# Patient Record
Sex: Male | Born: 1945
Health system: Southern US, Community
[De-identification: ages and names within clinical notes are randomized; demographics above are authoritative.]

## PROBLEM LIST (undated history)

## (undated) DIAGNOSIS — M4800 Spinal stenosis, site unspecified: Secondary | ICD-10-CM

## (undated) DIAGNOSIS — E785 Hyperlipidemia, unspecified: Secondary | ICD-10-CM

## (undated) DIAGNOSIS — M5136 Other intervertebral disc degeneration, lumbar region: Secondary | ICD-10-CM

## (undated) DIAGNOSIS — E119 Type 2 diabetes mellitus without complications: Secondary | ICD-10-CM

## (undated) DIAGNOSIS — K603 Anal fistula, unspecified: Secondary | ICD-10-CM

## (undated) DIAGNOSIS — M810 Age-related osteoporosis without current pathological fracture: Secondary | ICD-10-CM

## (undated) DIAGNOSIS — D649 Anemia, unspecified: Secondary | ICD-10-CM

## (undated) DIAGNOSIS — Z8701 Personal history of pneumonia (recurrent): Secondary | ICD-10-CM

## (undated) DIAGNOSIS — T4145XA Adverse effect of unspecified anesthetic, initial encounter: Secondary | ICD-10-CM

## (undated) DIAGNOSIS — M419 Scoliosis, unspecified: Secondary | ICD-10-CM

## (undated) DIAGNOSIS — K589 Irritable bowel syndrome without diarrhea: Secondary | ICD-10-CM

## (undated) DIAGNOSIS — E039 Hypothyroidism, unspecified: Secondary | ICD-10-CM

## (undated) DIAGNOSIS — F419 Anxiety disorder, unspecified: Secondary | ICD-10-CM

## (undated) DIAGNOSIS — I10 Essential (primary) hypertension: Secondary | ICD-10-CM

## (undated) DIAGNOSIS — M109 Gout, unspecified: Secondary | ICD-10-CM

## (undated) DIAGNOSIS — I2510 Atherosclerotic heart disease of native coronary artery without angina pectoris: Secondary | ICD-10-CM

## (undated) DIAGNOSIS — F4310 Post-traumatic stress disorder, unspecified: Secondary | ICD-10-CM

## (undated) DIAGNOSIS — M1990 Unspecified osteoarthritis, unspecified site: Secondary | ICD-10-CM

## (undated) DIAGNOSIS — K219 Gastro-esophageal reflux disease without esophagitis: Secondary | ICD-10-CM

## (undated) DIAGNOSIS — I509 Heart failure, unspecified: Secondary | ICD-10-CM

## (undated) DIAGNOSIS — C801 Malignant (primary) neoplasm, unspecified: Secondary | ICD-10-CM

## (undated) DIAGNOSIS — K5792 Diverticulitis of intestine, part unspecified, without perforation or abscess without bleeding: Secondary | ICD-10-CM

## (undated) DIAGNOSIS — M51369 Other intervertebral disc degeneration, lumbar region without mention of lumbar back pain or lower extremity pain: Secondary | ICD-10-CM

## (undated) DIAGNOSIS — T8859XA Other complications of anesthesia, initial encounter: Secondary | ICD-10-CM

## (undated) HISTORY — PX: STERNAL WIRE REMOVAL: SHX2440

## (undated) HISTORY — DX: Irritable bowel syndrome, unspecified: K58.9

## (undated) HISTORY — PX: HAND SURGERY: SHX662

## (undated) HISTORY — DX: Unspecified osteoarthritis, unspecified site: M19.90

## (undated) HISTORY — DX: Anal fistula, unspecified: K60.30

## (undated) HISTORY — DX: Anal fistula: K60.3

## (undated) HISTORY — PX: CHOLECYSTECTOMY: SHX55

## (undated) HISTORY — PX: CERVICAL FUSION: SHX112

## (undated) HISTORY — PX: TREATMENT FISTULA ANAL: SUR1390

## (undated) HISTORY — DX: Gout, unspecified: M10.9

## (undated) HISTORY — DX: Gastro-esophageal reflux disease without esophagitis: K21.9

## (undated) HISTORY — PX: BLEPHAROPLASTY: SUR158

## (undated) HISTORY — PX: JOINT REPLACEMENT: SHX530

## (undated) HISTORY — DX: Essential (primary) hypertension: I10

## (undated) HISTORY — PX: APPENDECTOMY: SHX54

## (undated) HISTORY — DX: Anemia, unspecified: D64.9

## (undated) HISTORY — PX: CORONARY ARTERY BYPASS GRAFT: SHX141

## (undated) HISTORY — PX: PLACEMENT OF SETON: SHX6029

## (undated) HISTORY — DX: Hyperlipidemia, unspecified: E78.5

## (undated) HISTORY — PX: KNEE ARTHROSCOPY: SUR90

## (undated) HISTORY — DX: Atherosclerotic heart disease of native coronary artery without angina pectoris: I25.10

---

## 1997-06-18 ENCOUNTER — Ambulatory Visit (HOSPITAL_COMMUNITY): Admission: RE | Admit: 1997-06-18 | Discharge: 1997-06-18 | Payer: Self-pay | Admitting: *Deleted

## 1999-03-22 ENCOUNTER — Ambulatory Visit (HOSPITAL_COMMUNITY): Admission: RE | Admit: 1999-03-22 | Discharge: 1999-03-22 | Payer: Self-pay | Admitting: Neurosurgery

## 1999-04-13 ENCOUNTER — Ambulatory Visit (HOSPITAL_COMMUNITY): Admission: RE | Admit: 1999-04-13 | Discharge: 1999-04-13 | Payer: Self-pay | Admitting: Neurosurgery

## 1999-04-13 ENCOUNTER — Encounter: Payer: Self-pay | Admitting: Neurosurgery

## 1999-05-30 ENCOUNTER — Inpatient Hospital Stay (HOSPITAL_COMMUNITY): Admission: EM | Admit: 1999-05-30 | Discharge: 1999-05-31 | Payer: Self-pay | Admitting: Emergency Medicine

## 1999-05-30 ENCOUNTER — Encounter: Payer: Self-pay | Admitting: Emergency Medicine

## 1999-05-31 ENCOUNTER — Encounter: Payer: Self-pay | Admitting: *Deleted

## 1999-06-05 ENCOUNTER — Ambulatory Visit (HOSPITAL_COMMUNITY): Admission: RE | Admit: 1999-06-05 | Discharge: 1999-06-05 | Payer: Self-pay | Admitting: Cardiology

## 1999-07-02 ENCOUNTER — Encounter: Payer: Self-pay | Admitting: Surgery

## 1999-07-04 ENCOUNTER — Encounter: Payer: Self-pay | Admitting: Surgery

## 1999-07-04 ENCOUNTER — Inpatient Hospital Stay (HOSPITAL_COMMUNITY): Admission: RE | Admit: 1999-07-04 | Discharge: 1999-07-08 | Payer: Self-pay | Admitting: Surgery

## 1999-07-05 ENCOUNTER — Encounter: Payer: Self-pay | Admitting: Thoracic Surgery (Cardiothoracic Vascular Surgery)

## 1999-07-05 ENCOUNTER — Encounter: Payer: Self-pay | Admitting: Surgery

## 1999-07-06 ENCOUNTER — Encounter: Payer: Self-pay | Admitting: Thoracic Surgery (Cardiothoracic Vascular Surgery)

## 1999-08-12 ENCOUNTER — Encounter (HOSPITAL_COMMUNITY): Admission: RE | Admit: 1999-08-12 | Discharge: 1999-11-10 | Payer: Self-pay | Admitting: Cardiology

## 1999-09-30 ENCOUNTER — Encounter: Payer: Self-pay | Admitting: Surgery

## 1999-09-30 ENCOUNTER — Encounter: Admission: RE | Admit: 1999-09-30 | Discharge: 1999-09-30 | Payer: Self-pay | Admitting: Surgery

## 1999-11-11 ENCOUNTER — Encounter: Payer: Self-pay | Admitting: Surgery

## 1999-11-11 ENCOUNTER — Encounter: Admission: RE | Admit: 1999-11-11 | Discharge: 1999-11-11 | Payer: Self-pay | Admitting: Surgery

## 1999-11-14 ENCOUNTER — Encounter: Payer: Self-pay | Admitting: Surgery

## 1999-11-27 ENCOUNTER — Inpatient Hospital Stay (HOSPITAL_COMMUNITY): Admission: RE | Admit: 1999-11-27 | Discharge: 1999-11-30 | Payer: Self-pay | Admitting: Surgery

## 1999-11-27 ENCOUNTER — Encounter: Payer: Self-pay | Admitting: Surgery

## 1999-11-28 ENCOUNTER — Encounter: Payer: Self-pay | Admitting: Surgery

## 1999-11-29 ENCOUNTER — Encounter: Payer: Self-pay | Admitting: Surgery

## 1999-12-16 ENCOUNTER — Encounter: Payer: Self-pay | Admitting: Surgery

## 1999-12-16 ENCOUNTER — Encounter: Admission: RE | Admit: 1999-12-16 | Discharge: 1999-12-16 | Payer: Self-pay | Admitting: Surgery

## 2000-01-30 ENCOUNTER — Ambulatory Visit (HOSPITAL_COMMUNITY): Admission: RE | Admit: 2000-01-30 | Discharge: 2000-01-30 | Payer: Self-pay | Admitting: *Deleted

## 2000-09-03 ENCOUNTER — Ambulatory Visit (HOSPITAL_COMMUNITY): Admission: RE | Admit: 2000-09-03 | Discharge: 2000-09-03 | Payer: Self-pay | Admitting: *Deleted

## 2001-01-07 ENCOUNTER — Ambulatory Visit (HOSPITAL_COMMUNITY): Admission: RE | Admit: 2001-01-07 | Discharge: 2001-01-07 | Payer: Self-pay | Admitting: Orthopedic Surgery

## 2001-04-29 ENCOUNTER — Ambulatory Visit (HOSPITAL_COMMUNITY): Admission: RE | Admit: 2001-04-29 | Discharge: 2001-04-29 | Payer: Self-pay | Admitting: Cardiology

## 2001-04-29 ENCOUNTER — Encounter: Payer: Self-pay | Admitting: Cardiology

## 2002-05-18 ENCOUNTER — Encounter: Admission: RE | Admit: 2002-05-18 | Discharge: 2002-05-18 | Payer: Self-pay | Admitting: Family Medicine

## 2002-05-18 ENCOUNTER — Encounter: Payer: Self-pay | Admitting: Family Medicine

## 2002-11-23 ENCOUNTER — Encounter: Admission: RE | Admit: 2002-11-23 | Discharge: 2002-11-23 | Payer: Self-pay | Admitting: Family Medicine

## 2002-11-23 ENCOUNTER — Encounter: Payer: Self-pay | Admitting: Family Medicine

## 2004-06-13 ENCOUNTER — Encounter: Admission: RE | Admit: 2004-06-13 | Discharge: 2004-06-13 | Payer: Self-pay | Admitting: Internal Medicine

## 2004-10-16 ENCOUNTER — Encounter (INDEPENDENT_AMBULATORY_CARE_PROVIDER_SITE_OTHER): Payer: Self-pay | Admitting: *Deleted

## 2004-10-16 ENCOUNTER — Ambulatory Visit (HOSPITAL_COMMUNITY): Admission: RE | Admit: 2004-10-16 | Discharge: 2004-10-16 | Payer: Self-pay | Admitting: *Deleted

## 2006-05-07 ENCOUNTER — Emergency Department (HOSPITAL_COMMUNITY): Admission: EM | Admit: 2006-05-07 | Discharge: 2006-05-07 | Payer: Self-pay | Admitting: Emergency Medicine

## 2006-10-18 ENCOUNTER — Inpatient Hospital Stay (HOSPITAL_COMMUNITY): Admission: EM | Admit: 2006-10-18 | Discharge: 2006-10-21 | Payer: Self-pay | Admitting: Emergency Medicine

## 2007-02-04 ENCOUNTER — Ambulatory Visit (HOSPITAL_BASED_OUTPATIENT_CLINIC_OR_DEPARTMENT_OTHER): Admission: RE | Admit: 2007-02-04 | Discharge: 2007-02-04 | Payer: Self-pay | Admitting: General Surgery

## 2007-03-18 ENCOUNTER — Encounter: Admission: RE | Admit: 2007-03-18 | Discharge: 2007-03-18 | Payer: Self-pay | Admitting: General Surgery

## 2007-04-26 ENCOUNTER — Inpatient Hospital Stay (HOSPITAL_BASED_OUTPATIENT_CLINIC_OR_DEPARTMENT_OTHER): Admission: RE | Admit: 2007-04-26 | Discharge: 2007-04-26 | Payer: Self-pay | Admitting: Cardiology

## 2007-07-08 ENCOUNTER — Ambulatory Visit (HOSPITAL_BASED_OUTPATIENT_CLINIC_OR_DEPARTMENT_OTHER): Admission: RE | Admit: 2007-07-08 | Discharge: 2007-07-08 | Payer: Self-pay | Admitting: General Surgery

## 2008-12-04 ENCOUNTER — Encounter: Admission: RE | Admit: 2008-12-04 | Discharge: 2008-12-04 | Payer: Self-pay | Admitting: Neurosurgery

## 2009-03-01 ENCOUNTER — Ambulatory Visit (HOSPITAL_BASED_OUTPATIENT_CLINIC_OR_DEPARTMENT_OTHER): Admission: RE | Admit: 2009-03-01 | Discharge: 2009-03-01 | Payer: Self-pay | Admitting: Orthopedic Surgery

## 2009-07-13 ENCOUNTER — Encounter: Admission: RE | Admit: 2009-07-13 | Discharge: 2009-07-13 | Payer: Self-pay | Admitting: Orthopedic Surgery

## 2009-08-07 ENCOUNTER — Inpatient Hospital Stay (HOSPITAL_COMMUNITY): Admission: RE | Admit: 2009-08-07 | Discharge: 2009-08-10 | Payer: Self-pay | Admitting: Orthopedic Surgery

## 2009-09-14 ENCOUNTER — Encounter (INDEPENDENT_AMBULATORY_CARE_PROVIDER_SITE_OTHER): Payer: Self-pay | Admitting: Orthopedic Surgery

## 2009-09-14 ENCOUNTER — Ambulatory Visit (HOSPITAL_COMMUNITY): Admission: RE | Admit: 2009-09-14 | Discharge: 2009-09-14 | Payer: Self-pay | Admitting: Orthopedic Surgery

## 2009-09-14 ENCOUNTER — Ambulatory Visit: Payer: Self-pay | Admitting: Vascular Surgery

## 2010-04-10 ENCOUNTER — Ambulatory Visit (INDEPENDENT_AMBULATORY_CARE_PROVIDER_SITE_OTHER): Payer: Medicare Other | Admitting: Cardiology

## 2010-04-10 DIAGNOSIS — I10 Essential (primary) hypertension: Secondary | ICD-10-CM

## 2010-04-25 ENCOUNTER — Ambulatory Visit: Payer: Self-pay | Admitting: Cardiology

## 2010-05-04 LAB — BASIC METABOLIC PANEL
BUN: 14 mg/dL (ref 6–23)
CO2: 28 mEq/L (ref 19–32)
Calcium: 9.1 mg/dL (ref 8.4–10.5)
GFR calc Af Amer: >60 mL/min (ref >60)
Glucose, Bld: 116 mg/dL — ABNORMAL HIGH (ref 70–99)

## 2010-05-04 LAB — POCT HEMOGLOBIN-HEMACUE: Hemoglobin: 13.4 g/dL (ref 13.0–17.0)

## 2010-05-05 LAB — CBC
HCT: 43.3 % (ref 39.0–52.0)
Hemoglobin: 12 g/dL — ABNORMAL LOW (ref 13.0–17.0)
MCH: 30.1 pg (ref 26.0–34.0)
MCHC: 33.2 g/dL (ref 30.0–36.0)
MCHC: 33.9 g/dL (ref 30.0–36.0)
MCV: 88.7 fL (ref 78.0–100.0)
MCV: 88.8 fL (ref 78.0–100.0)
MCV: 89.6 fL (ref 78.0–100.0)
Platelets: 248 10*3/uL (ref 150–400)
Platelets: 256 10*3/uL (ref 150–400)
Platelets: 294 10*3/uL (ref 150–400)
Platelets: 305 10*3/uL (ref 150–400)
RBC: 3.98 MIL/uL — ABNORMAL LOW (ref 4.22–5.81)
RBC: 4.08 MIL/uL — ABNORMAL LOW (ref 4.22–5.81)
RBC: 4.09 MIL/uL — ABNORMAL LOW (ref 4.22–5.81)
RDW: 16.2 % — ABNORMAL HIGH (ref 11.5–15.5)
RDW: 16.4 % — ABNORMAL HIGH (ref 11.5–15.5)
WBC: 11.7 10*3/uL — ABNORMAL HIGH (ref 4.0–10.5)

## 2010-05-05 LAB — DIFFERENTIAL
Basophils Absolute: 0.1 10*3/uL (ref 0.0–0.1)
Basophils Relative: 1 % (ref 0–1)
Monocytes Absolute: 0.9 10*3/uL (ref 0.1–1.0)
Monocytes Relative: 9 % (ref 3–12)
Neutro Abs: 5.7 10*3/uL (ref 1.7–7.7)

## 2010-05-05 LAB — BASIC METABOLIC PANEL
BUN: 4 mg/dL — ABNORMAL LOW (ref 6–23)
CO2: 29 mEq/L (ref 19–32)
CO2: 34 mEq/L — ABNORMAL HIGH (ref 19–32)
Calcium: 9 mg/dL (ref 8.4–10.5)
Calcium: 9.2 mg/dL (ref 8.4–10.5)
Chloride: 90 mEq/L — ABNORMAL LOW (ref 96–112)
Chloride: 92 mEq/L — ABNORMAL LOW (ref 96–112)
Creatinine, Ser: 1.22 mg/dL (ref 0.4–1.5)
GFR calc Af Amer: >60 mL/min (ref >60)
GFR calc Af Amer: >60 mL/min (ref >60)
GFR calc Af Amer: >60 mL/min (ref >60)
GFR calc non Af Amer: 57 mL/min — ABNORMAL LOW (ref >60)
GFR calc non Af Amer: 60 mL/min — ABNORMAL LOW (ref >60)
Glucose, Bld: 124 mg/dL — ABNORMAL HIGH (ref 70–99)
Potassium: 4.5 mEq/L (ref 3.5–5.1)
Sodium: 138 mEq/L (ref 135–145)

## 2010-05-05 LAB — PROTIME-INR
INR: 0.93 (ref 0.00–1.49)
INR: 1.16 (ref 0.00–1.49)
Prothrombin Time: 12.4 seconds (ref 11.6–15.2)

## 2010-05-05 LAB — COMPREHENSIVE METABOLIC PANEL
ALT: 22 U/L (ref 0–53)
Calcium: 9.6 mg/dL (ref 8.4–10.5)
Creatinine, Ser: 1.21 mg/dL (ref 0.4–1.5)
GFR calc Af Amer: >60 mL/min (ref >60)
GFR calc non Af Amer: >60 mL/min (ref >60)
Glucose, Bld: 129 mg/dL — ABNORMAL HIGH (ref 70–99)
Potassium: 3.9 mEq/L (ref 3.5–5.1)
Sodium: 134 mEq/L — ABNORMAL LOW (ref 135–145)
Total Protein: 6.8 g/dL (ref 6.0–8.3)

## 2010-05-05 LAB — URINE MICROSCOPIC-ADD ON

## 2010-05-05 LAB — URINALYSIS, ROUTINE W REFLEX MICROSCOPIC
Glucose, UA: NEGATIVE mg/dL
Hgb urine dipstick: NEGATIVE
Leukocytes, UA: NEGATIVE
Specific Gravity, Urine: 1.013 (ref 1.005–1.030)
pH: 6.5 (ref 5.0–8.0)

## 2010-05-19 ENCOUNTER — Other Ambulatory Visit: Payer: Self-pay | Admitting: Neurosurgery

## 2010-05-19 DIAGNOSIS — M47816 Spondylosis without myelopathy or radiculopathy, lumbar region: Secondary | ICD-10-CM

## 2010-05-20 ENCOUNTER — Ambulatory Visit
Admission: RE | Admit: 2010-05-20 | Discharge: 2010-05-20 | Disposition: A | Discharge status: Home / Self Care | Payer: Medicare Other | Source: Ambulatory Visit | Attending: Neurosurgery | Admitting: Neurosurgery

## 2010-05-20 DIAGNOSIS — M47816 Spondylosis without myelopathy or radiculopathy, lumbar region: Secondary | ICD-10-CM

## 2010-06-03 ENCOUNTER — Encounter (HOSPITAL_COMMUNITY): Payer: Medicare Other

## 2010-07-01 NOTE — Op Note (Signed)
NAME:  [name] [name], [name] [name]                  ACCOUNT NO.:  [id number]   MEDICAL RECORD NO.:  [id number]          PATIENT TYPE:  AMB   LOCATION:  [facility]                         FACILITY:  [facility]   PHYSICIAN:  Angelia Mould. Derrell Lolling, M.D.DATE OF BIRTH:  [birth year]   DATE OF PROCEDURE:  07/08/2007  DATE OF DISCHARGE:                               OPERATIVE REPORT   PREOPERATIVE DIAGNOSIS:  Recurrent fistula in ano.   POSTOPERATIVE DIAGNOSIS:  Recurrent fistula in ano.   OPERATION PERFORMED:  1. Examination under anesthesia.  2. Partial fistulotomy, left lateral.   SURGEON:  Angelia Mould. Derrell Lolling, M.D.   OPERATIVE INDICATIONS:  This is a 65 year old white man with multiple  medical problems.  He has had 2 or 3 significant perirectal abscesses of  the left side.  After the most recent one he developed a persistent  fistula in the left lateral position.  He was taken to the operating  room and this was found to track fairly high up around the external  sphincter muscles and so I partially divided the fistula and placed a  seton and left that in place for about 6 weeks.  We then removed the  seton and he transiently healed but will intermittently open up an  external fistula in the left lateral position at the most lateral part  of the incision.  In the office for the past 2 visits I have been able  to pass a probe from the external opening up posteriorly and in  curvilinear fashion.  It appears to go back toward the posterior midline  but appeared to be relatively superficial.  I advised him that we should  examine this in the operating room and if is fairly superficial and does  not divide the sphincter muscles, we should try to fully divide this in  hopes of getting pertinent healing.  He has had a GI workup and there is  no evidence of any inflammatory bowel disease.  He is brought to the  operating room electively.   OPERATIVE TECHNIQUE:  Following the induction of general endotracheal  anesthesia  the patient was placed in the dorsal lithotomy position.  Intravenous antibiotics were given.  The patient was identified as to  correct patient and correct procedure.  The perianal area was prepped  and draped in a sterile fashion.   I examined the external anatomy.  There was a transverse incision, left  lateral.  There was no active drainage anywhere.  There was a small  punctate area where the external opening was.   I did a digital rectal exam and felt some chronic scar tissue and  granulation tissue in the posterior midline, presumably from his  previous fistulotomy site.  I inserted the anoscope.  I did not see any  internal opening or any purulence.   I then passed a small blunt-tipped rectal probe through the external  opening and the only tract I could find actually went in a curvilinear  fashion and went very deep and curved posteriorly and then back toward  the wall of the rectum well above all  of the sphincter muscles.  I could  not find an internal opening.  I tried for 10 or 15 minutes to see if I  could find a tract.  I opened the skin somewhat and debrided all of the  external tissues to get good drainage.  There was no purulence or  abscess.  I tried to pass a larger probe and could not pass this and it  would not find the fistula tract.  There was no superficial fistula  tract.  I felt that it would be unwise to blindly place another seton.  I simply controlled all the bleeding and irrigated the wound and placed  a piece of Surgicel gauze.  I also debrided some of the chronic  granulation tissue in the posterior midline at the anal canal at the  dentate line.  I once again looked for any bleeding internally and there  was none.  I looked for purulent drainage internally, could find no  internal drainage site.  I felt that this was all that was safe to do at  this time and placed external bandages.  The patient was taken to the  recovery room in stable condition.   Estimated blood loss was about 20  mL.  Complications:  None.  Sponge, needle and instrument counts were  correct.      Angelia Mould. Derrell Lolling, M.D.  Electronically Signed     HMI/MEDQ  D:  07/08/2007  T:  07/08/2007  Job:  [id number]

## 2010-07-01 NOTE — Op Note (Signed)
NAME:  [name] [name], [name] [name]                  ACCOUNT NO.:  [id number]   MEDICAL RECORD NO.:  [id number]          PATIENT TYPE:  AMB   LOCATION:  [facility]                         FACILITY:  [facility]   PHYSICIAN:  Angelia Mould. Derrell Lolling, M.D.DATE OF BIRTH:  [birth year]   DATE OF PROCEDURE:  02/04/2007  DATE OF DISCHARGE:                               OPERATIVE REPORT   PREOPERATIVE DIAGNOSIS:  Fistula in ano.   POSTOPERATIVE DIAGNOSIS:  Fistula in ano, suspect extra sphincteric.   OPERATION PERFORMED:  Anoscopy, partial division fistula in anal,  placement of seton.   SURGEON:  Angelia Mould. Derrell Lolling, M.D.   OPERATIVE INDICATIONS:  This is a very pleasant 65 year old white man  who has had 2 perirectal abscesses in the past.  The last one was a very  large left-sided ischial rectal abscess on October 18, 2006, that  required a 3 or 4 day hospitalization because of the extent of the  abscess and the extent of his pain.  This healed except he has been left  with a draining sinus on the left lateral area.  In the office I could  place a probe at least 4 cm or more back in to this.  There was no  cellulitis.  He is brought to the operating room for examination under  anesthesia and hopefully repair of his fistula in ano.   OPERATIVE TECHNIQUE:  Following the induction of general endotracheal  anesthesia the patient was placed in the dorsal lithotomy position.  Perianal area was prepped and draped in a sterile fashion.  Anoscopy was  performed and I saw no active inflammatory process in the anal canal or  distal rectum.  I saw no purulent drainage.  There appeared to be a  slightly reddened area in the posterior midline about 1.5 cm or more  above the dentate line.  I then examined the transverse incision on the  left side.  There was a small punctate opening.  I passed a probe into  this and it very easily fell into a fistula track about 6 or 7 cm but  this went deep up into the ischial rectal space and then  seemed to come  back to the posterior midline about 1.5 to 2 cm above the dentate line.  I examined this for a long time to make sure that there were no other  tracts.  I opened this up part way including a little bit of the  internal sphincter but it was so deep that I could not really open all  of this up and so I debrided as much as I could and then passed a zero  Surgilon seton around the fistula tract.  I divided the mucosa a little  bit.  I tied the seton down loosely and left the string long so that we  could retrieve it later in the office.  There was no purulence or  abscess.  Hemostasis was excellent and achieved with electrocautery.  We  placed a small piece of Surgicel 20 mL.  Complications none.  Sponge and  instrument counts were correct.  Angelia Mould. Derrell Lolling, M.D.  Electronically Signed    HMI/MEDQ  D:  02/04/2007  T:  02/05/2007  Job:  [id number]   cc:   Juline Patch, M.D.  Fax: [phone]

## 2010-07-01 NOTE — H&P (Signed)
NAME:  [name] [name], [name] [name]                  ACCOUNT NO.:  [id number]   MEDICAL RECORD NO.:  [id number]          PATIENT TYPE:  INP   LOCATION:  0098                         FACILITY:  [facility]   PHYSICIAN:  Angelia Mould. Derrell Lolling, M.D.DATE OF BIRTH:  1945-04-17   DATE OF ADMISSION:  10/18/2006  DATE OF DISCHARGE:                              HISTORY & PHYSICAL   CHIEF COMPLAINT:  Progressive pain and swelling in the perirectal area.   HISTORY OF PRESENT ILLNESS:  This is a 65 year old white man who  underwent incision and drainage of a left-sided perirectal abscess in  the emergency room by Dr. Ovidio Kin in March of this year.  He saw  Dr. Dominga Ferry in the office a few days later, and according to him had  another drainage procedure.  This then resolved and he has felt fine.  Four days ago he began feeling some tingling and then has had  progressive pain and swelling.  He has not had any drainage.  No real  fever or chills.  He called on the phone today and I asked him to come  to the emergency room.   PAST HISTORY:  Coronary artery bypass grafting 2001.  Rewiring of his  sternum in October of 2001.  Cervical discectomy in the 1990s by Dr.  Channing Mutters.  Appendectomy.  Laparoscopic cholecystectomy Dr. Chelsea Aus.  Hemorrhoidectomy.  Knee arthroscopy.  Hand surgery for cellulitis.  Incision and drainage perirectal abscess March of 2008.  He has coronary  artery disease, degenerative joint disease, gout, and gastroesophageal  reflux disease.   CURRENT MEDICATIONS:  Nexium 40 mg a day, allopurinol 300 mg a day,  Cozaar 50 mg a day, vitamin B6.  Vitamin B12.  Folic acid.  Magnesium  oxide.  Omega III.  Iron.  Toprol 50 milligrams a day.  Niaspan 500 mg a  day.  Aspirin 325 mg a day.  Hydrocodone p.r.n.   ALLERGIES:  Drug allergies none known.   SOCIAL HISTORY:  He lives in [place].  His wife Jonaven Hilgers is an OR nurse at [employer]  They have two children.  Denies  tobacco.  Drinks a six-pack of beer a day.   FAMILY HISTORY:  Mother died age 73 of colon cancer.  Father died  unknown cause.   REVIEW OF SYMPTOMS:  15 system review of systems is performed and  noncontributory, except as described above.   PHYSICAL EXAMINATION:  GENERAL:  Pleasant middle-aged gentleman in  moderate distress.  He has to lie on his side because of pain.  VITAL SIGNS:  Are pending at this time.  HEENT:  Eyes, sclerae clear.  Extraocular movements intact.  Ears, nose,  mouth and throat, nose, tongue and oropharynx are without gross lesions.  NECK:  Supple, nontender.  I do not feel any adenopathy.  I do not feel  any jugular venous distention.  He has a well-healed transverse scar in  the left lower neck.  LUNGS:  Clear to auscultation.  No chest wall tenderness.  Well-healed  sternotomy scar.  HEART:  Regular rate and rhythm.  No murmur.  Radial and femoral pulses  are palpable.  ABDOMEN:  Soft.  Nontender.  Well-healed trocar sites from his  cholecystectomy.  Liver and spleen not enlarged.  GENITOURINARY:  Penis, scrotum, testes and inguinal areas feel normal.  RECTAL:  He has a large area of induration in the left perirectal area.  I can see a radially oriented scar in the left lateral position which  appears well healed.  The area of induration is quite large and is 15 cm  x 10 cm.  I do not feel a specific area of pointing or fluctuance but I  suspect that he has an abscess deep inside.  The right side looks  normal.  There is no drainage.  There is no skin necrosis.  EXTREMITIES:  Moves all four extremities well without pain or deformity.  NEUROLOGIC:  No gross motor sensory deficits.   ASSESSMENT:  1. Recurrent perirectal abscess, left lateral.  2. Coronary artery disease, status post coronary bypass grafting.  3. Degenerative joint disease.  4. Gout.  5. Gastroesophageal reflux disease.   PLAN:  1. The patient will be taken to operating room for  examination under      anesthesia and incision and drainage of his perirectal abscess.  2. I have discussed the indication and details of surgery with him.      Risks and complications have been outlined, including but not      limited to bleeding, infection, sphincter damage with temporary or      permanent incontinence, recurrence of the infection, fistula      formation requiring further surgery, cardiac complications.  He      seems to understand these issues well.  At this time all of his      questions are answered. He is informed of his plan.      Angelia Mould. Derrell Lolling, M.D.  Electronically Signed     HMI/MEDQ  D:  10/18/2006  T:  10/18/2006  Job:  [id number]   cc:   Juline Patch, M.D.  Fax: [phone]   Colleen Can. Deborah Chalk, M.D.  Fax: [phone]

## 2010-07-01 NOTE — Op Note (Signed)
NAME:  [name] [name], [name] [name]                  ACCOUNT NO.:  [id number]   MEDICAL RECORD NO.:  [id number]        PATIENT TYPE:  LINP   LOCATION:                               FACILITY:  [facility]   PHYSICIAN:  Angelia Mould. Derrell Lolling, M.D.DATE OF BIRTH:  Dec 06, 1945   DATE OF PROCEDURE:  10/18/2006  DATE OF DISCHARGE:                               OPERATIVE REPORT   PREOPERATIVE DIAGNOSIS:  Recurrent perirectal abscess.   POSTOPERATIVE DIAGNOSIS:  Recurrent, ischiorectal abscess.   OPERATION PERFORMED:  1. Examination under anesthesia.  2. Anoscopy.  3. Incision and drainage of ischiorectal abscess.   SURGEON:  Dr. Claud Kelp   OPERATIVE INDICATIONS:  This is a 65 year old white man with a history  of coronary artery disease and hypertension and gout.  He had a left-  sided perirectal abscess drained in the emergency room in March of this  year that healed.  He now presents with a 4-day history of progressive  pain and swelling in his left perianal area.  There has been no  drainage.   On exam he has a huge perirectal abscess with induration palpable about  10 cm x 15 cm.  There is no active drainage or skin necrosis.  He has  undergone IV hydration, intravenous antibiotics is brought promptly to  the operating room.   OPERATIVE FINDINGS:  The patient had a huge perirectal abscess in the  left lateral and left posterior position.  It was also extended  superiorly up along the wall of the rectum and into the ischiorectal  space.  The drainage was tan, creamy purulent material with an odor of  E. coli.  There was no evidence of fasciitis or necrosis.  The patient  bled a little bit more than usual, presumably because he takes aspirin  on a daily basis.  There was no evidence of the infection that was  extending up into the anterior perineum, and no evidence that it  extended across the midline.  There was no evidence of abscess extending  posteriorly into the pilonidal area.  The sphincter  mechanism appeared  to be intact.  On anoscopy he appeared to have a small tear consistent  with a chronic fissure in the left posterior position.  There was no  purulent drainage or fistula identified.   OPERATIVE TECHNIQUE:  Following the induction of general endotracheal  anesthesia the patient was placed in yellow fin stirrups in a lithotomy  position.  The perianal area was prepped and draped in a sterile  fashion.  I performed a digital rectal exam and could feel fullness  along the left lateral wall of the rectum up inside for about 3 inches  or more.  I did an anoscopy and saw a small fissure in the left  posterior near the midline but I could not see any purulent drainage,  did not see any evidence of any internal fistula opening.   I then took a syringe and a large needle and went into the left lateral  position at 3 o'clock and I aspirated about 6 mL of creamy tan purulent  fluid and capped with a syringe and sent it to the lab for Gram stain,  aerobic and anaerobic culture.  I then made a transverse elliptical  incision excising some skin and took this down into the abscess cavity  and drained it thoroughly.  I then irrigated the cavity thoroughly.  I  found it extended posteriorly to near the midline where the skin was  fairly thin.  I made another radially oriented incision in the left  posterior position of the skin near the midline about 1.5 cm in length.  I then used a second syringe irrigated out the abscess cavity on three  or four occasions making sure that I got all the way up into the highest  recesses of the cavity.  felt that I could feel all the different areas  of the abscess cavity and that I had completely drain all the  loculations.  I packed the wound for about 5 or 10 minutes a couple of  times.  Hemostasis was very good and achieved with electrocautery.  I  felt the bleeding was under control.  I placed a 1/2-inch Penrose drain  through the incision at  the 3 o'clock position and another 1/2-inch  Penrose drain through the incision at the 6 o'clock position.  These  were sewed in place with nylon sutures.  The drain in the lateral, 3  o'clock position was much longer and I placed it up along the  ischiorectal cavity up along the rectal wall.  I observed this for about  3 or 4 minutes and there really was not any significant bleeding.  I  then placed a saline moistened sponge on the open wound at the 3 o'clock  position and we then covered everything with bulky fluffy dry dressings  and fishnet panties.   The patient tolerated the procedure well and was taken to recovery in  stable condition.   ESTIMATED BLOOD LOSS:  Was 50-75 mL.   COMPLICATIONS:  None.   COUNTS:  Sponge and instrument counts were correct.      Angelia Mould. Derrell Lolling, M.D.  Electronically Signed     HMI/MEDQ  D:  10/18/2006  T:  10/18/2006  Job:  [id number]   cc:   Juline Patch, M.D.  Fax: [phone]   Colleen Can. Deborah Chalk, M.D.  Fax: [phone]

## 2010-07-01 NOTE — Cardiovascular Report (Signed)
NAME:  Denker, Romy                  ACCOUNT NO.:  [id number]   MEDICAL RECORD NO.:  [id number]          PATIENT TYPE:  OIB   LOCATION:  1961                         FACILITY:  [facility]   PHYSICIAN:  Colleen Can. Deborah Chalk, M.D.DATE OF BIRTH:  14-Dec-1945   DATE OF PROCEDURE:  04/25/2007  DATE OF DISCHARGE:                            CARDIAC CATHETERIZATION   PROCEDURE:  Left heart catheterization with selective coronary  angiography, left ventricular angiography, saphenous vein graft  angiography x2, angiography of the left internal mammary artery.   TYPE AND SITE OF ENTRY:  Intra-percutaneous right femoral artery.   CATHETERS:  4-French, four curved Judkins' left coronary catheter, 4-  [place] 3-D RCA, 4-French bypass graft catheter, 4-French left internal  mammary graft catheter.   CONTRAST MATERIAL:  Omnipaque.   MEDICATIONS:  Given prior to procedure:  Valium 10 mg.   MEDICATIONS:  Given during procedure Versed 2 mg IV.   COMMENTS:  The patient tolerated the procedure well.   HEMODYNAMIC DATA:  The aortic pressure was 143/6-14.  The aortic  pressure is 142/80.  There is no aortic valve gradient noted on  pullback.   ANGIOGRAPHIC DATA:  1. Left main coronary artery is normal.  2. Left anterior descending had a total occlusion, after the first      diagonal vessel.  There there was moderate 50% disease, just      proximal to this small diagonal.  3. Left circumflex has a totally occluded first obtuse marginal.      There is 90% stenosis plus into a small distal continuation branch      of the left circumflex.   Right coronary artery:  Right coronary artery is totally occluded, after  the acute margin.  There is scanty bridging collaterals distally.   Saphenous vein graft to the right coronary artery is patent.  There is a  proximal valve with good insertion distally.  The very distal right  coronary artery is occluded after the crux.  It is it is filled by  retrograde  collaterals.   Left internal mammary graft to the LAD is patent.  The LAD has excellent  flow.   Saphenous vein graft to obtuse marginal 1 and distal left circumflex are  widely patent with good insertion.  There is better insertion into the  distal circumflex.  However, there is collaterals to the distal right  coronary artery.   Left ventricular angiogram was performed in RAO position.  Overall  cardiac size and silhouette are normal.  The global ejection fraction is  55% to 60%.   Abdominal aortogram shows no aneurysm.  There was not selective flow  that can be evaluated into the renals or mesenteric vessels.   OVERALL IMPRESSION:  1. Patent LIMA to the LAD, patent saphenous vein graft to the obtuse      marginal 1 and patent limb to the distal left circumflex with a      patent saphenous vein graft to the distal right coronary artery.  2. Totally occluded native circulation.  3. Well preserved global left ventricular function.  4. No abdominal  aortic aneurysm noted.   DISCUSSION:  In light of these findings, it is felt to the Mr. Abernathy is  at low risk and has satisfactory revascularization.      Colleen Can. Deborah Chalk, M.D.  Electronically Signed     SNT/MEDQ  D:  04/25/2007  T:  04/26/2007  Job:  [id number]

## 2010-07-01 NOTE — H&P (Signed)
NAME:  [name] [name], [name] [name]                  ACCOUNT NO.:  [id number]   MEDICAL RECORD NO.:  [id number]          PATIENT TYPE:  OIB   LOCATION:  NA                           FACILITY:  [facility]   PHYSICIAN:  Colleen Can. Deborah Chalk, M.D.DATE OF BIRTH:  1945/12/02   DATE OF ADMISSION:  04/25/2007  DATE OF DISCHARGE:                              HISTORY & PHYSICAL   CHIEF COMPLAINT:  Chest discomfort.   HISTORY OF PRESENT ILLNESS:  [name] [name] is a 65 year old white male who  has a known history of ischemic heart disease.  He underwent coronary  artery bypass grafting for severe 3 vessel coronary disease back in May  2001.  Prior to that time he had basically had unremarkable Cardiolite  studies, and subsequently had infarction.  He has plans for upcoming  extensive rectal surgery.  He was seen in the office as a work-in  appointment at Cardiology on April 21, 2007.  Complained of being sick  for a week.  He has lots of vague discomforts.  These have included  chest pressure.  He has used some nitroglycerin with relief.  He has  become more short of breath.  He has had a cold and bronchitis.  Overall  he has a feeling of just not being well. He has not really been able to  exercise as much.  His symptoms have bene vague.  he is now referred for  cardiac catheterization.   PAST MEDICAL HISTORY:  1. He has known atherosclerotic cardiovascular disease.  2. He had prolonged episode of chest pain back in May of 2001 and had      inferior myocardial infarction in May of 2001.  Subsequently led to      coronary artery bypass grafting x4 with left internal mammary to      the LAD, sequential vein graft to the first OM of the left      circumflex and distal left circumflex, and a saphenous vein graft      to the posterior descending of the right coronary artery.  His last      Cardiolite study dates back to 2005.  3. Obesity.  4. Hypertension.  5. Chronic nocturia.  6. Chronic fatigue.  7. Dyslipidemia.  8.  Plantar fasciitis.  9. Previous cystoscopy.  10.Extensive, recurrent rectal abscess.  11.History of gout.  12.GERD.  13.DJD.   ALLERGIES:  NONE.   CURRENT MEDICATIONS:  1. Toprol 50 b.i.d.  2. Niaspan ER 500 a day.  3. Dyazide 37.5 a day.  4. Allopurinol 300 a day.  5. Nexium 40 a day.  6. Magnesium oxide 400 a day.  7. Aspirin daily.  8. Vitamin E daily.  9. B12 daily.  10.Folic acid daily.  11.Omega 3 daily.  12.Potassium over-the-counter daily.  13.Hydrocodone p.r.n.  14.Doxazosin 1 mg a day.  15.Lovaza 1 a day.   FAMILY HISTORY:  Father died at 33 due to an accident.  Mother died at  53 of colon cancer.   SOCIAL HISTORY:  He has had past tobacco abuse, but quit in 1978.  He  has  social alcohol use.  He lives at home with his wife.   OTHER SURGICAL HISTORY:  Two cervical fusions, history of left hand  Staph infection, cholecystectomy, appendectomy, and right knee  arthroscopy, and previous incision and drainages of recurrent rectal  abscesses.   REVIEW OF SYSTEMS:  As noted above, and otherwise unremarkable.   PHYSICAL EXAMINATION:  GENERAL:  On exam, he is in no acute distress.  VITAL SIGNS:  His weight is 227-1/2 pounds.  Blood pressure 132/90  sitting and 140/92 standing.  Heart rate is 72 and regular.  Respirations 18.  He is afebrile.  SKIN:  Warm and dry.  Color is unremarkable.  LUNGS:  Clear.  CARDIAC:  Shows regular rhythm.  ABDOMEN:  Soft.  EXTREMITIES:  Without edema.  NEUROLOGIC:  Shows no gross focal deficits.   PERTINENT LABS:  His EKG is normal.   Other labs are pending.   OVERALL IMPRESSION:  1. Recurrent chest pressure.  2. Known history of ischemic heart disease with removal coronary      artery bypass grafting.  3. Inferior myocardial infarction in the past.  4. Recurrent rectal abscesses.  5. Hypertension.  6. Hyperlipidemia.  7. History of gout.   PLAN:  Will proceed on with diagnostic cardiac catheterization.  The  risks,  procedure, and benefits have been explained and he is willing to  proceed on Monday, February 25, 2007.      Sharlee Blew, N.P.      Colleen Can. Deborah Chalk, M.D.  Electronically Signed    LC/MEDQ  D:  04/25/2007  T:  04/25/2007  Job:  [id number]

## 2010-07-04 NOTE — H&P (Signed)
[place]. [facility]  Patient:    [name] [name]                         MRN: [id number] Adm. Date:  [id number] Attending:  Meade Maw A                         History and Physical  REASON FOR ADMISSION:  Chest pain.  HISTORY:  [name] [name] is a 65 year old gentleman who presented to the urgent care triage center today with complaints of prolonged episode of chest pain. The chest pain had been present for more than 36 hours.  At the initial onset of the chest pain it was described as a 10/10 substernal chest pain with some left ear pain.  There was associated nausea, vomiting, and shortness of breath.  There was no diaphoresis noted.  The patient relates he has had chest pain of similar nature for approximately one year.  He had a stress Cardiolite in September 2000 which was reportedly negative as well as a treadmill test in March 2001 which was reportedly negative.  The result of the tests are not currently available.  His coronary risk factors are significant for male sex, hypertension, positive family history, and homocystinuria.  His cholesterol profile is not known.  There is no history of tobacco abuse or diabetes.  PAST MEDICAL HISTORY:  Significant for gout, homocystinuria, hypertension, and chronic back pain.  PAST SURGICAL HISTORY:  Significant for cholecystectomy, appendectomy, disk fusion x 2.  MEDICATIONS: 1. Allopurinol. 2. Celebrex 200 mg p.o. q.d. 3. Folic acid dose unknown. 4. Vitamin B6 dose unknown.  ALLERGIES:  No known drug allergies.  REVIEW OF SYSTEMS:  No history of palpitations.  No orthopnea.  No PND.  No pedal edema.  No change in bowel or bladder habits.  No bright red blood per rectum.  No black dark stools.  There has been no fevers, no chills, no cough.  SOCIAL HISTORY:  He is married.  He has two children ages 70 and 80.  Works for [employer] in [employer], clinical.  Relates he has a high stress job.  No history of tobacco  use.  Social alcohol only.  PHYSICAL EXAMINATION:  GENERAL:  Middle-aged male currently in no distress.  His pain has resolved with treatment.  VITAL SIGNS:  Blood pressure 147/92.  Heart rate 70.  He is afebrile.  O2 saturation 95% on room air.  HEENT:  Unremarkable.  NECK:  There is no neck vein distention noted.  No carotid bruits are noted.  PULMONARY:  Breath sounds are equal and clear to auscultation.  CARDIOVASCULAR:  Normal S1, normal S2.  Regular rate and rhythm.  PMI is within normal limits.  ABDOMEN:  Soft and nontender.  There are no bruits noted.  No unusual pulsations are noted.  EXTREMITIES:  Distal pulses are equal and palpable.  SKIN:  Warm and dry.  NEUROLOGIC:  Nonfocal.  Motor 5/5 throughout.  LABORATORIES:  Hemoglobin 15.6, platelet count 244, white count 11.8.  Sodium 133, potassium 3.5, BUN 13, creatinine 1.3, total bilirubin 0.5, alkaline phosphatase ______, SGOT 77.  His second set of CK is 440.  His first set of CK was 540.  His initial CK-MB were elevated at 47.  Troponin I is pending.  His ECG reveals a sinus rhythm.  There is an intraventricular conduction delay consistent with right bundle branch block.  There is no  acute ischemic changes noted.  IMPRESSION: 1. Prolonged chest pain relieved with nitrates and heparin.  Cardiac enzymes    are positive.  Patient has a non-Q-myocardial infarction about laboratory    data.  He currently is chest pain-free.  Will admit and continue with    heparin drip and IV nitroglycerin.  Plans are to perform left heart    catheterization in 48 hours if the patient remains pain-free.  Should he    have acute onset of pain he will be taken emergently for left heart    catheterization. 2. Hypertension.  He will be started on Toprol 50 mg p.o. q.12. 3. Homocystinuria.  Will continue with his B6 and folic acid. DD:  05/30/99 TD:  05/30/99 Job: [id number]

## 2010-07-04 NOTE — Op Note (Signed)
NAME:  [name] [name], [name] [name]                  ACCOUNT NO.:  [id number]   MEDICAL RECORD NO.:  [id number]          PATIENT TYPE:  AMB   LOCATION:  ENDO                         FACILITY:  [facility]   PHYSICIAN:  Georgiana Spinner, M.D.    DATE OF BIRTH:  10-01-1945   DATE OF PROCEDURE:  10/16/2004  DATE OF DISCHARGE:                                 OPERATIVE REPORT   PROCEDURE:  Endoscopy.   ENDOSCOPIST:  Georgiana Spinner, M.D.   DESCRIPTION OF PROCEDURE:  The patient sedated in the lateral decubitus  position.  The scope was inserted in the mouth.  __________ appeared normal  until we reached distal esophagus and there were small punctate areas of the  normal mucosa consistent possibly with Barrett's esophagus. This was  photographed and we attempted to biopsy these areas. We entered into the  stomach, fundus, body, and duodenal bulb, second portion duodenum.  From  this point, the endoscope was slowly withdrawn taking circumferential views  of duodenal mucosa until the endoscope had been pulled back through the  stomach and placed in retroflexion. The endoscope was straightened and  withdrawn taking circumferential.__________ and the fundus of the stomach  which appeared somewhat edematous. This was photographed and biopsied. The  endoscope was withdrawn into the distal esophagus __________. The patient's  vital signs basically remained stable. The patient tolerated procedure well  without complications.   FINDINGS:  Changes of edema of the gastric fundus biopsied with a question  of Barrett's esophagus.   ASSESSMENT:  1.  Await biopsy reports. The patient will call for results and follow up      with me as an outpatient.  2. Proceed to colonoscopy.           ______________________________  Georgiana Spinner, M.D.     GMO/MEDQ  D:  10/16/2004  T:  10/16/2004  Job:  [id number]

## 2010-07-04 NOTE — Op Note (Signed)
[place]. [facility]  Patient:    [name] [name], KNUTZEN                         MRN: [id number] Proc. Date: 11/27/99 Adm. Date:  [id number] Disc. 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  OPERATION PERFORMED:  Rewiring of sternum.  SURGEON:  Dr. Evelene Croon.  ASSISTANT:  Gershon Crane, P.A.-C.  ANESTHESIA:  General endotracheal.  CLINICAL HISTORY:  This patient is a 66 year old gentleman who is status post coronary artery bypass surgery x 4 on Jul 04, 1999. He had an unremarkable postop course and I saw him initially on July 29, 1999 postoperatively he was doing well. I saw him again on September 30, 1999 at which time he was reporting a clicking sensation in his chest particularly in the lower sternal area. We elected to continue observing this. He returned on September 25 reporting that the clicking in his sternum was getting worse and that now the entire sternum appeared to move whenever he lifted something or bent over. This was causing him significant discomfort and also preventing him from returning to work. I felt the best treatment was to take him to the operating room and rewire the sternum. A chest x-ray showed that all the sternal wires were intact and in the normal position. I discussed the operative procedure with him including alternatives, benefits, and risks including bleeding, possible blood transfusion and injury to the mediastinal structures, repeat nonunion of the sternum after this procedure, and he understood and agreed to proceed.  DESCRIPTION OF PROCEDURE:  The patient was taken to the operating room and placed on the table in supine position. After induction of general endotracheal anesthesia, a Foley  catheter was placed in the bladder using sterile technique. Then the chest, abdomen, and neck were prepped with Betadine solution and draped in the usual sterile manner. The chest was reentered through the previous median sternotomy incision. The old sternal wires were removed. It was obvious that the sternum had a nonunion with the sternal edges separated by a few millimeters and joined by fibrous tissue anteriorly and posteriorly. This fibrous tissue was incised and the sternal edges retracted with bone hooks. Dissection was performed just beneath the sternum to separate the mediastinal tissues from the back of the sternum. After this was performed on both sides, the fibrous tissue that was joining the sternum was excised. The bone was debrided with curette and appeared viable and bled well. There was no sign of infection. The chest was then irrigated with the vancomycin saline solution. There was complete hemostasis. Then the sternum was reapproximated using #6 stainless steel wires for the manubrium and stainless steel sternal bands for the main body of the sternum. One #6 stainless steel wire was placed at the bottom of the sternum just above the xiphoid process. After all these wires and bands were tightened, the sternum appeared completely stable. There was good hemostasis. Two chest tubes were used with 1 left pleural space which was opened and 1 was placed beneath the sternum. The fascia was closed with continuous #1 Vicryl suture. The subcutaneous tissue was closed with continuous 2-0  Vicryl and the skin with 3-0 Vicryl subcuticular closure. Sponge, needle and instrument counts were correct. A dry sterile dressing was applied over the incision and around the chest tube which was hooked to Pleuravac suction. The patient remained hemodynamically stable and was awakened and extubated and transported to the surgical intensive care unit in guarded but stable condition. DD:   11/27/99 TD:  11/29/99 Job: [id number]

## 2010-07-04 NOTE — Discharge Summary (Signed)
[place]. [facility]  Patient:    [name] [name], [name] [name]                         MRN: [id number] Adm. Date:  [id number] Disc. Date: [id number] Attending:  Cleatrice Burke Dictator:   Marlowe Kays, P.A.                           Discharge Summary  DISCHARGE DIAGNOSES: 1. Sternal nonunion status post coronary artery bypass graft surgery. 2. Elevated white blood cell count, improved.  PROCEDURE:  Rewiring of sternum on November 27, 1999 by Dr. Laneta Simmers.  DISCHARGE MEDICATIONS: 1. Celebrex 200 mg p.o. b.i.d. 2. Allopurinol 300 mg p.o. q.d. 3. Protonix 40 mg p.o. q.d. 4. Lopressor 25 mg p.o. b.i.d. 5. Aspirin 325 mg p.o. q.d. 6. Maxzide 25 mg p.o. q.d. 7. Darvocet N 100 one to two q.4-6h. p.r.n. for pain.  FOLLOW-UP:  Dr. Laneta Simmers in two to three weeks after discharge.  HOSPITAL COURSE:  The patient is a 65 year old white male status post coronary artery bypass surgery times four on Jul 04, 1999.  He had an unremarkable postoperative course, when initially seen on July 29, 1999 postoperatively. Once again, he was seen at [facility] office on September 30, 1999 at which time he was reporting a clicking sensation in his chest particularly in the lower sternal area.  At this point, he was continued to be observed.  He returned on November 11, 1999 reporting that the clicking in his sternum was getting worse and by October, the entire sternum appeared to move whenever he lifted something or bent over.  This was causing him significant discomfort and also preventing him from returning to work.  Dr. Laneta Simmers evaluated the patient and felt the best treatment was to take him to the operating room and rewire the sternum.  The chest x-ray showed that all the sternal wires were intact and in the normal position.  Dr. Laneta Simmers discussed the operative procedure with him including alternatives, benefits and risks including bleeding, possible blood transfusion and injury to the mediastinal  structures, and repeat nonunion of the sternum after this procedure.  The patient understood and agreed to proceed.  On November 27, 1999 the patient underwent a rewiring of the sternum. The patient tolerated the procedure well and was taken to the SICU in stable condition. On November 28, 1999, postoperative day one, the patient remained afebrile and good oxygen saturation on room air.  His chest x-ray showed mild left lung atelectasis, otherwise no other abnormalities.  His WBCs however were elevated with a count of 20.7.  These might have related to the process of inflammation, although it continued to be observed. Repeat labs were ordered for November 29, 1999.  He was transferred to the unit 2000 in stable condition, and started mobilization. On November 29, 1999, postoperative day two, his vital signs were stable.  His WBCs already had decreased somewhat to a valve of 16.7 from the previous 20.7.  With the exception of mild incisional pain, he denied any other complaints.  His incision looked clean and dry with the exception of one very small area, with very small amount of drainage with no visual infection.  On November 30, 1999 his vital signs were stable.  He was in sinus rhythm.  He had no complaints.  His incisions were clean and dry and his exam was within normals.  His WBCs,  decreased in number, slowly returning to baseline.  Later that morning his sutures were removed.  Betadine and Steri-Strips were placed.  The patient tolerated that procedure well.  Later he was discharged to home.  Instructions were given and he verbalized understanding. DD:  12/23/99 TD:  12/23/99 Job: [id number]

## 2010-07-04 NOTE — Discharge Summary (Signed)
NAME:  [name], [name]                  ACCOUNT NO.:  [id number]   MEDICAL RECORD NO.:  [id number]          PATIENT TYPE:  INP   LOCATION:  1528                         FACILITY:  [facility]   PHYSICIAN:  [name] [name], M.D.DATE OF BIRTH:  07/30/1945   DATE OF ADMISSION:  10/18/2006  DATE OF DISCHARGE:  10/21/2006                               DISCHARGE SUMMARY   FINAL DIAGNOSES:  1. Recurrent left sided ischiorectal abscess.  2. Coronary artery disease status post coronary bypass grafting.  3. Gout.  4. Degenerative joint disease.  5. Gastroesophageal reflux disease.   OPERATIONS PERFORMED:  Examination under anesthesia, incision and  drainage of complex perirectal abscess and ischiorectal abscess, left  lateral, left posterior, date of the surgery October 18, 2006.   HISTORY:  This is a 65 year old white man who underwent incision and  drainage of a left-sided perirectal abscess in the emergency room by Dr.  Ovidio [name] in March of 2008.  This resolved.  Four days prior to this  admission, he began feeling some tingling and then progressive pain and  swelling in the left perirectal area.  Denied drainage, fever or chills,  but the pain is severe.  He came in the emergency room, and I evaluated  him, and he had to be admitted for management of a recurrent perirectal  abscess.   For details his past history, social history, family history, please see  the detailed admission note.   PHYSICAL EXAM:  GENERAL:  Pleasant middle-aged gentleman in moderate  distress.  VITAL SIGNS:  Pending at the time of that admission dictation.  NECK:  No adenopathy, nontender.  Well-healed transverse scar on the  left lower neck.  LUNGS:  Clear to auscultation.  Well-healed sternotomy scar.  HEART:  Regular rate and rhythm with murmur.  ABDOMEN:  Soft, nontender, well-healed trocar sites from prior  cholecystectomy.  Liver and spleen not enlarged.  RECTAL:  Large area of induration in the left  perirectal area.  I can  see a radially-oriented scar in the left lateral position, which  appeared well healed.  The area of induration was large, at least 15 cm  in size.  No specific area of pointing or fluctuance, but obviously felt  to have a deep abscess.  The right side looked normal.  No fistula was  noted.  No skin necrosis was noted.   HOSPITAL COURSE:  Following evaluation in the emergency room, the  patient was taken to operating room, where he underwent examination  under anesthesia, endoscopy, incision and drainage of his perirectal  abscess.  This was a fairly large abscess in the left ischiorectal area,  left lateral and left posterior.  Two incisions were made to drain this  out.   Post-op, he did well.  He had fever for the first 24 hours as expected,  but he was able to void reasonably well and was able to have bowel  movements with good control.  Because of the recurrent nature and  complexity of the abscess, he was kept in the hospital on IV  antibiotics.  Each  day, he felt better.  The induration went down, the  pain went down.  This culture actually grew a gram-positive cocci in  pairs and clusters.  On September 4th, he was feeling well, denying pain  and begging to go home.  His wounds looked great. Penrose drains were  removed.  There  was no more purulence.  Induration and erythema were markedly  diminished.  He was given a prescription for doxycycline and Flagyl for  10 days and told to resume his usual medications.  He was asked to  follow up with me in the office in 4 to 6 days.  Wound care and sitz  baths were outlined in detail.      [name] [name], M.D.  Electronically Signed     HMI/MEDQ  D:  11/08/2006  T:  11/08/2006  Job:  [id number]   cc:   [name] [name], M.D.  Fax: [phone]   [name] [name], M.D.  Fax: [phone]

## 2010-07-04 NOTE — Cardiovascular Report (Signed)
[place]. [facility]  Patient:    [name], [name] [name]                         MRN: [id number] Proc. Date: 06/05/99 Adm. Date:  [id number] Attending:  Norman Clay                        Cardiac Catheterization  HISTORY:  This is a 65 year old male who has a recent myocardial infarction and has had some chest discomfort since infarct.  COMMENTS ABOUT PROCEDURE:  The patient tolerated the procedure well without complications.  Following the procedure, good hemostasis and pedal pulses were present.  HEMODYNAMIC DATA: 1. Aorta postcontrast 135/82. 2. Left ventricle postcontrast 135/16.  ANGIOGRAPHIC DATA:  Left ventriculogram:  Performed in the 30 degree RAO projection.  The aortic valve was normal.  The mitral valve was normal.  The left ventricle appears normal in size.  There was mild inferior wall hypokinesis with ejection fractions around 60-65%.  The coronary arteries arise and distribute normal. There is significant calcification noted involving the mid-LAD as well as the right coronary artery.  The left main coronary artery appears normal.  The left anterior descending is heavily calcified.  There is an area of aneurysmal dilatation in the proximal portion of the artery prior to the first diagonal branch.  There is a severe 80% stenosis distal to the first septal perforator. This is followed by a long area of segmental 50% narrowing with some calcification just distal to the lesion.  The distal vessel appears large. The circumflex coronary artery has a 40-50% proximal stenosis.  There is some moderate 40% disease in the mid- and distal portions of the artery.  The right coronary artery is a dominant vessel.  It is calcified throughout its length. There is diffuse disease proximally estimated at 40-50% and is totally occluded in the midportion.  The distal vessel fills by collateral in the left coronary system predominantly through septal  perforator branches.  IMPRESSION: 1. Significant three-vessel coronary artery disease, some aneurysmal disease    in the proximal left anterior descending. 2. Preserved left ventricular function with minimal inferior wall hypokinesis.  RECOMMENDATIONS:  Consideration of bypass grafting versus medical therapy. DD:  06/05/99 TD:  06/05/99 Job: [id number]

## 2010-07-04 NOTE — Cardiovascular Report (Signed)
[place]. [facility]  Patient:    [name] [name], [name] [name] Visit Number: [id number] MRN: [id number]          Service Type: CAT Location: [facility] 2854 01 Attending Physician:  Ophelia Shoulder Dictated by:   Madaline Savage, M.D. Proc. Date: 04/29/01 Admit Date:  04/29/2001   CC:         Alleen Borne, M.D.  Helene Kelp, M.D.   Cardiac Catheterization  PROCEDURES PERFORMED: 1. Selective coronary angiography by Judkins technique. 2. Retrograde left heart catheterization. 3. Left ventricular angiography. 4. Selective visualization of the left internal mammary artery graft. 5. Selective visualization of several saphenous vein grafts. 6. Abdominal aortography.  COMPLICATIONS: None.  ENTRY SITE: Right femoral.  DYE USED: Omnipaque.  PATIENT PROFILE: The patient is a 65 year old gentleman who underwent coronary artery bypass grafting by Dr. Evelene Croon in May of 2001. The patient has an atypical burning and stinging pain around his left breast that was recently evaluated with Cardiolite stress testing. The patient had a inferolateral defect that was reversible and therefore this catheterization was undertaken as an outpatient electively.  RESULTS:  PRESSURES: The left ventricular pressure was 155/25, the central aortic pressure was 150/85 with a mean of 110.  There was no significant aortic valve gradient by pullback technique.  ANGIOGRAPHIC RESULTS: The left main coronary artery was normal in appearance.  The left anterior descending coronary artery either contained a radiopaque stent or a area of concentric calcification in the proximal portion of the vessel. The mid LAD beyond a diagonal branch #1 and the septal perforator branch was 100% occluded. There was distal flow into a small but meaningful mid and distal LAD that was supplied by a patent internal mammary artery graft. The diagonal branch off the LAD which arises well before the  LAD occlusion is patent.  The circumflex is a medium sized vessel with the obtuse marginal branch #1 in the distal vessel being 100% occluded proximally. There is a patent saphenous vein graft to the distal circumflex which is a medium sized vessel and to the OM-1, which is a small size vessel. The vein graft itself appears normal.  The right coronary artery appears to be a medium sized vessel which is 100% occluded in the midportion of the vessel. There is an attempt at some antegrade collateral flow, but not very meaningful. There is a patent vein graft bypassed to the posterior descending branch, which is a relatively small vessel but widely patent.  The internal mammary graft to the mid to distal LAD is patent.  The vein graft to obtuse marginal branch #1 and distal circumflex is patent.  The single vein graft to the posterior descending branch of the RCA is patent.  LEFT VENTRICULAR ANGIOGRAPHY: Left ventricular angiography shows infrabasal wall hypokinesis of mild degree. The inferoapical, apical and entire anterior wall moves vigorously and I estimate ejection fraction at 50-55%. No mitral regurgitation is seen. No LV thrombus.  The abdominal aorta is normal in appearance. There is a pair of left renal arteries which are normal. The right renal artery is single and contains a 50% narrowing at the ostium of that vessel.  FINAL DIAGNOSES: 1. Three-vessel native coronary artery disease.    a. A 100% mid left anterior descending occlusion.    b. A 100% obtuse marginal #1 occlusion and distal circumflex occlusion.    c. A 100% mid right coronary artery occlusion. 2. Status post bypass grafting; all grafts widely patent as  described above    including internal mammary artery to mid left anterior descending,    sequential vein graft to obtuse marginal #1 and distal circumflex and    single vein graft to posterior descending artery. 3. Well preserved left ventricular systolic  function. 4. Normal abdominal aorta. 5. A 50% proximal right renal artery stenosis.  ADDENDUM: The patient had fluoroscopy of the chest, which showed that his original sternal wires are present. He has at least four sternal wires that represent a second set of sternal wires put in. It looks like the bottom three sternal wires have become broken at their insertion site into the tightening portion of the wire. Dictated by:   Madaline Savage, M.D. Attending Physician:  Ophelia Shoulder DD:  04/29/01 TD:  04/30/01 Job: [id number]

## 2010-07-04 NOTE — Consult Note (Signed)
NAME:  [name] [name], [name] [name]                  ACCOUNT NO.:  [id number]   MEDICAL RECORD NO.:  [id number]          PATIENT TYPE:  EMS   LOCATION:  ED                           FACILITY:  [facility]   PHYSICIAN:  Sandria Bales. Ezzard Standing, M.D.  DATE OF BIRTH:  [birth year]   DATE OF CONSULTATION:  05/07/2006  DATE OF DISCHARGE:                                 CONSULTATION   HISTORY:  This is a 65 year old white male, patient of Dr. Juline Patch,  who has been plagued with back pain. He has been evaluated by Dr. Trey Sailors, but over the last three weeks has had increasing left buttocks  pain. Over the last two or three days they have gotten more severe in  which he has had some low-grade fevers. His wife, [name] [name], who is an  O.R. in day surgery, called me today concerned about a  perianal/perirectal abscess.   I met him in the emergency room with this diagnosis. He has no  significant GI history. He has no history of peptic ulcer disease, liver  disease, pancreatic disease, colon disease. His prior abdominal surgery  includes an appendectomy that he had about in the 1980s and then Dr.  Precious Reel did a laparoscopic cholecystectomy in the 1990s.   He has no allergies.   His current medications include:  1. Nexium.  2. Allopurinol.  3. Triamterene/hydrochlorothiazide.  4. Toprol XL.  5. Niaspan.  6. Aspirin 325 mg daily.  7. Hydrocodone/acetaminophen for his back.   His past medical history is significant in that he had from a neurologic  standpoint seen Dr. Trey Sailors. He has had a cervical fusion twice, a lot  of back trouble. This has been followed by Dr. Channing Mutters.  CARDIAC: He had a coronary artery bypass graft by Dr. Evelene Croon in  2001. He had a mediastinal infection problems and he is still having  some problems with that.  GASTROINTESTINAL: See history of present illness.  UROLOGIC: No kidney stones or kidney infections.   PHYSICAL EXAMINATION:  VITAL SIGNS: His temperature is 98.1, blood  pressure 143/94, pulse 93, respirations 24. Sat is 96%.  GENERAL: He is a well-nourished older white male, alert and cooperative.  LUNGS:  Clear to auscultation.  HEART:  Regular rate and rhythm.  ABDOMEN: His abdomen is soft. In the left lateral position he has  swelling of his left buttocks. The swelling seems to be more toward the  posterior left buttocks. He is most tender though directly laterally at  his left rectum.   IMPRESSION:  1. My impression is that he has a left perianal/perirectal abscess. I      plan to do incision and drainage in the room under local      anesthesia.  I discussed with him that after drainage, I plan to      put him on Septra and see him back in 7-10 days for followup.  2. Back pain for which he is being evaluated by Dr. Trey Sailors.  3. History of coronary artery disease, status post CABG, followed by  Dr. __________  4. Hypertension.  5. One aspirin daily.  6. History of gout.      Sandria Bales. Ezzard Standing, M.D.  Electronically Signed     DHN/MEDQ  D:  05/07/2006  T:  05/07/2006  Job:  [id number]   cc:   Juline Patch, M.D.  Fax: [phone]

## 2010-07-04 NOTE — Op Note (Signed)
[place]. [facility]  Patient:    [name] [name], [name] [name]                         MRN: [id number] Proc. Date: 07/04/99 Adm. Date:  [id number] Disc. Date: [id number] Attending:  Cleatrice Burke CC:         Alleen Borne, M.D.             CVTS Office             W. Ashley Royalty., M.D.             Cardiac Catheterization Lab at [facility]                           Operative Report  PREOPERATIVE DIAGNOSES: Severe 3 vessel coronary artery disease with unstable angina.  POSTOPERATIVE DIAGNOSES:  Severe 3 vessel coronary artery disease with unstable angina.  OPERATION: 1. Median sternotomy. 2. Extracorporeal circulation 3. Coronary artery bypass graft surgery x 4 using a left internal mammary    artery  graft to left anterior descending coronary artery, with a    sequential saphenous vein graft to the first obtuse marginal branch of the    left circumflex artery and the distal left circumflex artery itself, and a    saphenous vein graft to the posterior descending branch of the right    coronary artery.  SURGEON:  Alleen Borne, M.D.  ASSISTANT:  Loura Pardon, P.A.C.  ANESTHESIA:  General endotracheal.  CLINICAL HISTORY:  This patient is a 65 year old gentleman who has had substernal chest pain off and on for the past year.  He initially thought that these symptoms may be due to a bulging thoracic disc and did not seek medical attention after discussing it with his neurosurgeon.  It was felt that he is suspicious for angina.  A cardiology evaluation was obtained and a stress thallium exam in December of 2000, was felt to be negative for ischemia.  In mid April he awoke one morning with severe chest discomfort and shortness of breath that lasted about 2 hours and was relieved with aspirin.  He continued to have pain off and on during the day and was admitted to [facility] and ruled in for an acute inferior myocardial infarction.  Cardiac  catheterization was recommended at that time but he signed out against medical advise the next day.  Since then he has continued to have vague chest discomfort and fatigue. He underwent cardiac catheterization by Dr. Donnie Aho on June 05, 1999, that showed significant 3 vessel disease.  The LAD had significant proximal and mid vessel calcification.  There was an 80% stenosis distal to the first septal perforator followed by a long segmental 50% narrowing with some calcification. The left circumflex had a 40-50% proximal stenosis.  There was a medium sized first marginal branch that had an ostial stenosis.  There is also about 40% disease in the mid and distal portion of the left circumflex.  The right coronary artery was a dominant vessel that was calcified throughout its length.  It had diffuse disease proximally and then was totally occluded in the mid portion with faint filling of the distal vessel by collaterals from the left.  Left ventricular ejection fraction was about 60-65% with mid inferior hyperkinesis.  Coronary artery bypass graft surgery was recommended after this catheterization but the patient desired to try medical therapy.  On medical therapy he continued to have episodes of substernal chest pain and was referred for surgery.  After reviewing the angiogram and examination of the patient I felt that coronary artery bypass graft surgery would be the best treatment.  I discussed the operative procedure, alternatives to surgery, benefits, and risks, including bleeding, possible blood transfusion, infection, stroke, myocardial infarction, and death with the patient and his wife who is a Engineer, civil (consulting), and they both understood and agreed to proceed.  The patient continued to have daily chest pain up to the time of having surgery.  OPERATIVE PROCEDURE:  The patient was taken to the operating room and placed on the table in the supine position.  After induction of general  endotracheal anesthesia a Foley catheter was placed in the bladder using sterile technique. Then the chest, abdomen and both lower extremities were prepped and draped in the usual sterile manner.  The chest was entered through a median sternotomy incision and the pericardium opened in the midline.  Examination of the heart shows good ventricular contractility.  The ascending aorta had no palpable plaques in it.  Then the left internal mammary artery was harvested from the chest wall as a pedicle graft.  This was a medium caliber vessel with excellent blood flow through it.  At the same time, a segment of greater saphenous vein was harvested from the right lower leg and this vein was of medium size and good quality.  Then the patient was heparinized and afterward adequate clotting time was achieved, the distal ascending aorta was cannulated using a 20 French arterial cannula for arterial inflow.  Venous outflow was achieved using a two-stage venous cannula through the right atrial appendage.  An antegrade cardioplegia and vent cannula was inserted into aortic root.  The patient was placed on cardiopulmonary bypass and distal coronaries identified.  The LAD had diffuse disease but was suitable for grafting distally.  The first marginal branch was a small to medium sized vessel that had no distal disease in it.  The distal left circumflex terminated as a posterolateral branch that was a large graftable vessel with minimal disease in it.   The right coronary artery had a single posterior descending branch that appeared to be occluded proximally but the distal vessel was suitable for grafting.  Then the aorta was crossclamped and 500 cc of cold blood and antegrade cardioplegia was administered in the aortic root with quick arrest of the heart.  Systemic hypothermia at 20 degrees centigrade and topical hypothermia with iced saline was used.  A temperature probe was placed in the septum  and an insulating pad in the pericardium.   The first distal anastomosis was performed to the first marginal branch.  The internal diameter was about 1.6 mm.  The Conduit used was a segment of greater saphenous vein and anastomosis was performed in an sequential side-to-side manner using continuous 7-0 Prolene suture.  Flow was noted through the graft and was excellent.  The second distal anastomosis was performed to the distal left circumflex. The internal diameter was about 2.0 mm.  The Conduit used was the same segment of greater saphenous vein and anastomosis was performed in an sequential end-to-side manner using continuous 7-0 Prolene suture.  Flow was noted through the graft and was excellent.  Then another dose of cardioplegia was given down this vein graft and in the aortic root.  The third distal anastomosis was performed to the posterior descending branch to the right coronary artery.  The internal  diameter was 1.6 mm.  The Conduit used was the second segment of greater saphenous vein and anastomosis performed in a end-to-side manner using continuous 7-0 Prolene suture.  Flow was measured through the graft and was excellent.  Then another dose of Cardioplegia was given down this the vein graft and the aortic root.  Then the fourth distal anastomosis was performed to the distal portion of the left anterior descending coronary artery.  The internal diameter was 1.75 mm. The Conduit used was the left internal mammary artery and this was brought through an opening in the left pericardium anterior to the phrenic nerve.  It was anastomosed to the LAD in an end-to-side manner using continuous 8-0 Prolene suture.  The pedicle was tacked to the epicardium with 6-0 Prolene sutures.  The patient was rewarmed to 37 degrees centigrade.  The clamp was removed from the mammary pedicle.  There was rapid rewarming of the ventricular septum and return of spontaneous ventricular  fibrillation.  The crossclamp was removed with a time of 57 minutes and the patient defibrillated into sinus rhythm.  The partial occlusion clamp was placed on the aortic root and the two proximal vein graft anastomosis were performed in an end-to-side manner using continuous 6-0 Prolene suture.  The clamp was removed, the vein graft deaired and the clamps removed from them.  The proximal and distal anastomoses appeared hemostatic and line of the grafts satisfactory.  Graft markers were placed around the proximal anastomoses.  Two temporary right ventricular and right atrial pacing wires were placed and brought through the skin.  When the patient had rewarmed to 37 degrees centigrade he was weaned from cardiopulmonary bypass on no inotropic agents.  Total bypass time was 92 minutes.  Cardiac function appeared excellent with a cardiac output of 7 liters per minute.  Protamine was given in the venous and aortic cannulas were removed without difficulty.  Hemostasis was achieved.  Three chest tubes were placed with the tubes in the posterior pericardium and one in the left pleural space and one in the anterior mediastinum.  The pericardium was reapproximated over the heart.  The sternum was closed with #6 stainless steel wires.  The fascia was closed with continuous #1 Vicryl suture.  The subcutaneous tissues were closed with continuous 2-0 Vicryl and the skin with 3-0 Vicryl subcuticular closure.  The lower extremity vein harvest site was closed in layers in a similar manner.  The sponge, needle and instrument counts were correct according to the scrub nurse.  A dry sterile dressings was applied over the incisions and around the chest tubes which were hooked to Pleuravac suction.  The patient remained hemodynamically stable and was transferred to the SICU in guarded but stable condition.  DD:  07/04/99 TD:  07/09/99 Job: [id number]

## 2010-07-04 NOTE — Discharge Summary (Signed)
[place]. [facility]  Patient:    [name] [name], [name] [name]                         MRN: [id number] Adm. Date:  [id number] Disc. Date: [id number] Attending:  Cleatrice Burke Dictator:   Eugenia Pancoast, P.A. CC:         Darden Palmer., M.D.             Alleen Borne, M.D.                           Discharge Summary  DATE OF BIRTH:  Nov 07, 1945  FINAL DIAGNOSES: 1. Coronary artery disease. 2. Gout. 3. Degenerative disk disease.  PROCEDURES:  Jul 04, 1999, coronary artery bypass x 4 with the left internal mammary artery to the left anterior descending, sequential saphenous vein graft to the first obtuse marginal and distal left circumflex artery, saphenous vein graft to the posterior descending artery.  SURGEON:  Alleen Borne, M.D.  HISTORY OF PRESENT ILLNESS:  This 65 year old gentleman was referred to Dr. Rexanne Mano from Dr. Viann Fish for evaluation for cardiovascular revascularization.  Patient apparently had a stress thallium exam done by Dr. Lucas Mallow in December, 2000, which was felt to be negative for ischemia. Patient awoke one morning in mid April with severe chest discomfort and dyspnea which lasted at least two hours and relieved with aspirin.  Patient had pain and tightness in his chest most of that day and still had symptoms on Friday morning.  He was therefore seen in urgent care center by Dr. Ardyth Gal.  He had elevated CPK level and sent to [facility] where he was admitted by Dr. Fraser Din with acute inferior MI.  His CPK was 440 with MB fraction of 22.7.  Troponin was elevated to 0.82.  Cardiac catheterization was recommended but he signed out AMA the next day.  Since then, he has had some vague chest discomfort and fatigue.  Patient subsequently was seen by Dr. Donnie Aho and underwent catheterization on June 05, 1999, which showed significant three-vessel coronary artery disease.  The LAD had significant proximal  and mid vessel calcifications.  There was an 80% stenosis distal to the first septal perforator followed by a long segmental 50% narrowing with some calcification.  The distal vessel appeared large.  His left circumflex had a 40 to 50% proximal stenosis.  There was also 40% stenoses in the mid to distal portion of the vessel.  The right coronary artery was a dominant vessel that was calcific throughout its length and had diffuse disease proximally that was estimated to be approximately 40 to 50% and, then, was totally occluded in the mid portion with filling of the distal vessel by collaterals from the left.  Left ventricular ejection fraction was 60 to 65% with a mid inferior hypokinesis.  Coronary artery bypass surgery was recommended.  The risks and benefits were explained.  Patient understood and agreed with surgery.  HOSPITAL COURSE:  On Jul 04, 1999, patient was admitted to [facility].  At that time, he underwent coronary artery bypass x 4 with the LIMA to the LAD, a sequential saphenous vein graft to the first obtuse marginal and distal left circumflex artery, and saphenous vein graft to the posterior descending artery.  Patient tolerated the procedure well.  No intraoperative complications occurred.  Postoperatively, the patient was extubated on the  operative day.  He continued to progress in a satisfactory manner.  He continued to do well.  On Jul 05, 1999, he was transferred to the step-down unit.  There, he continued to do well.  Patient was a little belligerent to the nurses but, overall, physically, he progressed in a satisfactory manner. He was having more pain than usual.  Then, he was started back on Celebrex as he was taking at home.  His final lab work showed his white cell count was 20.0, hemoglobin 11.6, hematocrit 34.4, platelets 165.  Sodium was 134, potassium 4.4, chloride 98, CO2 31, BUN 18, creatinine 1.5, glucose 121, and calcium 8.6.  Patient went  through cardiac rehab phase satisfactorily. Patient continued to do quite well.  His incisions were all healing satisfactorily, and he was having no complaints.  He was afebrile.  Vital signs were all stable.  He was prepared for discharge.  DISCHARGE MEDICATIONS: 1. Lopressor 50 mg one-half tablet q.12h. 2. Celebrex 200 mg b.i.d. 3. Allopurinol 300 mg q.d. 4. Enteric coated aspirin 325 mg q.d. 5. Tylox one to two p.o. q.4-6h. p.r.n. pain.  FOLLOW-UP:  Patient will follow up with Dr. Donnie Aho in two weeks.  He will see Dr. Rexanne Mano in three weeks.  CONDITION ON DISCHARGE:  Patient was subsequently discharged home in satisfactory and stable condition on Jul 08, 1999. DD:  07/07/99 TD:  07/09/99 Job: [id number]

## 2010-07-04 NOTE — Op Note (Signed)
NAME:  [name], [name]                  ACCOUNT NO.:  [id number]   MEDICAL RECORD NO.:  [id number]          PATIENT TYPE:  AMB   LOCATION:  ENDO                         FACILITY:  [facility]   PHYSICIAN:  Georgiana Spinner, M.D.    DATE OF BIRTH:  10/15/1945   DATE OF PROCEDURE:  10/16/2004  DATE OF DISCHARGE:                                 OPERATIVE REPORT   PROCEDURE:  Colonoscopy.   ENDOSCOPIST:  Georgiana Spinner, M.D.   INDICATIONS:  Colon cancer screening.   ANESTHESIA:  Demerol 40, Versed 6 mg.   PROCEDURE:  With the patient mildly sedated in the left lateral decubitus  position, the rectal exam was performed, but he had very tender hemorrhoids  and I could not really advance my finger much up without it being very  uncomfortable.  Subsequently, the Olympus videoscopic colonoscope was  inserted into the rectum and passed under direct vision to the cecum,  identified by ileocecal valve and appendiceal orifice, both which were  photographed.  From this point, the colonoscope was slowly withdrawn, taking  circumferential views of the colonic mucosa, stopping in the rectum, which  appeared normal on direct and showed hemorrhoids on retroflexed view.  The  endoscope was straightened and withdrawn.  The patient's vital signs and  pulse oximetry remained stable.  The patient tolerated procedure well  without apparent complications.   FINDINGS:  Large internal hemorrhoids, otherwise unremarkable colonoscopic  examination to the cecum.   PLAN:  See endoscopy note for further evaluation.           ______________________________  Georgiana Spinner, M.D.     GMO/MEDQ  D:  10/16/2004  T:  10/16/2004  Job:  [id number]

## 2010-07-04 NOTE — Op Note (Signed)
NAME:  [name], [name]                  ACCOUNT NO.:  [id number]   MEDICAL RECORD NO.:  [id number]          PATIENT TYPE:  EMS   LOCATION:  ED                           FACILITY:  [facility]   PHYSICIAN:  Sandria Bales. Ezzard Standing, M.D.  DATE OF BIRTH:  02-28-1945   DATE OF PROCEDURE:  05/07/2006  DATE OF DISCHARGE:                               OPERATIVE REPORT   PREOP DIAGNOSIS:  Left perirectal abscess or perianal abscess.   POSTOP DIAGNOSIS:  Left perianal abscess.   PROCEDURE:  Incision and drainage of left perianal abscess.   ANESTHESIA:  Approximately 9 mL of 2% Xylocaine plain.   COMPLICATIONS:  None.   REASON FOR PROCEDURE:  [name] is a 65 year old white male who has had  increasing rectal pain over the last 3 weeks.  His wife is an OR Engineer, civil (consulting)  at [employer].  She called me on the phone concerned  about this worsening pain and I met him in the emergency room.  He says  he has had fever on and off over the last couple of days, as high as 101  today, though his temperature is down right now.   PHYSICAL EXAM:  He has swelling of his left buttocks.  He is most tender  at about the direct lateral aspect of his rectum, though his swelling is  a little more posteriorly directed on the left.  I have cleaned the area  with Betadine solution, infiltrated with 1% Xylocaine approximately 9  mL.  I then made an incision into a scan and got into an abscessed  cavity which is about 3 cm in diameter and got about 20-30 mL of pus  out.   I was able to get a finger into the cavity, I packed it with gauze.  He  is on aspirin which is making him a little oozy.  He is going to stop  the aspirin for a few days.  I packed the gauze, he will start soaks  tomorrow.  I gave him Septra DS as an antibiotic.  He will see me back  in 7-10 days for followup in the office, call earlier if anything worse.      Sandria Bales. Ezzard Standing, M.D.  Electronically Signed     DHN/MEDQ  D:  05/07/2006  T:   05/07/2006  Job:  [id number]   cc:   Juline Patch, M.D.  Fax: [phone]   Payton Doughty, M.D.  Fax: [phone]   Colleen Can. Deborah Chalk, M.D.  Fax: [phone]

## 2010-08-25 ENCOUNTER — Other Ambulatory Visit: Payer: Self-pay | Admitting: Internal Medicine

## 2010-08-25 DIAGNOSIS — R748 Abnormal levels of other serum enzymes: Secondary | ICD-10-CM

## 2010-08-25 DIAGNOSIS — R7989 Other specified abnormal findings of blood chemistry: Secondary | ICD-10-CM

## 2010-08-26 ENCOUNTER — Ambulatory Visit
Admission: RE | Admit: 2010-08-26 | Discharge: 2010-08-26 | Disposition: A | Discharge status: Home / Self Care | Payer: Medicare Other | Source: Ambulatory Visit | Attending: Internal Medicine | Admitting: Internal Medicine

## 2010-08-26 DIAGNOSIS — R748 Abnormal levels of other serum enzymes: Secondary | ICD-10-CM

## 2010-08-26 DIAGNOSIS — R7989 Other specified abnormal findings of blood chemistry: Secondary | ICD-10-CM

## 2010-08-29 ENCOUNTER — Encounter (INDEPENDENT_AMBULATORY_CARE_PROVIDER_SITE_OTHER): Payer: Self-pay | Admitting: General Surgery

## 2010-09-04 ENCOUNTER — Encounter (INDEPENDENT_AMBULATORY_CARE_PROVIDER_SITE_OTHER): Payer: Self-pay | Admitting: General Surgery

## 2010-09-04 ENCOUNTER — Ambulatory Visit (INDEPENDENT_AMBULATORY_CARE_PROVIDER_SITE_OTHER): Payer: Medicare Other | Admitting: General Surgery

## 2010-09-04 VITALS — BP 128/82 | HR 78 | Temp 97.6°F | Resp 18 | Ht 72.0 in | Wt 240.0 lb

## 2010-09-04 DIAGNOSIS — K603 Anal fistula: Secondary | ICD-10-CM

## 2010-09-04 NOTE — Progress Notes (Signed)
Subjective:     Patient ID: [name] [name], male   DOB: [birth year], 65 y.o.   MRN: [id number]  HPI  Patient returns because he has recurrent drainage from his chronic fistula in ano in the left lateral position.  Patient had coronary artery bypass grafting in 2001 and rewiring of his sternum in 2011. He has had colonoscopy in 2006 by Dr. Virginia Rochester, and repeat colonoscopy in 2009 by [facility] gastroenterology, and according to the patient he has no evidence of cancer.  The patient underwent incision and drainage of a left perirectal abscess but Ovidio Kin in March of 2008. In December 2008  I performed partial division of a fistula in ano in the left lateral position and placement of a seton. I felt that he had an extra sphincteric fistula in ano. We removed the seton later. He has seen Dr. Mirian Mo at [facility] and surgical intervention was suggested. He declined that surgery because of the risk of incontinence. He has had a chronic fistula since then.  He had knee surgery in June 2011 and took Cipro for 22 days and the fistula completely dried up and became non-painful for about 10 months. One week ago he got constipated. The tissue became hard and then drained on its own. He saw Dr. pains nurse practitioner who prescribed a ProctoCream. He is here today for my opinion. He's not having any fever or chills. He doesn't perceive a mass or abscess. He does have a draining area on the left lateral position where he had that before.Review of Systems  Constitutional: Negative.   HENT: Negative.   Eyes: Negative.   Respiratory: Positive for apnea.   Cardiovascular: Negative.   Gastrointestinal: Positive for constipation, anal bleeding and rectal pain. Negative for nausea, abdominal pain, diarrhea, blood in stool and abdominal distention.  Genitourinary: Negative.   Musculoskeletal: Negative.   Neurological: Negative.   Hematological: Negative.   Psychiatric/Behavioral: Negative.       Past Medical  History  Diagnosis Date  . Arthritis   . Hypertension   . CAD (coronary artery disease)   . GERD (gastroesophageal reflux disease)   . Gout   . Anal fistula   . Myocardial infarction     Current outpatient prescriptions:allopurinol (ZYLOPRIM) 300 MG tablet, Take 300 mg by mouth daily.  , Disp: , Rfl: ;  aspirin 325 MG tablet, Take 325 mg by mouth daily.  , Disp: , Rfl: ;  calcium-vitamin D (OSCAL WITH D) 500-200 MG-UNIT per tablet, Take 1 tablet by mouth daily.  , Disp: , Rfl: ;  clonazePAM (KLONOPIN) 1 MG tablet, Take 1 mg by mouth 2 (two) times daily as needed.  , Disp: , Rfl:  esomeprazole (NEXIUM) 40 MG capsule, Take 40 mg by mouth daily before breakfast.  , Disp: , Rfl: ;  folic acid (FOLVITE) 400 MCG tablet, Take 400 mcg by mouth daily.  , Disp: , Rfl: ;  HYDROcodone-acetaminophen (NORCO) 10-325 MG per tablet, Take 1 tablet by mouth every 6 (six) hours as needed.  , Disp: , Rfl: ;  metoprolol (TOPROL-XL) 100 MG 24 hr tablet, Take 100 mg by mouth daily.  , Disp: , Rfl:  potassium chloride (KLOR-CON) 20 MEQ packet, Take 20 mEq by mouth 2 (two) times daily.  , Disp: , Rfl: ;  pyridoxine (B-6) 100 MG tablet, Take 100 mg by mouth daily.  , Disp: , Rfl: ;  Specialty Vitamins Products (MAGNESIUM, AMINO ACID CHELATE,) 133 MG tablet, Take 1 tablet by  mouth 2 (two) times daily.  , Disp: , Rfl: ;  triamterene-hydrochlorothiazide (DYAZIDE) 37.5-25 MG per capsule, Take 1 capsule by mouth every morning.  , Disp: , Rfl:  vitamin B-12 (CYANOCOBALAMIN) 250 MCG tablet, Take 250 mcg by mouth daily.  , Disp: , Rfl:   Allergies  Allergen Reactions  . Ciprofloxacin Hcl      Family History  Problem Relation Age of Onset  . Colon cancer Mother   . Heart disease Father   . Heart disease Sister   . Heart disease Brother   . Diabetes Sister   . Heart disease Brother   . Heart disease Brother     History  Substance Use Topics  . Smoking status: Former Games developer  . Smokeless tobacco: Never Used  . Alcohol  Use: 21.6 oz/week    36 Cans of beer per week      Objective:   Physical Exam  Constitutional: He is oriented to person, place, and time. He appears well-developed and well-nourished. No distress.  HENT:  Left Ear: External ear normal.  Nose: Nose normal.  Mouth/Throat: No oropharyngeal exudate.  Eyes: EOM are normal. Left eye exhibits discharge. No scleral icterus.  Neck: Normal range of motion. Neck supple. No thyromegaly present.  Cardiovascular: Normal rate and regular rhythm.   Pulmonary/Chest:    Abdominal: He exhibits no distension and no mass. There is no tenderness. There is no rebound and no guarding.  Genitourinary:     Musculoskeletal: He exhibits no edema and no tenderness.  Lymphadenopathy:    He has no cervical adenopathy.  Neurological: He is alert and oriented to person, place, and time. He exhibits normal muscle tone.  Skin: Skin is warm and dry. No rash noted. No erythema. No pallor.  Psychiatric: He has a normal mood and affect. His behavior is normal. Judgment and thought content normal.       Assessment:     Chronic, recurrent fistula in anal left lateral position.  No evidence of abscess or perianal mass.  I suspect that this is a chronic fistula and most likely extra sphincteric.  Operative intervention and division of this is high risk for temporary or permanent incontinence.    Plan:     For now, I would avoid any surgical intervention.  We will place him on oral Levaquin and oral Flagyl for 3 weeks to see if the fistula will close. He is aware that this may or may not closed. He is also aware that if he closes it may open up again in the future.  He will return to see me if there is any progression of his symptoms. Consideration will can be given to fistula plug or referral to a colorectal surgeon in the future. It may be that this will simply need to be managed as a chronic disease process.

## 2010-09-04 NOTE — Progress Notes (Signed)
Addended by: Joanette Gula on: 09/04/2010 10:29 AM   Modules accepted: Orders

## 2010-09-04 NOTE — Patient Instructions (Signed)
You have a recurrent fistula in ano in the left lateral position. I did not find an abscess or any evidence of infection today. I do not feel any evidence of cancer of the anal canal. You should get colonoscopy every 5 years. We will give you a prescription for Levaquin and Flagyl and you will take these antibiotics for 3 weeks. No surgery is planned unless there are further problems. Return to see me if symptoms worsen or do not resolve.

## 2010-09-09 ENCOUNTER — Encounter (INDEPENDENT_AMBULATORY_CARE_PROVIDER_SITE_OTHER): Payer: Medicare Other | Admitting: General Surgery

## 2010-10-21 MED ORDER — LEVOFLOXACIN 500 MG PO TABS: 500.0000 mg | ORAL_TABLET | Freq: Every day | ORAL | Status: AC

## 2010-10-21 MED ORDER — METRONIDAZOLE 500 MG PO TABS: 500.0000 mg | ORAL_TABLET | Freq: Three times a day (TID) | ORAL | Status: AC

## 2010-10-21 NOTE — Progress Notes (Signed)
Addended by: Fidencio Duddy M on: 10/21/2010 04:37 PM   Modules accepted: Orders  

## 2010-11-12 LAB — POCT I-STAT 4, (NA,K, GLUC, HGB,HCT)
HCT: 44
Hemoglobin: 15

## 2010-11-21 LAB — URINALYSIS, ROUTINE W REFLEX MICROSCOPIC
Bilirubin Urine: NEGATIVE
Leukocytes, UA: NEGATIVE
Nitrite: NEGATIVE
Specific Gravity, Urine: 1.016
Urobilinogen, UA: 0.2
pH: 7

## 2010-11-21 LAB — URINE MICROSCOPIC-ADD ON

## 2010-11-28 LAB — COMPREHENSIVE METABOLIC PANEL
ALT: 97 — ABNORMAL HIGH
AST: 80 — ABNORMAL HIGH
Albumin: 3.1 — ABNORMAL LOW
Alkaline Phosphatase: 186 — ABNORMAL HIGH
Alkaline Phosphatase: 198 — ABNORMAL HIGH
BUN: 9
CO2: 28
Calcium: 8.8
Chloride: 99
GFR calc Af Amer: >60
GFR calc non Af Amer: >60
Glucose, Bld: 129 — ABNORMAL HIGH
Potassium: 3.3 — ABNORMAL LOW
Potassium: 4
Sodium: 134 — ABNORMAL LOW
Sodium: 136
Total Protein: 6.6

## 2010-11-28 LAB — ANAEROBIC CULTURE

## 2010-11-28 LAB — VANCOMYCIN, TROUGH: Vancomycin Tr: 11.8

## 2010-11-28 LAB — CBC
HCT: 37.8 — ABNORMAL LOW
Hemoglobin: 11.3 — ABNORMAL LOW
Platelets: 306
RBC: 4.34
RDW: 18.5 — ABNORMAL HIGH
WBC: 11.4 — ABNORMAL HIGH

## 2010-11-28 LAB — CULTURE, ROUTINE-ABSCESS: Culture: NORMAL

## 2010-11-28 LAB — GRAM STAIN

## 2011-02-06 ENCOUNTER — Encounter: Payer: Self-pay | Admitting: Cardiology

## 2011-02-06 ENCOUNTER — Ambulatory Visit (INDEPENDENT_AMBULATORY_CARE_PROVIDER_SITE_OTHER): Payer: Medicare Other | Admitting: Cardiology

## 2011-02-06 DIAGNOSIS — G4700 Insomnia, unspecified: Secondary | ICD-10-CM | POA: Insufficient documentation

## 2011-02-06 DIAGNOSIS — E785 Hyperlipidemia, unspecified: Secondary | ICD-10-CM | POA: Insufficient documentation

## 2011-02-06 DIAGNOSIS — I10 Essential (primary) hypertension: Secondary | ICD-10-CM | POA: Insufficient documentation

## 2011-02-06 DIAGNOSIS — I2510 Atherosclerotic heart disease of native coronary artery without angina pectoris: Secondary | ICD-10-CM

## 2011-02-06 NOTE — Patient Instructions (Signed)
Your physician has requested that you have a lexiscan myoview. For further information please visit [email or web address]. Please follow instruction sheet, as given.  The current medical regimen is effective;  continue present plan and medications.  Follow up in 6 months with Dr Hochrein.  You will receive a letter in the mail 2 months before you are due.  Please call us when you receive this letter to schedule your follow up appointment.  

## 2011-02-06 NOTE — Assessment & Plan Note (Addendum)
The patient has increasing fatigue. This is likely related to his insomnia. However, his bypass grafts are now over 66 years old. He should have a screening stress test. However, because of his knee he would not be a walker in the treadmill. Therefore, he will have a [employer].  I reviewed all of the available office records.

## 2011-02-06 NOTE — Progress Notes (Signed)
HPI The patient presents for evaluation of known coronary disease. The patient presents as a new patient for me having previously been treated by Dr.Tennant.  Since he was last seen he's had no acute chest pain. He has had none of the neck or arm pain that was his angina when he had bypass in 2001. He has had increasing fatigue. However, he reports insomnia. He also has some anxiety. He's been limited by right knee pain. He does activity such as gathering wood in vacuuming. He denies any new shortness of breath, PND or orthopnea. He's not had any palpitations, presyncope or syncope. He's had no weight gain or edema.  Allergies  Allergen Reactions  . Ciprofloxacin Hcl     Current Outpatient Prescriptions  Medication Sig Dispense Refill  . allopurinol (ZYLOPRIM) 300 MG tablet Take 300 mg by mouth daily.        Marland Kitchen aspirin 325 MG tablet Take 325 mg by mouth daily.        . calcium-vitamin D (OSCAL WITH D) 500-200 MG-UNIT per tablet Take 1 tablet by mouth daily.        . clonazePAM (KLONOPIN) 1 MG tablet Take 1 mg by mouth 2 (two) times daily as needed.        Marland Kitchen esomeprazole (NEXIUM) 40 MG capsule Take 40 mg by mouth daily before breakfast.        . folic acid (FOLVITE) 400 MCG tablet Take 400 mcg by mouth daily.        Marland Kitchen HYDROcodone-acetaminophen (NORCO) 10-325 MG per tablet Take 1 tablet by mouth every 6 (six) hours as needed.        Marland Kitchen MAGNESIUM OXIDE PO Take by mouth daily.        . metoprolol (TOPROL-XL) 100 MG 24 hr tablet Take 100 mg by mouth daily.        . nitroGLYCERIN (NITROSTAT) 0.4 MG SL tablet Place 0.4 mg under the tongue every 5 (five) minutes as needed.        . potassium gluconate 595 MG TABS Take 595 mg by mouth daily.        Marland Kitchen triamterene-hydrochlorothiazide (DYAZIDE) 37.5-25 MG per capsule Take 1 capsule by mouth every morning.        . vitamin B-12 (CYANOCOBALAMIN) 250 MCG tablet Take 250 mcg by mouth daily.          Past Medical History  Diagnosis Date  . Arthritis   .  Hypertension   . CAD (coronary artery disease)   . GERD (gastroesophageal reflux disease)   . Gout   . Anal fistula   . Myocardial infarction     Past Surgical History  Procedure Date  . Appendectomy   . Joint replacement   . Cholecystectomy   . Coronary artery bypass graft     2001 LIMA LAD, SVG OM/Circ dista, SVG PDA.  Cath 2009 with patent grafts  . Treatment fistula anal     ROS:  As stated in the HPI and negative for all other systems.  PHYSICAL EXAM BP 147/79  Pulse 69  Resp 16  Ht 6' (1.829 m)  Wt 243 lb (110.224 kg)  BMI 32.96 kg/m2 GENERAL:  Well appearing HEENT:  Pupils equal round and reactive, fundi not visualized, oral mucosa unremarkable NECK:  No jugular venous distention, waveform within normal limits, carotid upstroke brisk and symmetric, no bruits, no thyromegaly LYMPHATICS:  No cervical, inguinal adenopathy LUNGS:  Clear to auscultation bilaterally BACK:  No CVA tenderness CHEST:  Well healed sternotomy scar. HEART:  PMI not displaced or sustained,S1 and S2 within normal limits, no S3, no S4, no clicks, no rubs, no murmurs ABD:  Flat, positive bowel sounds normal in frequency in pitch, no bruits, no rebound, no guarding, no midline pulsatile mass, no hepatomegaly, no splenomegaly EXT:  2 plus pulses throughout, no edema, no cyanosis no clubbing SKIN:  No rashes no nodules NEURO:  Cranial nerves II through XII grossly intact, motor grossly intact throughout PSYCH:  Cognitively intact, oriented to person place and time  EKG:  Sinus rhythm, rate 72, axis within normal limits, intervals within normal limits, no acute ST-T wave changes.  02/06/2011   ASSESSMENT AND PLAN

## 2011-02-06 NOTE — Assessment & Plan Note (Signed)
I will check a lipid profile when he returns.

## 2011-02-06 NOTE — Assessment & Plan Note (Signed)
I have asked him to discuss this with Dr. Ricki Miller who has been working on this issue with the patient.  He is still not sleeping.

## 2011-02-06 NOTE — Assessment & Plan Note (Signed)
The blood pressure is at target. No change in medications is indicated. We will continue with therapeutic lifestyle changes (TLC).  

## 2011-02-11 ENCOUNTER — Ambulatory Visit (HOSPITAL_COMMUNITY): Payer: Medicare Other | Attending: Cardiovascular Disease | Admitting: Radiology

## 2011-02-11 VITALS — BP 145/81 | Ht 72.0 in | Wt 232.0 lb

## 2011-02-11 DIAGNOSIS — Z951 Presence of aortocoronary bypass graft: Secondary | ICD-10-CM | POA: Insufficient documentation

## 2011-02-11 DIAGNOSIS — R5383 Other fatigue: Secondary | ICD-10-CM | POA: Insufficient documentation

## 2011-02-11 DIAGNOSIS — R0789 Other chest pain: Secondary | ICD-10-CM | POA: Insufficient documentation

## 2011-02-11 DIAGNOSIS — Z87891 Personal history of nicotine dependence: Secondary | ICD-10-CM | POA: Insufficient documentation

## 2011-02-11 DIAGNOSIS — I2510 Atherosclerotic heart disease of native coronary artery without angina pectoris: Secondary | ICD-10-CM

## 2011-02-11 DIAGNOSIS — E785 Hyperlipidemia, unspecified: Secondary | ICD-10-CM | POA: Insufficient documentation

## 2011-02-11 DIAGNOSIS — R5381 Other malaise: Secondary | ICD-10-CM | POA: Insufficient documentation

## 2011-02-11 DIAGNOSIS — I252 Old myocardial infarction: Secondary | ICD-10-CM | POA: Insufficient documentation

## 2011-02-11 DIAGNOSIS — R079 Chest pain, unspecified: Secondary | ICD-10-CM

## 2011-02-11 DIAGNOSIS — I10 Essential (primary) hypertension: Secondary | ICD-10-CM | POA: Insufficient documentation

## 2011-02-11 MED ORDER — TECHNETIUM TC 99M TETROFOSMIN IV KIT
30.0000 | PACK | Freq: Once | INTRAVENOUS | Status: AC | PRN
  Administered 2011-02-11: 30 via INTRAVENOUS

## 2011-02-11 MED ORDER — REGADENOSON 0.4 MG/5ML IV SOLN
0.4000 mg | Freq: Once | INTRAVENOUS | Status: AC
  Administered 2011-02-11: 0.4 mg via INTRAVENOUS

## 2011-02-11 MED ORDER — TECHNETIUM TC 99M TETROFOSMIN IV KIT
10.0000 | PACK | Freq: Once | INTRAVENOUS | Status: AC | PRN
  Administered 2011-02-11: 10 via INTRAVENOUS

## 2011-02-11 NOTE — Progress Notes (Signed)
[facility] SITE 3 NUCLEAR MED [address] [place] [address] [phone]  Cardiology Nuclear Med Study  [name] [name] is a 65 y.o. male [id number] 19-Jan-1946   Nuclear Med Background Indication for Stress Test:  Evaluation for Ischemia and Graft Patency History: '01 CABG, '09 Heart Catheterization: patent grafts EF: 55-60%, '01 Myocardial Infarction: Inferior and '05 Myocardial Perfusion Study: No Report Cardiac Risk Factors: History of Smoking, Hypertension and Lipids  Symptoms:  Chest Pain and Fatigue   Nuclear Pre-Procedure Caffeine/Decaff Intake:  None NPO After: 7:00am   Lungs:  clear IV 0.9% NS with Angio Cath:  20g  IV Site: R Hand  IV Started by:  Bonnita Levan, RN  Chest Size (in):  50 Cup Size: n/a  Height: 6' (1.829 m)  Weight:  232 lb (105.235 kg)  BMI:  Body mass index is 31.47 kg/(m^2). Tech Comments:  Patient held Toprol this AM     Nuclear Med Study 1 or 2 day study: 1 day  Stress Test Type:  Eugenie Birks  Reading MD: Kristeen Miss, MD  Order Authorizing Provider: J.Hochrein  Resting Radionuclide: Technetium 70m Tetrofosmin  Resting Radionuclide Dose: 11 mCi   Stress Radionuclide:  Technetium 51m Tetrofosmin  Stress Radionuclide Dose: 33 mCi           Stress Protocol Rest HR: 68 Stress HR: 90  Rest BP: 145/81 Stress BP: 157/94  Exercise Time (min): n/a METS: n/a   Predicted Max HR: 154 bpm % Max HR: 58.44 bpm Rate Pressure Product: 46962   Dose of Adenosine (mg):  n/a Dose of Lexiscan: 0.4 mg  Dose of Atropine (mg): n/a Dose of Dobutamine: n/a mcg/kg/min (at max HR)  Stress Test Technologist: Milana Na, EMT-P  Nuclear Technologist:  Domenic Polite, CNMT     Rest Procedure:  Myocardial perfusion imaging was performed at rest 45 minutes following the intravenous administration of Technetium 46m Tetrofosmin. Rest ECG: NSR with non-specific ST-T wave changes  Stress Procedure:  The patient received IV Lexiscan 0.4 mg over  15-seconds.  Technetium 64m Tetrofosmin injected at 30-seconds.  There were no significant changes, + sob, and occ pvcs with Lexiscan.  Quantitative spect images were obtained after a 45 minute delay. Stress ECG: No significant change from baseline ECG  QPS Raw Data Images:  Normal; no motion artifact; normal heart/lung ratio. Stress Images:  Normal homogeneous uptake in all areas of the myocardium. Rest Images:  Normal homogeneous uptake in all areas of the myocardium. Subtraction (SDS):  No evidence of ischemia. Transient Ischemic Dilatation (Normal <1.22):  .89  Lung/Heart Ratio (Normal <0.45):  .30   Quantitative Gated Spect Images QGS EDV:  101 ml QGS ESV:  33 ml QGS cine images:  NL LV Function; NL Wall Motion QGS EF: 68%  Impression Exercise Capacity:  Lexiscan with no exercise. BP Response:  Normal blood pressure response. Clinical Symptoms:  No chest pain. ECG Impression:  No significant ST segment change suggestive of ischemia. Comparison with Prior Nuclear Study: No images to compare  Overall Impression:  Normal stress nuclear study.  No evidence of ischemia.  Normal LV function with an EF of 68%.    Vesta Mixer, Montez Hageman., MD, [facility] 02/11/2011, 6:06 PM

## 2011-11-04 ENCOUNTER — Encounter (INDEPENDENT_AMBULATORY_CARE_PROVIDER_SITE_OTHER): Payer: Self-pay | Admitting: Surgery

## 2011-11-05 ENCOUNTER — Ambulatory Visit (INDEPENDENT_AMBULATORY_CARE_PROVIDER_SITE_OTHER): Payer: Medicare Other | Admitting: General Surgery

## 2011-11-05 ENCOUNTER — Encounter (INDEPENDENT_AMBULATORY_CARE_PROVIDER_SITE_OTHER): Payer: Self-pay | Admitting: General Surgery

## 2011-11-05 VITALS — BP 134/78 | HR 70 | Temp 97.6°F | Resp 16 | Ht 72.0 in | Wt 237.4 lb

## 2011-11-05 DIAGNOSIS — K611 Rectal abscess: Secondary | ICD-10-CM

## 2011-11-05 DIAGNOSIS — K603 Anal fistula: Secondary | ICD-10-CM

## 2011-11-05 DIAGNOSIS — K612 Anorectal abscess: Secondary | ICD-10-CM

## 2011-11-05 NOTE — Progress Notes (Signed)
Patient ID: [name] [name], male   DOB: 02/06/1946, 66 y.o.   MRN: [id number]  Chief Complaint  Patient presents with  . Follow-up    Rectal fistula    HPI DIYAN [name] is a 66 y.o. male.  He returns because of issues related to his chronic fistula in ano.  .  Patient had coronary artery bypass grafting in 2001 and rewiring of his sternum in 2011. He has had colonoscopy in 2006 by Dr. Virginia Rochester, and repeat colonoscopy in 2009 by [facility] gastroenterology, and according to the patient he has no evidence of cancer.  The patient underwent incision and drainage of a left perirectal abscess by [name] [name] in March of 2008. In December 2008 I performed partial division of a fistula in ano in the left lateral position and placement of a seton. I felt that he had an extra sphincteric fistula in ano. We removed the seton later. He has seen Dr. Mirian Mo at [facility] and surgical intervention was suggested. He declined that surgery because of the risk of incontinence. He has had a chronic fistula since then.  He had knee surgery in June 2011 and took Cipro for 22 days and the fistula completely dried up and became non-painful for about 10 months.The tissue became hard and then drained on its own.  I last saw him about a year ago. Since that time he has had chronic low volume drainage from his fistula in ano on the left side. He doesn't have much pain. One week ago he had swelling and pain and then had some bleeding. He also notices a nodule since yesterday.  He states that he is still not interested any definitive rectal surgery. He states he has had too much surgery.    HPI  Past Medical History  Diagnosis Date  . Arthritis   . Hypertension   . CAD (coronary artery disease)   . GERD (gastroesophageal reflux disease)   . Gout   . Anal fistula   . Myocardial infarction     Past Surgical History  Procedure Date  . Appendectomy   . Joint replacement   . Cholecystectomy   . Coronary artery bypass  graft     2001 LIMA LAD, SVG OM/Circ dista, SVG PDA.  Cath 2009 with patent grafts  . Treatment fistula anal     Family History  Problem Relation Age of Onset  . Colon cancer Mother   . Heart disease Father   . Heart disease Sister   . Heart disease Brother   . Diabetes Sister   . Heart disease Brother   . Heart disease Brother     Social History History  Substance Use Topics  . Smoking status: Former Games developer  . Smokeless tobacco: Never Used  . Alcohol Use: 21.6 oz/week    36 Cans of beer per week    Allergies  Allergen Reactions  . Ciprofloxacin Hcl Other (See Comments)    Causes pain in tendons, feels like muscle spasms.    Current Outpatient Prescriptions  Medication Sig Dispense Refill  . allopurinol (ZYLOPRIM) 300 MG tablet Take 300 mg by mouth daily.        Marland Kitchen aspirin 325 MG tablet Take 325 mg by mouth daily.        . calcium-vitamin D (OSCAL WITH D) 500-200 MG-UNIT per tablet Take 1 tablet by mouth daily.        . clonazePAM (KLONOPIN) 1 MG tablet Take 1 mg by mouth 2 (two)  times daily as needed.        Marland Kitchen esomeprazole (NEXIUM) 40 MG capsule Take 40 mg by mouth daily before breakfast.        . folic acid (FOLVITE) 400 MCG tablet Take 400 mcg by mouth daily.        Marland Kitchen HYDROcodone-acetaminophen (NORCO) 10-325 MG per tablet Take 1 tablet by mouth every 6 (six) hours as needed.        Marland Kitchen MAGNESIUM OXIDE PO Take by mouth daily.        . metoprolol (TOPROL-XL) 100 MG 24 hr tablet Take 100 mg by mouth daily.        . nitroGLYCERIN (NITROSTAT) 0.4 MG SL tablet Place 0.4 mg under the tongue every 5 (five) minutes as needed.        . potassium gluconate 595 MG TABS Take 595 mg by mouth daily.        Marland Kitchen triamterene-hydrochlorothiazide (DYAZIDE) 37.5-25 MG per capsule Take 1 capsule by mouth every morning.        . vitamin B-12 (CYANOCOBALAMIN) 250 MCG tablet Take 250 mcg by mouth daily.          Review of Systems Review of Systems  Constitutional: Negative for fever, chills  and unexpected weight change.  HENT: Negative for hearing loss, congestion, sore throat, trouble swallowing and voice change.   Eyes: Negative for visual disturbance.  Respiratory: Negative for cough and wheezing.   Cardiovascular: Positive for leg swelling. Negative for chest pain and palpitations.  Gastrointestinal: Positive for anal bleeding and rectal pain. Negative for nausea, vomiting, abdominal pain, diarrhea, constipation, blood in stool and abdominal distention.  Genitourinary: Negative for hematuria and difficulty urinating.  Musculoskeletal: Positive for myalgias, joint swelling and gait problem. Negative for arthralgias.  Skin: Negative for rash and wound.  Neurological: Negative for seizures, syncope, weakness and headaches.  Hematological: Negative for adenopathy. Does not bruise/bleed easily.  Psychiatric/Behavioral: Negative for confusion.    Blood pressure 134/78, pulse 70, temperature 97.6 F (36.4 C), temperature source Temporal, resp. rate 16, height 6' (1.829 m), weight 237 lb 6 oz (107.673 kg).  Physical Exam Physical Exam  Constitutional: He is oriented to person, place, and time. He appears well-developed and well-nourished. No distress.  Abdominal: He exhibits no distension. There is no tenderness.  Genitourinary:       Chronic scars left anoderm. There was a superficial abscess in the left side which I incised and it drained  purulent material. I was able to pass a rectal probe 8 cm or more up into this which is his chronic fistula. A little bit more centrally located as the left side there was a small opening which I probed with the rectal probe to pass a probe only  about 2 cm but I did get purulent drainage from this. He did not have any significant cellulitis. The wounds were dressed.  Musculoskeletal:       Walks with a cane. Decreased range of motion and strength left lower extremity. Scar left knee.  Neurological: He is alert and oriented to person, place,  and time. Coordination abnormal.  Skin: Skin is warm and dry. No rash noted. He is not diaphoretic. No erythema. No pallor.  Psychiatric: He has a normal mood and affect. His behavior is normal. Judgment and thought content normal.    Data Reviewed Old chart  Assessment    Superficial perirectal abscess, left lateral, drained today  Chronic fistula in ANO left side, suspect this is extra  sphincteric, status post multiple procedures this is draining well now.    Plan    Augmentin 875 mg by mouth twice a day x7 days  Tub or  sitz bath 3 times daily  I told him that if he ever decided he wanted to consider definitive surgical intervention, that we would make an appointment for him to see Dr. Romie Levee, our new fellowship trained colorectal surgeon.  Otherwise return to see me as needed.       Angelia Mould. Derrell Lolling, M.D., [facility] General and Minimally invasive Surgery Breast and Colorectal Surgery Office:   [phone] Pager:   [phone]  11/05/2011, 1:43 PM

## 2011-11-05 NOTE — Patient Instructions (Signed)
I found a superficial perirectal abscess today, which we incised and drained. This should let the chronic fistula drain much better. There was a second, much more shallow drainage area which I opened up and it also drained purulent fluid.  Take the Augmentin antibiotic for 7 days until it is gone.  Date warm tub baths 2 or 3 times a day.  If you ever decide you want another opinion about definitive surgery, call our office and we'll make a referral to Dr. Maisie Fus, our new colorectal surgeon.

## 2011-12-17 ENCOUNTER — Other Ambulatory Visit: Payer: Self-pay | Admitting: Neurosurgery

## 2011-12-17 DIAGNOSIS — M549 Dorsalgia, unspecified: Secondary | ICD-10-CM

## 2011-12-17 DIAGNOSIS — M47816 Spondylosis without myelopathy or radiculopathy, lumbar region: Secondary | ICD-10-CM

## 2011-12-23 ENCOUNTER — Ambulatory Visit
Admission: RE | Admit: 2011-12-23 | Discharge: 2011-12-23 | Disposition: A | Discharge status: Home / Self Care | Payer: Medicare Other | Source: Ambulatory Visit | Attending: Neurosurgery | Admitting: Neurosurgery

## 2011-12-23 DIAGNOSIS — M47816 Spondylosis without myelopathy or radiculopathy, lumbar region: Secondary | ICD-10-CM

## 2011-12-23 DIAGNOSIS — M549 Dorsalgia, unspecified: Secondary | ICD-10-CM

## 2011-12-23 MED ORDER — METHYLPREDNISOLONE ACETATE 40 MG/ML INJ SUSP (RADIOLOG
120.0000 mg | Freq: Once | INTRAMUSCULAR | Status: AC
  Administered 2011-12-23: 120 mg via INTRA_ARTICULAR

## 2011-12-23 MED ORDER — IOHEXOL 180 MG/ML  SOLN
1.0000 mL | Freq: Once | INTRAMUSCULAR | Status: AC | PRN
  Administered 2011-12-23: 1 mL via INTRA_ARTICULAR

## 2012-01-12 ENCOUNTER — Other Ambulatory Visit: Payer: Self-pay | Admitting: Internal Medicine

## 2012-01-12 ENCOUNTER — Other Ambulatory Visit (HOSPITAL_COMMUNITY): Payer: Self-pay | Admitting: Internal Medicine

## 2012-01-12 DIAGNOSIS — M25569 Pain in unspecified knee: Secondary | ICD-10-CM

## 2012-01-12 DIAGNOSIS — M25559 Pain in unspecified hip: Secondary | ICD-10-CM

## 2012-01-12 DIAGNOSIS — J449 Chronic obstructive pulmonary disease, unspecified: Secondary | ICD-10-CM

## 2012-01-13 ENCOUNTER — Telehealth: Payer: Self-pay | Admitting: Radiology

## 2012-01-13 NOTE — Telephone Encounter (Signed)
Pt has been taking more anti-inflammatory meds and is having more pain than ever. Is scheduled for an MRI next week. Unable to reach Dr. Alfredo Batty re: this phone call.  dd

## 2012-01-20 ENCOUNTER — Ambulatory Visit (HOSPITAL_COMMUNITY): Admission: RE | Admit: 2012-01-20 | Payer: Medicare Other | Source: Ambulatory Visit

## 2012-01-21 ENCOUNTER — Ambulatory Visit
Admission: RE | Admit: 2012-01-21 | Discharge: 2012-01-21 | Disposition: A | Discharge status: Home / Self Care | Payer: Medicare Other | Source: Ambulatory Visit | Attending: Internal Medicine | Admitting: Internal Medicine

## 2012-01-21 DIAGNOSIS — M25559 Pain in unspecified hip: Secondary | ICD-10-CM

## 2012-01-21 DIAGNOSIS — M25569 Pain in unspecified knee: Secondary | ICD-10-CM

## 2012-01-27 ENCOUNTER — Encounter (HOSPITAL_COMMUNITY): Payer: Medicare Other

## 2012-02-12 ENCOUNTER — Telehealth: Payer: Self-pay | Admitting: Cardiology

## 2012-02-12 NOTE — Telephone Encounter (Signed)
Pt request ck up w/Hochrein or PA.  Pt feeling sluggish and tired, heart surgery 2001, Winded at times, Chest discomfort.

## 2012-02-12 NOTE — Telephone Encounter (Addendum)
**Note De-Identified  Obfuscation** Pt. Is 6 months overdue for f/u and wants to schedule an appt. with Dr. Antoine Poche. He c/o fatigue, weakness, occasional sob and chest discomfort that he states has been going on for a couple of months. Attempted to schedule appt. with Tereso Newcomer, PA-C on Monday 12/30 but pt. request to see Dr. Antoine Poche after the new year. Appt. scheduled on 03/11/12 (1st available with Dr. Antoine Poche) and pt. is advised to call [phone] if his symptoms worsen, he verbalized understanding.

## 2012-02-15 ENCOUNTER — Ambulatory Visit: Payer: Medicare Other | Admitting: Physician Assistant

## 2012-02-23 ENCOUNTER — Ambulatory Visit (INDEPENDENT_AMBULATORY_CARE_PROVIDER_SITE_OTHER): Payer: Medicare Other | Admitting: Cardiology

## 2012-02-23 ENCOUNTER — Encounter: Payer: Self-pay | Admitting: Cardiology

## 2012-02-23 VITALS — BP 145/95 | HR 69 | Ht 72.0 in | Wt 246.8 lb

## 2012-02-23 DIAGNOSIS — I2510 Atherosclerotic heart disease of native coronary artery without angina pectoris: Secondary | ICD-10-CM

## 2012-02-23 DIAGNOSIS — E785 Hyperlipidemia, unspecified: Secondary | ICD-10-CM

## 2012-02-23 DIAGNOSIS — I10 Essential (primary) hypertension: Secondary | ICD-10-CM

## 2012-02-23 MED ORDER — METOPROLOL SUCCINATE ER 100 MG PO TB24: 100.0000 mg | ORAL_TABLET | Freq: Every day | ORAL | Status: DC

## 2012-02-23 MED ORDER — NITROGLYCERIN 0.4 MG SL SUBL: 0.4000 mg | SUBLINGUAL_TABLET | SUBLINGUAL | Status: DC | PRN

## 2012-02-23 MED ORDER — FUROSEMIDE 20 MG PO TABS: 20.0000 mg | ORAL_TABLET | ORAL | Status: DC | PRN

## 2012-02-23 MED ORDER — TRIAMTERENE-HCTZ 37.5-25 MG PO CAPS: 1.0000 | ORAL_CAPSULE | ORAL | Status: DC

## 2012-02-23 NOTE — Patient Instructions (Addendum)
You may take Lasix 20 mg a day as needed. Continue all other medications as listed.  Your physician has requested that you have a dobutamine echocardiogram. For further information please visit [email or web address] Please follow instruction sheet as given.  Follow up in 1 year with Dr Antoine Poche.  You will receive a letter in the mail 2 months before you are due.  Please call us when you receive this letter to schedule your follow up appointment.

## 2012-02-23 NOTE — Progress Notes (Signed)
HPI The patient presents for evaluation of known coronary disease. I saw him last year he did have some increasing fatigue. I sent him for a stress perfusion study which demonstrated a preserved ejection fraction and no evidence of ischemia. Since that time he has a slow decline with some fatigue he thinks is reminiscent of his previous coronary symptoms. He has occasional wheezing and takes extra doses of his triamterene.  He is limited by knee pain. He does occasionally get pain under the left breast. This may happen with activity such as carrying wood.  He does keep his house with wood.   He denies any PND or orthopnea. He said no new palpitations, presyncope or syncope. He's had occasional lower externally swelling.   Allergies  Allergen Reactions  . Ciprofloxacin Hcl Other (See Comments)    Causes pain in tendons, feels like muscle spasms.    Current Outpatient Prescriptions  Medication Sig Dispense Refill  . allopurinol (ZYLOPRIM) 300 MG tablet Take 300 mg by mouth daily.        Marland Kitchen aspirin 325 MG tablet Take 325 mg by mouth daily.        . calcium-vitamin D (OSCAL WITH D) 500-200 MG-UNIT per tablet Take 1 tablet by mouth daily.        Marland Kitchen CINNAMON PO Take by mouth.      . clonazePAM (KLONOPIN) 1 MG tablet Take 1 mg by mouth 2 (two) times daily as needed.        Marland Kitchen esomeprazole (NEXIUM) 40 MG capsule Take 40 mg by mouth daily before breakfast.        . folic acid (FOLVITE) 400 MCG tablet Take 400 mcg by mouth daily.        Marland Kitchen HYDROcodone-acetaminophen (NORCO) 10-325 MG per tablet Take 1 tablet by mouth every 6 (six) hours as needed.        Marland Kitchen MAGNESIUM OXIDE PO Take by mouth daily.        . metoprolol (TOPROL-XL) 100 MG 24 hr tablet Take 100 mg by mouth daily.        . nitroGLYCERIN (NITROSTAT) 0.4 MG SL tablet Place 0.4 mg under the tongue every 5 (five) minutes as needed.        . potassium gluconate 595 MG TABS Take 595 mg by mouth daily.        . Probiotic Product (PROBIOTIC DAILY PO)  Take by mouth.      . triamterene-hydrochlorothiazide (DYAZIDE) 37.5-25 MG per capsule Take 1 capsule by mouth every morning.        . vitamin B-12 (CYANOCOBALAMIN) 250 MCG tablet Take 250 mcg by mouth daily.          Past Medical History  Diagnosis Date  . Arthritis   . Hypertension   . CAD (coronary artery disease)   . GERD (gastroesophageal reflux disease)   . Gout   . Anal fistula   . Myocardial infarction     Past Surgical History  Procedure Date  . Appendectomy   . Joint replacement     Right knee  . Cholecystectomy   . Coronary artery bypass graft     2001 LIMA LAD, SVG OM/Circ dista, SVG PDA.  Cath 2009 with patent grafts  . Treatment fistula anal     ROS:  As stated in the HPI and negative for all other systems.  PHYSICAL EXAM BP 145/95  Pulse 69  Ht 6' (1.829 m)  Wt 246 lb 12.8 oz (111.948 kg)  BMI 33.47 kg/m2 GENERAL:  Well appearing HEENT:  Pupils equal round and reactive, fundi not visualized, oral mucosa unremarkable NECK:  No jugular venous distention, waveform within normal limits, carotid upstroke brisk and symmetric, no bruits, no thyromegaly LYMPHATICS:  No cervical, inguinal adenopathy LUNGS:  Clear to auscultation bilaterally BACK:  No CVA tenderness CHEST:  Well healed sternotomy scar. HEART:  PMI not displaced or sustained,S1 and S2 within normal limits, no S3, no S4, no clicks, no rubs, no murmurs ABD:  Flat, positive bowel sounds normal in frequency in pitch, no bruits, no rebound, no guarding, no midline pulsatile mass, no hepatomegaly, no splenomegaly EXT:  2 plus pulses throughout, no edema, no cyanosis no clubbing SKIN:  No rashes no nodules NEURO:  Cranial nerves II through XII grossly intact, motor grossly intact throughout PSYCH:  Cognitively intact, oriented to person place and time  EKG:  Sinus rhythm, rate 69, axis within normal limits, intervals within normal limits, no acute ST-T wave changes.  02/23/2012   ASSESSMENT AND  PLAN  CAD (coronary artery disease) -  The patient has increasing fatigue along with some chest pain. This is progressive since his stress test last year. I would like to walking on a treadmill but he wouldn't be a would do it. He needs stress testing given the age of his bypass grafts. I will order a dobutamine echocardiogram.  Hypertension -  The blood pressure is at target. No change in medications is indicated. We will continue with therapeutic lifestyle changes (TLC). Of note he reports occasional fluid retention and would like to have a low dose diuretic. I will give him Lasix 20 mg to have discussed using this sparingly.  Hyperlipidemia -  This is followed by Juline Patch, MD I will defer to his judgment.

## 2012-03-01 ENCOUNTER — Encounter: Payer: Self-pay | Admitting: Cardiology

## 2012-03-01 ENCOUNTER — Other Ambulatory Visit (HOSPITAL_COMMUNITY): Payer: Medicare Other

## 2012-03-08 ENCOUNTER — Ambulatory Visit (HOSPITAL_COMMUNITY): Payer: Medicare Other | Attending: Cardiology | Admitting: Radiology

## 2012-03-08 ENCOUNTER — Encounter: Payer: Self-pay | Admitting: Cardiology

## 2012-03-08 ENCOUNTER — Ambulatory Visit (HOSPITAL_BASED_OUTPATIENT_CLINIC_OR_DEPARTMENT_OTHER): Payer: Medicare Other | Admitting: Radiology

## 2012-03-08 ENCOUNTER — Other Ambulatory Visit (HOSPITAL_COMMUNITY): Payer: Medicare Other

## 2012-03-08 DIAGNOSIS — R5381 Other malaise: Secondary | ICD-10-CM | POA: Insufficient documentation

## 2012-03-08 DIAGNOSIS — R079 Chest pain, unspecified: Secondary | ICD-10-CM

## 2012-03-08 DIAGNOSIS — E785 Hyperlipidemia, unspecified: Secondary | ICD-10-CM | POA: Insufficient documentation

## 2012-03-08 DIAGNOSIS — R072 Precordial pain: Secondary | ICD-10-CM

## 2012-03-08 DIAGNOSIS — E669 Obesity, unspecified: Secondary | ICD-10-CM | POA: Insufficient documentation

## 2012-03-08 DIAGNOSIS — I219 Acute myocardial infarction, unspecified: Secondary | ICD-10-CM | POA: Insufficient documentation

## 2012-03-08 DIAGNOSIS — R0989 Other specified symptoms and signs involving the circulatory and respiratory systems: Secondary | ICD-10-CM

## 2012-03-08 DIAGNOSIS — I2510 Atherosclerotic heart disease of native coronary artery without angina pectoris: Secondary | ICD-10-CM

## 2012-03-08 DIAGNOSIS — I10 Essential (primary) hypertension: Secondary | ICD-10-CM | POA: Insufficient documentation

## 2012-03-08 DIAGNOSIS — Z87891 Personal history of nicotine dependence: Secondary | ICD-10-CM | POA: Insufficient documentation

## 2012-03-08 DIAGNOSIS — R0609 Other forms of dyspnea: Secondary | ICD-10-CM | POA: Insufficient documentation

## 2012-03-08 MED ORDER — SODIUM CHLORIDE 0.9 % IV SOLN
20.0000 ug/kg | Freq: Once | INTRAVENOUS | Status: AC
  Administered 2012-03-08: 20 ug/kg/min via INTRAVENOUS

## 2012-03-08 MED ORDER — NITROGLYCERIN 0.4 MG SL SUBL
0.4000 mg | SUBLINGUAL_TABLET | Freq: Once | SUBLINGUAL | Status: AC
  Administered 2012-03-08: 0.4 mg via SUBLINGUAL

## 2012-03-08 NOTE — Progress Notes (Signed)
Dobutamine stress echocardiogram performed.

## 2012-03-10 ENCOUNTER — Telehealth: Payer: Self-pay | Admitting: Cardiology

## 2012-03-10 NOTE — Telephone Encounter (Signed)
Advised pt that ECHO has not been formally reviewed by Dr. Antoine Poche and that we will call him once Dr. Antoine Poche reviews the results.  Pt agrees.

## 2012-03-10 NOTE — Telephone Encounter (Signed)
Pt would like the results of his echo

## 2012-03-11 ENCOUNTER — Ambulatory Visit: Payer: Medicare Other | Admitting: Cardiology

## 2012-03-17 ENCOUNTER — Telehealth: Payer: Self-pay | Admitting: Cardiology

## 2012-03-17 NOTE — Telephone Encounter (Signed)
New problem:   Patient calling stating nurse called him on yesterday returning call back to nurse

## 2012-03-17 NOTE — Telephone Encounter (Signed)
Patient called echo results given.Copy of echo sent to Dr.Richard Pang.

## 2012-04-11 ENCOUNTER — Encounter: Payer: Self-pay | Admitting: Cardiology

## 2012-05-11 ENCOUNTER — Other Ambulatory Visit: Payer: Self-pay | Admitting: Neurosurgery

## 2012-05-11 DIAGNOSIS — M47816 Spondylosis without myelopathy or radiculopathy, lumbar region: Secondary | ICD-10-CM

## 2012-05-21 ENCOUNTER — Ambulatory Visit
Admission: RE | Admit: 2012-05-21 | Discharge: 2012-05-21 | Disposition: A | Discharge status: Home / Self Care | Payer: Medicare Other | Source: Ambulatory Visit | Attending: Neurosurgery | Admitting: Neurosurgery

## 2012-05-21 DIAGNOSIS — M47816 Spondylosis without myelopathy or radiculopathy, lumbar region: Secondary | ICD-10-CM

## 2012-07-21 ENCOUNTER — Other Ambulatory Visit: Payer: Self-pay | Admitting: Neurosurgery

## 2012-07-21 DIAGNOSIS — M47816 Spondylosis without myelopathy or radiculopathy, lumbar region: Secondary | ICD-10-CM

## 2012-07-27 ENCOUNTER — Ambulatory Visit
Admission: RE | Admit: 2012-07-27 | Discharge: 2012-07-27 | Disposition: A | Discharge status: Home / Self Care | Payer: Medicare Other | Source: Ambulatory Visit | Attending: Neurosurgery | Admitting: Neurosurgery

## 2012-07-27 DIAGNOSIS — M47816 Spondylosis without myelopathy or radiculopathy, lumbar region: Secondary | ICD-10-CM

## 2012-07-27 MED ORDER — METHYLPREDNISOLONE ACETATE 40 MG/ML INJ SUSP (RADIOLOG
120.0000 mg | Freq: Once | INTRAMUSCULAR | Status: AC
  Administered 2012-07-27: 120 mg via EPIDURAL

## 2012-07-27 MED ORDER — IOHEXOL 180 MG/ML  SOLN
1.0000 mL | Freq: Once | INTRAMUSCULAR | Status: AC | PRN
  Administered 2012-07-27: 1 mL via EPIDURAL

## 2012-07-27 NOTE — Discharge Instructions (Signed)

## 2012-08-25 ENCOUNTER — Other Ambulatory Visit: Payer: Self-pay | Admitting: Neurosurgery

## 2012-08-25 DIAGNOSIS — M719 Bursopathy, unspecified: Secondary | ICD-10-CM

## 2012-08-29 ENCOUNTER — Ambulatory Visit
Admission: RE | Admit: 2012-08-29 | Discharge: 2012-08-29 | Disposition: A | Discharge status: Home / Self Care | Payer: Medicare Other | Source: Ambulatory Visit | Attending: Neurosurgery | Admitting: Neurosurgery

## 2012-08-29 DIAGNOSIS — M719 Bursopathy, unspecified: Secondary | ICD-10-CM

## 2012-08-29 MED ORDER — IOHEXOL 180 MG/ML  SOLN
1.0000 mL | Freq: Once | INTRAMUSCULAR | Status: AC | PRN
  Administered 2012-08-29: 1 mL via INTRA_ARTICULAR

## 2012-08-29 MED ORDER — METHYLPREDNISOLONE ACETATE 40 MG/ML INJ SUSP (RADIOLOG
120.0000 mg | Freq: Once | INTRAMUSCULAR | Status: AC
  Administered 2012-08-29: 120 mg via INTRA_ARTICULAR

## 2012-08-30 ENCOUNTER — Other Ambulatory Visit: Payer: Self-pay | Admitting: Neurosurgery

## 2012-08-30 DIAGNOSIS — M47816 Spondylosis without myelopathy or radiculopathy, lumbar region: Secondary | ICD-10-CM

## 2012-09-12 ENCOUNTER — Ambulatory Visit
Admission: RE | Admit: 2012-09-12 | Discharge: 2012-09-12 | Disposition: A | Discharge status: Home / Self Care | Payer: Medicare Other | Source: Ambulatory Visit | Attending: Neurosurgery | Admitting: Neurosurgery

## 2012-09-12 DIAGNOSIS — M47816 Spondylosis without myelopathy or radiculopathy, lumbar region: Secondary | ICD-10-CM

## 2012-09-12 MED ORDER — IOHEXOL 180 MG/ML  SOLN
1.0000 mL | Freq: Once | INTRAMUSCULAR | Status: AC | PRN
  Administered 2012-09-12: 1 mL via EPIDURAL

## 2012-09-12 MED ORDER — METHYLPREDNISOLONE ACETATE 40 MG/ML INJ SUSP (RADIOLOG
120.0000 mg | Freq: Once | INTRAMUSCULAR | Status: AC
  Administered 2012-09-12: 120 mg via EPIDURAL

## 2012-09-30 ENCOUNTER — Ambulatory Visit (INDEPENDENT_AMBULATORY_CARE_PROVIDER_SITE_OTHER): Payer: Medicare Other | Admitting: General Surgery

## 2013-01-16 ENCOUNTER — Encounter: Payer: Self-pay | Admitting: Physician Assistant

## 2013-01-23 ENCOUNTER — Encounter: Payer: Self-pay | Admitting: Physician Assistant

## 2013-01-23 ENCOUNTER — Ambulatory Visit (INDEPENDENT_AMBULATORY_CARE_PROVIDER_SITE_OTHER): Payer: Medicare Other | Admitting: Physician Assistant

## 2013-01-23 VITALS — BP 142/88 | HR 89 | Ht 73.0 in | Wt 235.0 lb

## 2013-01-23 DIAGNOSIS — I2510 Atherosclerotic heart disease of native coronary artery without angina pectoris: Secondary | ICD-10-CM

## 2013-01-23 DIAGNOSIS — E785 Hyperlipidemia, unspecified: Secondary | ICD-10-CM

## 2013-01-23 DIAGNOSIS — I10 Essential (primary) hypertension: Secondary | ICD-10-CM

## 2013-01-23 NOTE — Progress Notes (Addendum)
[address] [place], [place]  [address] Phone: [phone] Fax:  [phone]  Date:  01/23/2013   ID:  [name] [name], DOB 25-Sep-1945, MRN [id number]  PCP:  Juline Patch, MD  Cardiologist:  Dr. Rollene Rotunda     History of Present Illness: [name] [name] is a 67 y.o. male with a hx of CAD, status post CABG in 2001, HTN, HL.  LHC (04/2007): LAD occluded, then 50%, circumflex occluded, 90% into a small distal continuation branch, RCA occluded, SVG-RCA patent, LIMA-LAD patent, SVG-OM1/distal circumflex patent. EF 55-60%. Abdominal US (08/2010): No AAA.  Myoview (01/2011): No ischemia, EF 68%; normal study.  Dobutamine echocardiogram (02/2012): Positive ECG changes but no stress-induced wall motion abnormalities on echocardiogram (normal study).  Last seen by Dr. Antoine Poche 02/2012.  He is here for annual follow up.  He is doing well.  He did notice some discomfort walking a long distance through our parking lot today in the cold weather after eating lunch.  He does not typically have exertional chest discomfort.  Denies dyspnea, orthopnea, PND, edema, syncope    Recent Labs: No results found for requested labs within last 365 days.  Wt Readings from Last 3 Encounters:  01/23/13 235 lb (106.595 kg)  02/23/12 246 lb 12.8 oz (111.948 kg)  11/05/11 237 lb 6 oz (107.673 kg)     Past Medical History  Diagnosis Date  . Arthritis   . Hypertension   . GERD (gastroesophageal reflux disease)   . Gout   . Anal fistula   . Myocardial infarction   . CAD (coronary artery disease)     a. s/p CABG 2001; b. LHC (3/09):  S-RCA, L-LAD, S-OM1/dCFX all patent, EF 55-60%;  c. myoview (12/12):  no ischemia, EF 68%;  d. Dob Echo (1/14):  + ECG changes but normal echo => med Rx cont'd  . Hyperlipidemia     Current Outpatient Prescriptions  Medication Sig Dispense Refill  . allopurinol (ZYLOPRIM) 300 MG tablet Take 300 mg by mouth daily.        Marland Kitchen aspirin 325 MG tablet Take 325 mg by mouth daily.         . calcium-vitamin D (OSCAL WITH D) 500-200 MG-UNIT per tablet Take 1 tablet by mouth daily.        Marland Kitchen CINNAMON PO Take by mouth.      . clonazePAM (KLONOPIN) 1 MG tablet Take 1 mg by mouth 2 (two) times daily as needed.        Marland Kitchen esomeprazole (NEXIUM) 40 MG capsule Take 40 mg by mouth daily before breakfast.        . folic acid (FOLVITE) 400 MCG tablet Take 400 mcg by mouth daily.       . furosemide (LASIX) 20 MG tablet Take 1 tablet (20 mg total) by mouth as needed.  30 tablet  6  . HYDROcodone-acetaminophen (NORCO) 10-325 MG per tablet Take 1 tablet by mouth every 6 (six) hours as needed.        Marland Kitchen MAGNESIUM OXIDE PO Take by mouth daily.        . metoprolol succinate (TOPROL-XL) 100 MG 24 hr tablet Take 1 tablet (100 mg total) by mouth daily.  90 tablet  3  . nitroGLYCERIN (NITROSTAT) 0.4 MG SL tablet Place 1 tablet (0.4 mg total) under the tongue every 5 (five) minutes as needed.  25 tablet  11  . potassium gluconate 595 MG TABS Take 595 mg by mouth daily.       Marland Kitchen  Probiotic Product (PROBIOTIC DAILY PO) Take by mouth.      . triamterene-hydrochlorothiazide (DYAZIDE) 37.5-25 MG per capsule Take 1 each (1 capsule total) by mouth every morning.  90 capsule  3  . vitamin B-12 (CYANOCOBALAMIN) 250 MCG tablet Take 250 mcg by mouth daily.         No current facility-administered medications for this visit.    Allergies:   Ciprofloxacin hcl   Social History:  The patient  reports that he has quit smoking. He has never used smokeless tobacco. He reports that he drinks about 21.6 ounces of alcohol per week. He reports that he does not use illicit drugs.   Family History:  The patient's family history includes Colon cancer in his mother; Diabetes in his sister; Heart disease in his brother, brother, brother, father, and sister.   ROS:  Please see the history of present illness.      All other systems reviewed and negative.   PHYSICAL EXAM: VS:  BP 142/88  Pulse 89  Ht 6\' 1"  (1.854 m)  Wt 235 lb  (106.595 kg)  BMI 31.01 kg/m2 Well nourished, well developed, in no acute distress HEENT: normal Neck: no JVD Vascular:  No carotid bruits Cardiac:  normal S1, S2; RRR; no murmur Lungs:  clear to auscultation bilaterally, no wheezing, rhonchi or rales Abd: soft, nontender, no hepatomegaly Ext: no edema Skin: warm and dry Neuro:  CNs 2-12 intact, no focal abnormalities noted  EKG:  NSR, HR 89, normal axis, nonspecific ST-T wave changes, first-degree AV block (PR 222 ms)     ASSESSMENT AND PLAN:  1. CAD:  Doing well without angina.  Discomfort he experienced today sounds c/w bronchospasm.  No further workup indicated.  Continue ASA, statin. 2. Hypertension:  Controlled.  3. Hyperlipidemia:  Labs followed by PCP.  Will request most recent lab work.   4. Disposition:  F/u with Dr. Rollene Rotunda in 1 year.   Signed, Tereso Newcomer, PA-C  01/23/2013 3:30 PM    Labs (01/16/13):  K 3.8, Creatinine 1.3, ALT 23, LDL 69, Hgb 14.

## 2013-01-23 NOTE — Patient Instructions (Signed)
NO CHANGES WERE MADE TODAY  Your physician wants you to follow-up in: 1 YEAR WITH DR. HOCHREIN. You will receive a reminder letter in the mail two months in advance. If you don't receive a letter, please call our office to schedule the follow-up appointment.

## 2013-02-28 ENCOUNTER — Emergency Department (HOSPITAL_COMMUNITY): Payer: Medicare Other

## 2013-02-28 ENCOUNTER — Encounter (HOSPITAL_COMMUNITY): Payer: Self-pay | Admitting: Emergency Medicine

## 2013-02-28 ENCOUNTER — Inpatient Hospital Stay (HOSPITAL_COMMUNITY)
Admission: EM | Admit: 2013-02-28 | Discharge: 2013-03-03 | DRG: 470 | Disposition: A | Discharge status: Home Health | Payer: Medicare Other | Attending: Internal Medicine | Admitting: Internal Medicine

## 2013-02-28 DIAGNOSIS — R7989 Other specified abnormal findings of blood chemistry: Secondary | ICD-10-CM

## 2013-02-28 DIAGNOSIS — I252 Old myocardial infarction: Secondary | ICD-10-CM

## 2013-02-28 DIAGNOSIS — Y92009 Unspecified place in unspecified non-institutional (private) residence as the place of occurrence of the external cause: Secondary | ICD-10-CM

## 2013-02-28 DIAGNOSIS — S72001A Fracture of unspecified part of neck of right femur, initial encounter for closed fracture: Secondary | ICD-10-CM | POA: Diagnosis present

## 2013-02-28 DIAGNOSIS — E785 Hyperlipidemia, unspecified: Secondary | ICD-10-CM | POA: Diagnosis present

## 2013-02-28 DIAGNOSIS — Z87891 Personal history of nicotine dependence: Secondary | ICD-10-CM

## 2013-02-28 DIAGNOSIS — R7401 Elevation of levels of liver transaminase levels: Secondary | ICD-10-CM | POA: Diagnosis present

## 2013-02-28 DIAGNOSIS — Z7982 Long term (current) use of aspirin: Secondary | ICD-10-CM

## 2013-02-28 DIAGNOSIS — I2510 Atherosclerotic heart disease of native coronary artery without angina pectoris: Secondary | ICD-10-CM | POA: Diagnosis present

## 2013-02-28 DIAGNOSIS — I10 Essential (primary) hypertension: Secondary | ICD-10-CM | POA: Diagnosis present

## 2013-02-28 DIAGNOSIS — Z800 Family history of malignant neoplasm of digestive organs: Secondary | ICD-10-CM

## 2013-02-28 DIAGNOSIS — R945 Abnormal results of liver function studies: Secondary | ICD-10-CM

## 2013-02-28 DIAGNOSIS — Z833 Family history of diabetes mellitus: Secondary | ICD-10-CM

## 2013-02-28 DIAGNOSIS — G8929 Other chronic pain: Secondary | ICD-10-CM | POA: Diagnosis present

## 2013-02-28 DIAGNOSIS — Z8249 Family history of ischemic heart disease and other diseases of the circulatory system: Secondary | ICD-10-CM

## 2013-02-28 DIAGNOSIS — R9431 Abnormal electrocardiogram [ECG] [EKG]: Secondary | ICD-10-CM | POA: Diagnosis present

## 2013-02-28 DIAGNOSIS — R7402 Elevation of levels of lactic acid dehydrogenase (LDH): Secondary | ICD-10-CM | POA: Diagnosis present

## 2013-02-28 DIAGNOSIS — R740 Nonspecific elevation of levels of transaminase and lactic acid dehydrogenase [LDH]: Secondary | ICD-10-CM

## 2013-02-28 DIAGNOSIS — E119 Type 2 diabetes mellitus without complications: Secondary | ICD-10-CM | POA: Diagnosis present

## 2013-02-28 DIAGNOSIS — E876 Hypokalemia: Secondary | ICD-10-CM | POA: Diagnosis not present

## 2013-02-28 DIAGNOSIS — S72009A Fracture of unspecified part of neck of unspecified femur, initial encounter for closed fracture: Secondary | ICD-10-CM

## 2013-02-28 DIAGNOSIS — Z951 Presence of aortocoronary bypass graft: Secondary | ICD-10-CM

## 2013-02-28 DIAGNOSIS — W010XXA Fall on same level from slipping, tripping and stumbling without subsequent striking against object, initial encounter: Secondary | ICD-10-CM | POA: Diagnosis present

## 2013-02-28 DIAGNOSIS — K219 Gastro-esophageal reflux disease without esophagitis: Secondary | ICD-10-CM | POA: Diagnosis present

## 2013-02-28 LAB — COMPREHENSIVE METABOLIC PANEL
ALBUMIN: 3.7 g/dL (ref 3.5–5.2)
ALK PHOS: 215 U/L — AB (ref 39–117)
ALT: 128 U/L — AB (ref 0–53)
AST: 242 U/L — AB (ref 0–37)
BILIRUBIN TOTAL: 0.4 mg/dL (ref 0.3–1.2)
BUN: 10 mg/dL (ref 6–23)
CHLORIDE: 89 meq/L — AB (ref 96–112)
CO2: 28 meq/L (ref 19–32)
Calcium: 8.6 mg/dL (ref 8.4–10.5)
Creatinine, Ser: 1.02 mg/dL (ref 0.50–1.35)
GFR calc Af Amer: 85 mL/min — ABNORMAL LOW (ref >90)
GFR, EST NON AFRICAN AMERICAN: 73 mL/min — AB (ref >90)
Glucose, Bld: 126 mg/dL — ABNORMAL HIGH (ref 70–99)
POTASSIUM: 3.1 meq/L — AB (ref 3.7–5.3)
SODIUM: 132 meq/L — AB (ref 137–147)
Total Protein: 7.3 g/dL (ref 6.0–8.3)

## 2013-02-28 LAB — CBC
HCT: 39.7 % (ref 39.0–52.0)
Hemoglobin: 12.9 g/dL — ABNORMAL LOW (ref 13.0–17.0)
MCH: 26.9 pg (ref 26.0–34.0)
MCHC: 32.5 g/dL (ref 30.0–36.0)
MCV: 82.7 fL (ref 78.0–100.0)
PLATELETS: 259 10*3/uL (ref 150–400)
RBC: 4.8 MIL/uL (ref 4.22–5.81)
RDW: 16.2 % — AB (ref 11.5–15.5)
WBC: 10.1 10*3/uL (ref 4.0–10.5)

## 2013-02-28 LAB — PROTIME-INR
INR: 0.89 (ref 0.00–1.49)
Prothrombin Time: 11.9 seconds (ref 11.6–15.2)

## 2013-02-28 LAB — TYPE AND SCREEN
ABO/RH(D): O POS
ANTIBODY SCREEN: NEGATIVE

## 2013-02-28 LAB — ABO/RH: ABO/RH(D): O POS

## 2013-02-28 MED ORDER — HYDROMORPHONE HCL PF 1 MG/ML IJ SOLN
1.0000 mg | Freq: Once | INTRAMUSCULAR | Status: AC
  Administered 2013-02-28: 1 mg via INTRAVENOUS

## 2013-02-28 MED ORDER — HYDROMORPHONE HCL PF 1 MG/ML IJ SOLN
1.0000 mg | INTRAMUSCULAR | Status: DC | PRN
  Administered 2013-03-01 (×4): 1 mg via INTRAVENOUS
  Filled 2013-02-28 (×4): qty 1

## 2013-02-28 MED ORDER — SODIUM CHLORIDE 0.9 % IV SOLN
INTRAVENOUS | Status: DC
Start: 2013-02-28 — End: 2013-03-01
  Administered 2013-02-28: 23:00:00 via INTRAVENOUS

## 2013-02-28 MED ORDER — SODIUM CHLORIDE 0.9 % IV BOLUS (SEPSIS)
500.0000 mL | Freq: Once | INTRAVENOUS | Status: AC
  Administered 2013-02-28: 500 mL via INTRAVENOUS

## 2013-02-28 MED ORDER — HYDROCODONE-ACETAMINOPHEN 5-325 MG PO TABS
1.0000 | ORAL_TABLET | ORAL | Status: DC | PRN
  Administered 2013-02-28: 1 via ORAL
  Filled 2013-02-28: qty 1

## 2013-02-28 MED ORDER — ONDANSETRON HCL 4 MG/2ML IJ SOLN
4.0000 mg | Freq: Once | INTRAMUSCULAR | Status: AC
  Administered 2013-02-28: 4 mg via INTRAVENOUS
  Filled 2013-02-28: qty 2

## 2013-02-28 MED ORDER — HYDROMORPHONE HCL PF 1 MG/ML IJ SOLN
1.0000 mg | Freq: Once | INTRAMUSCULAR | Status: DC
  Filled 2013-02-28: qty 1

## 2013-02-28 MED ORDER — MORPHINE SULFATE 2 MG/ML IJ SOLN
0.5000 mg | INTRAMUSCULAR | Status: DC | PRN
  Administered 2013-02-28 – 2013-03-01 (×2): 0.5 mg via INTRAVENOUS
  Filled 2013-02-28 (×2): qty 1

## 2013-02-28 MED ORDER — POTASSIUM CHLORIDE CRYS ER 20 MEQ PO TBCR
40.0000 meq | EXTENDED_RELEASE_TABLET | Freq: Once | ORAL | Status: AC
  Administered 2013-02-28: 40 meq via ORAL
  Filled 2013-02-28: qty 2

## 2013-02-28 MED ORDER — HYDROMORPHONE HCL PF 1 MG/ML IJ SOLN
1.0000 mg | Freq: Once | INTRAMUSCULAR | Status: AC
  Administered 2013-02-28: 1 mg via INTRAVENOUS
  Filled 2013-02-28: qty 1

## 2013-02-28 MED ORDER — ONDANSETRON HCL 4 MG/2ML IJ SOLN: 4.0000 mg | Freq: Three times a day (TID) | INTRAMUSCULAR | Status: AC | PRN

## 2013-02-28 MED ORDER — HYDROCODONE-ACETAMINOPHEN 5-325 MG PO TABS: 1.0000 | ORAL_TABLET | Freq: Four times a day (QID) | ORAL | Status: DC | PRN

## 2013-02-28 NOTE — ED Notes (Signed)
Patient transported to X-ray 

## 2013-02-28 NOTE — Consult Note (Signed)
Melrose Nakayama, MD           Chauncey Cruel, PA-C   [address] [place], [place]  [address]   ORTHOPAEDIC CONSULTATION  ROSIE GOLSON            MRN:  [id number] DOB/SEX:  12/26/1945/male    REQUESTING PHYSICIAN:    CHIEF COMPLAINT:  Painful right hip  HISTORY: ANTOINNE SPADACCINI a 68 y.o. male with medical issues including diabetes and chronic pain.  Knees gave way and he fell at home this evening.  Could not get up and taken to ED.  Denies LOC and new pains elsewhere   PAST MEDICAL HISTORY: Patient Active Problem List   Diagnosis Date Noted  . Fracture of femoral neck, right, closed 02/28/2013  . Fistula-in-ano 11/05/2011  . Peri-rectal abscess 11/05/2011  . Hyperlipidemia 02/06/2011  . Insomnia 02/06/2011  . Hypertension   . CAD (coronary artery disease)    Past Medical History  Diagnosis Date  . Arthritis   . Hypertension   . GERD (gastroesophageal reflux disease)   . Gout   . Anal fistula   . Myocardial infarction   . CAD (coronary artery disease)     a. s/p CABG 2001; b. LHC (3/09):  S-RCA, L-LAD, S-OM1/dCFX all patent, EF 55-60%;  c. myoview (12/12):  no ischemia, EF 68%;  d. Dob Echo (1/14):  + ECG changes but normal echo => med Rx cont'd  . Hyperlipidemia    Past Surgical History  Procedure Laterality Date  . Appendectomy    . Joint replacement      Right knee  . Cholecystectomy    . Coronary artery bypass graft      2001 LIMA LAD, SVG OM/Circ dista, SVG PDA.  Cath 2009 with patent grafts  . Treatment fistula anal       MEDICATIONS:  Current facility-administered medications:HYDROcodone-acetaminophen (NORCO/VICODIN) 5-325 MG per tablet 1-2 tablet, 1-2 tablet, Oral, Q4H PRN, Mirna Mires, MD;  HYDROmorphone (DILAUDID) injection 1 mg, 1 mg, Intravenous, Once, Mirna Mires, MD;  HYDROmorphone (DILAUDID) injection 1 mg, 1 mg, Intravenous, Q2H PRN, Mirna Mires, MD;  ondansetron [facility]) injection 4 mg, 4 mg, Intravenous, Q8H PRN, Mirna Mires, MD Current  outpatient prescriptions:allopurinol (ZYLOPRIM) 300 MG tablet, Take 300 mg by mouth daily.  , Disp: , Rfl: ;  aspirin 325 MG tablet, Take 325 mg by mouth daily.  , Disp: , Rfl: ;  B Complex-C (B-COMPLEX WITH VITAMIN C) tablet, Take 1 tablet by mouth daily., Disp: , Rfl: ;  cholecalciferol (VITAMIN D) 1000 UNITS tablet, Take 1,000 Units by mouth daily., Disp: , Rfl: ;  CINNAMON PO, Take by mouth., Disp: , Rfl:  clonazePAM (KLONOPIN) 1 MG tablet, Take 1 mg by mouth 2 (two) times daily as needed for anxiety. , Disp: , Rfl: ;  cyclobenzaprine (FLEXERIL) 10 MG tablet, Take 10 mg by mouth daily., Disp: , Rfl: ;  esomeprazole (NEXIUM) 40 MG capsule, Take 40 mg by mouth daily before breakfast.  , Disp: , Rfl: ;  furosemide (LASIX) 20 MG tablet, Take 1 tablet (20 mg total) by mouth as needed., Disp: 30 tablet, Rfl: 6 gabapentin (NEURONTIN) 300 MG capsule, Take 300 mg by mouth 3 (three) times daily. , Disp: , Rfl: ;  HYDROcodone-acetaminophen (NORCO) 10-325 MG per tablet, Take 1 tablet by mouth every 6 (six) hours as needed for severe pain. , Disp: , Rfl: ;  MAGNESIUM OXIDE PO, Take 1 capsule by mouth every evening. ,  Disp: , Rfl: ;  metFORMIN (GLUMETZA) 500 MG (MOD) 24 hr tablet, Take 500 mg by mouth every evening., Disp: , Rfl:  metoprolol succinate (TOPROL-XL) 100 MG 24 hr tablet, Take 1 tablet (100 mg total) by mouth daily., Disp: 90 tablet, Rfl: 3;  nitroGLYCERIN (NITROSTAT) 0.4 MG SL tablet, Place 1 tablet (0.4 mg total) under the tongue every 5 (five) minutes as needed., Disp: 25 tablet, Rfl: 11;  potassium gluconate 595 MG TABS, Take 595 mg by mouth daily. , Disp: , Rfl: ;  Probiotic Product (PROBIOTIC DAILY PO), Take 1 capsule by mouth daily. , Disp: , Rfl:  triamterene-hydrochlorothiazide (DYAZIDE) 37.5-25 MG per capsule, Take 1 each (1 capsule total) by mouth every morning., Disp: 90 capsule, Rfl: 3;  venlafaxine XR (EFFEXOR-XR) 75 MG 24 hr capsule, Take 75 mg by mouth daily with breakfast., Disp: , Rfl:    ALLERGIES:   Allergies  Allergen Reactions  . Ciprofloxacin Hcl Other (See Comments)    Causes pain in tendons, feels like muscle spasms.    REVIEW OF SYSTEMS: REVIEWED IN DETAIL IN CHART  FAMILY HISTORY:   Family History  Problem Relation Age of Onset  . Colon cancer Mother   . Heart disease Father   . Heart disease Sister   . Heart disease Brother   . Diabetes Sister   . Heart disease Brother   . Heart disease Brother     SOCIAL HISTORY:   History  Substance Use Topics  . Smoking status: Former Research scientist (life sciences)  . Smokeless tobacco: Never Used  . Alcohol Use: 21.6 oz/week    36 Cans of beer per week     Comment: occasionally      EXAMINATION: Vital signs in last 24 hours: Temp:  [97.7 F (36.5 C)] 97.7 F (36.5 C) (01/13 1933) Pulse Rate:  [85] 85 (01/13 1933) Resp:  [18] 18 (01/13 1933) BP: (139)/(87) 139/87 mmHg (01/13 1933) SpO2:  [93 %] 93 % (01/13 1933)  BP 139/87  Pulse 85  Temp(Src) 97.7 F (36.5 C) (Oral)  Resp 18  SpO2 93%  General Appearance:    Alert, cooperative, no distress, appears stated age  Head:    Normocephalic, without obvious abnormality, atraumatic  Eyes:    PERRL, conjunctiva/corneas clear, EOM's intact, fundi    benign, both eyes       Ears:    Normal TM's and external ear canals, both ears  Nose:   Nares normal, septum midline, mucosa normal, no drainage    or sinus tenderness  Throat:   Lips, mucosa, and tongue normal; teeth and gums normal  Neck:   Supple, symmetrical, trachea midline, no adenopathy;       thyroid:  No enlargement/tenderness/nodules; no carotid   bruit or JVD  Back:     Symmetric, no curvature, ROM normal, no CVA tenderness  Lungs:     Clear to auscultation bilaterally, respirations unlabored  Chest wall:    No tenderness or deformity  Heart:    Regular rate and rhythm, S1 and S2 normal, no murmur, rub   or gallop  Abdomen:     Soft, non-tender, bowel sounds active all four quadrants,    no masses, no organomegaly,  obese           Pulses:   2+ and symmetric all extremities  Skin:   Skin color, texture, turgor normal, no rashes or lesions  Lymph nodes:   Cervical, supraclavicular, and axillary nodes normal  Neurologic:   CNII-XII intact. Normal  strength, sensation and reflexes      throughout    Musculoskeletal Exam  :right leg SER.  Healed TKR incision with some bruising lateral aspect tibia,  Both arm move fully and no bruising there, other leg moves fully   DIAGNOSTIC STUDIES: Recent laboratory studies:  Recent Labs  02/28/13 2025  WBC 10.1  HGB 12.9*  HCT 39.7  PLT 259    Recent Labs  02/28/13 2025  NA 132*  K 3.1*  CL 89*  CO2 28  BUN 10  CREATININE 1.02  GLUCOSE 126*  CALCIUM 8.6   Lab Results  Component Value Date   INR 0.89 02/28/2013   INR 1.34 08/10/2009   INR 1.16 08/09/2009     Recent Radiographic Studies :  Dg Chest 1 View  02/28/2013   CLINICAL DATA:  Right hip fracture.  EXAM: CHEST - 1 VIEW  COMPARISON:  06/02/2011  FINDINGS: Mild cardiomegaly. There is been previous median sternotomy. Elevated left diaphragm which is chronic, often from previous cardiac surgery. Chronic fracture of upper sternal wires. No infiltrate or edema. No effusion or pneumothorax. No acute osseous findings.  IMPRESSION: 1. No active disease. 2. Prior CABG. 3. Chronic left diaphragmatic elevation.   Electronically Signed   By: Jorje Guild M.D.   On: 02/28/2013 21:10   Dg Hip Complete Right  02/28/2013   CLINICAL DATA:  Fall with right hip pain  EXAM: RIGHT HIP - COMPLETE 2+ VIEW  COMPARISON:  None.  FINDINGS: Right femoral neck fracture with varus angulation. The fracture is nearest to a transcervical type. The femoral head is located. Pelvic ring is intact. No significant degenerative changes to the acetabulum.  IMPRESSION: Acute right femoral neck fracture.   Electronically Signed   By: Jorje Guild M.D.   On: 02/28/2013 21:09    ASSESSMENT: right femoral neck fracture   PLAN:  plan is for hemi hip tomorrow after lunch assuming cleared by medical team.  May eat tonight.  Discussed with wife and two sons.  Dr Berenice Primas to see in AM and perform surgery later in day.    Blake Vetrano G 02/28/2013, 10:21 PM

## 2013-02-28 NOTE — ED Notes (Signed)
Pt presents via EMS with c/o fall. Pt resides at home with his wife, pt was getting out of the chair and lost his balance, fell onto his right hip. No other pain besides the right hip. Pt was ambulatory with walker after the fall. No shortening or rotation noted by EMS.

## 2013-02-28 NOTE — ED Provider Notes (Signed)
CSN: [id number]     Arrival date & time 02/28/13  1932 History   First MD Initiated Contact with Patient 02/28/13 1942     Chief Complaint  Patient presents with  . Fall  . Hip Pain   (Consider location/radiation/quality/duration/timing/severity/associated sxs/prior Treatment) Patient is a 68 y.o. male presenting with fall and hip pain. The history is provided by the patient and a relative.  Fall Pertinent negatives include no chest pain, no abdominal pain, no headaches and no shortness of breath.  Hip Pain Pertinent negatives include no chest pain, no abdominal pain, no headaches and no shortness of breath.  pt s/p fall just pta today, arrives via ems.  Pt was getting out of chair, tried to move something out of the way, and fell to right side on hard vinyl floor. C/o constant, dull, mod-severe right hip pain since. Unable to stand/walk since fall. Called ems. No radicular pain. No numbness/weakness. No neck or back pain. Denies faintness or dizziness prior to fall. No head injury or headache. No nv. Denies prior hip fx. No knee pain, prior right tka several yrs ago. Denies any other pain or injury.    Past Medical History  Diagnosis Date  . Arthritis   . Hypertension   . GERD (gastroesophageal reflux disease)   . Gout   . Anal fistula   . Myocardial infarction   . CAD (coronary artery disease)     a. s/p CABG 2001; b. LHC (3/09):  S-RCA, L-LAD, S-OM1/dCFX all patent, EF 55-60%;  c. myoview (12/12):  no ischemia, EF 68%;  d. Dob Echo (1/14):  + ECG changes but normal echo => med Rx cont'd  . Hyperlipidemia    Past Surgical History  Procedure Laterality Date  . Appendectomy    . Joint replacement      Right knee  . Cholecystectomy    . Coronary artery bypass graft      2001 LIMA LAD, SVG OM/Circ dista, SVG PDA.  Cath 2009 with patent grafts  . Treatment fistula anal     Family History  Problem Relation Age of Onset  . Colon cancer Mother   . Heart disease Father   . Heart  disease Sister   . Heart disease Brother   . Diabetes Sister   . Heart disease Brother   . Heart disease Brother    History  Substance Use Topics  . Smoking status: Former Research scientist (life sciences)  . Smokeless tobacco: Never Used  . Alcohol Use: 21.6 oz/week    36 Cans of beer per week     Comment: occasionally     Review of Systems  Constitutional: Negative for fever.  HENT: Negative for sore throat.   Eyes: Negative for redness.  Respiratory: Negative for shortness of breath.   Cardiovascular: Negative for chest pain.  Gastrointestinal: Negative for vomiting and abdominal pain.  Genitourinary: Negative for flank pain.  Musculoskeletal: Negative for back pain and neck pain.  Skin: Negative for wound.  Neurological: Negative for weakness, numbness and headaches.  Hematological: Does not bruise/bleed easily.  Psychiatric/Behavioral: Negative for confusion.    Allergies  Ciprofloxacin hcl  Home Medications   Current Outpatient Rx  Name  Route  Sig  Dispense  Refill  . allopurinol (ZYLOPRIM) 300 MG tablet   Oral   Take 300 mg by mouth daily.           Marland Kitchen aspirin 325 MG tablet   Oral   Take 325 mg by mouth daily.           Marland Kitchen  calcium-vitamin D (OSCAL WITH D) 500-200 MG-UNIT per tablet   Oral   Take 1 tablet by mouth daily.           Marland Kitchen CINNAMON PO   Oral   Take by mouth.         . clonazePAM (KLONOPIN) 1 MG tablet   Oral   Take 1 mg by mouth 2 (two) times daily as needed.           Marland Kitchen esomeprazole (NEXIUM) 40 MG capsule   Oral   Take 40 mg by mouth daily before breakfast.           . folic acid (FOLVITE) 400 MCG tablet   Oral   Take 400 mcg by mouth daily.          . furosemide (LASIX) 20 MG tablet   Oral   Take 1 tablet (20 mg total) by mouth as needed.   30 tablet   6   . HYDROcodone-acetaminophen (NORCO) 10-325 MG per tablet   Oral   Take 1 tablet by mouth every 6 (six) hours as needed.           Marland Kitchen MAGNESIUM OXIDE PO   Oral   Take by mouth daily.            . metoprolol succinate (TOPROL-XL) 100 MG 24 hr tablet   Oral   Take 1 tablet (100 mg total) by mouth daily.   90 tablet   3   . nitroGLYCERIN (NITROSTAT) 0.4 MG SL tablet   Sublingual   Place 1 tablet (0.4 mg total) under the tongue every 5 (five) minutes as needed.   25 tablet   11   . potassium gluconate 595 MG TABS   Oral   Take 595 mg by mouth daily.          . Probiotic Product (PROBIOTIC DAILY PO)   Oral   Take by mouth.         . triamterene-hydrochlorothiazide (DYAZIDE) 37.5-25 MG per capsule   Oral   Take 1 each (1 capsule total) by mouth every morning.   90 capsule   3   . vitamin B-12 (CYANOCOBALAMIN) 250 MCG tablet   Oral   Take 250 mcg by mouth daily.            BP 139/87  Pulse 85  Temp(Src) 97.7 F (36.5 C) (Oral)  Resp 18  SpO2 93% Physical Exam  Nursing note and vitals reviewed. Constitutional: He is oriented to person, place, and time. He appears well-developed and well-nourished. No distress.  HENT:  Head: Atraumatic.  Eyes: Conjunctivae are normal. Pupils are equal, round, and reactive to light.  Neck: Normal range of motion. Neck supple. No tracheal deviation present.  Cardiovascular: Normal rate, regular rhythm, normal heart sounds and intact distal pulses.   Pulmonary/Chest: Effort normal and breath sounds normal. No accessory muscle usage. No respiratory distress. He exhibits no tenderness.  Abdominal: Soft. He exhibits no distension. There is no tenderness.  Musculoskeletal: Normal range of motion.  CTLS spine, non tender, aligned, no step off. Limited rom right hip due to pain, otherwise good rom bil extremities without any other focal pain or bony tenderness. Distal pulses palp RLE.   Neurological: He is alert and oriented to person, place, and time.  sens intact right lower ext/foot. Pt able to flex/extend at ankle, foot, great toe, with normal strength.   Skin: Skin is warm and dry. He is not diaphoretic.  Psychiatric: He has a normal mood and affect.    ED Course  Procedures (including critical care time)   Results for orders placed during the hospital encounter of 02/28/13  CBC      Result Value Range   WBC 10.1  4.0 - 10.5 K/uL   RBC 4.80  4.22 - 5.81 MIL/uL   Hemoglobin 12.9 (*) 13.0 - 17.0 g/dL   HCT 39.7  39.0 - 52.0 %   MCV 82.7  78.0 - 100.0 fL   MCH 26.9  26.0 - 34.0 pg   MCHC 32.5  30.0 - 36.0 g/dL   RDW 16.2 (*) 11.5 - 15.5 %   Platelets 259  150 - 400 K/uL  COMPREHENSIVE METABOLIC PANEL      Result Value Range   Sodium 132 (*) 137 - 147 mEq/L   Potassium 3.1 (*) 3.7 - 5.3 mEq/L   Chloride 89 (*) 96 - 112 mEq/L   CO2 28  19 - 32 mEq/L   Glucose, Bld 126 (*) 70 - 99 mg/dL   BUN 10  6 - 23 mg/dL   Creatinine, Ser 1.02  0.50 - 1.35 mg/dL   Calcium 8.6  8.4 - 10.5 mg/dL   Total Protein 7.3  6.0 - 8.3 g/dL   Albumin 3.7  3.5 - 5.2 g/dL   AST 242 (*) 0 - 37 U/L   ALT 128 (*) 0 - 53 U/L   Alkaline Phosphatase 215 (*) 39 - 117 U/L   Total Bilirubin 0.4  0.3 - 1.2 mg/dL   GFR calc non Af Amer 73 (*) >90 mL/min   GFR calc Af Amer 85 (*) >90 mL/min  PROTIME-INR      Result Value Range   Prothrombin Time 11.9  11.6 - 15.2 seconds   INR 0.89  0.00 - 1.49  TYPE AND SCREEN      Result Value Range   ABO/RH(D) O POS     Antibody Screen PENDING     Sample Expiration 03/03/2013     Dg Chest 1 View  02/28/2013   CLINICAL DATA:  Right hip fracture.  EXAM: CHEST - 1 VIEW  COMPARISON:  06/02/2011  FINDINGS: Mild cardiomegaly. There is been previous median sternotomy. Elevated left diaphragm which is chronic, often from previous cardiac surgery. Chronic fracture of upper sternal wires. No infiltrate or edema. No effusion or pneumothorax. No acute osseous findings.  IMPRESSION: 1. No active disease. 2. Prior CABG. 3. Chronic left diaphragmatic elevation.   Electronically Signed   By: Jorje Guild M.D.   On: 02/28/2013 21:10   Dg Hip Complete Right  02/28/2013   CLINICAL DATA:   Fall with right hip pain  EXAM: RIGHT HIP - COMPLETE 2+ VIEW  COMPARISON:  None.  FINDINGS: Right femoral neck fracture with varus angulation. The fracture is nearest to a transcervical type. The femoral head is located. Pelvic ring is intact. No significant degenerative changes to the acetabulum.  IMPRESSION: Acute right femoral neck fracture.   Electronically Signed   By: Jorje Guild M.D.   On: 02/28/2013 21:09     EKG Interpretation    Date/Time:  Tuesday February 28 2013 21:37:33 EST Ventricular Rate:  84 PR Interval:  248 QRS Duration: 110 QT Interval:  395 QTC Calculation: 467 R Axis:   106 Text Interpretation:  Sinus rhythm Prolonged PR interval Minimal ST elevation, inferior leads No significant change since last tracing Confirmed by Ashok Cordia  MD, Corabelle Spackman ([id number]) on 02/28/2013 9:42:02 PM  MDM  Iv ns. Dilaudid 1 mg iv. zofran iv.  Xrays.  Reviewed nursing notes and prior charts for additional history.   Recheck pain improved but persists.   Dilaudid iv.   pts orthopedist is Dr Dorna Leitz and pt/family request that practice - ortho paged.  Med service called to admit.  Recheck pain improved. Distal pulses palp.      Mirna Mires, MD 02/28/13 2149

## 2013-02-28 NOTE — ED Notes (Signed)
Bed: WA12 Expected date:  Expected time:  Means of arrival:  Comments: fall 

## 2013-03-01 ENCOUNTER — Encounter (HOSPITAL_COMMUNITY): Payer: Self-pay | Admitting: Anesthesiology

## 2013-03-01 ENCOUNTER — Encounter (HOSPITAL_COMMUNITY)
Admission: EM | Disposition: A | Discharge status: Home Health | Payer: Self-pay | Source: Home / Self Care | Attending: Internal Medicine

## 2013-03-01 ENCOUNTER — Inpatient Hospital Stay (HOSPITAL_COMMUNITY): Payer: Medicare Other

## 2013-03-01 ENCOUNTER — Encounter (HOSPITAL_COMMUNITY): Payer: Medicare Other | Admitting: Anesthesiology

## 2013-03-01 ENCOUNTER — Inpatient Hospital Stay: Admit: 2013-03-01 | Payer: Self-pay | Admitting: Orthopedic Surgery

## 2013-03-01 ENCOUNTER — Inpatient Hospital Stay (HOSPITAL_COMMUNITY): Payer: Medicare Other | Admitting: Anesthesiology

## 2013-03-01 DIAGNOSIS — E876 Hypokalemia: Secondary | ICD-10-CM

## 2013-03-01 DIAGNOSIS — I10 Essential (primary) hypertension: Secondary | ICD-10-CM

## 2013-03-01 HISTORY — PX: HIP ARTHROPLASTY: SHX981

## 2013-03-01 LAB — COMPREHENSIVE METABOLIC PANEL
ALK PHOS: 210 U/L — AB (ref 39–117)
ALT: 104 U/L — ABNORMAL HIGH (ref 0–53)
AST: 101 U/L — AB (ref 0–37)
Albumin: 3.5 g/dL (ref 3.5–5.2)
BILIRUBIN TOTAL: 0.5 mg/dL (ref 0.3–1.2)
BUN: 8 mg/dL (ref 6–23)
CHLORIDE: 92 meq/L — AB (ref 96–112)
CO2: 29 meq/L (ref 19–32)
Calcium: 8.2 mg/dL — ABNORMAL LOW (ref 8.4–10.5)
Creatinine, Ser: 0.91 mg/dL (ref 0.50–1.35)
GFR calc Af Amer: >90 mL/min (ref >90)
GFR calc non Af Amer: 85 mL/min — ABNORMAL LOW (ref >90)
GLUCOSE: 123 mg/dL — AB (ref 70–99)
Potassium: 3.1 mEq/L — ABNORMAL LOW (ref 3.7–5.3)
SODIUM: 133 meq/L — AB (ref 137–147)
Total Protein: 6.8 g/dL (ref 6.0–8.3)

## 2013-03-01 LAB — HEMOGLOBIN A1C
Hgb A1c MFr Bld: 6.5 % — ABNORMAL HIGH (ref <5.7)
Mean Plasma Glucose: 140 mg/dL — ABNORMAL HIGH (ref <117)

## 2013-03-01 LAB — GLUCOSE, CAPILLARY
GLUCOSE-CAPILLARY: 135 mg/dL — AB (ref 70–99)
GLUCOSE-CAPILLARY: 141 mg/dL — AB (ref 70–99)
GLUCOSE-CAPILLARY: 197 mg/dL — AB (ref 70–99)
Glucose-Capillary: 123 mg/dL — ABNORMAL HIGH (ref 70–99)
Glucose-Capillary: 144 mg/dL — ABNORMAL HIGH (ref 70–99)
Glucose-Capillary: 130 mg/dL — ABNORMAL HIGH (ref 70–99)
Glucose-Capillary: 165 mg/dL — ABNORMAL HIGH (ref 70–99)

## 2013-03-01 LAB — PRO B NATRIURETIC PEPTIDE: Pro B Natriuretic peptide (BNP): 163.7 pg/mL — ABNORMAL HIGH (ref 0–125)

## 2013-03-01 LAB — TROPONIN I: Troponin I: <0.3 ng/mL (ref <0.30)

## 2013-03-01 SURGERY — HEMIARTHROPLASTY, HIP, DIRECT ANTERIOR APPROACH, FOR FRACTURE
Anesthesia: General | Site: Hip | Laterality: Right

## 2013-03-01 MED ORDER — PROMETHAZINE HCL 25 MG/ML IJ SOLN: 12.5000 mg | Freq: Four times a day (QID) | INTRAMUSCULAR | Status: DC | PRN

## 2013-03-01 MED ORDER — HYDROMORPHONE HCL PF 1 MG/ML IJ SOLN
INTRAMUSCULAR | Status: DC | PRN
  Administered 2013-03-01 (×2): .4 mg via INTRAVENOUS

## 2013-03-01 MED ORDER — CEFAZOLIN SODIUM-DEXTROSE 2-3 GM-% IV SOLR
2.0000 g | Freq: Four times a day (QID) | INTRAVENOUS | Status: AC
  Administered 2013-03-01 – 2013-03-02 (×2): 2 g via INTRAVENOUS
  Filled 2013-03-01 (×2): qty 50

## 2013-03-01 MED ORDER — SODIUM CHLORIDE 0.9 % IJ SOLN
INTRAMUSCULAR | Status: DC | PRN
  Administered 2013-03-01: 10 mL

## 2013-03-01 MED ORDER — SODIUM CHLORIDE 0.9 % IV SOLN
INTRAVENOUS | Status: DC
  Administered 2013-03-01 – 2013-03-02 (×2): via INTRAVENOUS

## 2013-03-01 MED ORDER — ALUM & MAG HYDROXIDE-SIMETH 200-200-20 MG/5ML PO SUSP
30.0000 mL | ORAL | Status: DC | PRN
  Administered 2013-03-02: 30 mL via ORAL
  Filled 2013-03-01: qty 30

## 2013-03-01 MED ORDER — INSULIN ASPART 100 UNIT/ML ~~LOC~~ SOLN
0.0000 [IU] | SUBCUTANEOUS | Status: DC
  Administered 2013-03-01 (×2): 1 [IU] via SUBCUTANEOUS

## 2013-03-01 MED ORDER — ONDANSETRON HCL 4 MG/2ML IJ SOLN
INTRAMUSCULAR | Status: AC
Start: 2013-03-01 — End: 2013-03-01
  Filled 2013-03-01: qty 2

## 2013-03-01 MED ORDER — DIPHENHYDRAMINE HCL 50 MG/ML IJ SOLN
INTRAMUSCULAR | Status: AC
  Administered 2013-03-01: 25 mg via INTRAVENOUS
  Filled 2013-03-01: qty 1

## 2013-03-01 MED ORDER — INSULIN ASPART 100 UNIT/ML ~~LOC~~ SOLN
0.0000 [IU] | Freq: Three times a day (TID) | SUBCUTANEOUS | Status: DC
  Administered 2013-03-02 – 2013-03-03 (×4): 2 [IU] via SUBCUTANEOUS

## 2013-03-01 MED ORDER — BUPIVACAINE-EPINEPHRINE PF 0.5-1:200000 % IJ SOLN
INTRAMUSCULAR | Status: DC | PRN
  Administered 2013-03-01: 30 mL

## 2013-03-01 MED ORDER — GABAPENTIN 300 MG PO CAPS
300.0000 mg | ORAL_CAPSULE | Freq: Three times a day (TID) | ORAL | Status: DC
  Administered 2013-03-01 – 2013-03-03 (×6): 300 mg via ORAL
  Filled 2013-03-01 (×9): qty 1

## 2013-03-01 MED ORDER — BUPIVACAINE-EPINEPHRINE PF 0.5-1:200000 % IJ SOLN
INTRAMUSCULAR | Status: AC
  Filled 2013-03-01: qty 30

## 2013-03-01 MED ORDER — 0.9 % SODIUM CHLORIDE (POUR BTL) OPTIME
TOPICAL | Status: DC | PRN
  Administered 2013-03-01: 1000 mL

## 2013-03-01 MED ORDER — DIPHENHYDRAMINE HCL 50 MG/ML IJ SOLN
25.0000 mg | Freq: Four times a day (QID) | INTRAMUSCULAR | Status: DC | PRN
  Administered 2013-03-01: 25 mg via INTRAVENOUS
  Filled 2013-03-01: qty 1

## 2013-03-01 MED ORDER — OXYCODONE HCL 5 MG PO TABS
10.0000 mg | ORAL_TABLET | Freq: Once | ORAL | Status: AC
  Administered 2013-03-01: 10 mg via ORAL
  Filled 2013-03-01: qty 2

## 2013-03-01 MED ORDER — LACTATED RINGERS IV SOLN
INTRAVENOUS | Status: DC | PRN
  Administered 2013-03-01 (×3): via INTRAVENOUS

## 2013-03-01 MED ORDER — CLONAZEPAM 1 MG PO TABS
1.0000 mg | ORAL_TABLET | Freq: Two times a day (BID) | ORAL | Status: DC | PRN
  Administered 2013-03-01 – 2013-03-02 (×3): 1 mg via ORAL
  Filled 2013-03-01 (×3): qty 1

## 2013-03-01 MED ORDER — ZOLPIDEM TARTRATE 5 MG PO TABS: 5.0000 mg | ORAL_TABLET | Freq: Every evening | ORAL | Status: DC | PRN

## 2013-03-01 MED ORDER — FERROUS SULFATE 325 (65 FE) MG PO TABS
325.0000 mg | ORAL_TABLET | Freq: Two times a day (BID) | ORAL | Status: DC
  Administered 2013-03-02 – 2013-03-03 (×2): 325 mg via ORAL
  Filled 2013-03-01 (×5): qty 1

## 2013-03-01 MED ORDER — NITROGLYCERIN 0.4 MG SL SUBL
0.4000 mg | SUBLINGUAL_TABLET | SUBLINGUAL | Status: DC | PRN
  Administered 2013-03-02: 0.4 mg via SUBLINGUAL
  Filled 2013-03-01 (×2): qty 25

## 2013-03-01 MED ORDER — SODIUM CHLORIDE 0.9 % IJ SOLN
INTRAMUSCULAR | Status: AC
  Filled 2013-03-01: qty 10

## 2013-03-01 MED ORDER — SODIUM CHLORIDE 0.9 % IJ SOLN
INTRAMUSCULAR | Status: AC
Start: 2013-03-01 — End: 2013-03-01
  Filled 2013-03-01: qty 10

## 2013-03-01 MED ORDER — HYDROMORPHONE HCL PF 1 MG/ML IJ SOLN
1.0000 mg | INTRAMUSCULAR | Status: DC | PRN
  Administered 2013-03-02 – 2013-03-03 (×3): 1 mg via INTRAVENOUS
  Filled 2013-03-01 (×3): qty 1

## 2013-03-01 MED ORDER — METHOCARBAMOL 500 MG PO TABS
500.0000 mg | ORAL_TABLET | Freq: Four times a day (QID) | ORAL | Status: DC | PRN
  Administered 2013-03-02 – 2013-03-03 (×2): 500 mg via ORAL
  Filled 2013-03-01 (×2): qty 1

## 2013-03-01 MED ORDER — CISATRACURIUM BESYLATE (PF) 10 MG/5ML IV SOLN
INTRAVENOUS | Status: DC | PRN
Start: 2013-03-01 — End: 2013-03-01
  Administered 2013-03-01: 8 mg via INTRAVENOUS

## 2013-03-01 MED ORDER — METOPROLOL SUCCINATE ER 100 MG PO TB24
100.0000 mg | ORAL_TABLET | Freq: Every day | ORAL | Status: DC
  Administered 2013-03-01 – 2013-03-03 (×3): 100 mg via ORAL
  Filled 2013-03-01 (×3): qty 1

## 2013-03-01 MED ORDER — HYDROMORPHONE HCL PF 2 MG/ML IJ SOLN
INTRAMUSCULAR | Status: AC
  Filled 2013-03-01: qty 1

## 2013-03-01 MED ORDER — FENTANYL CITRATE 0.05 MG/ML IJ SOLN
INTRAMUSCULAR | Status: DC | PRN
  Administered 2013-03-01 (×2): 50 ug via INTRAVENOUS

## 2013-03-01 MED ORDER — ALLOPURINOL 300 MG PO TABS
300.0000 mg | ORAL_TABLET | Freq: Every day | ORAL | Status: DC
  Administered 2013-03-02 – 2013-03-03 (×2): 300 mg via ORAL
  Filled 2013-03-01 (×3): qty 1

## 2013-03-01 MED ORDER — TRIAMTERENE-HCTZ 37.5-25 MG PO CAPS
1.0000 | ORAL_CAPSULE | ORAL | Status: DC
  Filled 2013-03-01 (×2): qty 1

## 2013-03-01 MED ORDER — PROPOFOL 10 MG/ML IV BOLUS
INTRAVENOUS | Status: AC
  Filled 2013-03-01: qty 20

## 2013-03-01 MED ORDER — ASPIRIN EC 325 MG PO TBEC
325.0000 mg | DELAYED_RELEASE_TABLET | Freq: Two times a day (BID) | ORAL | Status: DC
  Administered 2013-03-02 – 2013-03-03 (×3): 325 mg via ORAL
  Filled 2013-03-01 (×5): qty 1

## 2013-03-01 MED ORDER — OXYCODONE HCL 5 MG PO TABS
5.0000 mg | ORAL_TABLET | Freq: Four times a day (QID) | ORAL | Status: DC | PRN
  Administered 2013-03-01: 5 mg via ORAL
  Filled 2013-03-01: qty 1

## 2013-03-01 MED ORDER — DEXAMETHASONE SODIUM PHOSPHATE 10 MG/ML IJ SOLN
INTRAMUSCULAR | Status: AC
  Filled 2013-03-01: qty 1

## 2013-03-01 MED ORDER — DIPHENHYDRAMINE HCL 25 MG PO CAPS
25.0000 mg | ORAL_CAPSULE | ORAL | Status: DC | PRN
  Administered 2013-03-01: 25 mg via ORAL
  Filled 2013-03-01: qty 1

## 2013-03-01 MED ORDER — FENTANYL CITRATE 0.05 MG/ML IJ SOLN
INTRAMUSCULAR | Status: AC
  Filled 2013-03-01: qty 2

## 2013-03-01 MED ORDER — VITAMIN D3 25 MCG (1000 UNIT) PO TABS
1000.0000 [IU] | ORAL_TABLET | Freq: Every day | ORAL | Status: DC
  Administered 2013-03-02 – 2013-03-03 (×2): 1000 [IU] via ORAL
  Filled 2013-03-01 (×3): qty 1

## 2013-03-01 MED ORDER — ONDANSETRON HCL 4 MG/2ML IJ SOLN
INTRAMUSCULAR | Status: DC | PRN
  Administered 2013-03-01: 4 mg via INTRAVENOUS

## 2013-03-01 MED ORDER — METHOCARBAMOL 750 MG PO TABS: 750.0000 mg | ORAL_TABLET | Freq: Three times a day (TID) | ORAL | Status: DC | PRN

## 2013-03-01 MED ORDER — EPHEDRINE SULFATE 50 MG/ML IJ SOLN
INTRAMUSCULAR | Status: DC | PRN
  Administered 2013-03-01 (×5): 5 mg via INTRAVENOUS

## 2013-03-01 MED ORDER — CEFAZOLIN SODIUM-DEXTROSE 2-3 GM-% IV SOLR
2.0000 g | Freq: Once | INTRAVENOUS | Status: AC
  Administered 2013-03-01: 2 g via INTRAVENOUS

## 2013-03-01 MED ORDER — POTASSIUM GLUCONATE 595 (99 K) MG PO TABS
595.0000 mg | ORAL_TABLET | Freq: Every day | ORAL | Status: DC
  Administered 2013-03-02 – 2013-03-03 (×2): 595 mg via ORAL
  Filled 2013-03-01 (×3): qty 1

## 2013-03-01 MED ORDER — STERILE WATER FOR IRRIGATION IR SOLN
Status: DC | PRN
  Administered 2013-03-01: 1500 mL

## 2013-03-01 MED ORDER — SODIUM CHLORIDE 0.9 % IJ SOLN
INTRAMUSCULAR | Status: AC
  Filled 2013-03-01: qty 50

## 2013-03-01 MED ORDER — HYDROCODONE-ACETAMINOPHEN 10-325 MG PO TABS: 1.0000 | ORAL_TABLET | Freq: Four times a day (QID) | ORAL | Status: DC | PRN

## 2013-03-01 MED ORDER — PROPOFOL 10 MG/ML IV BOLUS
INTRAVENOUS | Status: DC | PRN
  Administered 2013-03-01: 50 mg via INTRAVENOUS
  Administered 2013-03-01: 150 mg via INTRAVENOUS

## 2013-03-01 MED ORDER — SUCCINYLCHOLINE CHLORIDE 20 MG/ML IJ SOLN
INTRAMUSCULAR | Status: DC | PRN
  Administered 2013-03-01: 100 mg via INTRAVENOUS

## 2013-03-01 MED ORDER — METFORMIN HCL ER 500 MG PO TB24
500.0000 mg | ORAL_TABLET | Freq: Every evening | ORAL | Status: DC
Start: 2013-03-01 — End: 2013-03-01

## 2013-03-01 MED ORDER — POTASSIUM CHLORIDE CRYS ER 20 MEQ PO TBCR
40.0000 meq | EXTENDED_RELEASE_TABLET | Freq: Once | ORAL | Status: AC
Start: 2013-03-01 — End: 2013-03-01
  Administered 2013-03-01: 40 meq via ORAL
  Filled 2013-03-01: qty 2

## 2013-03-01 MED ORDER — B COMPLEX-C PO TABS
1.0000 | ORAL_TABLET | Freq: Every day | ORAL | Status: DC
  Administered 2013-03-02 – 2013-03-03 (×2): 1 via ORAL
  Filled 2013-03-01 (×3): qty 1

## 2013-03-01 MED ORDER — DOCUSATE SODIUM 100 MG PO CAPS
100.0000 mg | ORAL_CAPSULE | Freq: Two times a day (BID) | ORAL | Status: DC
  Administered 2013-03-01 – 2013-03-03 (×4): 100 mg via ORAL

## 2013-03-01 MED ORDER — SUFENTANIL CITRATE 50 MCG/ML IV SOLN
INTRAVENOUS | Status: AC
  Filled 2013-03-01: qty 1

## 2013-03-01 MED ORDER — DEXAMETHASONE SODIUM PHOSPHATE 10 MG/ML IJ SOLN
INTRAMUSCULAR | Status: DC | PRN
  Administered 2013-03-01: 10 mg via INTRAVENOUS

## 2013-03-01 MED ORDER — MAGNESIUM OXIDE 400 (241.3 MG) MG PO TABS
400.0000 mg | ORAL_TABLET | Freq: Every day | ORAL | Status: DC
  Administered 2013-03-02 – 2013-03-03 (×2): 400 mg via ORAL
  Filled 2013-03-01 (×3): qty 1

## 2013-03-01 MED ORDER — LIDOCAINE HCL (CARDIAC) 20 MG/ML IV SOLN
INTRAVENOUS | Status: AC
  Filled 2013-03-01: qty 5

## 2013-03-01 MED ORDER — PANTOPRAZOLE SODIUM 40 MG PO TBEC
40.0000 mg | DELAYED_RELEASE_TABLET | Freq: Every day | ORAL | Status: DC
  Administered 2013-03-01 – 2013-03-03 (×3): 40 mg via ORAL
  Filled 2013-03-01 (×3): qty 1

## 2013-03-01 MED ORDER — ASPIRIN EC 325 MG PO TBEC: 325.0000 mg | DELAYED_RELEASE_TABLET | Freq: Two times a day (BID) | ORAL | Status: DC

## 2013-03-01 MED ORDER — KETOROLAC TROMETHAMINE 30 MG/ML IJ SOLN
15.0000 mg | Freq: Four times a day (QID) | INTRAMUSCULAR | Status: AC
  Administered 2013-03-01 – 2013-03-02 (×3): 15 mg via INTRAVENOUS
  Filled 2013-03-01 (×3): qty 1

## 2013-03-01 MED ORDER — METHOCARBAMOL 100 MG/ML IJ SOLN
500.0000 mg | Freq: Four times a day (QID) | INTRAVENOUS | Status: DC | PRN
  Administered 2013-03-02: 500 mg via INTRAVENOUS
  Filled 2013-03-01: qty 5

## 2013-03-01 MED ORDER — CYCLOBENZAPRINE HCL 10 MG PO TABS
10.0000 mg | ORAL_TABLET | Freq: Every day | ORAL | Status: DC
  Administered 2013-03-01: 10 mg via ORAL
  Filled 2013-03-01: qty 1

## 2013-03-01 MED ORDER — VENLAFAXINE HCL ER 75 MG PO CP24
75.0000 mg | ORAL_CAPSULE | Freq: Every day | ORAL | Status: DC
  Administered 2013-03-02 – 2013-03-03 (×2): 75 mg via ORAL
  Filled 2013-03-01 (×3): qty 1

## 2013-03-01 MED ORDER — SODIUM CHLORIDE 0.9 % IR SOLN
Status: DC | PRN
  Administered 2013-03-01: 1000 mL

## 2013-03-01 MED ORDER — SUFENTANIL CITRATE 50 MCG/ML IV SOLN
INTRAVENOUS | Status: DC | PRN
  Administered 2013-03-01: 10 ug via INTRAVENOUS
  Administered 2013-03-01: 20 ug via INTRAVENOUS
  Administered 2013-03-01 (×2): 10 ug via INTRAVENOUS

## 2013-03-01 MED ORDER — BUPIVACAINE LIPOSOME 1.3 % IJ SUSP
20.0000 mL | Freq: Once | INTRAMUSCULAR | Status: AC
  Administered 2013-03-01: 20 mL
  Filled 2013-03-01: qty 20

## 2013-03-01 MED ORDER — LIDOCAINE HCL (CARDIAC) 20 MG/ML IV SOLN
INTRAVENOUS | Status: DC | PRN
  Administered 2013-03-01: 100 mg via INTRAVENOUS

## 2013-03-01 MED ORDER — DIPHENHYDRAMINE HCL 50 MG/ML IJ SOLN
25.0000 mg | Freq: Once | INTRAMUSCULAR | Status: AC
  Administered 2013-03-01: 25 mg via INTRAVENOUS

## 2013-03-01 MED ORDER — TRIAMTERENE-HCTZ 37.5-25 MG PO TABS
1.0000 | ORAL_TABLET | Freq: Every day | ORAL | Status: DC
  Administered 2013-03-01 – 2013-03-03 (×3): 1 via ORAL
  Filled 2013-03-01 (×3): qty 1

## 2013-03-01 MED ORDER — HYDROCODONE-ACETAMINOPHEN 10-325 MG PO TABS
1.0000 | ORAL_TABLET | Freq: Four times a day (QID) | ORAL | Status: DC | PRN
  Administered 2013-03-01 – 2013-03-02 (×2): 1 via ORAL
  Administered 2013-03-02: 2 via ORAL
  Administered 2013-03-02: 1 via ORAL
  Administered 2013-03-02 – 2013-03-03 (×4): 2 via ORAL
  Filled 2013-03-01: qty 2
  Filled 2013-03-01 (×2): qty 1
  Filled 2013-03-01 (×3): qty 2
  Filled 2013-03-01: qty 1
  Filled 2013-03-01: qty 2

## 2013-03-01 MED ORDER — GLYCOPYRROLATE 0.2 MG/ML IJ SOLN
INTRAMUSCULAR | Status: AC
  Filled 2013-03-01: qty 2

## 2013-03-01 MED ORDER — OXYCODONE-ACETAMINOPHEN 5-325 MG PO TABS: 1.0000 | ORAL_TABLET | Freq: Four times a day (QID) | ORAL | Status: DC | PRN

## 2013-03-01 MED ORDER — EPHEDRINE SULFATE 50 MG/ML IJ SOLN
INTRAMUSCULAR | Status: AC
  Filled 2013-03-01: qty 1

## 2013-03-01 SURGICAL SUPPLY — 52 items
BAG SPEC THK2 15X12 ZIP CLS (MISCELLANEOUS) ×1
BAG ZIPLOCK 12X15 (MISCELLANEOUS) ×2 IMPLANT
BALL HIP ARTICU 28 +5 (Hips) IMPLANT
BIPOLAR PROS AML 55 (Hips) IMPLANT
BIT DRILL 2.4X128 (BIT) ×2 IMPLANT
BLADE SAW SAG 73X25 THK (BLADE) ×1
BLADE SAW SGTL 73X25 THK (BLADE) ×1 IMPLANT
BRUSH FEMORAL CANAL (MISCELLANEOUS) IMPLANT
CAPT HIP FX BIPOLAR/UNIPOLAR ×1 IMPLANT
CEMENT BONE DEPUY (Cement) ×2 IMPLANT
CEMENT RESTRICTOR DEPUY SZ 3 (Cement) ×1 IMPLANT
COVER SURGICAL LIGHT HANDLE (MISCELLANEOUS) ×2 IMPLANT
DRAPE INCISE IOBAN 66X45 STRL (DRAPES) ×2 IMPLANT
DRAPE ORTHO SPLIT 77X108 STRL (DRAPES) ×4
DRAPE POUCH INSTRU U-SHP 10X18 (DRAPES) ×2 IMPLANT
DRAPE SURG ORHT 6 SPLT 77X108 (DRAPES) ×2 IMPLANT
DRSG MEPILEX BORDER 4X12 (GAUZE/BANDAGES/DRESSINGS) ×1 IMPLANT
DRSG MEPILEX BORDER 4X8 (GAUZE/BANDAGES/DRESSINGS) ×2 IMPLANT
DURAPREP 26ML APPLICATOR (WOUND CARE) ×2 IMPLANT
ELECT BLADE TIP CTD 4 INCH (ELECTRODE) ×1 IMPLANT
ELECT REM PT RETURN 9FT ADLT (ELECTROSURGICAL) ×2
ELECTRODE REM PT RTRN 9FT ADLT (ELECTROSURGICAL) ×1 IMPLANT
EVACUATOR 1/8 PVC DRAIN (DRAIN) IMPLANT
FACESHIELD LNG OPTICON STERILE (SAFETY) ×8 IMPLANT
GLOVE BIO SURGEON STRL SZ8 (GLOVE) ×4 IMPLANT
GLOVE ECLIPSE 7.5 STRL STRAW (GLOVE) ×4 IMPLANT
HANDPIECE INTERPULSE COAX TIP (DISPOSABLE) ×2
HEAD BIPOLAR PROS AML 55 (Hips) IMPLANT
HIP BALL ARTICU 28 +5 (Hips) IMPLANT
HOOD PEEL AWAY FACE SHEILD DIS (HOOD) ×2 IMPLANT
IMMOBILIZER KNEE 20 (SOFTGOODS) IMPLANT
MANIFOLD NEPTUNE II (INSTRUMENTS) ×2 IMPLANT
NEEDLE HYPO 22GX1.5 SAFETY (NEEDLE) ×2 IMPLANT
NS IRRIG 1000ML POUR BTL (IV SOLUTION) ×2 IMPLANT
PACK TOTAL JOINT (CUSTOM PROCEDURE TRAY) ×2 IMPLANT
PASSER SUT SWANSON 36MM LOOP (INSTRUMENTS) ×2 IMPLANT
POSITIONER SURGICAL ARM (MISCELLANEOUS) ×2 IMPLANT
PRESSURIZER FEMORAL UNIV (MISCELLANEOUS) ×1 IMPLANT
SET HNDPC FAN SPRY TIP SCT (DISPOSABLE) IMPLANT
STAPLER VISISTAT 35W (STAPLE) ×2 IMPLANT
SUCTION FRAZIER TIP 10 FR DISP (SUCTIONS) ×2 IMPLANT
SUT ETHIBOND NAB CT1 #1 30IN (SUTURE) ×4 IMPLANT
SUT VIC AB 0 CTX 27 (SUTURE) ×5 IMPLANT
SUT VIC AB 1 CTX 36 (SUTURE) ×8
SUT VIC AB 1 CTX36XBRD ANBCTR (SUTURE) ×4 IMPLANT
SUT VIC AB 2-0 CT1 27 (SUTURE) ×6
SUT VIC AB 2-0 CT1 TAPERPNT 27 (SUTURE) ×3 IMPLANT
SYR CONTROL 10ML LL (SYRINGE) ×2 IMPLANT
TOWEL OR 17X26 10 PK STRL BLUE (TOWEL DISPOSABLE) ×2 IMPLANT
TOWER CARTRIDGE SMART MIX (DISPOSABLE) ×1 IMPLANT
TRAY FOLEY CATH 14FRSI W/METER (CATHETERS) IMPLANT
WATER STERILE IRR 1500ML POUR (IV SOLUTION) ×2 IMPLANT

## 2013-03-01 NOTE — Anesthesia Procedure Notes (Signed)
Procedure Name: Intubation Date/Time: 03/01/2013 2:05 PM Performed by: Danley Danker L Patient Re-evaluated:Patient Re-evaluated prior to inductionOxygen Delivery Method: Circle system utilized Preoxygenation: Pre-oxygenation with 100% oxygen Intubation Type: IV induction Ventilation: Mask ventilation without difficulty and Oral airway inserted - appropriate to patient size Laryngoscope Size: Miller and 3 Grade View: Grade I Tube type: Oral Tube size: 8.0 mm Number of attempts: 1 Airway Equipment and Method: Stylet Placement Confirmation: ETT inserted through vocal cords under direct vision,  breath sounds checked- equal and bilateral and positive ETCO2 Secured at: 21 cm Tube secured with: Tape Dental Injury: Teeth and Oropharynx as per pre-operative assessment

## 2013-03-01 NOTE — Progress Notes (Signed)
Clinical Social Work Department BRIEF PSYCHOSOCIAL ASSESSMENT 03/01/2013  Patient:  [name] [name], [name] [name]     Account Number:  [id number]     Admit date:  02/28/2013  Clinical Social Worker:  Lacie Scotts  Date/Time:  03/01/2013 12:41 PM  Referred by:  Physician  Date Referred:  03/01/2013 Referred for  SNF Placement   Other Referral:   Interview type:  Patient Other interview type:    PSYCHOSOCIAL DATA Living Status:  WIFE Admitted from facility:   Level of care:   Primary support name:  [name] [name] Primary support relationship to patient:  SPOUSE Degree of support available:   unclear    CURRENT CONCERNS Current Concerns  Post-Acute Placement   Other Concerns:    SOCIAL WORK ASSESSMENT / PLAN Pt is a 68 yr old gentleman living at home prior to hospitalization. Pt was hospitalized 1/13 dx with a hip fx. Ortho has consulted and surgery is pending. CSW met briefly with pt this am. No family at bedside. Pt had difficulty staying awake during CSW visit. D/C planning not addressed at this time. CSW will meet with pt following surgery to assist with d/c planning needs.   Assessment/plan status:  Psychosocial Support/Ongoing Assessment of Needs Other assessment/ plan:   Information/referral to community resources:   None at this time.    PATIENT'S/FAMILY'S RESPONSE TO PLAN OF CARE: No d/c plan in place at this time. Surgery / PT recommendations are pending. CSW will follow to assist with d/c planning.    Werner Lean LCSW [phone]

## 2013-03-01 NOTE — H&P (Signed)
[facility] History and Physical  [name] [name] AOZ:[id number] DOB: [birth year] DOA: 02/28/2013  Referring physician: Lajean Saver, ER physician PCP: Tommy Medal, MD   Chief Complaint: Fall and hip pain  HPI: [name] [name] is a 68 y.o. male  With past medical history of CAD and is normally quite ambulatory and today tripped and fell on his rub landing on his right side. The patient was unable to stand following. He was brought into the emergency room where x-rays confirmed a right femoral neck fracture. Patient's restless labs were unremarkable except for some moderate transaminitis with a AST of 242 and ALT 128. Other findings were noted for an EKG that noted minimal ST elevation in the inferior leads but with normal troponin. Orthopedic surgery was consulted who plan to take patient to the operating room tomorrow afternoon. Hospitals were called for further evaluation and admission.   Review of Systems:  Patient seen after arrival to floor. In mild distress secondary to hip pain. Also complains of right foot itching, which apparently is a chronic issue. Denies any headaches, vision changes, dysphagia, chest pain, palpitations, shortness of breath, wheeze, cough, abdominal pain, hematuria, dysuria, constipation, diarrhea no focal extremity numbness, weakness or pain other than described above. His review systems otherwise negative.  Past Medical History  Diagnosis Date  . Arthritis   . Hypertension   . GERD (gastroesophageal reflux disease)   . Gout   . Anal fistula   . Myocardial infarction   . CAD (coronary artery disease)     a. s/p CABG 2001; b. LHC (3/09):  S-RCA, L-LAD, S-OM1/dCFX all patent, EF 55-60%;  c. myoview (12/12):  no ischemia, EF 68%;  d. Dob Echo (1/14):  + ECG changes but normal echo => med Rx cont'd  . Hyperlipidemia    Past Surgical History  Procedure Laterality Date  . Appendectomy    . Joint replacement      Right knee  . Cholecystectomy    . Coronary  artery bypass graft      2001 LIMA LAD, SVG OM/Circ dista, SVG PDA.  Cath 2009 with patent grafts  . Treatment fistula anal     Social History:  reports that he has quit smoking. He has never used smokeless tobacco. He reports that he drinks about 21.6 ounces of alcohol per week. He reports that he does not use illicit drugs. normally, patient is able to ambulate without assistance  Allergies  Allergen Reactions  . Ciprofloxacin Hcl Other (See Comments)    Causes pain in tendons, feels like muscle spasms.    Family History  Problem Relation Age of Onset  . Colon cancer Mother   . Heart disease Father   . Heart disease Sister   . Heart disease Brother   . Diabetes Sister   . Heart disease Brother   . Heart disease Brother      Prior to Admission medications   Medication Sig Start Date End Date Taking? Authorizing Provider  allopurinol (ZYLOPRIM) 300 MG tablet Take 300 mg by mouth daily.     Yes Historical Provider, MD  aspirin 325 MG tablet Take 325 mg by mouth daily.     Yes Historical Provider, MD  B Complex-C (B-COMPLEX WITH VITAMIN C) tablet Take 1 tablet by mouth daily.   Yes Historical Provider, MD  cholecalciferol (VITAMIN D) 1000 UNITS tablet Take 1,000 Units by mouth daily.   Yes Historical Provider, MD  CINNAMON PO Take by mouth.   Yes Historical Provider, MD  clonazePAM (KLONOPIN) 1 MG tablet Take 1 mg by mouth 2 (two) times daily as needed for anxiety.    Yes Historical Provider, MD  cyclobenzaprine (FLEXERIL) 10 MG tablet Take 10 mg by mouth daily.   Yes Historical Provider, MD  esomeprazole (NEXIUM) 40 MG capsule Take 40 mg by mouth daily before breakfast.     Yes Historical Provider, MD  furosemide (LASIX) 20 MG tablet Take 1 tablet (20 mg total) by mouth as needed. 02/23/12  Yes Minus Breeding, MD  gabapentin (NEURONTIN) 300 MG capsule Take 300 mg by mouth 3 (three) times daily.  02/21/13  Yes Historical Provider, MD  HYDROcodone-acetaminophen (NORCO) 10-325 MG per  tablet Take 1 tablet by mouth every 6 (six) hours as needed for severe pain.    Yes Historical Provider, MD  MAGNESIUM OXIDE PO Take 1 capsule by mouth every evening.    Yes Historical Provider, MD  metFORMIN (GLUMETZA) 500 MG (MOD) 24 hr tablet Take 500 mg by mouth every evening.   Yes Historical Provider, MD  metoprolol succinate (TOPROL-XL) 100 MG 24 hr tablet Take 1 tablet (100 mg total) by mouth daily. 02/23/12  Yes Minus Breeding, MD  nitroGLYCERIN (NITROSTAT) 0.4 MG SL tablet Place 1 tablet (0.4 mg total) under the tongue every 5 (five) minutes as needed. 02/23/12  Yes Minus Breeding, MD  potassium gluconate 595 MG TABS Take 595 mg by mouth daily.    Yes Historical Provider, MD  Probiotic Product (PROBIOTIC DAILY PO) Take 1 capsule by mouth daily.    Yes Historical Provider, MD  triamterene-hydrochlorothiazide (DYAZIDE) 37.5-25 MG per capsule Take 1 each (1 capsule total) by mouth every morning. 02/23/12  Yes Minus Breeding, MD  venlafaxine XR (EFFEXOR-XR) 75 MG 24 hr capsule Take 75 mg by mouth daily with breakfast.   Yes Historical Provider, MD   Physical Exam: Filed Vitals:   02/28/13 2335  BP:   Pulse:   Temp:   Resp: 16    BP 160/82  Pulse 88  Temp(Src) 97.7 F (36.5 C) (Oral)  Resp 16  SpO2 95%  General:  Alert and oriented x3, mild discomfort secondary to pain HEENT: Normocephalic, atraumatic, mucous members are slightly dry Cardiovascular: Regular rate and rhythm, J8-H6, soft 2/6 systolic ejection murmur Lungs: Clear to auscultation bilaterally Abdomen: Soft, nontender, nondistended, positive bowel sounds Extremities: No clubbing cyanosis, trace edema Neuro: No focal deficits Psych: Patient is appropriate no evidence of psychoses           Labs on Admission:  Basic Metabolic Panel:  Recent Labs Lab 02/28/13 2025  NA 132*  K 3.1*  CL 89*  CO2 28  GLUCOSE 126*  BUN 10  CREATININE 1.02  CALCIUM 8.6   Liver Function Tests:  Recent Labs Lab 02/28/13 2025   AST 242*  ALT 128*  ALKPHOS 215*  BILITOT 0.4  PROT 7.3  ALBUMIN 3.7   No results found for this basename: LIPASE, AMYLASE,  in the last 168 hours No results found for this basename: AMMONIA,  in the last 168 hours CBC:  Recent Labs Lab 02/28/13 2025  WBC 10.1  HGB 12.9*  HCT 39.7  MCV 82.7  PLT 259   Cardiac Enzymes: No results found for this basename: CKTOTAL, CKMB, CKMBINDEX, TROPONINI,  in the last 168 hours  BNP (last 3 results) No results found for this basename: PROBNP,  in the last 8760 hours CBG: No results found for this basename: GLUCAP,  in the last 168 hours  Radiological  Exams on Admission: Dg Chest 1 View  02/28/2013   CLINICAL DATA:  Right hip fracture.  EXAM: CHEST - 1 VIEW  COMPARISON:  06/02/2011  FINDINGS: Mild cardiomegaly. There is been previous median sternotomy. Elevated left diaphragm which is chronic, often from previous cardiac surgery. Chronic fracture of upper sternal wires. No infiltrate or edema. No effusion or pneumothorax. No acute osseous findings.  IMPRESSION: 1. No active disease. 2. Prior CABG. 3. Chronic left diaphragmatic elevation.   Electronically Signed   By: Jorje Guild M.D.   On: 02/28/2013 21:10   Dg Hip Complete Right  02/28/2013   CLINICAL DATA:  Fall with right hip pain  EXAM: RIGHT HIP - COMPLETE 2+ VIEW  COMPARISON:  None.  FINDINGS: Right femoral neck fracture with varus angulation. The fracture is nearest to a transcervical type. The femoral head is located. Pelvic ring is intact. No significant degenerative changes to the acetabulum.  IMPRESSION: Acute right femoral neck fracture.   Electronically Signed   By: Jorje Guild M.D.   On: 02/28/2013 21:09    EKG: Independently reviewed. Mild PR prolongation, minimal if any ST elevation in inferior leads  Assessment/Plan Principal Problem:   Fracture of femoral neck, right, closed: For OR tomorrow afternoon Active Problems:   Hypertension   CAD (coronary artery  disease): Stable.    Hyperlipidemia: Stable.    DM (diabetes mellitus), type 2: Sliding scale every 4 hours while n.p.o. Metformin on hold.    Abnormal EKG: Repeat EKG and troponin in the morning    Transaminitis: Patient states he had a previous episode of this in the past, but was unsure of the cause. Recheck lab work Architectural technologist. Not on statin. Will check BNP for thoroughness    Code Status: Full code  Family Communication: No family present.  Disposition Plan: Here for several days and then skilled nursing facility  Time spent: 30 minutes  [facility] Pager [phone]

## 2013-03-01 NOTE — Care Management Note (Addendum)
    Page 1 of 2   03/03/2013     5:41:41 PM   CARE MANAGEMENT NOTE 03/03/2013  Patient:  [name] [name], [name] [name]   Account Number:  [id number]  Date Initiated:  03/01/2013  Documentation initiated by:  Sherrin Daisy  Subjective/Objective Assessment:   dx displaced femoral neck fx-rt; arthroplasty-bipolar hip     Action/Plan:   Surgery today/pt/ot pending/CM will follow   Anticipated DC Date:  03/03/2013   Anticipated DC Plan:  [place]  In-house referral  Clinical Social Worker      DC Forensic scientist  CM consult      [facility] Choice  HOME HEALTH   Choice offered to / List presented to:  C-1 Patient        [place] arranged  HH-2 PT      [place]   Status of service:  Completed, signed off Medicare Important Message given?   (If response is "NO", the following Medicare IM given date fields will be blank) Date Medicare IM given:   Date Additional Medicare IM given:    Discharge Disposition:  [place]  Per UR Regulation:  Reviewed for med. necessity/level of care/duration of stay  If discussed at [place] of Stay Meetings, dates discussed:    Comments:  03/03/2013 Sherrin Daisy BSN RN CCM [phone] Plans have changed to home with [facility] seervices. Plans states spouse will be caregiver. Pt already has DME and is requesting [facility] care for [place] services. Arville Go will be able to provide [facility] services upon pt's discharge.  03/02/2013 Sherrin Daisy BSN RN CCM [phone] Pt would like SNF rehab; CM will follow as needed.

## 2013-03-01 NOTE — Anesthesia Postprocedure Evaluation (Signed)
  Anesthesia Post-op Note  Patient: [name] [name]  Procedure(s) Performed: Procedure(s) (LRB): ARTHROPLASTY BIPOLAR HIP (Right)  Patient Location: PACU  Anesthesia Type: General  Level of Consciousness: awake and alert   Airway and Oxygen Therapy: Patient Spontanous Breathing  Post-op Pain: mild  Post-op Assessment: Post-op Vital signs reviewed, Patient's Cardiovascular Status Stable, Respiratory Function Stable, Patent Airway and No signs of Nausea or vomiting  Last Vitals:  Filed Vitals:   03/01/13 1739  BP: 163/98  Pulse: 95  Temp: 37.3 C  Resp: 15    Post-op Vital Signs: stable   Complications: No apparent anesthesia complications

## 2013-03-01 NOTE — Brief Op Note (Signed)
02/28/2013 - 03/01/2013  4:48 PM  PATIENT:  [name] [name]  68 y.o. male  PRE-OPERATIVE DIAGNOSIS:  Right Hip Osteoarthritis  POST-OPERATIVE DIAGNOSIS:  Right Hip Osteoarthritis  PROCEDURE:  Procedure(s): ARTHROPLASTY BIPOLAR HIP (Right)  SURGEON:  Surgeon(s) and Role:    * Alta Corning, MD - Primary  PHYSICIAN ASSISTANT:   ASSISTANTS: bethune   ANESTHESIA:   general  EBL:  Total I/O In: 2810 [P.O.:60; I.V.:2750] Out: 2950 [Urine:2250; Blood:700]  BLOOD ADMINISTERED:none  DRAINS: none   LOCAL MEDICATIONS USED:  MARCAINE     SPECIMEN:  No Specimen  DISPOSITION OF SPECIMEN:  N/A  COUNTS:  YES  TOURNIQUET:  * No tourniquets in log *  DICTATION: .Other Dictation: Dictation Number [id number]  PLAN OF CARE: Admit to inpatient   PATIENT DISPOSITION:  PACU - hemodynamically stable.   Delay start of Pharmacological VTE agent (>24hrs) due to surgical blood loss or risk of bleeding: no

## 2013-03-01 NOTE — Progress Notes (Signed)
Subjective: Painful r hip   Objective: Vital signs in last 24 hours: Temp:  [97.7 F (36.5 C)-97.9 F (36.6 C)] 97.9 F (36.6 C) (01/14 0710) Pulse Rate:  [85-88] 88 (01/14 0710) Resp:  [16-18] 16 (01/14 0710) BP: (139-179)/(82-97) 179/97 mmHg (01/14 0710) SpO2:  [93 %-96 %] 95 % (01/14 0710)  Intake/Output from previous day: 01/13 0701 - 01/14 0700 In: -  Out: 2300 [Urine:2300] Intake/Output this shift: Total I/O In: -  Out: 600 [Urine:600]   Recent Labs  02/28/13 2025  HGB 12.9*    Recent Labs  02/28/13 2025  WBC 10.1  RBC 4.80  HCT 39.7  PLT 259    Recent Labs  02/28/13 2025 03/01/13 0514  NA 132* 133*  K 3.1* 3.1*  CL 89* 92*  CO2 28 29  BUN 10 8  CREATININE 1.02 0.91  GLUCOSE 126* 123*  CALCIUM 8.6 8.2*    Recent Labs  02/28/13 2025  INR 0.89    Neurologically intact ABD soft Neurovascular intact Sensation intact distally Intact pulses distally No cellulitis present Compartment soft  Assessment/Plan: 68 yo male with fall and r hip displaced femoral neck fracture// will pllan on r bi-polar hip replacement today.     Neil Brickell L 03/01/2013, 8:05 AM

## 2013-03-01 NOTE — Anesthesia Preprocedure Evaluation (Addendum)
Anesthesia Evaluation  Patient identified by MRN, date of birth, ID band Patient awake    Reviewed: Allergy & Precautions, H&P , NPO status , Patient's Chart, lab work & pertinent test results  Airway Mallampati: II TM Distance: >3 FB Neck ROM: Full    Dental no notable dental hx.    Pulmonary former smoker,  Denies SOB. CXR reviewed. breath sounds clear to auscultation  Pulmonary exam normal       Cardiovascular hypertension, + CAD and + Past MI Rhythm:Regular Rate:Normal  Dobutamine stress test 03-08-12 reviewed. He has occasional chest pain.   Neuro/Psych negative neurological ROS  negative psych ROS   GI/Hepatic Neg liver ROS, GERD-  Medicated,  Endo/Other  diabetes, Type 2, Oral Hypoglycemic Agents  Renal/GU negative Renal ROS  negative genitourinary   Musculoskeletal negative musculoskeletal ROS (+)   Abdominal   Peds negative pediatric ROS (+)  Hematology negative hematology ROS (+)   Anesthesia Other Findings   Reproductive/Obstetrics negative OB ROS                          Anesthesia Physical Anesthesia Plan  ASA: III  Anesthesia Plan: General   Post-op Pain Management:    Induction: Intravenous  Airway Management Planned: Oral ETT  Additional Equipment:   Intra-op Plan:   Post-operative Plan: Extubation in OR  Informed Consent: I have reviewed the patients History and Physical, chart, labs and discussed the procedure including the risks, benefits and alternatives for the proposed anesthesia with the patient or authorized representative who has indicated his/her understanding and acceptance.   Dental advisory given  Plan Discussed with: CRNA  Anesthesia Plan Comments:        Anesthesia Quick Evaluation

## 2013-03-01 NOTE — Progress Notes (Addendum)
[facility] PROGRESS NOTE  [name] [name] GGY:[id number] DOB: March 01, 1945 DOA: 02/28/2013 PCP: Tommy Medal, MD   Brief narrative  68 y.o. male male  past medical history of CAD and is normally quite ambulatory and today tripped and fell on  his right side. The patient was unable to stand following this . He was brought into the emergency room where x-rays confirmed a right femoral neck fracture. Patient's restless labs were unremarkable except for some moderate transaminitis with a AST of 242 and ALT 128. Orthopedics consulted and patient taken to OR for  Bipolar right hip arthroplasty.   Assessment/Plan: Right femoral neck fracture Taken to OR today and had a bipolar right hip arthroplasty done. Pain control with Norco and Robaxin Bowel regimen Order PT/OT.  DVT prophylaxis per ortho  Hypertension Blood pressure stable. Will placed on when necessary hydralazine Continue Maxzide  CAD Continue aspirin and metoprolol.  GERD Continue PPI  Hypokalemia Continue home dose potassium. Monitor in a.m.  Diabetes mellitus Hold metformin. We'll place on sliding scale insulin  Transaminitis History of fatty liver as seen on previous ultrasound of the liver. Monitor a.m.   DVT prophylaxis Aspirin 325 mg twice a day  Diet: Diabetic   Code Status: Full code Family Communication: None at bedside Disposition: Pending PT eval. Likely needs skilled nursing facility   Consultants:  Orthopedics  Procedures:  Right hip arthroplasty on 1/14  Antibiotics:    HPI/Subjective: Patient seen and examined this morning. Complains of pain over right hip  Objective: Filed Vitals:   03/01/13 1739  BP: 163/98  Pulse: 95  Temp: 99.2 F (37.3 C)  Resp: 15    Intake/Output Summary (Last 24 hours) at 03/01/13 1754 Last data filed at 03/01/13 1715  Gross per 24 hour  Intake   2810 ml  Output   5475 ml  Net  -2665 ml   Filed Weights   03/01/13 1429  Weight: 106 kg (233 lb  11 oz)    Exam:   General:  Elderly male in no acute distress  HEENT: No pallor, moist oral mucosa  Chest: Clear to auscultation bilaterally, no added sound  CVS: Normal S1 and S2, no murmurs rub or gallop  Abdomen: Soft, nontender, nondistended, bowel sounds present  Extremities:right hip tender to palpation with limited ROM  CNS: AAO x3   Data Reviewed: Basic Metabolic Panel:  Recent Labs Lab 02/28/13 2025 03/01/13 0514  NA 132* 133*  K 3.1* 3.1*  CL 89* 92*  CO2 28 29  GLUCOSE 126* 123*  BUN 10 8  CREATININE 1.02 0.91  CALCIUM 8.6 8.2*   Liver Function Tests:  Recent Labs Lab 02/28/13 2025 03/01/13 0514  AST 242* 101*  ALT 128* 104*  ALKPHOS 215* 210*  BILITOT 0.4 0.5  PROT 7.3 6.8  ALBUMIN 3.7 3.5   No results found for this basename: LIPASE, AMYLASE,  in the last 168 hours No results found for this basename: AMMONIA,  in the last 168 hours CBC:  Recent Labs Lab 02/28/13 2025  WBC 10.1  HGB 12.9*  HCT 39.7  MCV 82.7  PLT 259   Cardiac Enzymes:  Recent Labs Lab 03/01/13 0514  TROPONINI <0.30   BNP (last 3 results)  Recent Labs  03/01/13 0514  PROBNP 163.7*   CBG:  Recent Labs Lab 03/01/13 0408 03/01/13 0754 03/01/13 1208 03/01/13 1340 03/01/13 1652  GLUCAP 135* 141* 144* 130* 165*    No results found for this or any previous visit (from the  past 240 hour(s)).   Studies: Dg Chest 1 View  02/28/2013   CLINICAL DATA:  Right hip fracture.  EXAM: CHEST - 1 VIEW  COMPARISON:  06/02/2011  FINDINGS: Mild cardiomegaly. There is been previous median sternotomy. Elevated left diaphragm which is chronic, often from previous cardiac surgery. Chronic fracture of upper sternal wires. No infiltrate or edema. No effusion or pneumothorax. No acute osseous findings.  IMPRESSION: 1. No active disease. 2. Prior CABG. 3. Chronic left diaphragmatic elevation.   Electronically Signed   By: Jorje Guild M.D.   On: 02/28/2013 21:10   Dg Hip  Complete Right  02/28/2013   CLINICAL DATA:  Fall with right hip pain  EXAM: RIGHT HIP - COMPLETE 2+ VIEW  COMPARISON:  None.  FINDINGS: Right femoral neck fracture with varus angulation. The fracture is nearest to a transcervical type. The femoral head is located. Pelvic ring is intact. No significant degenerative changes to the acetabulum.  IMPRESSION: Acute right femoral neck fracture.   Electronically Signed   By: Jorje Guild M.D.   On: 02/28/2013 21:09   Dg Pelvis Portable  03/01/2013   CLINICAL DATA:  Postop right hip.  EXAM: PORTABLE PELVIS 1-2 VIEWS  COMPARISON:  None.  FINDINGS: Patient has undergone right bipolar hip arthroplasty. No evidence for acute fracture or dislocation on this frontal view. Regional bowel gas pattern is nonobstructive. Marland Kitchen  IMPRESSION: Status post right hip arthroplasty.  No adverse features identified.   Electronically Signed   By: Shon Hale M.D.   On: 03/01/2013 17:42   Dg Hip Portable 1 View Right  03/01/2013   CLINICAL DATA:  Postop right hip.  EXAM: PORTABLE RIGHT HIP - 1 VIEW  COMPARISON:  02/28/2013  FINDINGS: Patient has undergone right hip arthroplasty. There is no evidence for dislocation or acute fracture. Postoperative gas overlies the soft tissues of the hip.  IMPRESSION: Status post right hip arthroplasty.  No adverse features.   Electronically Signed   By: Shon Hale M.D.   On: 03/01/2013 17:47    Scheduled Meds: . allopurinol  300 mg Oral Daily  . aspirin EC  325 mg Oral BID PC  . B-complex with vitamin C  1 tablet Oral Daily  .  ceFAZolin (ANCEF) IV  2 g Intravenous Q6H  . cholecalciferol  1,000 Units Oral Daily  . docusate sodium  100 mg Oral BID  . [START ON 03/02/2013] ferrous sulfate  325 mg Oral BID WC  . gabapentin  300 mg Oral TID  . insulin aspart  0-9 Units Subcutaneous Q4H  . ketorolac  15 mg Intravenous Q6H  . magnesium oxide  400 mg Oral Daily  . metFORMIN  500 mg Oral QPM  . metoprolol succinate  100 mg Oral Daily  .  pantoprazole  40 mg Oral Daily  . potassium gluconate  595 mg Oral Daily  . triamterene-hydrochlorothiazide  1 tablet Oral Daily  . [START ON 03/02/2013] venlafaxine XR  75 mg Oral Q breakfast   Continuous Infusions: . sodium chloride 100 mL/hr at 03/01/13 1718      Time spent: 25 minutes    Telesha Deguzman, [facility] Pager [phone]  If 7PM-7AM, please contact night-coverage at [email or web address], password [facility] 03/01/2013, 5:54 PM  LOS: 1 day

## 2013-03-01 NOTE — Transfer of Care (Signed)
Immediate Anesthesia Transfer of Care Note  Patient: [name] [name]  Procedure(s) Performed: Procedure(s): ARTHROPLASTY BIPOLAR HIP (Right)  Patient Location: PACU  Anesthesia Type:General  Level of Consciousness: sedated  Airway & Oxygen Therapy: Patient Spontanous Breathing and Patient connected to face mask oxygen  Post-op Assessment: Report given to PACU RN and Post -op Vital signs reviewed and stable  Post vital signs: Reviewed and stable  Complications: No apparent anesthesia complications

## 2013-03-01 NOTE — Preoperative (Signed)
Beta Blockers   Reason not to administer Beta Blockers:Not Applicable 

## 2013-03-02 ENCOUNTER — Encounter (HOSPITAL_COMMUNITY): Payer: Self-pay | Admitting: Orthopedic Surgery

## 2013-03-02 DIAGNOSIS — R7989 Other specified abnormal findings of blood chemistry: Secondary | ICD-10-CM

## 2013-03-02 LAB — GLUCOSE, CAPILLARY
GLUCOSE-CAPILLARY: 144 mg/dL — AB (ref 70–99)
GLUCOSE-CAPILLARY: 128 mg/dL — AB (ref 70–99)
GLUCOSE-CAPILLARY: 126 mg/dL — AB (ref 70–99)

## 2013-03-02 LAB — CBC
HCT: 32.1 % — ABNORMAL LOW (ref 39.0–52.0)
Hemoglobin: 10.3 g/dL — ABNORMAL LOW (ref 13.0–17.0)
MCH: 26.8 pg (ref 26.0–34.0)
MCHC: 32.1 g/dL (ref 30.0–36.0)
MCV: 83.4 fL (ref 78.0–100.0)
Platelets: 210 10*3/uL (ref 150–400)
RBC: 3.85 MIL/uL — ABNORMAL LOW (ref 4.22–5.81)
RDW: 16.7 % — ABNORMAL HIGH (ref 11.5–15.5)
WBC: 14.4 10*3/uL — AB (ref 4.0–10.5)

## 2013-03-02 LAB — HEPATIC FUNCTION PANEL
ALBUMIN: 2.8 g/dL — AB (ref 3.5–5.2)
ALK PHOS: 178 U/L — AB (ref 39–117)
ALT: 81 U/L — ABNORMAL HIGH (ref 0–53)
AST: 73 U/L — AB (ref 0–37)
Bilirubin, Direct: <0.2 mg/dL (ref 0.0–0.3)
Total Bilirubin: 0.4 mg/dL (ref 0.3–1.2)
Total Protein: 5.9 g/dL — ABNORMAL LOW (ref 6.0–8.3)

## 2013-03-02 LAB — BASIC METABOLIC PANEL
BUN: 13 mg/dL (ref 6–23)
CHLORIDE: 94 meq/L — AB (ref 96–112)
CO2: 24 mEq/L (ref 19–32)
Calcium: 8 mg/dL — ABNORMAL LOW (ref 8.4–10.5)
Creatinine, Ser: 1.01 mg/dL (ref 0.50–1.35)
GFR calc Af Amer: 86 mL/min — ABNORMAL LOW (ref >90)
GFR calc non Af Amer: 74 mL/min — ABNORMAL LOW (ref >90)
Glucose, Bld: 156 mg/dL — ABNORMAL HIGH (ref 70–99)
Potassium: 4.2 mEq/L (ref 3.7–5.3)
Sodium: 134 mEq/L — ABNORMAL LOW (ref 137–147)

## 2013-03-02 LAB — TROPONIN I

## 2013-03-02 NOTE — Progress Notes (Signed)
03/02/2013 Sherrin Daisy BSN RN CCM [phone] Pt would like SNF rehab; CM will follow as needed.

## 2013-03-02 NOTE — Progress Notes (Addendum)
CSW assisting with d/c planning. Request for SNF authorization has made to [facility] ( at approx.1:30 pm.) Clinicals have been sent. Pt has accepted SNF bed at [facility] ( [facility] ). CSW has notified insurance that MD would like to d/c pt [facility] .CSW will continue to follow to assist with d/c planning.  Werner Lean LCSW [phone]  4:13  CSW spoke with [facility] to discuss possibility of d/c pt to them [facility], if pt is stable for d/c, prior to insurance authorization. [facility] is willing to admit provided pt provides a check for $500 on admission. SNF will not cash this check unless insurance does not cover admission. This was discussed with pt/spouse. They will consider this option and CSW will speak with them in the am. Spouse is also considering taking pt home with [facility] services and having pt go to [facility] from home ( if needed ) , once authorization is provided. CSW will continue to follow.  Werner Lean LCSW [phone]

## 2013-03-02 NOTE — Progress Notes (Signed)
CSW assisting with d/c planning. Pt has [employer] which requires prior authorization for SNF placement.. This process cannot be started until PT EVAL and recommendations are available. Authorization process takes 2-3 business days. CSW will request auth once PT has seen pt.  Werner Lean LCSW [phone]

## 2013-03-02 NOTE — Progress Notes (Signed)
Subjective: 1 Day Post-Op Procedure(s) (LRB): ARTHROPLASTY BIPOLAR HIP (Right) Patient reports pain as mild.    Objective: Vital signs in last 24 hours: Temp:  [97.6 F (36.4 C)-99.2 F (37.3 C)] 97.6 F (36.4 C) (01/15 0559) Pulse Rate:  [75-95] 75 (01/15 0559) Resp:  [14-16] 16 (01/15 0559) BP: (122-163)/(71-98) 122/72 mmHg (01/15 0559) SpO2:  [95 %-100 %] 99 % (01/15 0559) Weight:  [106 kg (233 lb 11 oz)] 106 kg (233 lb 11 oz) (01/14 1429)  Intake/Output from previous day: 01/14 0701 - 01/15 0700 In: 2810 [P.O.:60; I.V.:2750] Out: 4250 [Urine:3550; Blood:700] Intake/Output this shift: Total I/O In: -  Out: 75 [Urine:75]   Recent Labs  02/28/13 2025 03/02/13 0508  HGB 12.9* 10.3*    Recent Labs  02/28/13 2025 03/02/13 0508  WBC 10.1 14.4*  RBC 4.80 3.85*  HCT 39.7 32.1*  PLT 259 210    Recent Labs  03/01/13 0514 03/02/13 0508  NA 133* 134*  K 3.1* 4.2  CL 92* 94*  CO2 29 24  BUN 8 13  CREATININE 0.91 1.01  GLUCOSE 123* 156*  CALCIUM 8.2* 8.0*    Recent Labs  02/28/13 2025  INR 0.89    Neurologically intact ABD soft Neurovascular intact Sensation intact distally Intact pulses distally No cellulitis present Compartment soft  Assessment/Plan: 1 Day Post-Op Procedure(s) (LRB): ARTHROPLASTY BIPOLAR HIP (Right) Advance diet Up with therapy Plan for discharge tomorrow Discharge to SNF spoke with patient and d/c tomorrow to SNF is plan if arrangements can be made   Sunya Humbarger L 03/02/2013, 8:19 AM

## 2013-03-02 NOTE — Progress Notes (Signed)
[facility] PROGRESS NOTE  [name] [name] [id number] DOB: [birth year] DOA: 02/28/2013 PCP: Tommy Medal, MD   Brief narrative 68 y.o. male male past medical history of CAD and is normally quite ambulatory and today tripped and fell on his right side. The patient was unable to stand following this . He was brought into the emergency room where x-rays confirmed a right femoral neck fracture. Patient's admission  labs were unremarkable except for some moderate transaminitis with a AST of 242 and ALT 128. Orthopedics consulted and patient taken to OR for Bipolar right hip arthroplasty.  Assessment/Plan: Right femoral neck fracture  S/p  bipolar right hip arthroplasty done.  Pain control with Norco and Robaxin  Bowel regimen  PT/OT recommend skilled nursing facility -Discontinue Foley DVT prophylaxis per ortho   Hypertension  Blood pressure stable. Will placed on when necessary hydralazine  Continue Maxzide   CAD  Continue aspirin and metoprolol.   GERD  Continue PPI  Patient had an episode of epigastric discomfort this morning likely secondary to GERD which improved with a dose of Maalox. EKG and 1 set of troponin negative.  Hypokalemia  Continue home dose potassium. Improved this a.m.  Diabetes mellitus  Hold metformin.  on sliding scale insulin   Transaminitis  History of fatty liver as seen on previous ultrasound of the liver. Slowly improved in am.  DVT prophylaxis  Aspirin 325 mg twice a day   Diet: Diabetic   Code Status: Full code   Family Communication: None at bedside   Disposition:  skilled nursing facility likely tomorrow  Consultants:  Orthopedics  Procedures:  Right hip arthroplasty on 1/14   Antibiotics:  HPI/Subjective:  Patient seen and examined this morning. Complained of epigastric discomfort. EKG done was unremarkable. Received a dose of Maalox and sublingual nitrate after which his pain subsided. Troponin  negative.   Objective: Filed Vitals:   03/02/13 0909  BP: 156/66  Pulse: 86  Temp: 98.3 F (36.8 C)  Resp: 16    Intake/Output Summary (Last 24 hours) at 03/02/13 1311 Last data filed at 03/02/13 1149  Gross per 24 hour  Intake   2990 ml  Output   4200 ml  Net  -1210 ml   Filed Weights   03/01/13 1429  Weight: 106 kg (233 lb 11 oz)    Exam:  General: Elderly male in no acute distress  HEENT: No pallor, moist oral mucosa  Chest: Clear to auscultation bilaterally, no added sound  CVS: Normal S1 and S2, no murmurs rub or gallop  Abdomen: Soft, nontender, nondistended, bowel sounds present  Extremities:dressing  over right hip ROM CNS: AAO x3  Data Reviewed: Basic Metabolic Panel:  Recent Labs Lab 02/28/13 2025 03/01/13 0514 03/02/13 0508  NA 132* 133* 134*  K 3.1* 3.1* 4.2  CL 89* 92* 94*  CO2 28 29 24   GLUCOSE 126* 123* 156*  BUN 10 8 13   CREATININE 1.02 0.91 1.01  CALCIUM 8.6 8.2* 8.0*   Liver Function Tests:  Recent Labs Lab 02/28/13 2025 03/01/13 0514 03/02/13 0508  AST 242* 101* 73*  ALT 128* 104* 81*  ALKPHOS 215* 210* 178*  BILITOT 0.4 0.5 0.4  PROT 7.3 6.8 5.9*  ALBUMIN 3.7 3.5 2.8*   No results found for this basename: LIPASE, AMYLASE,  in the last 168 hours No results found for this basename: AMMONIA,  in the last 168 hours CBC:  Recent Labs Lab 02/28/13 2025 03/02/13 0508  WBC 10.1 14.4*  HGB 12.9* 10.3*  HCT 39.7 32.1*  MCV 82.7 83.4  PLT 259 210   Cardiac Enzymes:  Recent Labs Lab 03/01/13 0514 03/02/13 0925  TROPONINI <0.30 <0.30   BNP (last 3 results)  Recent Labs  03/01/13 0514  PROBNP 163.7*   CBG:  Recent Labs Lab 03/01/13 1340 03/01/13 1652 03/01/13 2105 03/02/13 0717 03/02/13 1132  GLUCAP 130* 165* 197* 144* 128*    No results found for this or any previous visit (from the past 240 hour(s)).   Studies: Dg Chest 1 View  02/28/2013   CLINICAL DATA:  Right hip fracture.  EXAM: CHEST - 1 VIEW   COMPARISON:  06/02/2011  FINDINGS: Mild cardiomegaly. There is been previous median sternotomy. Elevated left diaphragm which is chronic, often from previous cardiac surgery. Chronic fracture of upper sternal wires. No infiltrate or edema. No effusion or pneumothorax. No acute osseous findings.  IMPRESSION: 1. No active disease. 2. Prior CABG. 3. Chronic left diaphragmatic elevation.   Electronically Signed   By: Jorje Guild M.D.   On: 02/28/2013 21:10   Dg Hip Complete Right  02/28/2013   CLINICAL DATA:  Fall with right hip pain  EXAM: RIGHT HIP - COMPLETE 2+ VIEW  COMPARISON:  None.  FINDINGS: Right femoral neck fracture with varus angulation. The fracture is nearest to a transcervical type. The femoral head is located. Pelvic ring is intact. No significant degenerative changes to the acetabulum.  IMPRESSION: Acute right femoral neck fracture.   Electronically Signed   By: Jorje Guild M.D.   On: 02/28/2013 21:09   Dg Pelvis Portable  03/01/2013   CLINICAL DATA:  Postop right hip.  EXAM: PORTABLE PELVIS 1-2 VIEWS  COMPARISON:  None.  FINDINGS: Patient has undergone right bipolar hip arthroplasty. No evidence for acute fracture or dislocation on this frontal view. Regional bowel gas pattern is nonobstructive. Marland Kitchen  IMPRESSION: Status post right hip arthroplasty.  No adverse features identified.   Electronically Signed   By: Shon Hale M.D.   On: 03/01/2013 17:42   Dg Hip Portable 1 View Right  03/01/2013   CLINICAL DATA:  Postop right hip.  EXAM: PORTABLE RIGHT HIP - 1 VIEW  COMPARISON:  02/28/2013  FINDINGS: Patient has undergone right hip arthroplasty. There is no evidence for dislocation or acute fracture. Postoperative gas overlies the soft tissues of the hip.  IMPRESSION: Status post right hip arthroplasty.  No adverse features.   Electronically Signed   By: Shon Hale M.D.   On: 03/01/2013 17:47    Scheduled Meds: . allopurinol  300 mg Oral Daily  . aspirin EC  325 mg Oral BID PC  .  B-complex with vitamin C  1 tablet Oral Daily  . cholecalciferol  1,000 Units Oral Daily  . docusate sodium  100 mg Oral BID  . ferrous sulfate  325 mg Oral BID WC  . gabapentin  300 mg Oral TID  . insulin aspart  0-15 Units Subcutaneous TID WC  . magnesium oxide  400 mg Oral Daily  . metoprolol succinate  100 mg Oral Daily  . pantoprazole  40 mg Oral Daily  . potassium gluconate  595 mg Oral Daily  . triamterene-hydrochlorothiazide  1 tablet Oral Daily  . venlafaxine XR  75 mg Oral Q breakfast   Continuous Infusions: . sodium chloride 100 mL/hr at 03/02/13 0219     Time spent: 25 minutes    Duha Abair, [place]  [facility] Pager [phone]. If 7PM-7AM, please contact night-coverage at [email or web address], password [facility]  03/02/2013, 1:11 PM  LOS: 2 days

## 2013-03-02 NOTE — Op Note (Signed)
NAME:  [name] [name], [name] [name]                  ACCOUNT NO.:  [id number]  MEDICAL RECORD NO.:  [id number]  LOCATION:  5409                         FACILITY:  [facility]  PHYSICIAN:  Alta Corning, M.D.   DATE OF BIRTH:  06/15/1945  DATE OF PROCEDURE:  03/01/2013 DATE OF DISCHARGE:                              OPERATIVE REPORT   PREOPERATIVE DIAGNOSIS:  Femoral neck fracture displaced, right.  POSTOPERATIVE DIAGNOSIS:  Femoral neck fracture displaced, right.  PROCEDURE:  Right cemented hemiarthroplasty with a DePuy cemented stem, size 5, a 54-mm monopolar ball with a +10 neck segment.  SURGEON:  Alta Corning, M.D.  ASSISTANT:  Gary Fleet, P.A.  ANESTHESIA:  General.  BRIEF HISTORY:  Mr. Thain is a 68 year old male with a history of having had significant complaints of having fallen yesterday.  He was admitted to the hospital with femoral neck fracture.  We had a long discussion of treatment options.  We felt that hemiarthroplasty was the appropriate course of action.  He was taken to the operating room for this procedure.  PROCEDURE:  The patient was taken to the operating room and after adequate anesthesia was obtained with general anesthetic, the patient placed supine on the operating table and then moved into the left lateral decubitus position.  All bony prominences were well padded. Attention was then turned to the right hip where after routine prep and drape, the incision was made little more lateral than we typically go because there were some concerns about the patient having anal fistula and needed sitz baths after bowel movements and we felt that we would try to get the wound up a little higher.  This wound was moved a little bit lateral and following this subcutaneous tissues down the level of the gluteus musculature a small incision was made in the fascia of the gluteus musculature and then the muscle was finger fractured and the Charnley retractor was put in place.  The  tissue quality seemed to be poor and infiltrated with fat at this point.  At any rate, we internally rotated the hip at this point, put our retractors superior and inferior and then took down the short external rotators in the piriformis as a group and tagged these.  Attention then turned to provisional neck cut, which was made and following this, attention was turned towards removal of the head, it was removed, sized on the back table.  It was between 51 and 32, tried a 31 and that went nicely and tried a 55 and that also went down and felt like this might be the appropriate size.  Once this was completed, attention was turned to the stem side and was sequentially rasped up to a level of 5 and we then elected to cement the size 5 in.  At this point, a Cementralizer was chosen as well as a Pharmacologist and this was placed.  The cement was then mixed and put in a gun.  At this point, the canal was filled with cement and pressurized.  Following this, a size 5 stem was placed with a collar, which sat down.  At this point, attention was turned towards trial reduction.  Once this was allowed to completely harden, we re-trialed a +5 bipolar ball 55 as this has been what we had used preoperatively. This gave less than adequate stability.  We had opened the 55 bipolar given that we had this work with the trial.  With the final product, it seemed to be as stable as we would like it to be.  There were issues that were attempted.  We tried a 54 ball to see if that would seat better because we felt like it was not seating as well as I would like, 54 ball seated what appeared to be slightly better, and at that point we knew we are going to have to open a new product so we did some trialing, went up to a +10 with the bipolar, and this really still did not give me stability, I tried with a monopolar and for whatever reason this had much markedly better stability, it still was not clear exactly why  the stability was better, but ultimately we elected to take a +10 with the 54 monopolar ball, this was opened and placed.  At this point, reasonable stability was achieved, 90 degrees of flexion with 60 degrees of internal rotation prior to any tendency towards dislocation.  At this point, attention was turned towards repairing the short external rotators and piriformis to the posterior intertrochanteric line and the tensor fascia was closed with 1 Vicryl, the skin with 0 and 2-0 Vicryl, and skin staples.  20 mL of Exparel with 10 of saline was instilled in and around the capsular area and also in and around the subcutaneous fat to give [place] excellent closure of the Exparel.  At this point, the wound was irrigated, suctioned dry.  Sterile compressive dressing was applied after the closure, and the patient was taken to the recovery room and was noted to be in a satisfactory condition.  Estimated blood loss for the procedure was 500 mL, but the final numbers can be achieved from the anesthetic record.     Alta Corning, M.D.     Corliss Skains  D:  03/01/2013  T:  03/02/2013  Job:  [id number]

## 2013-03-02 NOTE — Evaluation (Signed)
Occupational Therapy Evaluation Patient Details Name: [name] [name] MRN: [id number] DOB: January 10, 1946 Today's Date: 03/02/2013 Time: 3818-2993 OT Time Calculation (min): 23 min  OT Assessment / Plan / Recommendation History of present illness Femoral neck fracture displaced, right. s/p right hip hemiarthroplasty   Clinical Impression   Pt presents to OT with decreased I with ADL activity s/p fall. Pt will benefit from skilled OT to address ADL activity with hip precautions ( AE, etc)    OT Assessment  Patient needs continued OT Services    Follow Up Recommendations  SNF       Equipment Recommendations  None recommended by OT (TBD post SNF)       Frequency  Min 2X/week    Precautions / Restrictions Precautions Precautions: Posterior Hip;Fall Precaution Comments: sign in room; reviewed with pt Restrictions RLE Weight Bearing: Weight bearing as tolerated       ADL  Grooming: Min guard Where Assessed - Grooming: Supported standing Upper Body Bathing: Set up Where Assessed - Upper Body Bathing: Unsupported sitting Lower Body Bathing: Maximal assistance Where Assessed - Lower Body Bathing: Supported sit to stand Upper Body Dressing: Min guard Where Assessed - Upper Body Dressing: Unsupported sitting Lower Body Dressing: Maximal assistance Where Assessed - Lower Body Dressing: Supported sit to Lobbyist: Minimal assistance Toilet Transfer Method: Sit to Loss adjuster, chartered: Comfort height toilet Toileting - Clothing Manipulation and Hygiene: Moderate assistance Where Assessed - Toileting Clothing Manipulation and Hygiene: Sit to stand from 3-in-1 or toilet Transfers/Ambulation Related to ADLs: Pt needed to urgently get to the bathroom when OT arrived.  Educated on AE in which wife will obtain.  Pt and wife want a rehab setting post acute stay to increase pts I prior to coming home      OT Goals(Current goals can be found in the care plan  section) Acute Rehab OT Goals Patient Stated Goal: to get better fast OT Goal Formulation: With patient Time For Goal Achievement: 03/16/13  Visit Information  Last OT Received On: 03/02/13 Assistance Needed: +1 History of Present Illness: Femoral neck fracture displaced, right. s/p right hip hemiarthroplasty       Prior [place] expects to be discharged to:: Private residence Living Arrangements: Spouse/significant other Available Help at Discharge: Family Type of Home: House Home Access: Stairs to enter Technical brewer of Steps: 3 Home Layout: One level Home Equipment: Environmental consultant - standard Additional Comments: wife is retired Therapist, nutritional Level of Independence: Independent Communication Communication: No difficulties         Vision/Perception Vision - History Patient Visual Report: No change from baseline   Cognition  Cognition Arousal/Alertness: Awake/alert Behavior During Therapy: WFL for tasks assessed/performed Overall Cognitive Status: Within Functional Limits for tasks assessed    Extremity/Trunk Assessment Upper Extremity Assessment Upper Extremity Assessment: Overall WFL for tasks assessed Lower Extremity Assessment Lower Extremity Assessment: RLE deficits/detail RLE Deficits / Details: hip flexion limited due to pain; right knee painful since fall; some swelling distal to patella     Mobility Bed Mobility Overal bed mobility: Needs Assistance Bed Mobility: Supine to Sit Supine to sit: Min assist;Mod assist General bed mobility comments: incr time and cues to self assist Transfers Overall transfer level: Needs assistance Equipment used: Rolling walker (2 wheeled) Transfers: Sit to/from Stand Sit to Stand: Min assist;From elevated surface General transfer comment: cues for hand placement and hip precautions with  End of Session OT - End of Session Activity Tolerance: Patient tolerated  treatment well Patient left: in chair Nurse Communication: Mobility status  GO     Betsy Pries 03/02/2013, 1:35 PM

## 2013-03-02 NOTE — Evaluation (Signed)
Physical Therapy Evaluation Patient Details Name: [name] [name] MRN: [id number] DOB: 17-Dec-1945 Today's Date: 03/02/2013 Time: 1203-1220 PT Time Calculation (min): 17 min  PT Assessment / Plan / Recommendation History of Present Illness  Femoral neck fracture displaced, right. s/p right hip hemiarthroplasty  Clinical Impression  Pt will benefit from PT to address deficits below; He is planning for SNF post acute and I agree with this plan    PT Assessment  Patient needs continued PT services    Follow Up Recommendations  SNF    Does the patient have the potential to tolerate intense rehabilitation      Barriers to Discharge        Equipment Recommendations  None recommended by PT    Recommendations for Other Services     Frequency Min 6X/week    Precautions / Restrictions Precautions Precautions: Posterior Hip Precaution Comments: sign in room; reviewed with pt Restrictions RLE Weight Bearing: Weight bearing as tolerated   Pertinent Vitals/Pain "Pain worse than being shot" Pt was premedicated and willing to do therapy      Mobility  Bed Mobility Overal bed mobility: Needs Assistance Bed Mobility: Supine to Sit Supine to sit: Min assist;Mod assist General bed mobility comments: incr time and cues to self assist Transfers Overall transfer level: Needs assistance Equipment used: Rolling walker (2 wheeled) Transfers: Sit to/from Stand Sit to Stand: Min assist;From elevated surface General transfer comment: cues for hand placement and hip precautions Ambulation/Gait Ambulation/Gait assistance: Min assist Ambulation Distance (Feet): 40 Feet Assistive device: Rolling walker (2 wheeled) Gait Pattern/deviations: Step-to pattern General Gait Details: cues for sequence and RW distance from self    Exercises     PT Diagnosis: Difficulty walking  PT Problem List: Decreased mobility;Decreased range of motion;Decreased strength;Decreased activity tolerance;Decreased  balance;Decreased knowledge of use of DME;Decreased knowledge of precautions PT Treatment Interventions: DME instruction;Gait training;Functional mobility training;Therapeutic activities;Therapeutic exercise;Patient/family education     PT Goals(Current goals can be found in the care plan section) Acute Rehab PT Goals Patient Stated Goal: to get better fast PT Goal Formulation: With patient Time For Goal Achievement: 03/09/13 Potential to Achieve Goals: Good  Visit Information  Last PT Received On: 03/02/13 Assistance Needed: +1 History of Present Illness: Femoral neck fracture displaced, right. s/p right hip hemiarthroplasty       Prior [place] expects to be discharged to:: Private residence Living Arrangements: Spouse/significant other Available Help at Discharge: Family Type of Home: House Home Access: Stairs to enter Technical brewer of Steps: 3 Home Layout: One level Home Equipment: Environmental consultant - standard Additional Comments: wife is retired Therapist, sports Prior Function Level of Independence: Independent Communication Communication: No difficulties    Cognition  Cognition Arousal/Alertness: Awake/alert Behavior During Therapy: WFL for tasks assessed/performed Overall Cognitive Status: Within Functional Limits for tasks assessed    Extremity/Trunk Assessment Upper Extremity Assessment Upper Extremity Assessment: Defer to OT evaluation;Overall WFL for tasks assessed Lower Extremity Assessment Lower Extremity Assessment: RLE deficits/detail RLE Deficits / Details: hip flexion limited due to pain; right knee painful since fall; some swelling distal to patella   Balance    End of Session PT - End of Session Equipment Utilized During Treatment: Gait belt Activity Tolerance: Patient tolerated treatment well Patient left: in chair;with call bell/phone within reach;with family/visitor present  GP     [facility] 03/02/2013, 1:00 PM

## 2013-03-03 LAB — CBC
HCT: 30.6 % — ABNORMAL LOW (ref 39.0–52.0)
HEMOGLOBIN: 9.6 g/dL — AB (ref 13.0–17.0)
MCH: 26.4 pg (ref 26.0–34.0)
MCHC: 31.4 g/dL (ref 30.0–36.0)
MCV: 84.3 fL (ref 78.0–100.0)
Platelets: 236 10*3/uL (ref 150–400)
RBC: 3.63 MIL/uL — ABNORMAL LOW (ref 4.22–5.81)
RDW: 17.4 % — AB (ref 11.5–15.5)
WBC: 12.7 10*3/uL — ABNORMAL HIGH (ref 4.0–10.5)

## 2013-03-03 LAB — GLUCOSE, CAPILLARY
GLUCOSE-CAPILLARY: 126 mg/dL — AB (ref 70–99)
Glucose-Capillary: 181 mg/dL — ABNORMAL HIGH (ref 70–99)
Glucose-Capillary: 119 mg/dL — ABNORMAL HIGH (ref 70–99)

## 2013-03-03 MED ORDER — DSS 100 MG PO CAPS: 100.0000 mg | ORAL_CAPSULE | Freq: Two times a day (BID) | ORAL | Status: DC | PRN

## 2013-03-03 MED ORDER — HYDROMORPHONE HCL 2 MG PO TABS: 2.0000 mg | ORAL_TABLET | ORAL | Status: DC | PRN

## 2013-03-03 NOTE — Progress Notes (Signed)
CSW met with pt / ortho pa this am to assist with d/c planning. Pt now plans to d/c home with [facility] services . CSW will update RNCM, [employer], [employer].  Werner Lean LCSW [phone]

## 2013-03-03 NOTE — Discharge Summary (Signed)
Physician Discharge Summary  [name] [name] TKW:[id number] DOB: [birth year] DOA: 02/28/2013  PCP: Tommy Medal, MD  Admit date: 02/28/2013 Discharge date: 03/03/2013  Time spent: 40 minutes  Recommendations for Outpatient Follow-up:  Discharged home with home health PT Follow up with orthopedics in 2 weeks -Followup with PCP in 2 weeks. These monitor LFTs as outpatient  Discharge Diagnoses:  Principal Problem:   Fracture of femoral neck, right, closed  Active Problems:   Hypertension   CAD (coronary artery disease)   Hyperlipidemia   DM (diabetes mellitus), type 2   Transaminitis   Discharge Condition: Fair  Diet recommendation: Diabetic  Filed Weights   03/01/13 1429  Weight: 106 kg (233 lb 11 oz)    History of present illness:  Please refer to admission H&P for details but in brief, 68 y.o. male male past medical history of CAD and is normally quite ambulatory and today tripped and fell on his right side. The patient was unable to stand following this . He was brought into the emergency room where x-rays confirmed a right femoral neck fracture. Patient's admission labs were unremarkable except for some moderate transaminitis with a AST of 242 and ALT 128. Orthopedics consulted and patient taken to OR for Bipolar right hip arthroplasty.   Hospital Course:  Right femoral neck fracture  S/p bipolar right hip arthroplasty done on 1/14.  Pain control per orthopedics with when necessary Dilaudid as outpatient. Robaxin when necessary for muscle spasms. -When necessary docusate prescribed for bowel regimen PT/OT recommend skilled nursing facility but patient wishes to go home with home health. DVT prophylaxis per ortho with full dose aspirin 325 mg twice a day for 4 weeks. After that he can resume his home dose daily aspirin.  Hypertension  Blood pressure stable.   CAD  Continue aspirin and metoprolol.   GERD  Continue PPI  Patient had an episode of epigastric discomfort  on 1/14 secondary to GERD which improved with a dose of Maalox. EKG and 1 set of troponin negative. He had some ST changes on admission EKG and monitor on telemetry. However on reviewing his previous EKGs this appears unchanged.  Hypokalemia  Continue home dose potassium. Replenished blood in the hospital  Diabetes mellitus  Resume home dose metformin  Transaminitis  History of fatty liver as seen on previous ultrasound of the liver. Improving subsequent labs. Patient counseled on alcohol cessation. Needs LFTs followed as outpatient  DVT prophylaxis  Aspirin 325 mg twice a day for 4 weeks  Diet: Diabetic   Code Status: Full code  Family Communication: None at bedside  Disposition:  Home with home health PT Consultants:  Orthopedics  Procedures:  Right hip arthroplasty on 1/14    Discharge Exam: Filed Vitals:   03/03/13 0547  BP: 145/77  Pulse: 66  Temp: 98.6 F (37 C)  Resp: 17    General: Elderly male in no acute distress  HEENT: No pallor, moist oral mucosa  Chest: Clear to auscultation bilaterally, no added sound  CVS: Normal S1 and S2, no murmurs rub or gallop  Abdomen: Soft, nontender, nondistended, bowel sounds present  Extremities:dressing over right hip  ROM CNS: AAO x3   Discharge Instructions  Discharge Orders   Future Orders Complete By Expires   Consult to care management  As directed    Comments:     Needs HHPT  HHOT WBAT on right with posterior hip precautions. For discharge today.   Weight bearing as tolerated  As directed  Questions:     Laterality:     Extremity:         Medication List    STOP taking these medications       aspirin 325 MG tablet  Replaced by:  aspirin EC 325 MG tablet     HYDROcodone-acetaminophen 10-325 MG per tablet  Commonly known as:  NORCO      TAKE these medications       allopurinol 300 MG tablet  Commonly known as:  ZYLOPRIM  Take 300 mg by mouth daily.     aspirin EC 325 MG tablet  Take 1  tablet (325 mg total) by mouth 2 (two) times daily after a meal. Take x 1 month post op.     B-complex with vitamin C tablet  Take 1 tablet by mouth daily.     cholecalciferol 1000 UNITS tablet  Commonly known as:  VITAMIN D  Take 1,000 Units by mouth daily.     CINNAMON PO  Take by mouth.     clonazePAM 1 MG tablet  Commonly known as:  KLONOPIN  Take 1 mg by mouth 2 (two) times daily as needed for anxiety.     cyclobenzaprine 10 MG tablet  Commonly known as:  FLEXERIL  Take 10 mg by mouth daily.     DSS 100 MG Caps  Take 100 mg by mouth 2 (two) times daily as needed for mild constipation.     esomeprazole 40 MG capsule  Commonly known as:  NEXIUM  Take 40 mg by mouth daily before breakfast.     furosemide 20 MG tablet  Commonly known as:  LASIX  Take 1 tablet (20 mg total) by mouth as needed.     gabapentin 300 MG capsule  Commonly known as:  NEURONTIN  Take 300 mg by mouth 3 (three) times daily.     HYDROmorphone 2 MG tablet  Commonly known as:  DILAUDID  Take 1-2 tablets (2-4 mg total) by mouth every 4 (four) hours as needed for severe pain.     MAGNESIUM OXIDE PO  Take 1 capsule by mouth every evening.     metFORMIN 500 MG (MOD) 24 hr tablet  Commonly known as:  GLUMETZA  Take 500 mg by mouth every evening.     methocarbamol 750 MG tablet  Commonly known as:  ROBAXIN-750  Take 1 tablet (750 mg total) by mouth every 8 (eight) hours as needed for muscle spasms.     metoprolol succinate 100 MG 24 hr tablet  Commonly known as:  TOPROL-XL  Take 1 tablet (100 mg total) by mouth daily.     nitroGLYCERIN 0.4 MG SL tablet  Commonly known as:  NITROSTAT  Place 1 tablet (0.4 mg total) under the tongue every 5 (five) minutes as needed.     potassium gluconate 595 MG Tabs tablet  Take 595 mg by mouth daily.     PROBIOTIC DAILY PO  Take 1 capsule by mouth daily.     triamterene-hydrochlorothiazide 37.5-25 MG per capsule  Commonly known as:  DYAZIDE  Take 1 each  (1 capsule total) by mouth every morning.     venlafaxine XR 75 MG 24 hr capsule  Commonly known as:  EFFEXOR-XR  Take 75 mg by mouth daily with breakfast.       Allergies  Allergen Reactions  . Ciprofloxacin Hcl Other (See Comments)    Causes pain in tendons, feels like muscle spasms.       Follow-up Information  Follow up with GRAVES,JOHN L, MD. Schedule an appointment as soon as possible for a visit in 2 weeks.   Specialty:  Orthopedic Surgery   Contact information:   [place] [address] [phone]       Follow up with PANG,RICHARD, MD. Schedule an appointment as soon as possible for a visit in 2 weeks.   Specialty:  Internal Medicine   Contact information:   [address], [place] [address] [phone]        The results of significant diagnostics from this hospitalization (including imaging, microbiology, ancillary and laboratory) are listed below for reference.    Significant Diagnostic Studies: Dg Chest 1 View  02/28/2013   CLINICAL DATA:  Right hip fracture.  EXAM: CHEST - 1 VIEW  COMPARISON:  06/02/2011  FINDINGS: Mild cardiomegaly. There is been previous median sternotomy. Elevated left diaphragm which is chronic, often from previous cardiac surgery. Chronic fracture of upper sternal wires. No infiltrate or edema. No effusion or pneumothorax. No acute osseous findings.  IMPRESSION: 1. No active disease. 2. Prior CABG. 3. Chronic left diaphragmatic elevation.   Electronically Signed   By: Jorje Guild M.D.   On: 02/28/2013 21:10   Dg Hip Complete Right  02/28/2013   CLINICAL DATA:  Fall with right hip pain  EXAM: RIGHT HIP - COMPLETE 2+ VIEW  COMPARISON:  None.  FINDINGS: Right femoral neck fracture with varus angulation. The fracture is nearest to a transcervical type. The femoral head is located. Pelvic ring is intact. No significant degenerative changes to the acetabulum.  IMPRESSION: Acute right femoral neck fracture.    Electronically Signed   By: Jorje Guild M.D.   On: 02/28/2013 21:09   Dg Pelvis Portable  03/01/2013   CLINICAL DATA:  Postop right hip.  EXAM: PORTABLE PELVIS 1-2 VIEWS  COMPARISON:  None.  FINDINGS: Patient has undergone right bipolar hip arthroplasty. No evidence for acute fracture or dislocation on this frontal view. Regional bowel gas pattern is nonobstructive. Marland Kitchen  IMPRESSION: Status post right hip arthroplasty.  No adverse features identified.   Electronically Signed   By: Shon Hale M.D.   On: 03/01/2013 17:42   Dg Hip Portable 1 View Right  03/01/2013   CLINICAL DATA:  Postop right hip.  EXAM: PORTABLE RIGHT HIP - 1 VIEW  COMPARISON:  02/28/2013  FINDINGS: Patient has undergone right hip arthroplasty. There is no evidence for dislocation or acute fracture. Postoperative gas overlies the soft tissues of the hip.  IMPRESSION: Status post right hip arthroplasty.  No adverse features.   Electronically Signed   By: Shon Hale M.D.   On: 03/01/2013 17:47    Microbiology: No results found for this or any previous visit (from the past 240 hour(s)).   Labs: Basic Metabolic Panel:  Recent Labs Lab 02/28/13 2025 03/01/13 0514 03/02/13 0508  NA 132* 133* 134*  K 3.1* 3.1* 4.2  CL 89* 92* 94*  CO2 28 29 24   GLUCOSE 126* 123* 156*  BUN 10 8 13   CREATININE 1.02 0.91 1.01  CALCIUM 8.6 8.2* 8.0*   Liver Function Tests:  Recent Labs Lab 02/28/13 2025 03/01/13 0514 03/02/13 0508  AST 242* 101* 73*  ALT 128* 104* 81*  ALKPHOS 215* 210* 178*  BILITOT 0.4 0.5 0.4  PROT 7.3 6.8 5.9*  ALBUMIN 3.7 3.5 2.8*   No results found for this basename: LIPASE, AMYLASE,  in the last 168 hours No results found for this basename: AMMONIA,  in the last 168 hours CBC:  Recent Labs Lab 02/28/13 2025 03/02/13 0508 03/03/13 0444  WBC 10.1 14.4* 12.7*  HGB 12.9* 10.3* 9.6*  HCT 39.7 32.1* 30.6*  MCV 82.7 83.4 84.3  PLT 259 210 236   Cardiac Enzymes:  Recent Labs Lab 03/01/13 0514  03/02/13 0925  TROPONINI <0.30 <0.30   BNP: BNP (last 3 results)  Recent Labs  03/01/13 0514  PROBNP 163.7*   CBG:  Recent Labs Lab 03/02/13 1132 03/02/13 1715 03/02/13 2156 03/03/13 0734 03/03/13 1158  GLUCAP 128* 126* 181* 126* 119*       Signed:  Leniyah Martell  [facility] 03/03/2013, 12:03 PM

## 2013-03-03 NOTE — Progress Notes (Signed)
Physical Therapy Treatment Patient Details Name: [name] [name] MRN: [id number] DOB: January 21, 1946 Today's Date: 03/03/2013 Time: 6060-0459 PT Time Calculation (min): 25 min  PT Assessment / Plan / Recommendation  History of Present Illness Femoral neck fracture displaced, right. s/p right hip hemiarthroplasty   PT Comments   Pt progressing;   Follow Up Recommendations  Home health PT;Supervision for mobility/OOB     Does the patient have the potential to tolerate intense rehabilitation     Barriers to Discharge        Equipment Recommendations  None recommended by PT;Rolling walker with 5" wheels (RW if pt desires; has standard walker)    Recommendations for Other Services    Frequency Min 6X/week   Progress towards PT Goals Progress towards PT goals: Progressing toward goals  Plan Current plan remains appropriate    Precautions / Restrictions Precautions Precautions: Posterior Hip;Fall Precaution Comments: sign in room; reviewed with pt Restrictions Weight Bearing Restrictions: No RLE Weight Bearing: Weight bearing as tolerated   Pertinent Vitals/Pain C/o pain;  Pt was premedicated    Mobility  Bed Mobility Overal bed mobility: Needs Assistance Bed Mobility: Supine to Sit;Sit to Supine Supine to sit: Min assist Sit to supine: Min assist General bed mobility comments: incr time and cues to self assist Transfers Overall transfer level: Needs assistance Equipment used: Rolling walker (2 wheeled) Transfers: Sit to/from Stand Sit to Stand: Min guard;From elevated surface General transfer comment: cues for hand placement and hip precautions with  Ambulation/Gait Ambulation/Gait assistance: Min guard;Supervision Ambulation Distance (Feet): 160 Feet Assistive device: Rolling walker (2 wheeled) Gait Pattern/deviations: Step-to pattern General Gait Details: cues for sequence and RW distance from self Stairs: Yes Stairs assistance: Min assist Stair Management: No  rails;Backwards;With walker Number of Stairs: 2 General stair comments: cues for technique; handout given    Exercises Total Joint Exercises Heel Slides: AAROM;Right;10 reps Hip ABduction/ADduction: AAROM;AROM;Right;10 reps   PT Diagnosis:    PT Problem List:   PT Treatment Interventions:     PT Goals (current goals can now be found in the care plan section) Acute Rehab PT Goals Patient Stated Goal: to get better fast Time For Goal Achievement: 03/09/13 Potential to Achieve Goals: Good  Visit Information  Last PT Received On: 03/03/13 Assistance Needed: +1 History of Present Illness: Femoral neck fracture displaced, right. s/p right hip hemiarthroplasty    Subjective Data  Patient Stated Goal: to get better fast   Cognition  Cognition Arousal/Alertness: Awake/alert Behavior During Therapy: WFL for tasks assessed/performed Overall Cognitive Status: Within Functional Limits for tasks assessed    Balance     End of Session PT - End of Session Equipment Utilized During Treatment: Gait belt Activity Tolerance: Patient tolerated treatment well Patient left: in bed;with call bell/phone within reach Nurse Communication: Mobility status   GP     [facility] 03/03/2013, 11:32 AM

## 2013-03-03 NOTE — Progress Notes (Signed)
Subjective: 2 Days Post-Op Procedure(s) (LRB): ARTHROPLASTY BIPOLAR HIP (Right) Patient reports pain as 5 on 0-10 scale.  "I am OK to go home with HHPT" Taking po/voiding ok.   Objective: Vital signs in last 24 hours: Temp:  [98.1 F (36.7 C)-98.6 F (37 C)] 98.6 F (37 C) (01/16 0547) Pulse Rate:  [66-93] 66 (01/16 0547) Resp:  [16-17] 17 (01/16 0547) BP: (133-177)/(66-97) 145/77 mmHg (01/16 0547) SpO2:  [94 %-100 %] 94 % (01/16 0547)  Intake/Output from previous day: 01/15 0701 - 01/16 0700 In: 480 [P.O.:480] Out: 900 [Urine:900] Intake/Output this shift:     Recent Labs  02/28/13 2025 03/02/13 0508 03/03/13 0444  HGB 12.9* 10.3* 9.6*    Recent Labs  03/02/13 0508 03/03/13 0444  WBC 14.4* 12.7*  RBC 3.85* 3.63*  HCT 32.1* 30.6*  PLT 210 236    Recent Labs  03/01/13 0514 03/02/13 0508  NA 133* 134*  K 3.1* 4.2  CL 92* 94*  CO2 29 24  BUN 8 13  CREATININE 0.91 1.01  GLUCOSE 123* 156*  CALCIUM 8.2* 8.0*    Recent Labs  02/28/13 2025  INR 0.89  Right hip exam:  Neurovascular intact Sensation intact distally Intact pulses distally Dorsiflexion/Plantar flexion intact Incision: dressing C/D/I No cellulitis present Compartment soft  Assessment/Plan: 2 Days Post-Op Procedure(s) (LRB): ARTHROPLASTY BIPOLAR HIP (Right) Plan: I spoke to his wife and she is an Therapist, sports and would like to take him home today. Up with therapy Discharge home with home health later today after PT ASA 325mg  BID x 1 month Will write rx for Dilaudid 2mg  prn pain. Will need HHPT/OT F/U Dr Berenice Primas in 2 weeks.  [place] G 03/03/2013, 7:53 AM

## 2013-03-03 NOTE — Discharge Instructions (Signed)
Ambulate weight bearing as tolerated on the right leg using a walker. Posterior hip precautions as taught in physical therapy. Keep your hip wound dry until after the staples are removed 2 weeks post op. The hip dressing can be changed every couple of days as needed     Fatty Liver Fatty liver is the accumulation of fat in liver cells. It is also called hepatosteatosis or steatohepatitis. It is normal for your liver to contain some fat. If fat is more than 5 to 10% of your liver's weight, you have fatty liver.  There are often no symptoms (problems) for years while damage is still occurring. People often learn about their fatty liver when they have medical tests for other reasons. Fat can damage your liver for years or even decades without causing problems. When it becomes severe, it can cause fatigue, weight loss, weakness, and confusion. This makes you more likely to develop more serious liver problems. The liver is the largest organ in the body. It does a lot of work and often gives no warning signs when it is sick until late in a disease. The liver has many important jobs including:  Breaking down foods.  Storing vitamins, iron, and other minerals.  Making proteins.  Making bile for food digestion.  Breaking down many products including medications, alcohol and some poisons. CAUSES  There are a number of different conditions, medications, and poisons that can cause a fatty liver. Eating too many calories causes fat to build up in the liver. Not processing and breaking fats down normally may also cause this. Certain conditions, such as obesity, diabetes, and high triglycerides also cause this. Most fatty liver patients tend to be middle-aged and over weight.  Some causes of fatty liver are:  Alcohol over consumption.  Malnutrition.  Steroid use.  Valproic acid toxicity.  Obesity.  Cushing's syndrome.  Poisons.  Tetracycline in high  dosages.  Pregnancy.  Diabetes.  Hyperlipidemia.  Rapid weight loss. Some people develop fatty liver even having none of these conditions. SYMPTOMS  Fatty liver most often causes no problems. This is called asymptomatic.  It can be diagnosed with blood tests and also by a liver biopsy.  It is one of the most common causes of minor elevations of liver enzymes on routine blood tests.  Specialized Imaging of the liver using ultrasound, CT (computed tomography) scan, or MRI (magnetic resonance imaging) can suggest a fatty liver but a biopsy is needed to confirm it.  A biopsy involves taking a small sample of liver tissue. This is done by using a needle. It is then looked at under a microscope by a specialist. TREATMENT  It is important to treat the cause. Simple fatty liver without a medical reason may not need treatment.  Weight loss, fat restriction, and exercise in overweight patients produces inconsistent results but is worth trying.  Fatty liver due to alcohol toxicity may not improve even with stopping drinking.  Good control of diabetes may reduce fatty liver.  Lower your triglycerides through diet, medication or both.  Eat a balanced, healthy diet.  Increase your physical activity.  Get regular checkups from a liver specialist.  There are no medical or surgical treatments for a fatty liver or NASH, but improving your diet and increasing your exercise may help prevent or reverse some of the damage. PROGNOSIS  Fatty liver may cause no damage or it can lead to an inflammation of the liver. This is, called steatohepatitis. When it is linked to alcohol  abuse, it is called alcoholic steatohepatitis. It often is not linked to alcohol. It is then called nonalcoholic steatohepatitis, or NASH. Over time the liver may become scarred and hardened. This condition is called cirrhosis. Cirrhosis is serious and may lead to liver failure or cancer. NASH is one of the leading causes of  cirrhosis. About 10-20% of Americans have fatty liver and a smaller 2-5% has NASH. Document Released: 03/20/2005 Document Revised: 04/27/2011 Document Reviewed: 05/13/2005 [facility] Patient Information 2014 [place].

## 2013-03-03 NOTE — Progress Notes (Signed)
Pt to d/c home with [facility] home health. No DME needs. AVS reviewed and "My Chart" discussed with pt. Pt capable of verbalizing medications, dressing changes, signs and symptoms of infection, and follow-up appointments. Remains hemodynamically stable. No signs and symptoms of distress. Educated pt to return to ER in the case of SOB, dizziness, or chest pain.  

## 2013-03-03 NOTE — Progress Notes (Signed)
Occupational Therapy Treatment Patient Details Name: [name] [name] MRN: [id number] DOB: 10-03-1945 Today's Date: 03/03/2013 Time: 6378-5885 OT Time Calculation (min): 26 min  OT Assessment / Plan / Recommendation  History of present illness Femoral neck fracture displaced, right. s/p right hip hemiarthroplasty   OT comments  Making good progress.  Needs reinforcement with thps during functional tasks  Follow Up Recommendations  Home health OT    Barriers to Discharge       Equipment Recommendations   (pt has commode riser and shower seat)    Recommendations for Other Services    Frequency Min 2X/week   Progress towards OT Goals Progress towards OT goals: Progressing toward goals  Plan Discharge plan needs to be updated    Precautions / Restrictions Precautions Precautions: Posterior Hip;Fall Restrictions Weight Bearing Restrictions: No   Pertinent Vitals/Pain 4/10 R hip; repositioned. Pt premedicated and declines ice    ADL  Grooming: Supervision/safety Where Assessed - Grooming: Supported standing Lower Body Dressing: Minimal assistance (with AE) Where Assessed - Lower Body Dressing: Supported sit to Lobbyist: Magazine features editor Method: Sit to Loss adjuster, chartered: Raised toilet seat with arms (or 3-in-1 over toilet) Toileting - Clothing Manipulation and Hygiene: Min guard Where Assessed - Best boy and Hygiene: Sit to stand from 3-in-1 or toilet Tub/Shower Transfer: Min guard Tub/Shower Transfer Method: Therapist, art: Walk in Engineer, site Used: Reacher;Rolling walker;Sock aid Transfers/Ambulation Related to ADLs: cues for technique and safety with transfers and RW position ADL Comments: Practiced with AE.  Pt has AE kit.  Pt needs reinforcement with THPS during functional activities    OT Diagnosis:    OT Problem List:   OT Treatment Interventions:     OT Goals(current goals can  now be found in the care plan section)    Visit Information  Last OT Received On: 03/03/13 Assistance Needed: +1 History of Present Illness: Femoral neck fracture displaced, right. s/p right hip hemiarthroplasty    Subjective Data      Prior Functioning       Cognition  Cognition Arousal/Alertness: Awake/alert Behavior During Therapy: WFL for tasks assessed/performed Overall Cognitive Status: Within Functional Limits for tasks assessed    Mobility       Exercises      Balance    End of Session OT - End of Session Activity Tolerance: Patient tolerated treatment well Patient left: in chair;with call bell/phone within reach  [place] 03/03/2013, 8:58 AM Lesle Chris, OTR/L [phone] 03/03/2013

## 2013-04-25 ENCOUNTER — Encounter (INDEPENDENT_AMBULATORY_CARE_PROVIDER_SITE_OTHER): Payer: Self-pay

## 2013-11-29 ENCOUNTER — Other Ambulatory Visit: Payer: Self-pay | Admitting: Neurosurgery

## 2013-11-29 DIAGNOSIS — M47816 Spondylosis without myelopathy or radiculopathy, lumbar region: Secondary | ICD-10-CM

## 2013-12-04 ENCOUNTER — Emergency Department (HOSPITAL_COMMUNITY): Payer: No Typology Code available for payment source

## 2013-12-04 ENCOUNTER — Encounter (HOSPITAL_COMMUNITY): Payer: Self-pay | Admitting: Emergency Medicine

## 2013-12-04 ENCOUNTER — Emergency Department (HOSPITAL_COMMUNITY)
Admission: EM | Admit: 2013-12-04 | Discharge: 2013-12-04 | Disposition: A | Discharge status: Home / Self Care | Payer: No Typology Code available for payment source | Attending: Emergency Medicine | Admitting: Emergency Medicine

## 2013-12-04 DIAGNOSIS — Z7982 Long term (current) use of aspirin: Secondary | ICD-10-CM | POA: Diagnosis not present

## 2013-12-04 DIAGNOSIS — S299XXA Unspecified injury of thorax, initial encounter: Secondary | ICD-10-CM | POA: Diagnosis not present

## 2013-12-04 DIAGNOSIS — S60812A Abrasion of left wrist, initial encounter: Secondary | ICD-10-CM | POA: Diagnosis not present

## 2013-12-04 DIAGNOSIS — S60811A Abrasion of right wrist, initial encounter: Secondary | ICD-10-CM | POA: Insufficient documentation

## 2013-12-04 DIAGNOSIS — Z79899 Other long term (current) drug therapy: Secondary | ICD-10-CM | POA: Insufficient documentation

## 2013-12-04 DIAGNOSIS — Q6102 Congenital multiple renal cysts: Secondary | ICD-10-CM | POA: Insufficient documentation

## 2013-12-04 DIAGNOSIS — I6782 Cerebral ischemia: Secondary | ICD-10-CM | POA: Insufficient documentation

## 2013-12-04 DIAGNOSIS — Y92410 Unspecified street and highway as the place of occurrence of the external cause: Secondary | ICD-10-CM | POA: Insufficient documentation

## 2013-12-04 DIAGNOSIS — Y9389 Activity, other specified: Secondary | ICD-10-CM | POA: Diagnosis not present

## 2013-12-04 LAB — ETHANOL: Alcohol, Ethyl (B): 121 mg/dL — ABNORMAL HIGH (ref 0–11)

## 2013-12-04 LAB — CBC
HCT: 40.8 % (ref 39.0–52.0)
Hemoglobin: 13.1 g/dL (ref 13.0–17.0)
MCH: 27.9 pg (ref 26.0–34.0)
MCHC: 32.1 g/dL (ref 30.0–36.0)
MCV: 86.8 fL (ref 78.0–100.0)
Platelets: 252 10*3/uL (ref 150–400)
RBC: 4.7 MIL/uL (ref 4.22–5.81)
RDW: 15.1 % (ref 11.5–15.5)
WBC: 9.8 10*3/uL (ref 4.0–10.5)

## 2013-12-04 LAB — I-STAT CHEM 8, ED
BUN: 15 mg/dL (ref 6–23)
CHLORIDE: 91 meq/L — AB (ref 96–112)
Calcium, Ion: 1.09 mmol/L — ABNORMAL LOW (ref 1.13–1.30)
Creatinine, Ser: 1.5 mg/dL — ABNORMAL HIGH (ref 0.50–1.35)
Glucose, Bld: 112 mg/dL — ABNORMAL HIGH (ref 70–99)
HCT: 46 % (ref 39.0–52.0)
Hemoglobin: 15.6 g/dL (ref 13.0–17.0)
Potassium: 3.3 mEq/L — ABNORMAL LOW (ref 3.7–5.3)
SODIUM: 130 meq/L — AB (ref 137–147)
TCO2: 27 mmol/L (ref 0–100)

## 2013-12-04 LAB — COMPREHENSIVE METABOLIC PANEL
ALT: 16 U/L (ref 0–53)
AST: 24 U/L (ref 0–37)
Albumin: 4 g/dL (ref 3.5–5.2)
Alkaline Phosphatase: 86 U/L (ref 39–117)
Anion gap: 16 — ABNORMAL HIGH (ref 5–15)
BILIRUBIN TOTAL: 0.2 mg/dL — AB (ref 0.3–1.2)
BUN: 14 mg/dL (ref 6–23)
CALCIUM: 9.2 mg/dL (ref 8.4–10.5)
CHLORIDE: 88 meq/L — AB (ref 96–112)
CO2: 27 meq/L (ref 19–32)
Creatinine, Ser: 1.35 mg/dL (ref 0.50–1.35)
GFR calc Af Amer: 61 mL/min — ABNORMAL LOW (ref >90)
GFR calc non Af Amer: 52 mL/min — ABNORMAL LOW (ref >90)
Glucose, Bld: 108 mg/dL — ABNORMAL HIGH (ref 70–99)
Potassium: 3.4 mEq/L — ABNORMAL LOW (ref 3.7–5.3)
SODIUM: 131 meq/L — AB (ref 137–147)
Total Protein: 7.7 g/dL (ref 6.0–8.3)

## 2013-12-04 LAB — PROTIME-INR
INR: 0.99 (ref 0.00–1.49)
Prothrombin Time: 13.2 seconds (ref 11.6–15.2)

## 2013-12-04 MED ORDER — IOHEXOL 300 MG/ML  SOLN
100.0000 mL | Freq: Once | INTRAMUSCULAR | Status: AC | PRN
  Administered 2013-12-04: 100 mL via INTRAVENOUS

## 2013-12-04 NOTE — ED Notes (Signed)
Assisted patient with urinal. Unable to urinate at this time. Given warm blanket. Pt. Continues to repeat that he needs to urinate but is refusing urinal.

## 2013-12-04 NOTE — Discharge Instructions (Signed)
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
## 2013-12-09 ENCOUNTER — Ambulatory Visit
Admission: RE | Admit: 2013-12-09 | Discharge: 2013-12-09 | Disposition: A | Discharge status: Home / Self Care | Payer: Medicare Other | Source: Ambulatory Visit | Attending: Neurosurgery | Admitting: Neurosurgery

## 2013-12-09 DIAGNOSIS — M47816 Spondylosis without myelopathy or radiculopathy, lumbar region: Secondary | ICD-10-CM

## 2014-01-01 ENCOUNTER — Encounter (HOSPITAL_COMMUNITY): Payer: Self-pay | Admitting: Emergency Medicine

## 2014-01-01 ENCOUNTER — Emergency Department (HOSPITAL_COMMUNITY)
Admission: EM | Admit: 2014-01-01 | Discharge: 2014-01-01 | Disposition: A | Discharge status: Home / Self Care | Payer: Medicare Other | Attending: Emergency Medicine | Admitting: Emergency Medicine

## 2014-01-01 DIAGNOSIS — Z87891 Personal history of nicotine dependence: Secondary | ICD-10-CM | POA: Diagnosis not present

## 2014-01-01 DIAGNOSIS — Z951 Presence of aortocoronary bypass graft: Secondary | ICD-10-CM | POA: Insufficient documentation

## 2014-01-01 DIAGNOSIS — K219 Gastro-esophageal reflux disease without esophagitis: Secondary | ICD-10-CM | POA: Insufficient documentation

## 2014-01-01 DIAGNOSIS — F1010 Alcohol abuse, uncomplicated: Secondary | ICD-10-CM

## 2014-01-01 DIAGNOSIS — F10129 Alcohol abuse with intoxication, unspecified: Secondary | ICD-10-CM | POA: Diagnosis not present

## 2014-01-01 DIAGNOSIS — F1310 Sedative, hypnotic or anxiolytic abuse, uncomplicated: Secondary | ICD-10-CM | POA: Insufficient documentation

## 2014-01-01 DIAGNOSIS — Z8639 Personal history of other endocrine, nutritional and metabolic disease: Secondary | ICD-10-CM | POA: Diagnosis not present

## 2014-01-01 DIAGNOSIS — I10 Essential (primary) hypertension: Secondary | ICD-10-CM | POA: Insufficient documentation

## 2014-01-01 DIAGNOSIS — I252 Old myocardial infarction: Secondary | ICD-10-CM | POA: Diagnosis not present

## 2014-01-01 DIAGNOSIS — M1990 Unspecified osteoarthritis, unspecified site: Secondary | ICD-10-CM | POA: Insufficient documentation

## 2014-01-01 DIAGNOSIS — I2510 Atherosclerotic heart disease of native coronary artery without angina pectoris: Secondary | ICD-10-CM | POA: Diagnosis not present

## 2014-01-01 DIAGNOSIS — M109 Gout, unspecified: Secondary | ICD-10-CM | POA: Diagnosis not present

## 2014-01-01 DIAGNOSIS — F1110 Opioid abuse, uncomplicated: Secondary | ICD-10-CM | POA: Insufficient documentation

## 2014-01-01 DIAGNOSIS — Z79899 Other long term (current) drug therapy: Secondary | ICD-10-CM | POA: Insufficient documentation

## 2014-01-01 DIAGNOSIS — Z7982 Long term (current) use of aspirin: Secondary | ICD-10-CM | POA: Diagnosis not present

## 2014-01-01 LAB — RAPID URINE DRUG SCREEN, HOSP PERFORMED
Amphetamines: NOT DETECTED
BARBITURATES: NOT DETECTED
Benzodiazepines: POSITIVE — AB
COCAINE: NOT DETECTED
Opiates: POSITIVE — AB
TETRAHYDROCANNABINOL: NOT DETECTED

## 2014-01-01 LAB — COMPREHENSIVE METABOLIC PANEL
ALK PHOS: 81 U/L (ref 39–117)
ALT: 15 U/L (ref 0–53)
ANION GAP: 16 — AB (ref 5–15)
AST: 38 U/L — ABNORMAL HIGH (ref 0–37)
Albumin: 3.8 g/dL (ref 3.5–5.2)
BILIRUBIN TOTAL: 0.3 mg/dL (ref 0.3–1.2)
BUN: 19 mg/dL (ref 6–23)
CHLORIDE: 95 meq/L — AB (ref 96–112)
CO2: 24 meq/L (ref 19–32)
Calcium: 9.5 mg/dL (ref 8.4–10.5)
Creatinine, Ser: 1.39 mg/dL — ABNORMAL HIGH (ref 0.50–1.35)
GFR, EST AFRICAN AMERICAN: 59 mL/min — AB (ref >90)
GFR, EST NON AFRICAN AMERICAN: 51 mL/min — AB (ref >90)
GLUCOSE: 118 mg/dL — AB (ref 70–99)
POTASSIUM: 4.3 meq/L (ref 3.7–5.3)
Sodium: 135 mEq/L — ABNORMAL LOW (ref 137–147)
Total Protein: 7.7 g/dL (ref 6.0–8.3)

## 2014-01-01 LAB — ETHANOL: Alcohol, Ethyl (B): <11 mg/dL (ref 0–11)

## 2014-01-01 LAB — CBC
HEMATOCRIT: 38.8 % — AB (ref 39.0–52.0)
Hemoglobin: 12.6 g/dL — ABNORMAL LOW (ref 13.0–17.0)
MCH: 27.8 pg (ref 26.0–34.0)
MCHC: 32.5 g/dL (ref 30.0–36.0)
MCV: 85.7 fL (ref 78.0–100.0)
Platelets: 319 10*3/uL (ref 150–400)
RBC: 4.53 MIL/uL (ref 4.22–5.81)
RDW: 14.8 % (ref 11.5–15.5)
WBC: 9.6 10*3/uL (ref 4.0–10.5)

## 2014-01-01 LAB — SALICYLATE LEVEL: Salicylate Lvl: <2 mg/dL — ABNORMAL LOW (ref 2.8–20.0)

## 2014-01-01 LAB — ACETAMINOPHEN LEVEL

## 2014-01-01 MED ORDER — ZOLPIDEM TARTRATE 5 MG PO TABS: 5.0000 mg | ORAL_TABLET | Freq: Every evening | ORAL | Status: DC | PRN

## 2014-01-01 MED ORDER — ONDANSETRON HCL 4 MG PO TABS: 4.0000 mg | ORAL_TABLET | Freq: Three times a day (TID) | ORAL | Status: DC | PRN

## 2014-01-01 MED ORDER — POTASSIUM GLUCONATE 595 (99 K) MG PO TABS
595.0000 mg | ORAL_TABLET | Freq: Every day | ORAL | Status: DC
  Filled 2014-01-01: qty 1

## 2014-01-01 MED ORDER — GABAPENTIN 300 MG PO CAPS: 300.0000 mg | ORAL_CAPSULE | Freq: Every day | ORAL | Status: DC

## 2014-01-01 MED ORDER — NICOTINE 21 MG/24HR TD PT24: 21.0000 mg | MEDICATED_PATCH | Freq: Every day | TRANSDERMAL | Status: DC

## 2014-01-01 MED ORDER — IBUPROFEN 200 MG PO TABS: 600.0000 mg | ORAL_TABLET | Freq: Three times a day (TID) | ORAL | Status: DC | PRN

## 2014-01-01 MED ORDER — ASPIRIN EC 325 MG PO TBEC
325.0000 mg | DELAYED_RELEASE_TABLET | Freq: Every day | ORAL | Status: DC
  Filled 2014-01-01: qty 1

## 2014-01-01 MED ORDER — IRBESARTAN 75 MG PO TABS: 75.0000 mg | ORAL_TABLET | Freq: Every day | ORAL | Status: DC

## 2014-01-01 MED ORDER — PANTOPRAZOLE SODIUM 40 MG PO TBEC: 40.0000 mg | DELAYED_RELEASE_TABLET | Freq: Every day | ORAL | Status: DC

## 2014-01-01 MED ORDER — ALUM & MAG HYDROXIDE-SIMETH 200-200-20 MG/5ML PO SUSP: 30.0000 mL | ORAL | Status: DC | PRN

## 2014-01-01 MED ORDER — METFORMIN HCL ER 500 MG PO TB24
500.0000 mg | ORAL_TABLET | Freq: Every day | ORAL | Status: DC
  Filled 2014-01-01: qty 1

## 2014-01-01 MED ORDER — ACETAMINOPHEN 325 MG PO TABS: 650.0000 mg | ORAL_TABLET | ORAL | Status: DC | PRN

## 2014-01-01 MED ORDER — MAGNESIUM OXIDE 400 (241.3 MG) MG PO TABS
400.0000 mg | ORAL_TABLET | Freq: Every day | ORAL | Status: DC
  Filled 2014-01-01: qty 1

## 2014-01-01 MED ORDER — VENLAFAXINE HCL ER 150 MG PO CP24
150.0000 mg | ORAL_CAPSULE | Freq: Every evening | ORAL | Status: DC
Start: 2014-01-01 — End: 2014-01-01
  Filled 2014-01-01: qty 1

## 2014-01-01 MED ORDER — CLONAZEPAM 0.5 MG PO TABS: 1.0000 mg | ORAL_TABLET | Freq: Two times a day (BID) | ORAL | Status: DC

## 2014-01-01 MED ORDER — DOCUSATE SODIUM 100 MG PO CAPS: 100.0000 mg | ORAL_CAPSULE | Freq: Two times a day (BID) | ORAL | Status: DC | PRN

## 2014-01-01 MED ORDER — B COMPLEX-C PO TABS
1.0000 | ORAL_TABLET | Freq: Every day | ORAL | Status: DC
  Filled 2014-01-01: qty 1

## 2014-01-01 MED ORDER — CALCIUM CARBONATE-VITAMIN D 500-200 MG-UNIT PO TABS
1.0000 | ORAL_TABLET | Freq: Every day | ORAL | Status: DC
  Filled 2014-01-01: qty 1

## 2014-01-01 MED ORDER — METOPROLOL SUCCINATE ER 100 MG PO TB24: 100.0000 mg | ORAL_TABLET | Freq: Every day | ORAL | Status: DC

## 2014-01-01 MED ORDER — TRIAMTERENE-HCTZ 37.5-25 MG PO TABS
1.0000 | ORAL_TABLET | Freq: Every day | ORAL | Status: DC
  Filled 2014-01-01: qty 1

## 2014-01-01 MED ORDER — VITAMIN D3 25 MCG (1000 UNIT) PO TABS: 2000.0000 [IU] | ORAL_TABLET | Freq: Every day | ORAL | Status: DC

## 2014-01-01 MED ORDER — ALLOPURINOL 300 MG PO TABS
300.0000 mg | ORAL_TABLET | Freq: Every evening | ORAL | Status: DC
  Filled 2014-01-01: qty 1

## 2014-01-01 NOTE — ED Notes (Signed)
Pt ambulatory with cane upon DC. Pt was advised to follow up with the resources that were provided.

## 2014-01-01 NOTE — ED Notes (Signed)
Pt is here for etoh detox, last drink yesterday, normal intake about 6-8 beers a day along with a shot of vodka.

## 2014-01-01 NOTE — ED Notes (Signed)
Patient belongings have been taken out to the car by wife. Patient and belongings have been wanded by security.

## 2014-01-01 NOTE — BHH Counselor (Signed)
Pt declined writer's suggestion that pt sign voluntary consent so pt's attorney could have access to pt's medical records. Writer provided pt and pt's with following resources: [facility]ensive [facility] [place] [address]     [place] [address] [place],  [place], [place]      [phone] Both a day and evening program   *Accepts MCD  [place] Substance abuse treatment ctr [address]     [address] [place], [place] [address] [phone]      [phone]  [facility]: [facility]    [facility] - Intensive Outpatient [place] [address] [place], [place] [address]     [place], [place]      [place] [address], [place]. [place], [place] [address] [phone] *Accepts MCD      Residential Treatment Programs ASAP Residential Treatment    [facility] [address]     [address] [place], [place]      [phone] or [place]     The [address] (Several in [place]) [place] [address]    [place] [address]     [place], [place]      [phone]  [place]   [facility] ([facility]) [place]     [address] [place], [place] [address]     [place], [place]      [phone] Admissions: 8am-3pm M-F  Self-Help/Support Groups [facility] [place] [facility]   Narcotics Anonymous (NA) Variety of support groups    [facility] [phone] (call for more info)    [place] - 2 meetings at this location These referrals have been provided to you as appropriate for your clinical needs while taking into  account your financial concerns. Please be aware that agencies, practitioners and insurance companies sometimes change contracts. When calling to make an appointment have your insurance information available so the professional you are going to see can confirm whether they are covered by your plan. Take this form with you in case the person you are seeing needs a copy or to contact us.  __________________________________________ Assessment Counselor   [place]: [place]:  [phone]  or [phone] [address], [place] [address] [email or web address]   Treatment House [address] [place], [place] [address] [phone] [facility] (Fax)   Mobile Crisis Teams [facility]    Assertive  Mobile Crisis Care Unit    [facility] [phone]     [address], [place], [place]  [place] [address], [place] [phone]   [facility]idential [facility] Outpatient Substance Abuse Program [facility] [place] [address] [place] [phone]   These referrals have been provided to you as appropriate for your clinical needs while taking into account your financial concerns. Please be aware that agencies, practitioners and insurance companies sometimes change contracts. When calling to make an  appointment have your insurance information available so the professional you are going to see can confirm whether they are covered by your plan. Take this form with you in case the person you are seeing needs a copy or to contact us.  Arnold Long, [place] Assessment Counselor
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
## 2014-01-01 NOTE — Discharge Instructions (Signed)
°Alcohol and Nutrition °Nutrition serves two purposes. It provides energy. It also maintains body structure and function. Food supplies energy. It also provides the building blocks needed to replace worn or damaged cells. Alcoholics often eat poorly. This limits their supply of essential nutrients. This affects energy supply and structure maintenance. Alcohol also affects the body's nutrients in: °· Digestion. °· Storage. °· Using and getting rid of waste products. °IMPAIRMENT OF NUTRIENT DIGESTION AND UTILIZATION  °· Once ingested, food must be broken down into small components (digested). Then it is available for energy. It helps maintain body structure and function. Digestion begins in the mouth. It continues in the stomach and intestines, with help from the pancreas. The nutrients from digested food are absorbed from the intestines into the blood. Then they are carried to the liver. The liver prepares nutrients for: °¨ Immediate use. °¨ Storage and future use. °· Alcohol inhibits the breakdown of nutrients into usable molecules. °¨ It decreases secretion of digestive enzymes from the pancreas. °· Alcohol impairs nutrient absorption by damaging the cells lining the stomach and intestines. °· It also interferes with moving some nutrients into the blood. °· In addition, nutritional deficiencies themselves may lead to further absorption problems. °¨ For example, folate deficiency changes the cells that line the small intestine. This impairs how water is absorbed. It also affects absorbed nutrients. These include glucose, sodium, and additional folate. °· Even if nutrients are digested and absorbed, alcohol can prevent them from being fully used. It changes their transport, storage, and excretion. Impaired utilization of nutrients by alcoholics is indicated by: °¨ Decreased liver stores of vitamins, such as vitamin A. °¨ Increased excretion of nutrients such as fat. °ALCOHOL AND ENERGY SUPPLY  °· Three basic  nutritional components found in food are: °¨ Carbohydrates. °¨ Proteins. °¨ Fats. °· These are used as energy. Some alcoholics take in as much as 50% of their total daily calories from alcohol. They often neglect important foods. °· Even when enough food is eaten, alcohol can impair the ways the body controls blood sugar (glucose) levels. It may either increase or decrease blood sugar. °¨ In non-diabetic alcoholics, increased blood sugar (hyperglycemia) is caused by poor insulin secretion. It is usually temporary. °¨ Decreased blood sugar (hypoglycemia) can cause serious injury even if this condition is short-lived. Low blood sugar can happen when a fasting or malnourished person drinks alcohol. When there is no food to supply energy, stored sugar is used up. The products of alcohol inhibit forming glucose from other compounds such as amino acids. As a result, alcohol causes the brain and other body tissue to lack glucose. It is needed for energy and function. °· Alcohol is an energy source. But how the body processes and uses the energy from alcohol is complex. Also, when alcohol is substituted for carbohydrates, subjects tend to lose weight. This indicates that they get less energy from alcohol than from food. °ALCOHOL - MAINTAINING CELL STRUCTURE AND FUNCTION  °Structure °Cells are made mostly of protein. So an adequate protein diet is important for maintaining cell structure. This is especially true if cells are being damaged. Research indicates that alcohol affects protein nutrition by causing impaired: °· Digestion of proteins to amino acids. °· Processing of amino acids by the small intestine and liver. °· Synthesis of proteins from amino acids. °· Protein secretion by the liver. °Function °Nutrients are essential for the body to function well. They provide the tools that the body needs to work well:  °·   Proteins.  Vitamins.  Minerals. Alcohol can disrupt body function. It may cause nutrient  deficiencies. And it may interfere with the way nutrients are processed. Vitamins  Vitamins are essential to maintain growth and normal metabolism. They regulate many of the body`s processes. Chronic heavy drinking causes deficiencies in many vitamins. This is caused by eating less. And, in some cases, vitamins may be poorly absorbed. For example, alcohol inhibits fat absorption. It impairs how the vitamins A, E, and D are normally absorbed along with dietary fats. Not enough vitamin A may cause night blindness. Not enough vitamin D may cause softening of the bones.  Some alcoholics lack vitamins A, C, D, E, K, and the B vitamins. These are all involved in wound healing and cell maintenance. In particular, because vitamin K is necessary for blood clotting, lacking that vitamin can cause delayed clotting. The result is excess bleeding. Lacking other vitamins involved in brain function may cause severe neurological damage. Minerals Deficiencies of minerals such as calcium, magnesium, iron, and zinc are common in alcoholics. The alcohol itself does not seem to affect how these minerals are absorbed. Rather, they seem to occur secondary to other alcohol-related problems, such as:  Less calcium absorbed.  Not enough magnesium.  More urinary excretion.  Vomiting.  Diarrhea.  Not enough iron due to gastrointestinal bleeding.  Not enough zinc or losses related to other nutrient deficiencies.  Mineral deficiencies can cause a variety of medical consequences. These range from calcium-related bone disease to zinc-related night blindness and skin lesions. ALCOHOL, MALNUTRITION, AND MEDICAL COMPLICATIONS  Liver Disease   Alcoholic liver damage is caused primarily by alcohol itself. But poor nutrition may increase the risk of alcohol-related liver damage. For example, nutrients normally found in the liver are known to be affected by drinking alcohol. These include carotenoids, which are the major  sources of vitamin A, and vitamin E compounds. Decreases in such nutrients may play some role in alcohol-related liver damage. Pancreatitis  Research suggests that malnutrition may increase the risk of developing alcoholic pancreatitis. Research suggests that a diet lacking in protein may increase alcohol's damaging effect on the pancreas. Brain  Nutritional deficiencies may have severe effects on brain function. These may be permanent. Specifically, thiamine deficiencies are often seen in alcoholics. They can cause severe neurological problems. These include:  Impaired movement.  Memory loss seen in Wernicke-Korsakoff syndrome. Pregnancy  Alcohol has toxic effects on fetal development. It causes alcohol-related birth defects. They include fetal alcohol syndrome. Alcohol itself is toxic to the fetus. Also, the nutritional deficiency can affect how the fetus develops. That may compound the risk of developmental damage.  Nutritional needs during pregnancy are 10% to 30% greater than normal. Food intake can increase by as much as 140% to cover the needs of both mother and fetus. An alcoholic mother`s nutritional problems may adversely affect the nutrition of the fetus. And alcohol itself can also restrict nutrition flow to the fetus. NUTRITIONAL STATUS OF ALCOHOLICS  Techniques for assessing nutritional status include:  Taking body measurements to estimate fat reserves. They include:  Weight.  Height.  Mass.  Skin fold thickness.  Performing blood analysis to provide measurements of circulating:  Proteins.  Vitamins.  Minerals.  These techniques tend to be imprecise. For many nutrients, there is no clear "cut-off" point that would allow an accurate definition of deficiency. So assessing the nutritional status of alcoholics is limited by these techniques. Dietary status may provide information about the risk of developing nutritional problems.  Dietary status is assessed by:  Taking  patients' dietary histories.  Evaluating the amount and types of food they are eating.  It is difficult to determine what exact amount of alcohol begins to have damaging effects on nutrition. In general, moderate drinkers have 2 drinks or less per day. They seem to be at little risk for nutritional problems. Various medical disorders begin to appear at greater levels.  Research indicates that the majority of even the heaviest drinkers have few obvious nutritional deficiencies. Many alcoholics who are hospitalized for medical complications of their disease do have severe malnutrition. Alcoholics tend to eat poorly. Often they eat less than the amounts of food necessary to provide enough:  Carbohydrates.  Protein.  Fat.  Vitamins A and C.  B vitamins.  Minerals like calcium and iron. Of major concern is alcohol's effect on digesting food and use of nutrients. It may shift a mildly malnourished person toward severe malnutrition. Document Released: 11/27/2004 Document Revised: 04/27/2011 Document Reviewed: 05/13/2005 [facility] Patient Information 2015 [place], [place]. This information is not intended to replace advice given to you by your health care provider. Make sure you discuss any questions you have with your health care provider.  Alcohol Withdrawal Anytime drug use is interfering with normal living activities it has become abuse. This includes problems with family and friends. Psychological dependence has developed when your mind tells you that the drug is needed. This is usually followed by physical dependence when a continuing increase of drugs are required to get the same feeling or "high." This is known as addiction or chemical dependency. A person's risk is much higher if there is a history of chemical dependency in the family. Mild Withdrawal Following Stopping Alcohol, When Addiction or Chemical Dependency Has Developed When a person has developed tolerance to alcohol, any sudden  stopping of alcohol can cause uncomfortable physical symptoms. Most of the time these are mild and consist of tremors in the hands and increases in heart rate, breathing, and temperature. Sometimes these symptoms are associated with anxiety, panic attacks, and bad dreams. There may also be stomach upset. Normal sleep patterns are often interrupted with periods of inability to sleep (insomnia). This may last for 6 months. Because of this discomfort, many people choose to continue drinking to get rid of this discomfort and to try to feel normal. Severe Withdrawal with Decreased or No Alcohol Intake, When Addiction or Chemical Dependency Has Developed About five percent of alcoholics will develop signs of severe withdrawal when they stop using alcohol. One sign of this is development of generalized seizures (convulsions). Other signs of this are severe agitation and confusion. This may be associated with believing in things which are not real or seeing things which are not really there (delusions and hallucinations). Vitamin deficiencies are usually present if alcohol intake has been long-term. Treatment for this most often requires hospitalization and close observation. Addiction can only be helped by stopping use of all chemicals. This is hard but may save your life. With continual alcohol use, possible outcomes are usually loss of self respect and esteem, violence, and death. Addiction cannot be cured but it can be stopped. This often requires outside help and the care of professionals. Treatment centers are listed in the yellow pages under Cocaine, Narcotics, and Alcoholics Anonymous. Most hospitals and clinics can refer you to a specialized care center. It is not necessary for you to go through the uncomfortable symptoms of withdrawal. Your caregiver can provide you with medicines that will  help you through this difficult period. Try to avoid situations, friends, or drugs that made it possible for you to keep  using alcohol in the past. Learn how to say no. °It takes a long period of time to overcome addictions to all drugs, including alcohol. There may be many times when you feel as though you want a drink. After getting rid of the physical addiction and withdrawal, you will have a lessening of the craving which tells you that you need alcohol to feel normal. Call your caregiver if more support is needed. Learn who to talk to in your family and among your friends so that during these periods you can receive outside help. Alcoholics Anonymous (AA) has helped many people over the years. To get further help, contact AA or call your caregiver, counselor, or clergyperson. Al-Anon and Alateen are support groups for friends and family members of an alcoholic. The people who love and care for an alcoholic often need help, too. For information about these organizations, check your phone directory or call a local alcoholism treatment center.  °SEEK IMMEDIATE MEDICAL CARE IF:  °· You have a seizure. °· You have a fever. °· You experience uncontrolled vomiting or you vomit up blood. This may be bright red or look like black coffee grounds. °· You have blood in the stool. This may be bright red or appear as a black, tarry, bad-smelling stool. °· You become lightheaded or faint. Do not drive if you feel this way. Have someone else drive you or call 911 for help. °· You become more agitated or confused. °· You develop uncontrolled anxiety. °· You begin to see things that are not really there (hallucinate). °Your caregiver has determined that you completely understand your medical condition, and that your mental state is back to normal. You understand that you have been treated for alcohol withdrawal, have agreed not to drink any alcohol for a minimum of 1 day, will not operate a car or other machinery for 24 hours, and have had an opportunity to ask any questions about your condition. °Document Released: 11/12/2004 Document Revised:  04/27/2011 Document Reviewed: 09/21/2007 °[employer]® Patient Information ©2015 [employer]. This information is not intended to replace advice given to you by your health care provider. Make sure you discuss any questions you have with your health care provider. ° ° °

## 2014-08-13 ENCOUNTER — Other Ambulatory Visit: Payer: Self-pay

## 2014-08-24 ENCOUNTER — Ambulatory Visit (HOSPITAL_COMMUNITY)
Admission: EM | Admit: 2014-08-24 | Payer: No Typology Code available for payment source | Source: Other Acute Inpatient Hospital | Admitting: Cardiovascular Disease

## 2014-08-24 ENCOUNTER — Encounter (HOSPITAL_COMMUNITY)
Admission: EM | Disposition: A | Discharge status: Home / Self Care | Payer: Self-pay | Source: Home / Self Care | Attending: Internal Medicine

## 2014-08-24 ENCOUNTER — Inpatient Hospital Stay (HOSPITAL_COMMUNITY)
Admission: EM | Admit: 2014-08-24 | Discharge: 2014-08-27 | DRG: 871 | Disposition: A | Discharge status: Home / Self Care | Payer: Medicare Other | Attending: Internal Medicine | Admitting: Internal Medicine

## 2014-08-24 ENCOUNTER — Encounter (HOSPITAL_COMMUNITY): Payer: Self-pay | Admitting: *Deleted

## 2014-08-24 ENCOUNTER — Emergency Department (HOSPITAL_COMMUNITY): Payer: Medicare Other

## 2014-08-24 DIAGNOSIS — I10 Essential (primary) hypertension: Secondary | ICD-10-CM | POA: Diagnosis present

## 2014-08-24 DIAGNOSIS — R778 Other specified abnormalities of plasma proteins: Secondary | ICD-10-CM | POA: Insufficient documentation

## 2014-08-24 DIAGNOSIS — Z881 Allergy status to other antibiotic agents status: Secondary | ICD-10-CM | POA: Diagnosis not present

## 2014-08-24 DIAGNOSIS — I2510 Atherosclerotic heart disease of native coronary artery without angina pectoris: Secondary | ICD-10-CM | POA: Diagnosis present

## 2014-08-24 DIAGNOSIS — I129 Hypertensive chronic kidney disease with stage 1 through stage 4 chronic kidney disease, or unspecified chronic kidney disease: Secondary | ICD-10-CM | POA: Diagnosis present

## 2014-08-24 DIAGNOSIS — Z96641 Presence of right artificial hip joint: Secondary | ICD-10-CM | POA: Diagnosis present

## 2014-08-24 DIAGNOSIS — N189 Chronic kidney disease, unspecified: Secondary | ICD-10-CM | POA: Diagnosis not present

## 2014-08-24 DIAGNOSIS — M109 Gout, unspecified: Secondary | ICD-10-CM | POA: Diagnosis present

## 2014-08-24 DIAGNOSIS — I252 Old myocardial infarction: Secondary | ICD-10-CM

## 2014-08-24 DIAGNOSIS — N183 Chronic kidney disease, stage 3 (moderate): Secondary | ICD-10-CM | POA: Diagnosis present

## 2014-08-24 DIAGNOSIS — F1020 Alcohol dependence, uncomplicated: Secondary | ICD-10-CM | POA: Diagnosis present

## 2014-08-24 DIAGNOSIS — R7989 Other specified abnormal findings of blood chemistry: Secondary | ICD-10-CM | POA: Diagnosis not present

## 2014-08-24 DIAGNOSIS — IMO0001 Reserved for inherently not codable concepts without codable children: Secondary | ICD-10-CM | POA: Insufficient documentation

## 2014-08-24 DIAGNOSIS — A419 Sepsis, unspecified organism: Secondary | ICD-10-CM | POA: Diagnosis not present

## 2014-08-24 DIAGNOSIS — E785 Hyperlipidemia, unspecified: Secondary | ICD-10-CM | POA: Diagnosis present

## 2014-08-24 DIAGNOSIS — E876 Hypokalemia: Secondary | ICD-10-CM | POA: Diagnosis present

## 2014-08-24 DIAGNOSIS — I248 Other forms of acute ischemic heart disease: Secondary | ICD-10-CM | POA: Diagnosis present

## 2014-08-24 DIAGNOSIS — I472 Ventricular tachycardia: Secondary | ICD-10-CM | POA: Diagnosis not present

## 2014-08-24 DIAGNOSIS — G9340 Encephalopathy, unspecified: Secondary | ICD-10-CM

## 2014-08-24 DIAGNOSIS — F1010 Alcohol abuse, uncomplicated: Secondary | ICD-10-CM | POA: Diagnosis not present

## 2014-08-24 DIAGNOSIS — E869 Volume depletion, unspecified: Secondary | ICD-10-CM | POA: Diagnosis present

## 2014-08-24 DIAGNOSIS — K603 Anal fistula, unspecified: Secondary | ICD-10-CM | POA: Diagnosis present

## 2014-08-24 DIAGNOSIS — Z96651 Presence of right artificial knee joint: Secondary | ICD-10-CM | POA: Diagnosis present

## 2014-08-24 DIAGNOSIS — Z87891 Personal history of nicotine dependence: Secondary | ICD-10-CM | POA: Diagnosis not present

## 2014-08-24 DIAGNOSIS — Z951 Presence of aortocoronary bypass graft: Secondary | ICD-10-CM | POA: Diagnosis not present

## 2014-08-24 DIAGNOSIS — Z9049 Acquired absence of other specified parts of digestive tract: Secondary | ICD-10-CM | POA: Diagnosis present

## 2014-08-24 DIAGNOSIS — J189 Pneumonia, unspecified organism: Secondary | ICD-10-CM | POA: Diagnosis present

## 2014-08-24 DIAGNOSIS — E1122 Type 2 diabetes mellitus with diabetic chronic kidney disease: Secondary | ICD-10-CM | POA: Diagnosis present

## 2014-08-24 DIAGNOSIS — E119 Type 2 diabetes mellitus without complications: Secondary | ICD-10-CM

## 2014-08-24 DIAGNOSIS — N179 Acute kidney failure, unspecified: Secondary | ICD-10-CM | POA: Diagnosis not present

## 2014-08-24 DIAGNOSIS — K219 Gastro-esophageal reflux disease without esophagitis: Secondary | ICD-10-CM | POA: Diagnosis present

## 2014-08-24 DIAGNOSIS — R079 Chest pain, unspecified: Secondary | ICD-10-CM | POA: Diagnosis not present

## 2014-08-24 DIAGNOSIS — R0602 Shortness of breath: Secondary | ICD-10-CM | POA: Diagnosis not present

## 2014-08-24 LAB — I-STAT TROPONIN, ED: Troponin i, poc: 0.03 ng/mL (ref 0.00–0.08)

## 2014-08-24 LAB — URINALYSIS, ROUTINE W REFLEX MICROSCOPIC
Bilirubin Urine: NEGATIVE
Glucose, UA: NEGATIVE mg/dL
HGB URINE DIPSTICK: NEGATIVE
Ketones, ur: NEGATIVE mg/dL
Leukocytes, UA: NEGATIVE
Nitrite: NEGATIVE
PH: 6 (ref 5.0–8.0)
PROTEIN: 100 mg/dL — AB
SPECIFIC GRAVITY, URINE: 1.011 (ref 1.005–1.030)
Urobilinogen, UA: 0.2 mg/dL (ref 0.0–1.0)

## 2014-08-24 LAB — EXPECTORATED SPUTUM ASSESSMENT W GRAM STAIN, RFLX TO RESP C

## 2014-08-24 LAB — EXPECTORATED SPUTUM ASSESSMENT W REFEX TO RESP CULTURE

## 2014-08-24 LAB — URINE MICROSCOPIC-ADD ON

## 2014-08-24 LAB — PROCALCITONIN: Procalcitonin: 0.46 ng/mL

## 2014-08-24 LAB — CBC WITH DIFFERENTIAL/PLATELET
BASOS ABS: 0 10*3/uL (ref 0.0–0.1)
BASOS PCT: 0 % (ref 0–1)
EOS PCT: 1 % (ref 0–5)
Eosinophils Absolute: 0.2 10*3/uL (ref 0.0–0.7)
HCT: 42.1 % (ref 39.0–52.0)
Hemoglobin: 13.6 g/dL (ref 13.0–17.0)
LYMPHS ABS: 2 10*3/uL (ref 0.7–4.0)
Lymphocytes Relative: 12 % (ref 12–46)
MCH: 26.4 pg (ref 26.0–34.0)
MCHC: 32.3 g/dL (ref 30.0–36.0)
MCV: 81.7 fL (ref 78.0–100.0)
MONOS PCT: 5 % (ref 3–12)
Monocytes Absolute: 1 10*3/uL (ref 0.1–1.0)
Neutro Abs: 14.4 10*3/uL — ABNORMAL HIGH (ref 1.7–7.7)
Neutrophils Relative %: 82 % — ABNORMAL HIGH (ref 43–77)
PLATELETS: 279 10*3/uL (ref 150–400)
RBC: 5.15 MIL/uL (ref 4.22–5.81)
RDW: 16.1 % — ABNORMAL HIGH (ref 11.5–15.5)
WBC: 17.6 10*3/uL — ABNORMAL HIGH (ref 4.0–10.5)

## 2014-08-24 LAB — AMMONIA: AMMONIA: 20 umol/L (ref 9–35)

## 2014-08-24 LAB — GLUCOSE, CAPILLARY
GLUCOSE-CAPILLARY: 113 mg/dL — AB (ref 65–99)
Glucose-Capillary: 154 mg/dL — ABNORMAL HIGH (ref 65–99)
Glucose-Capillary: 173 mg/dL — ABNORMAL HIGH (ref 65–99)

## 2014-08-24 LAB — COMPREHENSIVE METABOLIC PANEL
ALT: 25 U/L (ref 17–63)
AST: 37 U/L (ref 15–41)
Albumin: 4.1 g/dL (ref 3.5–5.0)
Alkaline Phosphatase: 78 U/L (ref 38–126)
Anion gap: 16 — ABNORMAL HIGH (ref 5–15)
BILIRUBIN TOTAL: 0.6 mg/dL (ref 0.3–1.2)
BUN: 13 mg/dL (ref 6–20)
CO2: 27 mmol/L (ref 22–32)
Calcium: 9.3 mg/dL (ref 8.9–10.3)
Chloride: 92 mmol/L — ABNORMAL LOW (ref 101–111)
Creatinine, Ser: 1.43 mg/dL — ABNORMAL HIGH (ref 0.61–1.24)
GFR calc Af Amer: 56 mL/min — ABNORMAL LOW (ref >60)
GFR, EST NON AFRICAN AMERICAN: 48 mL/min — AB (ref >60)
Glucose, Bld: 187 mg/dL — ABNORMAL HIGH (ref 65–99)
POTASSIUM: 2.7 mmol/L — AB (ref 3.5–5.1)
SODIUM: 135 mmol/L (ref 135–145)
Total Protein: 7.1 g/dL (ref 6.5–8.1)

## 2014-08-24 LAB — I-STAT CG4 LACTIC ACID, ED
LACTIC ACID, VENOUS: 4.42 mmol/L — AB (ref 0.5–2.0)
LACTIC ACID, VENOUS: 2.58 mmol/L — AB (ref 0.5–2.0)

## 2014-08-24 LAB — STREP PNEUMONIAE URINARY ANTIGEN: STREP PNEUMO URINARY ANTIGEN: NEGATIVE

## 2014-08-24 LAB — BRAIN NATRIURETIC PEPTIDE: B Natriuretic Peptide: 60.9 pg/mL (ref 0.0–100.0)

## 2014-08-24 SURGERY — LEFT HEART CATH AND CORONARY ANGIOGRAPHY
Anesthesia: LOCAL

## 2014-08-24 MED ORDER — ACETAMINOPHEN 325 MG PO TABS
650.0000 mg | ORAL_TABLET | Freq: Once | ORAL | Status: AC
  Administered 2014-08-24: 650 mg via ORAL
  Filled 2014-08-24: qty 2

## 2014-08-24 MED ORDER — SODIUM CHLORIDE 0.9 % IV SOLN
1000.0000 mL | INTRAVENOUS | Status: DC
  Administered 2014-08-24 (×3): 1000 mL via INTRAVENOUS

## 2014-08-24 MED ORDER — ASPIRIN EC 325 MG PO TBEC
325.0000 mg | DELAYED_RELEASE_TABLET | Freq: Every day | ORAL | Status: DC
  Administered 2014-08-25 – 2014-08-27 (×3): 325 mg via ORAL
  Filled 2014-08-24 (×4): qty 1

## 2014-08-24 MED ORDER — GABAPENTIN 300 MG PO CAPS
300.0000 mg | ORAL_CAPSULE | Freq: Every day | ORAL | Status: DC
  Administered 2014-08-24 – 2014-08-26 (×3): 300 mg via ORAL
  Filled 2014-08-24 (×6): qty 1

## 2014-08-24 MED ORDER — HYDROCODONE-ACETAMINOPHEN 10-325 MG PO TABS
1.0000 | ORAL_TABLET | Freq: Four times a day (QID) | ORAL | Status: DC | PRN
  Administered 2014-08-24: 1 via ORAL
  Administered 2014-08-24: 2 via ORAL
  Administered 2014-08-24 – 2014-08-26 (×3): 1 via ORAL
  Administered 2014-08-26: 2 via ORAL
  Administered 2014-08-26 – 2014-08-27 (×2): 1 via ORAL
  Administered 2014-08-27: 2 via ORAL
  Filled 2014-08-24: qty 1
  Filled 2014-08-24: qty 2
  Filled 2014-08-24 (×7): qty 1
  Filled 2014-08-24: qty 2

## 2014-08-24 MED ORDER — ACETAMINOPHEN 325 MG PO TABS: 650.0000 mg | ORAL_TABLET | Freq: Four times a day (QID) | ORAL | Status: DC | PRN

## 2014-08-24 MED ORDER — DEXTROSE 5 % IV SOLN
500.0000 mg | INTRAVENOUS | Status: DC
  Administered 2014-08-25 – 2014-08-27 (×3): 500 mg via INTRAVENOUS
  Filled 2014-08-24 (×6): qty 500

## 2014-08-24 MED ORDER — CLONAZEPAM 1 MG PO TABS
1.0000 mg | ORAL_TABLET | Freq: Two times a day (BID) | ORAL | Status: DC
  Administered 2014-08-24 – 2014-08-27 (×6): 1 mg via ORAL
  Filled 2014-08-24 (×6): qty 1

## 2014-08-24 MED ORDER — SODIUM CHLORIDE 0.9 % IV BOLUS (SEPSIS)
1000.0000 mL | INTRAVENOUS | Status: AC
  Administered 2014-08-24 (×3): 1000 mL via INTRAVENOUS

## 2014-08-24 MED ORDER — PANTOPRAZOLE SODIUM 40 MG PO TBEC
40.0000 mg | DELAYED_RELEASE_TABLET | Freq: Every day | ORAL | Status: DC
  Administered 2014-08-25 – 2014-08-27 (×3): 40 mg via ORAL
  Filled 2014-08-24 (×2): qty 1

## 2014-08-24 MED ORDER — DEXTROSE 5 % IV SOLN
2.0000 g | INTRAVENOUS | Status: DC
  Administered 2014-08-25 – 2014-08-27 (×3): 2 g via INTRAVENOUS
  Filled 2014-08-24 (×3): qty 2

## 2014-08-24 MED ORDER — THIAMINE HCL 100 MG/ML IJ SOLN
100.0000 mg | Freq: Every day | INTRAMUSCULAR | Status: DC
  Filled 2014-08-24 (×2): qty 1

## 2014-08-24 MED ORDER — VITAMIN B-1 100 MG PO TABS
100.0000 mg | ORAL_TABLET | Freq: Every day | ORAL | Status: DC
  Administered 2014-08-24 – 2014-08-27 (×4): 100 mg via ORAL
  Filled 2014-08-24 (×5): qty 1

## 2014-08-24 MED ORDER — DEXTROSE 5 % IV SOLN
1.0000 g | Freq: Once | INTRAVENOUS | Status: DC
  Filled 2014-08-24: qty 10

## 2014-08-24 MED ORDER — CALCIUM CARBONATE-VITAMIN D 500-200 MG-UNIT PO TABS
1.0000 | ORAL_TABLET | Freq: Every day | ORAL | Status: DC
  Administered 2014-08-25 – 2014-08-27 (×3): 1 via ORAL
  Filled 2014-08-24 (×4): qty 1

## 2014-08-24 MED ORDER — POTASSIUM CHLORIDE CRYS ER 20 MEQ PO TBCR
40.0000 meq | EXTENDED_RELEASE_TABLET | ORAL | Status: AC
  Administered 2014-08-24 (×2): 40 meq via ORAL
  Filled 2014-08-24 (×2): qty 2

## 2014-08-24 MED ORDER — LORAZEPAM 2 MG/ML IJ SOLN: 1.0000 mg | Freq: Four times a day (QID) | INTRAMUSCULAR | Status: AC | PRN

## 2014-08-24 MED ORDER — INSULIN ASPART 100 UNIT/ML ~~LOC~~ SOLN
0.0000 [IU] | Freq: Three times a day (TID) | SUBCUTANEOUS | Status: DC
  Administered 2014-08-24 (×2): 2 [IU] via SUBCUTANEOUS
  Administered 2014-08-25 – 2014-08-27 (×7): 1 [IU] via SUBCUTANEOUS

## 2014-08-24 MED ORDER — POTASSIUM CHLORIDE 10 MEQ/100ML IV SOLN
10.0000 meq | Freq: Once | INTRAVENOUS | Status: AC
  Administered 2014-08-24: 10 meq via INTRAVENOUS
  Filled 2014-08-24: qty 100

## 2014-08-24 MED ORDER — ADULT MULTIVITAMIN W/MINERALS CH
1.0000 | ORAL_TABLET | Freq: Every day | ORAL | Status: DC
  Administered 2014-08-24 – 2014-08-27 (×4): 1 via ORAL
  Filled 2014-08-24 (×5): qty 1

## 2014-08-24 MED ORDER — HYDROCODONE-ACETAMINOPHEN 5-325 MG PO TABS
2.0000 | ORAL_TABLET | Freq: Once | ORAL | Status: DC
  Filled 2014-08-24: qty 2

## 2014-08-24 MED ORDER — RISAQUAD PO CAPS
1.0000 | ORAL_CAPSULE | Freq: Every day | ORAL | Status: DC
  Administered 2014-08-25 – 2014-08-27 (×3): 1 via ORAL
  Filled 2014-08-24 (×4): qty 1

## 2014-08-24 MED ORDER — CETYLPYRIDINIUM CHLORIDE 0.05 % MT LIQD
7.0000 mL | Freq: Two times a day (BID) | OROMUCOSAL | Status: DC
  Administered 2014-08-24 – 2014-08-27 (×6): 7 mL via OROMUCOSAL

## 2014-08-24 MED ORDER — ENOXAPARIN SODIUM 40 MG/0.4ML ~~LOC~~ SOLN
40.0000 mg | SUBCUTANEOUS | Status: DC
  Administered 2014-08-24 – 2014-08-27 (×4): 40 mg via SUBCUTANEOUS
  Filled 2014-08-24 (×7): qty 0.4

## 2014-08-24 MED ORDER — DEXTROSE 5 % IV SOLN
1.0000 g | Freq: Once | INTRAVENOUS | Status: AC
  Administered 2014-08-24: 1 g via INTRAVENOUS
  Filled 2014-08-24: qty 10

## 2014-08-24 MED ORDER — SODIUM CHLORIDE 0.9 % IV BOLUS (SEPSIS): 500.0000 mL | INTRAVENOUS | Status: AC

## 2014-08-24 MED ORDER — METOPROLOL SUCCINATE ER 100 MG PO TB24
100.0000 mg | ORAL_TABLET | Freq: Every day | ORAL | Status: DC
  Administered 2014-08-25 – 2014-08-27 (×3): 100 mg via ORAL
  Filled 2014-08-24 (×4): qty 1

## 2014-08-24 MED ORDER — ALLOPURINOL 300 MG PO TABS
300.0000 mg | ORAL_TABLET | Freq: Every evening | ORAL | Status: DC
Start: 2014-08-24 — End: 2014-08-27
  Administered 2014-08-24 – 2014-08-26 (×3): 300 mg via ORAL
  Filled 2014-08-24 (×5): qty 1

## 2014-08-24 MED ORDER — HYDROCODONE-ACETAMINOPHEN 10-325 MG PO TABS
1.0000 | ORAL_TABLET | Freq: Four times a day (QID) | ORAL | Status: DC | PRN
  Administered 2014-08-24: 1 via ORAL
  Filled 2014-08-24: qty 1

## 2014-08-24 MED ORDER — LORAZEPAM 1 MG PO TABS: 1.0000 mg | ORAL_TABLET | Freq: Four times a day (QID) | ORAL | Status: AC | PRN

## 2014-08-24 MED ORDER — DOCUSATE SODIUM 100 MG PO CAPS
300.0000 mg | ORAL_CAPSULE | Freq: Every day | ORAL | Status: DC
  Administered 2014-08-24: 300 mg via ORAL
  Administered 2014-08-25: 100 mg via ORAL
  Administered 2014-08-26: 300 mg via ORAL
  Filled 2014-08-24 (×5): qty 3

## 2014-08-24 MED ORDER — VENLAFAXINE HCL ER 150 MG PO CP24
150.0000 mg | ORAL_CAPSULE | Freq: Every evening | ORAL | Status: DC
  Administered 2014-08-24 – 2014-08-26 (×3): 150 mg via ORAL
  Filled 2014-08-24 (×5): qty 1

## 2014-08-24 MED ORDER — HYDROCORTISONE 1 % EX CREA
TOPICAL_CREAM | Freq: Two times a day (BID) | CUTANEOUS | Status: DC
  Administered 2014-08-24 – 2014-08-25 (×2): 1 via TOPICAL
  Administered 2014-08-25 – 2014-08-27 (×4): via TOPICAL
  Filled 2014-08-24: qty 28

## 2014-08-24 MED ORDER — AZITHROMYCIN 500 MG IV SOLR
500.0000 mg | Freq: Once | INTRAVENOUS | Status: AC
  Administered 2014-08-24: 500 mg via INTRAVENOUS
  Filled 2014-08-24: qty 500

## 2014-08-24 MED ORDER — FOLIC ACID 1 MG PO TABS
1.0000 mg | ORAL_TABLET | Freq: Every day | ORAL | Status: DC
  Administered 2014-08-24 – 2014-08-27 (×4): 1 mg via ORAL
  Filled 2014-08-24 (×5): qty 1

## 2014-08-24 NOTE — H&P (Signed)
History and Physical    [name] [name] TTS:[id number] DOB: Feb 24, 1945 DOA: 08/24/2014  Referring physician: Dr. Maryan Rued PCP: Tommy Medal, MD  Specialists: none   Chief Complaint: cough/shortness of breath   HPI: [name] [name] is a 69 y.o. male has a past medical history significant for coronary artery disease, hypertension, hyperlipidemia, anal fistula, presents to the emergency room with a chief complaint of cough and shortness of breath as well as more confusion per family. Patient, over the last 3 days, has been complaining of fever and chills, as well as a cough that has been progressively getting worse. He denies any chest pain. He denies any abdominal pain, nausea, vomiting or diarrhea. His cough was initially nonproductive, now thinks he may have something coming up. Patient denies any lightheadedness or dizziness. He has a history of anal fistula, denies any pain, redness, any problems with that area. He is followed up by surgery as an outpatient. In the emergency room, patient was initially brought in as a code STEMI due to an EKG done in the field, Dr. Burt Knack from cardiology evaluated the patient and counseled a code STEMI, as clinically patient's presentation is more consistent with sepsis and EKG changes in the ED are not suggestive of STEMI. Patient is mildly confused, however is alert and appropriate, his wife is at bedside and says that he is definitely off of his normal self. Patient endorses alcohol use, has a history of alcohol abuse, he last drink alcohol last night and had 2 beers. In the emergency room, patient was found to be febrile to 103.5, with respiratory rate of 26-30, tachycardic to 100, he has a leukocytosis of 17, and a chest x-ray suggestive of right lower lobe process. Code Sepsis was initiated, blood cultures were obtained and he received ceftriaxone and azithromycin for presumed community-acquired pneumonia. TRH was asked for admission.  Review of Systems: as per HPI  otherwise 10 point ROS negative  Past Medical History  Diagnosis Date  . Arthritis   . Hypertension   . GERD (gastroesophageal reflux disease)   . Gout   . Anal fistula   . Myocardial infarction   . CAD (coronary artery disease)     a. s/p CABG 2001; b. LHC (3/09):  S-RCA, L-LAD, S-OM1/dCFX all patent, EF 55-60%;  c. myoview (12/12):  no ischemia, EF 68%;  d. Dob Echo (1/14):  + ECG changes but normal echo => med Rx cont'd  . Hyperlipidemia    Past Surgical History  Procedure Laterality Date  . Appendectomy    . Joint replacement      Right knee  . Cholecystectomy    . Coronary artery bypass graft      2001 LIMA LAD, SVG OM/Circ dista, SVG PDA.  Cath 2009 with patent grafts  . Treatment fistula anal    . Hip arthroplasty Right 03/01/2013    Procedure: ARTHROPLASTY BIPOLAR HIP;  Surgeon: Alta Corning, MD;  Location: [facility];  Service: Orthopedics;  Laterality: Right;   Social History:  reports that he has quit smoking. He has never used smokeless tobacco. He reports that he drinks about 21.6 oz of alcohol per week. He reports that he does not use illicit drugs.  Allergies  Allergen Reactions  . Ciprofloxacin Hcl Other (See Comments)    Causes pain in tendons, feels like muscle spasms.   Family History  Problem Relation Age of Onset  . Colon cancer Mother   . Heart disease Father   . Heart disease  Sister   . Heart disease Brother   . Diabetes Sister   . Heart disease Brother   . Heart disease Brother     Prior to Admission medications   Medication Sig Start Date End Date Taking? Authorizing Provider  allopurinol (ZYLOPRIM) 300 MG tablet Take 300 mg by mouth every evening.     Historical Provider, MD  aspirin EC 325 MG tablet Take 1 tablet (325 mg total) by mouth 2 (two) times daily after a meal. Take x 1 month post op. Patient taking differently: Take 325 mg by mouth daily with breakfast.  03/01/13   Gary Fleet, PA-C  B Complex-C (B-COMPLEX WITH VITAMIN C) tablet Take  1 tablet by mouth daily with breakfast.     Historical Provider, MD  calcium-vitamin D (OSCAL WITH D) 500-200 MG-UNIT per tablet Take 1 tablet by mouth daily with breakfast.    Historical Provider, MD  cholecalciferol (VITAMIN D) 1000 UNITS tablet Take 2,000 Units by mouth daily.     Historical Provider, MD  clonazePAM (KLONOPIN) 1 MG tablet Take 1 mg by mouth 2 (two) times daily.     Historical Provider, MD  docusate sodium 100 MG CAPS Take 100 mg by mouth 2 (two) times daily as needed for mild constipation. Patient taking differently: Take 300 mg by mouth at bedtime.  03/03/13   Nishant Dhungel, MD  furosemide (LASIX) 20 MG tablet Take 1 tablet (20 mg total) by mouth as needed. Patient taking differently: Take 20 mg by mouth daily as needed for fluid.  02/23/12   Minus Breeding, MD  gabapentin (NEURONTIN) 300 MG capsule Take 300 mg by mouth at bedtime.  02/21/13   Historical Provider, MD  HYDROcodone-acetaminophen (NORCO) 10-325 MG per tablet Take 1 tablet by mouth every 6 (six) hours as needed for moderate pain.    Historical Provider, MD  MAGNESIUM OXIDE PO Take 500 mg by mouth daily with breakfast.     Historical Provider, MD  metFORMIN (GLUMETZA) 500 MG (MOD) 24 hr tablet Take 500 mg by mouth every evening.    Historical Provider, MD  metoprolol succinate (TOPROL-XL) 100 MG 24 hr tablet Take 1 tablet (100 mg total) by mouth daily. Patient taking differently: Take 100 mg by mouth 2 (two) times daily.  02/23/12   Minus Breeding, MD  nitroGLYCERIN (NITROSTAT) 0.4 MG SL tablet Place 1 tablet (0.4 mg total) under the tongue every 5 (five) minutes as needed. 02/23/12   Minus Breeding, MD  omeprazole (PRILOSEC) 40 MG capsule Take 40 mg by mouth daily with breakfast.    Historical Provider, MD  potassium gluconate 595 MG TABS Take 595 mg by mouth daily with breakfast.     Historical Provider, MD  Probiotic Product (PROBIOTIC DAILY PO) Take 1 capsule by mouth daily.     Historical Provider, MD    triamterene-hydrochlorothiazide (DYAZIDE) 37.5-25 MG per capsule Take 1 each (1 capsule total) by mouth every morning. 02/23/12   Minus Breeding, MD  triamterene-hydrochlorothiazide (MAXZIDE-25) 37.5-25 MG per tablet Take 1 tablet by mouth daily with breakfast.    Historical Provider, MD  valsartan (DIOVAN) 80 MG tablet Take 80 mg by mouth daily with breakfast.    Historical Provider, MD  venlafaxine XR (EFFEXOR-XR) 150 MG 24 hr capsule Take 150 mg by mouth every evening.    Historical Provider, MD   Physical Exam: Filed Vitals:   08/24/14 4098 08/24/14 0845 08/24/14 0850 08/24/14 0922  BP: 142/75 168/70    Pulse: 107 108 105  Temp:    102.6 F (39.2 C)  TempSrc:    Rectal  Resp: 28  26   Weight:      SpO2: 98% 96% 96%      General:  No apparent distress, flushed  Eyes: PERRL, EOMI, no scleral icterus  ENT: dry oropharynx  Neck: supple, no lymphadenopathy  Cardiovascular: regular rate without MRG; 2+ peripheral pulses, no JVD, no peripheral edema  Respiratory: CTA biL, good air movement without wheezing, rhonchi or crackled  Abdomen: soft, non tender to palpation, positive bowel sounds, no guarding, no rebound  Skin: no rashes. Anal area with mild erythema not consistent with cellulitis but more pressure from stretcher, no abscess / fluctuations / tenderness in the area   Musculoskeletal: normal bulk and tone, no joint swelling  Psychiatric: normal mood and affect  Neurologic: CN 2-12 grossly intact, MS 5/5 in all 4  Labs on Admission:  Basic Metabolic Panel:  Recent Labs Lab 08/24/14 0700  NA 135  K 2.7*  CL 92*  CO2 27  GLUCOSE 187*  BUN 13  CREATININE 1.43*  CALCIUM 9.3   Liver Function Tests:  Recent Labs Lab 08/24/14 0700  AST 37  ALT 25  ALKPHOS 78  BILITOT 0.6  PROT 7.1  ALBUMIN 4.1   CBC:  Recent Labs Lab 08/24/14 0700  WBC 17.6*  NEUTROABS 14.4*  HGB 13.6  HCT 42.1  MCV 81.7  PLT 279   BNP (last 3 results)  Recent Labs   08/24/14 0700  BNP 60.9    Radiological Exams on Admission: Dg Chest 2 View  08/24/2014   CLINICAL DATA:  There is no active cardiopulmonary disease.  EXAM: CHEST  2 VIEW  COMPARISON:  Chest x-ray and chest CT scan of December 04, 2013  FINDINGS: There is chronic elevation of the left hemidiaphragm. There is minimal atelectasis adjacent to the posterior aspect of the left hemidiaphragm. The right lung is clear. The cardiac silhouette is top-normal in size. The pulmonary vascularity is normal. The 3 upper sternal wires and the second from the top sternal band are broken. These are stable findings. No acute bony abnormality is observed.  IMPRESSION: There is subsegmental atelectasis posteriorly and inferiorly in the left lower lobe. Otherwise the examination is unchanged from the previous study.   Electronically Signed   By: Fredderick  Martinique M.D.   On: 08/24/2014 08:38    EKG: Independently reviewed. Sinus tachycardia, ST depression V1-3  Assessment/Plan Principal Problem:   Sepsis Active Problems:   Hypertension   CAD (coronary artery disease)   Hyperlipidemia   Fistula-in-ano   DM (diabetes mellitus), type 2   CAP (community acquired pneumonia)   Hypokalemia   AKI (acute kidney injury)   Alcohol abuse   Sepsis due to community acquired pneumonia - Patient with clinical picture consistent with pneumonia, with cough, fever and leukocytosis as well as mild shortness of breath. He is not hypotensive in the emergency room, will admit to telemetry. - Ceftriaxone and azithromycin - Urine Legionella and strep pneumo antigens - Blood cultures were obtained prior to antibiotics, follow-up on the results, sputum culture  Acute encephalopathy - Likely in the setting of sepsis with high fever, mild - Given history of alcohol abuse we'll also check an ammonia level, however patient not known to have underlying liver disease  Acute kidney injury - This is likely in the setting of sepsis, provide IV  fluids and repeat renal function in the morning. Of note, patient has been taking  his Lasix at home, this will be held.  Diabetes mellitus - Per patient this is relatively well controlled with metformin only, we'll obtain a hemoglobin A1c, hold metformin due to acute kidney injury while hospitalized - Sliding-scale insulin  Coronary artery disease - Patient without any cardiac symptoms, no chest pain, was evaluated by cardiology on arrival due to concern for STEMI - Monitor on telemetry  Hypertension - Hold his home antihypertensives for now and resume likely tomorrow  Hypokalemia - Likely in the setting of Lasix use at home - Replete and recheck BMP in the morning  Anal fistula - No local evidence of infection and clinically asymptomatic - sitz bath TID  Alcohol use / history of abuse - Patient may be downplaying how much he drinks, it is documented in the past he was drinking 6-8 beers per day along with vodka - Place patient on CIWA - last drink last night    Diet: heart healthy Fluids: NS DVT Prophylaxis: Lovenox  Code Status: Full  Family Communication: d/w wife and son bedside  Disposition Plan: admit to tele  Time spent: 13  Costin M. Cruzita Lederer, MD [facility] Pager [phone]  If 7PM-7AM, please contact night-coverage [email or web address] Password [facility] 08/24/2014, 9:26 AM

## 2014-08-24 NOTE — ED Notes (Signed)
Per EMS- pts wife called EMS for cough, fever sob, ams, and aggitation. Cough started several days ago. Pt was reported to be altered today. Family denies falls. Upon EMS arrival pt was noted to have ST elevation. Dr. Burt Knack, stemi team, and Dr. Burt Knack at bedside.

## 2014-08-24 NOTE — Progress Notes (Signed)
ANTIBIOTIC CONSULT NOTE - INITIAL  Pharmacy Consult for Rocephin + Azithromycin Indication: rule out sepsis  Allergies  Allergen Reactions  . Ciprofloxacin Hcl Other (See Comments)    Causes pain in tendons, feels like muscle spasms.    Patient Measurements: Weight: 193 lb 8 oz (87.771 kg)  Vital Signs: Temp: 103.5 F (39.7 C) (07/08 0809) Temp Source: Rectal (07/08 0809) BP: 148/82 mmHg (07/08 0800) Pulse Rate: 122 (07/08 0800) Intake/Output from previous day:   Intake/Output from this shift:    Labs:  Recent Labs  08/24/14 0700  WBC 17.6*  HGB 13.6  PLT 279  CREATININE 1.43*   CrCl cannot be calculated (Unknown ideal weight.). No results for input(s): VANCOTROUGH, VANCOPEAK, VANCORANDOM, GENTTROUGH, GENTPEAK, GENTRANDOM, TOBRATROUGH, TOBRAPEAK, TOBRARND, AMIKACINPEAK, AMIKACINTROU, AMIKACIN in the last 72 hours.   Microbiology: No results found for this or any previous visit (from the past 720 hour(s)).  Medical History: Past Medical History  Diagnosis Date  . Arthritis   . Hypertension   . GERD (gastroesophageal reflux disease)   . Gout   . Anal fistula   . Myocardial infarction   . CAD (coronary artery disease)     a. s/p CABG 2001; b. LHC (3/09):  S-RCA, L-LAD, S-OM1/dCFX all patent, EF 55-60%;  c. myoview (12/12):  no ischemia, EF 68%;  d. Dob Echo (1/14):  + ECG changes but normal echo => med Rx cont'd  . Hyperlipidemia     Assessment: 63 YOM who presented to the [facility] on 7/8 with SOB/fever/cough + AMS. Original concern was for STEMI however this was d/ced and changed to a code SEPSIS due to concern for sepsis related to underlying PNA. Pharmacy consulted to start Rocephin + Azithromycin for empiric coverage.   Rocephin 1g and Azithromycin 500 mg were pulled from the pyxis and handed to the RN with instructions to give the Rocephin first followed by the Azithromycin. Since concern is for sepsis, will give an additional Rocephin 1g for a total dose of  2g.   Goal of Therapy:  Proper antibiotics for infection/cultures adjusted for renal/hepatic function   Plan:  1. Rocephin 1g x 1 (in addition to 1g given in the [facility] for a total AM dose of 2g) 2. Continue Azithromycin 500 mg IV every 24 hours 3. Will continue to follow renal function, culture results, LOT, and antibiotic de-escalation plans   Alycia Rossetti, PharmD, BCPS Clinical Pharmacist Pager: [phone] 08/24/2014 9:00 AM

## 2014-08-24 NOTE — Consult Note (Signed)
CARDIOLOGY CONSULT NOTE  Patient ID: [name] [name], MRN: [id number], DOB/AGE: 1945/05/13 69 y.o. Admit date: 08/24/2014 Date of Consult: 08/24/2014  Primary Physician: Tommy Medal, MD Primary Cardiologist: none Referring Physician: Dr Maryan Rued  Chief Complaint: Cough/fever Reason for Consultation: STEMI  HPI: 69 year old gentleman with long-standing history of alcohol abuse. He has coronary artery disease with remote CABG. The patient's wife called EMS this morning because the patient had fever and confusion. He has had a cough for 2 or 3 days. EKG by EMS showed sinus tachycardia with acute inferoposterior injury current. A code STEMI was called. I evaluated the patient in the emergency department.  The patient denies any chest pain over the last few days. He has no pain at present. He denies shortness of breath. He does admit to fever and cough. He has been fatigued with poor appetite for 2 days. An EKG at the time of my evaluations shows resolution of his ST segment changes. He has no other complaints at present. He states that he last consumed alcohol yesterday.  Medical History:  Past Medical History  Diagnosis Date  . Arthritis   . Hypertension   . GERD (gastroesophageal reflux disease)   . Gout   . Anal fistula   . Myocardial infarction   . CAD (coronary artery disease)     a. s/p CABG 2001; b. LHC (3/09):  S-RCA, L-LAD, S-OM1/dCFX all patent, EF 55-60%;  c. myoview (12/12):  no ischemia, EF 68%;  d. Dob Echo (1/14):  + ECG changes but normal echo => med Rx cont'd  . Hyperlipidemia       Surgical History:  Past Surgical History  Procedure Laterality Date  . Appendectomy    . Joint replacement      Right knee  . Cholecystectomy    . Coronary artery bypass graft      2001 LIMA LAD, SVG OM/Circ dista, SVG PDA.  Cath 2009 with patent grafts  . Treatment fistula anal    . Hip arthroplasty Right 03/01/2013    Procedure: ARTHROPLASTY BIPOLAR HIP;  Surgeon: Alta Corning, MD;   Location: [facility];  Service: Orthopedics;  Laterality: Right;     Home Meds: Prior to Admission medications   Medication Sig Start Date End Date Taking? Authorizing Provider  allopurinol (ZYLOPRIM) 300 MG tablet Take 300 mg by mouth every evening.     Historical Provider, MD  aspirin EC 325 MG tablet Take 1 tablet (325 mg total) by mouth 2 (two) times daily after a meal. Take x 1 month post op. Patient taking differently: Take 325 mg by mouth daily with breakfast.  03/01/13   Gary Fleet, PA-C  B Complex-C (B-COMPLEX WITH VITAMIN C) tablet Take 1 tablet by mouth daily with breakfast.     Historical Provider, MD  calcium-vitamin D (OSCAL WITH D) 500-200 MG-UNIT per tablet Take 1 tablet by mouth daily with breakfast.    Historical Provider, MD  cholecalciferol (VITAMIN D) 1000 UNITS tablet Take 2,000 Units by mouth daily.     Historical Provider, MD  clonazePAM (KLONOPIN) 1 MG tablet Take 1 mg by mouth 2 (two) times daily.     Historical Provider, MD  docusate sodium 100 MG CAPS Take 100 mg by mouth 2 (two) times daily as needed for mild constipation. Patient taking differently: Take 300 mg by mouth at bedtime.  03/03/13   Nishant Dhungel, MD  furosemide (LASIX) 20 MG tablet Take 1 tablet (20 mg total) by mouth as needed. Patient  taking differently: Take 20 mg by mouth daily as needed for fluid.  02/23/12   Minus Breeding, MD  gabapentin (NEURONTIN) 300 MG capsule Take 300 mg by mouth at bedtime.  02/21/13   Historical Provider, MD  HYDROcodone-acetaminophen (NORCO) 10-325 MG per tablet Take 1 tablet by mouth every 6 (six) hours as needed for moderate pain.    Historical Provider, MD  MAGNESIUM OXIDE PO Take 500 mg by mouth daily with breakfast.     Historical Provider, MD  metFORMIN (GLUMETZA) 500 MG (MOD) 24 hr tablet Take 500 mg by mouth every evening.    Historical Provider, MD  metoprolol succinate (TOPROL-XL) 100 MG 24 hr tablet Take 1 tablet (100 mg total) by mouth daily. Patient taking  differently: Take 100 mg by mouth 2 (two) times daily.  02/23/12   Minus Breeding, MD  nitroGLYCERIN (NITROSTAT) 0.4 MG SL tablet Place 1 tablet (0.4 mg total) under the tongue every 5 (five) minutes as needed. 02/23/12   Minus Breeding, MD  omeprazole (PRILOSEC) 40 MG capsule Take 40 mg by mouth daily with breakfast.    Historical Provider, MD  potassium gluconate 595 MG TABS Take 595 mg by mouth daily with breakfast.     Historical Provider, MD  Probiotic Product (PROBIOTIC DAILY PO) Take 1 capsule by mouth daily.     Historical Provider, MD  triamterene-hydrochlorothiazide (DYAZIDE) 37.5-25 MG per capsule Take 1 each (1 capsule total) by mouth every morning. 02/23/12   Minus Breeding, MD  triamterene-hydrochlorothiazide (MAXZIDE-25) 37.5-25 MG per tablet Take 1 tablet by mouth daily with breakfast.    Historical Provider, MD  valsartan (DIOVAN) 80 MG tablet Take 80 mg by mouth daily with breakfast.    Historical Provider, MD  venlafaxine XR (EFFEXOR-XR) 150 MG 24 hr capsule Take 150 mg by mouth every evening.    Historical Provider, MD    Inpatient Medications:  . [START ON 08/25/2014] acidophilus  1 capsule Oral Daily  . allopurinol  300 mg Oral QPM  . [START ON 08/25/2014] aspirin EC  325 mg Oral Q breakfast  . [START ON 08/25/2014] azithromycin  500 mg Intravenous Q24H  . [START ON 08/25/2014] calcium-vitamin D  1 tablet Oral Q breakfast  . [START ON 08/25/2014] cefTRIAXone (ROCEPHIN)  IV  2 g Intravenous Q24H  . clonazePAM  1 mg Oral BID  . docusate sodium  300 mg Oral QHS  . enoxaparin (LOVENOX) injection  40 mg Subcutaneous Q24H  . folic acid  1 mg Oral Daily  . gabapentin  300 mg Oral QHS  . insulin aspart  0-9 Units Subcutaneous TID WC  . [START ON 08/25/2014] metoprolol succinate  100 mg Oral Daily  . multivitamin with minerals  1 tablet Oral Daily  . [START ON 08/25/2014] pantoprazole  40 mg Oral Daily  . thiamine  100 mg Oral Daily   Or  . thiamine  100 mg Intravenous Daily  . venlafaxine XR   150 mg Oral QPM   . sodium chloride 1,000 mL (08/24/14 1426)    Allergies:  Allergies  Allergen Reactions  . Ciprofloxacin Hcl Other (See Comments)    Causes pain in tendons, feels like muscle spasms.    History   Social History  . Marital Status: Married    Spouse Name: N/A  . Number of Children: N/A  . Years of Education: N/A   Occupational History  . Not on file.   Social History Main Topics  . Smoking status: Former Research scientist (life sciences)  .  Smokeless tobacco: Never Used  . Alcohol Use: 21.6 oz/week    36 Cans of beer per week     Comment: occasionally   . Drug Use: No  . Sexual Activity: Not on file   Other Topics Concern  . Not on file   Social History Narrative     Family History  Problem Relation Age of Onset  . Colon cancer Mother   . Heart disease Father   . Heart disease Sister   . Heart disease Brother   . Diabetes Sister   . Heart disease Brother   . Heart disease Brother      Review of Systems: General: Positive for chills, fever, negative for night sweats or weight changes. Positive for decreased appetite. ENT: negative for rhinorrhea or epistaxis Cardiovascular: negative for chest pain, shortness of breath, dyspnea on exertion, edema, orthopnea, palpitations, or paroxysmal nocturnal dyspnea Dermatological: negative for rash Respiratory: Positive for cough GI: negative for nausea, vomiting, diarrhea, bright red blood per rectum, melena, or hematemesis GU: no hematuria, urgency, or frequency Neurologic: negative for visual changes, syncope, headache, or dizziness. Positive for confusion Heme: no easy bruising or bleeding Endo: negative for excessive thirst, thyroid disorder, or flushing Musculoskeletal: negative for joint pain or swelling, negative for myalgias  All other systems reviewed and are otherwise negative except as noted above.  Physical Exam: Blood pressure 126/63, pulse 80, temperature 98.3 F (36.8 C), temperature source Oral, resp. rate  18, height 5\' 11"  (1.803 m), weight 233 lb 0.4 oz (105.7 kg), SpO2 99 %. Pt is alert and oriented, WD, WN, in no distress. HEENT: normal Neck: JVP normal. Carotid upstrokes normal without bruits. No thyromegaly. Lungs: equal expansion, clear bilaterally CV: Apex is discrete and nondisplaced, RRR grade 2/6 holosystolic murmur heard throughout murmur or gallop Abd: soft, NT, +BS, no bruit, no hepatosplenomegaly Back: no CVA tenderness Ext: no C/C/E        DP/PT pulses intact and = Skin: warm and dry without rash Neuro: CNII-XII intact             Strength intact = bilaterally    Labs: No results for input(s): CKTOTAL, CKMB, TROPONINI in the last 72 hours. Lab Results  Component Value Date   WBC 17.6* 08/24/2014   HGB 13.6 08/24/2014   HCT 42.1 08/24/2014   MCV 81.7 08/24/2014   PLT 279 08/24/2014    Recent Labs Lab 08/24/14 0700  NA 135  K 2.7*  CL 92*  CO2 27  BUN 13  CREATININE 1.43*  CALCIUM 9.3  PROT 7.1  BILITOT 0.6  ALKPHOS 78  ALT 25  AST 37  GLUCOSE 187*   No results found for: CHOL, HDL, LDLCALC, TRIG No results found for: DDIMER  Radiology/Studies:  Dg Chest 2 View  08/24/2014   CLINICAL DATA:  There is no active cardiopulmonary disease.  EXAM: CHEST  2 VIEW  COMPARISON:  Chest x-ray and chest CT scan of December 04, 2013  FINDINGS: There is chronic elevation of the left hemidiaphragm. There is minimal atelectasis adjacent to the posterior aspect of the left hemidiaphragm. The right lung is clear. The cardiac silhouette is top-normal in size. The pulmonary vascularity is normal. The 3 upper sternal wires and the second from the top sternal band are broken. These are stable findings. No acute bony abnormality is observed.  IMPRESSION: There is subsegmental atelectasis posteriorly and inferiorly in the left lower lobe. Otherwise the examination is unchanged from the previous study.   Electronically  Signed   By: Frandy  Martinique M.D.   On: 08/24/2014 08:38    EKG:  Sinus tachycardia 124 bpm, diffuse anterior ST segment depression consider anterior ischemia versus posterior injury  ASSESSMENT AND PLAN:  69 year old gentleman with clinical picture consistent with pneumonia and/or sepsis. He does not have symptoms consistent with STEMI. I would recommend supportive care with IV and antibiotics and further evaluation as per the [facility] service. From a cardiac perspective, would cycle cardiac enzymes, check 2D echo, and consider addition of heparin if enzymes are significantly elevated. No plans for cardiac catheterization unless cardiac symptoms arise. Initial troponin and BNP are normal. Please call if any active cardiac issues arise.  SignedSherren Mocha MD, [facility] 08/24/2014, 3:52 PM

## 2014-08-24 NOTE — ED Notes (Signed)
Dr. Maryan Rued made aware of temp.

## 2014-08-24 NOTE — ED Provider Notes (Addendum)
CSN: [id number]     Arrival date & time 08/24/14  0753 History   First MD Initiated Contact with Patient 08/24/14 [id number]     Chief Complaint  Patient presents with  . Cough  . Shortness of Breath     (Consider location/radiation/quality/duration/timing/severity/associated sxs/prior Treatment) Patient is a 69 y.o. male presenting with cough and shortness of breath. The history is provided by the patient and the EMS personnel. The history is limited by the condition of the patient.  Cough Cough characteristics:  Non-productive Severity:  Moderate Onset quality:  Gradual Duration:  3 days Timing:  Constant Progression:  Worsening Chronicity:  New Smoker: no   Context: upper respiratory infection   Associated symptoms: fever and shortness of breath   Associated symptoms comment:  Ams this morning and fever for the first time last night Shortness of Breath Associated symptoms: cough and fever     Past Medical History  Diagnosis Date  . Arthritis   . Hypertension   . GERD (gastroesophageal reflux disease)   . Gout   . Anal fistula   . Myocardial infarction   . CAD (coronary artery disease)     a. s/p CABG 2001; b. LHC (3/09):  S-RCA, L-LAD, S-OM1/dCFX all patent, EF 55-60%;  c. myoview (12/12):  no ischemia, EF 68%;  d. Dob Echo (1/14):  + ECG changes but normal echo => med Rx cont'd  . Hyperlipidemia    Past Surgical History  Procedure Laterality Date  . Appendectomy    . Joint replacement      Right knee  . Cholecystectomy    . Coronary artery bypass graft      2001 LIMA LAD, SVG OM/Circ dista, SVG PDA.  Cath 2009 with patent grafts  . Treatment fistula anal    . Hip arthroplasty Right 03/01/2013    Procedure: ARTHROPLASTY BIPOLAR HIP;  Surgeon: Alta Corning, MD;  Location: [facility];  Service: Orthopedics;  Laterality: Right;   Family History  Problem Relation Age of Onset  . Colon cancer Mother   . Heart disease Father   . Heart disease Sister   . Heart disease  Brother   . Diabetes Sister   . Heart disease Brother   . Heart disease Brother    History  Substance Use Topics  . Smoking status: Former Research scientist (life sciences)  . Smokeless tobacco: Never Used  . Alcohol Use: 21.6 oz/week    36 Cans of beer per week     Comment: occasionally     Review of Systems  Unable to perform ROS Constitutional: Positive for fever.  Respiratory: Positive for cough and shortness of breath.       Allergies  Ciprofloxacin hcl  Home Medications   Prior to Admission medications   Medication Sig Start Date End Date Taking? Authorizing Provider  allopurinol (ZYLOPRIM) 300 MG tablet Take 300 mg by mouth every evening.     Historical Provider, MD  aspirin EC 325 MG tablet Take 1 tablet (325 mg total) by mouth 2 (two) times daily after a meal. Take x 1 month post op. Patient taking differently: Take 325 mg by mouth daily with breakfast.  03/01/13   Gary Fleet, PA-C  B Complex-C (B-COMPLEX WITH VITAMIN C) tablet Take 1 tablet by mouth daily with breakfast.     Historical Provider, MD  calcium-vitamin D (OSCAL WITH D) 500-200 MG-UNIT per tablet Take 1 tablet by mouth daily with breakfast.    Historical Provider, MD  cholecalciferol (VITAMIN D) 1000  UNITS tablet Take 2,000 Units by mouth daily.     Historical Provider, MD  clonazePAM (KLONOPIN) 1 MG tablet Take 1 mg by mouth 2 (two) times daily.     Historical Provider, MD  docusate sodium 100 MG CAPS Take 100 mg by mouth 2 (two) times daily as needed for mild constipation. Patient taking differently: Take 300 mg by mouth at bedtime.  03/03/13   Nishant Dhungel, MD  furosemide (LASIX) 20 MG tablet Take 1 tablet (20 mg total) by mouth as needed. Patient taking differently: Take 20 mg by mouth daily as needed for fluid.  02/23/12   Minus Breeding, MD  gabapentin (NEURONTIN) 300 MG capsule Take 300 mg by mouth at bedtime.  02/21/13   Historical Provider, MD  HYDROcodone-acetaminophen (NORCO) 10-325 MG per tablet Take 1 tablet by mouth  every 6 (six) hours as needed for moderate pain.    Historical Provider, MD  MAGNESIUM OXIDE PO Take 500 mg by mouth daily with breakfast.     Historical Provider, MD  metFORMIN (GLUMETZA) 500 MG (MOD) 24 hr tablet Take 500 mg by mouth every evening.    Historical Provider, MD  metoprolol succinate (TOPROL-XL) 100 MG 24 hr tablet Take 1 tablet (100 mg total) by mouth daily. Patient taking differently: Take 100 mg by mouth 2 (two) times daily.  02/23/12   Minus Breeding, MD  nitroGLYCERIN (NITROSTAT) 0.4 MG SL tablet Place 1 tablet (0.4 mg total) under the tongue every 5 (five) minutes as needed. 02/23/12   Minus Breeding, MD  omeprazole (PRILOSEC) 40 MG capsule Take 40 mg by mouth daily with breakfast.    Historical Provider, MD  potassium gluconate 595 MG TABS Take 595 mg by mouth daily with breakfast.     Historical Provider, MD  Probiotic Product (PROBIOTIC DAILY PO) Take 1 capsule by mouth daily.     Historical Provider, MD  triamterene-hydrochlorothiazide (DYAZIDE) 37.5-25 MG per capsule Take 1 each (1 capsule total) by mouth every morning. 02/23/12   Minus Breeding, MD  triamterene-hydrochlorothiazide (MAXZIDE-25) 37.5-25 MG per tablet Take 1 tablet by mouth daily with breakfast.    Historical Provider, MD  valsartan (DIOVAN) 80 MG tablet Take 80 mg by mouth daily with breakfast.    Historical Provider, MD  venlafaxine XR (EFFEXOR-XR) 150 MG 24 hr capsule Take 150 mg by mouth every evening.    Historical Provider, MD   BP 148/82 mmHg  Pulse 122  Resp 30  SpO2 94% Physical Exam  Constitutional: He appears well-developed and well-nourished. No distress.  HENT:  Head: Normocephalic and atraumatic.  Mouth/Throat: Oropharynx is clear and moist.  Eyes: Conjunctivae and EOM are normal. Pupils are equal, round, and reactive to light.  Neck: Normal range of motion. Neck supple.  Cardiovascular: Regular rhythm and intact distal pulses.  Tachycardia present.   Murmur heard.  Systolic murmur is  present with a grade of 1/6  Pulmonary/Chest: Effort normal. No respiratory distress. He has no wheezes. He has rhonchi in the left lower field. He has no rales.  Abdominal: Soft. He exhibits no distension. There is no tenderness. There is no rebound and no guarding.  Musculoskeletal: Normal range of motion. He exhibits no edema or tenderness.  Neurological: He is alert.  Oriented to person and place but confused on time and age  Skin: Skin is warm. No rash noted. He is diaphoretic. No erythema. There is pallor.  Psychiatric: He has a normal mood and affect. His behavior is normal.  Nursing note  and vitals reviewed.   ED Course  Procedures (including critical care time) Labs Review Labs Reviewed  COMPREHENSIVE METABOLIC PANEL - Abnormal; Notable for the following:    Potassium 2.7 (*)    Chloride 92 (*)    Glucose, Bld 187 (*)    Creatinine, Ser 1.43 (*)    GFR calc non Af Amer 48 (*)    GFR calc Af Amer 56 (*)    Anion gap 16 (*)    All other components within normal limits  CBC WITH DIFFERENTIAL/PLATELET - Abnormal; Notable for the following:    WBC 17.6 (*)    RDW 16.1 (*)    Neutrophils Relative % 82 (*)    Neutro Abs 14.4 (*)    All other components within normal limits  I-STAT CG4 LACTIC ACID, ED - Abnormal; Notable for the following:    Lactic Acid, Venous 4.42 (*)    All other components within normal limits  CULTURE, BLOOD (ROUTINE X 2)  CULTURE, BLOOD (ROUTINE X 2)  URINE CULTURE  URINALYSIS, ROUTINE W REFLEX MICROSCOPIC (NOT AT [facility])  BRAIN NATRIURETIC PEPTIDE  I-STAT TROPOININ, ED    Imaging Review Dg Chest 2 View  08/24/2014   CLINICAL DATA:  There is no active cardiopulmonary disease.  EXAM: CHEST  2 VIEW  COMPARISON:  Chest x-ray and chest CT scan of December 04, 2013  FINDINGS: There is chronic elevation of the left hemidiaphragm. There is minimal atelectasis adjacent to the posterior aspect of the left hemidiaphragm. The right lung is clear. The cardiac  silhouette is top-normal in size. The pulmonary vascularity is normal. The 3 upper sternal wires and the second from the top sternal band are broken. These are stable findings. No acute bony abnormality is observed.  IMPRESSION: There is subsegmental atelectasis posteriorly and inferiorly in the left lower lobe. Otherwise the examination is unchanged from the previous study.   Electronically Signed   By: Raphel  Martinique M.D.   On: 08/24/2014 08:38     EKG Interpretation   Date/Time:  Friday August 24 2014 07:55:24 EDT Ventricular Rate:  124 PR Interval:    QRS Duration: 110 QT Interval:  433 QTC Calculation: 622 R Axis:   76 Text Interpretation:  sinus  tachycardia Posterior infarct, acute (LCx)  Prolonged QT interval new ST depression V1-V3, suggest recording posterior  leads Baseline wander in lead(s) II III aVR aVL aVF Confirmed by Maryan Rued   MD, Loree Fee ([id number]) on 08/24/2014 8:10:08 AM      MDM   Final diagnoses:  None    Patient is a 69 year old male with a history of coronary artery disease status post CABG in 2001 and positive EKG changes in 2014 but a normal echo and managed medically with a history of chronic alcohol use around a 6 pack a day presents today by EMS initially as a code STEMI. Since initial EKG was tachycardic with concern for inferior MI with reciprocal changes. However patient's story is not consistent with heart attack. He denies any chest pain but was more concerned about a cough, fever and altered mental status that's been worsening over the last 3-4 days. Patient is mildly confused on exam but denies chest pain or shortness of breath.  With improvement in heart rate EKG showed persistent ST depression in anterior leads with resolution of ST elevation in inferior leads. Patient was given aspirin in route but upon arrival here Dr. Burt Knack with cardiology saw the patient and at this time do not feel that  this is a code STEMI but most likely a code sepsis. Patient has  rhonchi in the left lower lobe, tachycardia, fever of 103 and altered mental status which all are more concerning for code sepsis. A code sepsis was called. Order set was initiated.  8:48 AM Chest x-ray consistent with left lower lobe pneumonia. Leukocytosis of 17,000, lactate of 4.4, hypokalemia of 2.7 and new acute kidney injury with a creatinine of 1.4. Patient initiated on community-acquired pneumonia protocol with Rocephin and azithromycin.  Blood pressure remained stable.  CRITICAL CARE Performed by: Blanchie Dessert Total critical care time: 40 Critical care time was exclusive of separately billable procedures and treating other patients. Critical care was necessary to treat or prevent imminent or life-threatening deterioration. Critical care was time spent personally by me on the following activities: development of treatment plan with patient and/or surrogate as well as nursing, discussions with consultants, evaluation of patient's response to treatment, examination of patient, obtaining history from patient or surrogate, ordering and performing treatments and interventions, ordering and review of laboratory studies, ordering and review of radiographic studies, pulse oximetry and re-evaluation of patient's condition.  Blanchie Dessert, MD 08/24/14 1287  Blanchie Dessert, MD 08/24/14 [phone]

## 2014-08-24 NOTE — ED Notes (Signed)
Cancelled Code Stemi, Activated Code Sepsis

## 2014-08-24 NOTE — ED Notes (Signed)
MD at bedside. 

## 2014-08-24 NOTE — ED Notes (Signed)
Pt was transported to xray with this rn

## 2014-08-25 DIAGNOSIS — N189 Chronic kidney disease, unspecified: Secondary | ICD-10-CM

## 2014-08-25 DIAGNOSIS — G9340 Encephalopathy, unspecified: Secondary | ICD-10-CM

## 2014-08-25 DIAGNOSIS — J189 Pneumonia, unspecified organism: Secondary | ICD-10-CM

## 2014-08-25 DIAGNOSIS — IMO0001 Reserved for inherently not codable concepts without codable children: Secondary | ICD-10-CM | POA: Insufficient documentation

## 2014-08-25 DIAGNOSIS — R778 Other specified abnormalities of plasma proteins: Secondary | ICD-10-CM | POA: Insufficient documentation

## 2014-08-25 DIAGNOSIS — N179 Acute kidney failure, unspecified: Secondary | ICD-10-CM

## 2014-08-25 DIAGNOSIS — A419 Sepsis, unspecified organism: Principal | ICD-10-CM

## 2014-08-25 DIAGNOSIS — E876 Hypokalemia: Secondary | ICD-10-CM

## 2014-08-25 DIAGNOSIS — R7989 Other specified abnormal findings of blood chemistry: Secondary | ICD-10-CM

## 2014-08-25 LAB — CBC
HCT: 32.8 % — ABNORMAL LOW (ref 39.0–52.0)
Hemoglobin: 10.3 g/dL — ABNORMAL LOW (ref 13.0–17.0)
MCH: 25.8 pg — ABNORMAL LOW (ref 26.0–34.0)
MCHC: 31.4 g/dL (ref 30.0–36.0)
MCV: 82.2 fL (ref 78.0–100.0)
Platelets: 239 K/uL (ref 150–400)
RBC: 3.99 MIL/uL — ABNORMAL LOW (ref 4.22–5.81)
RDW: 16.4 % — ABNORMAL HIGH (ref 11.5–15.5)
WBC: 12.8 K/uL — ABNORMAL HIGH (ref 4.0–10.5)

## 2014-08-25 LAB — HIV ANTIBODY (ROUTINE TESTING W REFLEX): HIV Screen 4th Generation wRfx: NONREACTIVE

## 2014-08-25 LAB — TROPONIN I
Troponin I: 1.01 ng/mL (ref <0.031)
Troponin I: 1 ng/mL (ref <0.031)
Troponin I: 0.93 ng/mL

## 2014-08-25 LAB — COMPREHENSIVE METABOLIC PANEL
ALBUMIN: 3 g/dL — AB (ref 3.5–5.0)
ALK PHOS: 50 U/L (ref 38–126)
ALT: 19 U/L (ref 17–63)
AST: 24 U/L (ref 15–41)
Anion gap: 7 (ref 5–15)
BUN: 9 mg/dL (ref 6–20)
CHLORIDE: 100 mmol/L — AB (ref 101–111)
CO2: 31 mmol/L (ref 22–32)
Calcium: 8 mg/dL — ABNORMAL LOW (ref 8.9–10.3)
Creatinine, Ser: 1.07 mg/dL (ref 0.61–1.24)
GFR calc non Af Amer: >60 mL/min (ref >60)
GLUCOSE: 133 mg/dL — AB (ref 65–99)
Potassium: 3.3 mmol/L — ABNORMAL LOW (ref 3.5–5.1)
Sodium: 138 mmol/L (ref 135–145)
Total Bilirubin: 0.5 mg/dL (ref 0.3–1.2)
Total Protein: 5.5 g/dL — ABNORMAL LOW (ref 6.5–8.1)

## 2014-08-25 LAB — URINE CULTURE: Culture: NO GROWTH

## 2014-08-25 LAB — GLUCOSE, CAPILLARY
GLUCOSE-CAPILLARY: 144 mg/dL — AB (ref 65–99)
Glucose-Capillary: 148 mg/dL — ABNORMAL HIGH (ref 65–99)
Glucose-Capillary: 136 mg/dL — ABNORMAL HIGH (ref 65–99)
Glucose-Capillary: 115 mg/dL — ABNORMAL HIGH (ref 65–99)

## 2014-08-25 LAB — MAGNESIUM: MAGNESIUM: 1.5 mg/dL — AB (ref 1.7–2.4)

## 2014-08-25 LAB — HEMOGLOBIN A1C
HEMOGLOBIN A1C: 6.9 % — AB (ref 4.8–5.6)
MEAN PLASMA GLUCOSE: 151 mg/dL

## 2014-08-25 MED ORDER — MAGNESIUM SULFATE 2 GM/50ML IV SOLN
2.0000 g | Freq: Once | INTRAVENOUS | Status: AC
  Administered 2014-08-25: 2 g via INTRAVENOUS
  Filled 2014-08-25: qty 50

## 2014-08-25 MED ORDER — POTASSIUM CHLORIDE CRYS ER 20 MEQ PO TBCR
40.0000 meq | EXTENDED_RELEASE_TABLET | Freq: Once | ORAL | Status: AC
  Administered 2014-08-25: 40 meq via ORAL
  Filled 2014-08-25: qty 2

## 2014-08-25 NOTE — Progress Notes (Signed)
PROGRESS NOTE  [name] [name] [id number] DOB: January 27, 1946 DOA: 08/24/2014 PCP: Tommy Medal, MD  brief history  69 year old male with a history of CAD an MI in 2001, hypertension, hyperlipidemia diabetes mellitus presented with 3 day history of fevers, chills, shortness of breath, and confusion. Because of his shortness of breath, the patient took 2 days' worth of furosemide on 08/22/2014 on 08/23/2014. The patient states that he has been weighing himself regularly, and denies any recent increase in his weight. The patient denies any headache, chest discomfort, nausea, vomiting, diarrhea, dizziness, syncope. Denies any  Abdominal pain, dysuria, hematuria. Workup in the emergency department revealed  WBC 17.8 with serum creatinine 1.43 and chest x-ray suggestive of right lower lobe infiltrate.   Blood cultures were obtained and the patient was started on intravenous antibiotics  Assessment/Plan:  sepsis - secondary to  Pneumonia - sepsis physiology has improved - continue antibiotics pending culture data  Community acquired pneumonia - continue ceftriaxone and azithromycin -wean oxygen for saturation >92%  Acute encephalopathy - secondary to sepsis and acute on chronic renal failure - patient is back at baseline according to the family on 08/25/2014  Acute on chronic renal failure (CKD 2-3) - improved with IV fluids - Secondary to volume depletion - Baseline creatinine 1.0-1.3 Alcohol dependence - patient drinks 2-4 beers daily - alcohol withdraw protocol  Diabetes mellitus type 2 - 08/24/2014 hemoglobin A1c 6.9 - hold metformin - continue NovoLog sliding scale  Coronary artery disease with history of MI - EKG abnormal with downsloping of ST segments - cycle troponins - continue aspirin - continue metoprolol succinate  Hypertension - continue metoprolol succinate - hold ARB and Dyazide secondary to acute renal failure Hypokalemia - replete hypomagnesemia -  replete nonsustained ventricular tachycardia - remain on telemetry - patient is asymptomatic - maintain potassium greater than 4 and magnesium greater than 2  Family Communication:   Son updated at beside Disposition Plan:   Home 1-2 days       Procedures/Studies: Dg Chest 2 View  08/24/2014   CLINICAL DATA:  There is no active cardiopulmonary disease.  EXAM: CHEST  2 VIEW  COMPARISON:  Chest x-ray and chest CT scan of December 04, 2013  FINDINGS: There is chronic elevation of the left hemidiaphragm. There is minimal atelectasis adjacent to the posterior aspect of the left hemidiaphragm. The right lung is clear. The cardiac silhouette is top-normal in size. The pulmonary vascularity is normal. The 3 upper sternal wires and the second from the top sternal band are broken. These are stable findings. No acute bony abnormality is observed.  IMPRESSION: There is subsegmental atelectasis posteriorly and inferiorly in the left lower lobe. Otherwise the examination is unchanged from the previous study.   Electronically Signed   By: Todd  Martinique M.D.   On: 08/24/2014 08:38         Subjective: Patient denies fevers, chills, headache, chest pain,  nausea, vomiting, diarrhea, abdominal pain, dysuria, hematuria. Patient is breathing much better this morning. He still has a nonproductive cough.   Objective: Filed Vitals:   08/24/14 1811 08/24/14 2058 08/24/14 2320 08/25/14 0541  BP: 144/84 146/71 142/71 159/69  Pulse: 85 77 80 90  Temp: 98.3 F (36.8 C) 97.7 F (36.5 C) 98.5 F (36.9 C) 98.5 F (36.9 C)  TempSrc: Oral Oral Oral Oral  Resp: 18 18 20 18   Height:      Weight:      SpO2: 97%  100% 99% 99%    Intake/Output Summary (Last 24 hours) at 08/25/14 1046 Last data filed at 08/25/14 0925  Gross per 24 hour  Intake 2984.17 ml  Output   2711 ml  Net 273.17 ml   Weight change:  Exam:   General:  Pt is alert, follows commands appropriately, not in acute distress  HEENT: No  icterus, No thrush, No neck mass, Bryson/AT;  No meningismus  Cardiovascular: RRR, S1/S2, no rubs, no gallops  Respiratory:  Bibasilar crackles. Left foot auscultation. No wheeze  Abdomen: Soft/+BS, non tender, non distended, no guarding;  No hepatosplenomegaly  Extremities: No edema, No lymphangitis, No petechiae, No rashes, no synovitis; no cyanosis clubbing  Data Reviewed: Basic Metabolic Panel:  Recent Labs Lab 08/24/14 0700 08/25/14 0435  NA 135 138  K 2.7* 3.3*  CL 92* 100*  CO2 27 31  GLUCOSE 187* 133*  BUN 13 9  CREATININE 1.43* 1.07  CALCIUM 9.3 8.0*  MG  --  1.5*   Liver Function Tests:  Recent Labs Lab 08/24/14 0700 08/25/14 0435  AST 37 24  ALT 25 19  ALKPHOS 78 50  BILITOT 0.6 0.5  PROT 7.1 5.5*  ALBUMIN 4.1 3.0*   No results for input(s): LIPASE, AMYLASE in the last 168 hours.  Recent Labs Lab 08/24/14 0937  AMMONIA 20   CBC:  Recent Labs Lab 08/24/14 0700 08/25/14 0435  WBC 17.6* 12.8*  NEUTROABS 14.4*  --   HGB 13.6 10.3*  HCT 42.1 32.8*  MCV 81.7 82.2  PLT 279 239   Cardiac Enzymes: No results for input(s): CKTOTAL, CKMB, CKMBINDEX, TROPONINI in the last 168 hours. BNP: Invalid input(s): POCBNP CBG:  Recent Labs Lab 08/24/14 1247 08/24/14 1635 08/24/14 2125 08/25/14 0811  GLUCAP 154* 173* 113* 144*    Recent Results (from the past 240 hour(s))  Urine culture     Status: None   Collection Time: 08/24/14  8:47 AM  Result Value Ref Range Status   Specimen Description URINE, RANDOM  Final   Special Requests NONE  Final   Culture NO GROWTH 1 DAY  Final   Report Status 08/25/2014 FINAL  Final  Culture, sputum-assessment     Status: None   Collection Time: 08/24/14  1:57 PM  Result Value Ref Range Status   Specimen Description SPUTUM  Final   Special Requests NONE  Final   Sputum evaluation   Final    MICROSCOPIC FINDINGS SUGGEST THAT THIS SPECIMEN IS NOT REPRESENTATIVE OF LOWER RESPIRATORY SECRETIONS. PLEASE  RECOLLECT. Results Called to: A [facility] AT 9811 08/24/14 BY L BENFIELD    Report Status 08/24/2014 FINAL  Final     Scheduled Meds: . acidophilus  1 capsule Oral Daily  . allopurinol  300 mg Oral QPM  . antiseptic oral rinse  7 mL Mouth Rinse BID  . aspirin EC  325 mg Oral Q breakfast  . azithromycin  500 mg Intravenous Q24H  . calcium-vitamin D  1 tablet Oral Q breakfast  . cefTRIAXone (ROCEPHIN)  IV  2 g Intravenous Q24H  . clonazePAM  1 mg Oral BID  . docusate sodium  300 mg Oral QHS  . enoxaparin (LOVENOX) injection  40 mg Subcutaneous Q24H  . folic acid  1 mg Oral Daily  . gabapentin  300 mg Oral QHS  . hydrocortisone cream   Topical BID  . insulin aspart  0-9 Units Subcutaneous TID WC  . magnesium sulfate 1 - 4 g bolus IVPB  2 g  Intravenous Once  . metoprolol succinate  100 mg Oral Daily  . multivitamin with minerals  1 tablet Oral Daily  . pantoprazole  40 mg Oral Daily  . potassium chloride  40 mEq Oral Once  . thiamine  100 mg Oral Daily   Or  . thiamine  100 mg Intravenous Daily  . venlafaxine XR  150 mg Oral QPM   Continuous Infusions:    Elyna Pangilinan, Nathanael, DO  [facility] Pager [phone]  If 7PM-7AM, please contact night-coverage [email or web address] Password [email or web address] 08/25/2014, 10:46 AM   LOS: 1 day

## 2014-08-25 NOTE — Progress Notes (Signed)
Patient Name: [name] [name] Date of Encounter: 08/25/2014     Principal Problem:   Sepsis Active Problems:   Hypertension   CAD (coronary artery disease)   Hyperlipidemia   Fist[place]-in-[place]   DM (diabetes mellitus), type 2   CAP (community acquired pneumonia)   Hypokalemia   AKI (acute kidney injury)   Alcohol abuse   Acute encephalopathy   Acute on chronic renal failure   Blood poisoning    SUBJECTIVE  The patient denies any precordial chest discomfort.  He is still sore in his right lateral chest from coughing so hard.  His electrocardiogram today shows resolution of the previous ST-T wave changes.  CURRENT MEDS . acidophilus  1 capsule Oral Daily  . allopurinol  300 mg Oral QPM  . antiseptic oral rinse  7 mL Mouth Rinse BID  . aspirin EC  325 mg Oral Q breakfast  . azithromycin  500 mg Intravenous Q24H  . calcium-vitamin D  1 tablet Oral Q breakfast  . cefTRIAXone (ROCEPHIN)  IV  2 g Intravenous Q24H  . clonazePAM  1 mg Oral BID  . docusate sodium  300 mg Oral QHS  . enoxaparin (LOVENOX) injection  40 mg Subcutaneous Q24H  . folic acid  1 mg Oral Daily  . gabapentin  300 mg Oral QHS  . hydrocortisone cream   Topical BID  . insulin aspart  0-9 Units Subcutaneous TID WC  . metoprolol succinate  100 mg Oral Daily  . multivitamin with minerals  1 tablet Oral Daily  . pantoprazole  40 mg Oral Daily  . thiamine  100 mg Oral Daily   Or  . thiamine  100 mg Intravenous Daily  . venlafaxine XR  150 mg Oral QPM    OBJECTIVE  Filed Vitals:   08/24/14 2058 08/24/14 2320 08/25/14 0541 08/25/14 1419  BP: 146/71 142/71 159/69 142/82  Pulse: 77 80 90 72  Temp: 97.7 F (36.5 C) 98.5 F (36.9 C) 98.5 F (36.9 C) 98.6 F (37 C)  TempSrc: Oral Oral Oral   Resp: 18 20 18 18   Height:      Weight:      SpO2: 100% 99% 99% 100%    Intake/Output Summary (Last 24 hours) at 08/25/14 1754 Last data filed at 08/25/14 1559  Gross per 24 hour  Intake 1704.17 ml  Output   1001  ml  Net 703.17 ml   Filed Weights   08/24/14 0808 08/24/14 1244  Weight: 193 lb 8 oz (87.771 kg) 233 lb 0.4 oz (105.7 kg)    PHYSICAL EXAM  General: Pleasant, NAD.  Lying flat in no distress  Neuro: Alert and oriented X 3. Moves all extremities spontaneously. Psych: Normal affect. HEENT:  Normal  Neck: Supple without bruits or JVD. Lungs:  Resp regular and unlabored, scattered high pitched rhonchi bilaterally. Heart: RRR no s3, s4,.  There is a grade 2/6 systolic ejection murmur at the base.  No pericardial rub. Abdomen: Soft, non-tender, non-distended, BS + x 4.  Extremities: No clubbing, cyanosis or edema. DP/PT/Radials 2+ and equal bilaterally.  Accessory Clinical Findings  CBC  Recent Labs  08/24/14 0700 08/25/14 0435  WBC 17.6* 12.8*  NEUTROABS 14.4*  --   HGB 13.6 10.3*  HCT 42.1 32.8*  MCV 81.7 82.2  PLT 279 063   Basic Metabolic Panel  Recent Labs  08/24/14 0700 08/25/14 0435  NA 135 138  K 2.7* 3.3*  CL 92* 100*  CO2 27 31  GLUCOSE  187* 133*  BUN 13 9  CREATININE 1.43* 1.07  CALCIUM 9.3 8.0*  MG  --  1.5*   Liver Function Tests  Recent Labs  08/24/14 0700 08/25/14 0435  AST 37 24  ALT 25 19  ALKPHOS 78 50  BILITOT 0.6 0.5  PROT 7.1 5.5*  ALBUMIN 4.1 3.0*   No results for input(s): LIPASE, AMYLASE in the last 72 hours. Cardiac Enzymes  Recent Labs  08/25/14 1229  TROPONINI 1.01*   BNP Invalid input(s): POCBNP D-Dimer No results for input(s): DDIMER in the last 72 hours. Hemoglobin A1C  Recent Labs  08/24/14 0937  HGBA1C 6.9*   Fasting Lipid Panel No results for input(s): CHOL, HDL, LDLCALC, TRIG, CHOLHDL, LDLDIRECT in the last 72 hours. Thyroid Function Tests No results for input(s): TSH, T4TOTAL, T3FREE, THYROIDAB in the last 72 hours.  Invalid input(s): FREET3  TELE  Normal sinus rhythm with occasional PVCs.  At 9:30 this morning he had several short runs of nonsustained monomorphic V. tach which were  asymptomatic  ECG  Normal sinus rhythm with first-degree AV block.  Since the previous tracing of 08/24/14, the previous ST segment downsloping depression has resolved and the rate has slowed.  Radiology/Studies  Dg Chest 2 View  08/24/2014   CLINICAL DATA:  There is no active cardiopulmonary disease.  EXAM: CHEST  2 VIEW  COMPARISON:  Chest x-ray and chest CT scan of December 04, 2013  FINDINGS: There is chronic elevation of the left hemidiaphragm. There is minimal atelectasis adjacent to the posterior aspect of the left hemidiaphragm. The right lung is clear. The cardiac silhouette is top-normal in size. The pulmonary vascularity is normal. The 3 upper sternal wires and the second from the top sternal band are broken. These are stable findings. No acute bony abnormality is observed.  IMPRESSION: There is subsegmental atelectasis posteriorly and inferiorly in the left lower lobe. Otherwise the examination is unchanged from the previous study.   Electronically Signed   By: Michelle  Martinique M.D.   On: 08/24/2014 08:38    ASSESSMENT AND PLAN  1.  Ischemic heart disease with previous CABG in 2001. 2.  Elevated troponin this admission probably secondary to supply demand mismatch secondary to stress of acute respiratory disease and tachycardia.  His chest pain is in the right lateral chest and appears to be musculoskeletal from heavy coughing.  He has not been having any precordial heaviness to suggest angina pectoris.  Plan: Continue to treat his underlying pulmonary illness.  Continue aspirin and metoprolol. 2-D echocardiogram pending.  Signed, Darlin Coco MD

## 2014-08-25 NOTE — Progress Notes (Signed)
CRITICAL VALUE ALERT  Critical value received:  Troponin 1.01  Date of notification:  08/25/14   Time of notification:  8185  Critical value read back:Yes.    Nurse who received alert:  Hendricks Limes RN  MD notified (1st page):  MD Tat  Time of first page:  1350  Responding MD:  MD Tat  Time MD responded:  1400, 12-Lead EKG Ordered.

## 2014-08-25 NOTE — Progress Notes (Signed)
MD Tat paged and aware, patient had multiple beats of VTACH per central telemetry CV strip, patient now currently in 1st degree heart block (baseline since this admission).

## 2014-08-25 NOTE — Progress Notes (Signed)
Notified by RN pt with troponin 1.01  Pt had new EKG changes at time of admission with ST depression and ST downward sloping V1-V4.  Pt has had previous CABG in 2001. I have consulted cardiology.  Continue ASA 325mg  and metoprolol succinate Repeat EKG today. Finish cycling troponins.

## 2014-08-26 ENCOUNTER — Inpatient Hospital Stay (HOSPITAL_COMMUNITY): Payer: Medicare Other

## 2014-08-26 DIAGNOSIS — N179 Acute kidney failure, unspecified: Secondary | ICD-10-CM

## 2014-08-26 DIAGNOSIS — R079 Chest pain, unspecified: Secondary | ICD-10-CM

## 2014-08-26 DIAGNOSIS — N189 Chronic kidney disease, unspecified: Secondary | ICD-10-CM

## 2014-08-26 LAB — CBC
HCT: 32.8 % — ABNORMAL LOW (ref 39.0–52.0)
Hemoglobin: 10.3 g/dL — ABNORMAL LOW (ref 13.0–17.0)
MCH: 26.4 pg (ref 26.0–34.0)
MCHC: 31.4 g/dL (ref 30.0–36.0)
MCV: 84.1 fL (ref 78.0–100.0)
PLATELETS: 224 10*3/uL (ref 150–400)
RBC: 3.9 MIL/uL — AB (ref 4.22–5.81)
RDW: 16.7 % — ABNORMAL HIGH (ref 11.5–15.5)
WBC: 9.7 10*3/uL (ref 4.0–10.5)

## 2014-08-26 LAB — BASIC METABOLIC PANEL
Anion gap: 6 (ref 5–15)
BUN: <5 mg/dL — ABNORMAL LOW (ref 6–20)
CHLORIDE: 102 mmol/L (ref 101–111)
CO2: 30 mmol/L (ref 22–32)
CREATININE: 1.01 mg/dL (ref 0.61–1.24)
Calcium: 8.2 mg/dL — ABNORMAL LOW (ref 8.9–10.3)
Glucose, Bld: 133 mg/dL — ABNORMAL HIGH (ref 65–99)
Potassium: 3.2 mmol/L — ABNORMAL LOW (ref 3.5–5.1)
Sodium: 138 mmol/L (ref 135–145)

## 2014-08-26 LAB — GLUCOSE, CAPILLARY
GLUCOSE-CAPILLARY: 131 mg/dL — AB (ref 65–99)
Glucose-Capillary: 117 mg/dL — ABNORMAL HIGH (ref 65–99)
Glucose-Capillary: 124 mg/dL — ABNORMAL HIGH (ref 65–99)

## 2014-08-26 LAB — MAGNESIUM: Magnesium: 1.8 mg/dL (ref 1.7–2.4)

## 2014-08-26 MED ORDER — PERFLUTREN LIPID MICROSPHERE
1.0000 mL | INTRAVENOUS | Status: AC | PRN
  Administered 2014-08-26: 2 mL via INTRAVENOUS
  Filled 2014-08-26: qty 10

## 2014-08-26 MED ORDER — POTASSIUM CHLORIDE CRYS ER 20 MEQ PO TBCR
40.0000 meq | EXTENDED_RELEASE_TABLET | Freq: Once | ORAL | Status: AC
  Administered 2014-08-26: 40 meq via ORAL
  Filled 2014-08-26: qty 2

## 2014-08-26 MED ORDER — PERFLUTREN LIPID MICROSPHERE
INTRAVENOUS | Status: AC
  Administered 2014-08-26: 2 mL via INTRAVENOUS
  Filled 2014-08-26: qty 10

## 2014-08-26 NOTE — Progress Notes (Signed)
ANTIBIOTIC CONSULT NOTE  Pharmacy Consult for Rocephin + Azithromycin Indication: rule out sepsis/CAP  Allergies  Allergen Reactions  . Ciprofloxacin Hcl Other (See Comments)    Causes pain in tendons, feels like muscle spasms.    Patient Measurements: Height: 5\' 11"  (180.3 cm) Weight: 233 lb 0.4 oz (105.7 kg) IBW/kg (Calculated) : 75.3  Vital Signs: Temp: 98 F (36.7 C) (07/10 0529) Temp Source: Oral (07/10 0529) BP: 154/81 mmHg (07/10 0529) Pulse Rate: 64 (07/10 0529) Intake/Output from previous day: 07/09 0701 - 07/10 0700 In: 600 [P.O.:600] Out: 401 [Urine:400; Stool:1] Intake/Output from this shift:    Labs:  Recent Labs  08/24/14 0700 08/25/14 0435 08/26/14 0439  WBC 17.6* 12.8* 9.7  HGB 13.6 10.3* 10.3*  PLT 279 239 224  CREATININE 1.43* 1.07 1.01   Estimated Creatinine Clearance: 85.4 mL/min (by C-G formula based on Cr of 1.01). No results for input(s): VANCOTROUGH, VANCOPEAK, VANCORANDOM, GENTTROUGH, GENTPEAK, GENTRANDOM, TOBRATROUGH, TOBRAPEAK, TOBRARND, AMIKACINPEAK, AMIKACINTROU, AMIKACIN in the last 72 hours.   Microbiology: Recent Results (from the past 720 hour(s))  Blood Culture (routine x 2)     Status: None (Preliminary result)   Collection Time: 08/24/14  8:15 AM  Result Value Ref Range Status   Specimen Description BLOOD ARM RIGHT  Final   Special Requests BOTTLES DRAWN AEROBIC AND ANAEROBIC 5CC  Final   Culture NO GROWTH 1 DAY  Final   Report Status PENDING  Incomplete  Blood Culture (routine x 2)     Status: None (Preliminary result)   Collection Time: 08/24/14  8:20 AM  Result Value Ref Range Status   Specimen Description BLOOD RIGHT HAND  Final   Special Requests BOTTLES DRAWN AEROBIC AND ANAEROBIC 5CC  Final   Culture NO GROWTH 1 DAY  Final   Report Status PENDING  Incomplete  Urine culture     Status: None   Collection Time: 08/24/14  8:47 AM  Result Value Ref Range Status   Specimen Description URINE, RANDOM  Final   Special  Requests NONE  Final   Culture NO GROWTH 1 DAY  Final   Report Status 08/25/2014 FINAL  Final  Culture, sputum-assessment     Status: None   Collection Time: 08/24/14  1:57 PM  Result Value Ref Range Status   Specimen Description SPUTUM  Final   Special Requests NONE  Final   Sputum evaluation   Final    MICROSCOPIC FINDINGS SUGGEST THAT THIS SPECIMEN IS NOT REPRESENTATIVE OF LOWER RESPIRATORY SECRETIONS. PLEASE RECOLLECT. Results Called to: A [facility] AT 6270 08/24/14 BY L BENFIELD    Report Status 08/24/2014 FINAL  Final   Assessment: 20 YOM who presented to the [facility] on 7/8 with SOB/fever/cough + AMS. Original concern was for STEMI however this was d/ced and changed to a code SEPSIS due to concern for sepsis related to underlying PNA. Pharmacy consulted to start Rocephin + Azithromycin for empiric coverage.   WBC have fell and are now WNL at 9.7 this morning. Patient was febrile to 103.5 in the ED, but has been afebrile on the floor (noted he has received Norco which contains acetaminophen, so could be contributing. However, patient has only received 5 times in the past 72h, so likely would have seen breakthrough fevers if he was still febrile).  SCr stable at 1, CrCl ~80-84mL/min.  Goal of Therapy:  Proper antibiotics for infection/cultures adjusted for renal/hepatic function   Plan:  -ceftriaxone 2g IV q24h as pts initial presentation concerning for sepsis -Azithromycin  500 mg IV every 24 hours -pharmacy to sign off as no additional dose adjustments are required and patient is improving. Recommend 7 days of therapy with antibiotics for CAP.  Please reconsult if needed.   Callahan Peddie D. Mariano Doshi, PharmD, BCPS Clinical Pharmacist Pager: [phone] 08/26/2014 7:47 AM

## 2014-08-26 NOTE — Progress Notes (Signed)
Subjective:  He is feeling better.  Still has mild pain on the right side which is somewhat pleuritic.  No anginal type pain.  Breathing is improved.  Objective:  Vital Signs in the last 24 hours: BP 154/81 mmHg  Pulse 64  Temp(Src) 98 F (36.7 C) (Oral)  Resp 18  Ht 5\' 11"  (1.803 m)  Wt 105.7 kg (233 lb 0.4 oz)  BMI 32.51 kg/m2  SpO2 98%  Physical Exam: Talkative pleasant male in no acute distress Lungs:  Scattered rhonchi  Cardiac:  Regular rhythm, normal S1 and S2, no S3, 2/6 systolic murmur at base  Abdomen:  Soft, nontender, no masses Extremities:  No edema present, prior venous insufficiency scars noted.  Intake/Output from previous day: 07/09 0701 - 07/10 0700 In: 600 [P.O.:600] Out: 401 [Urine:400; Stool:1]  Weight Filed Weights   08/24/14 0808 08/24/14 1244  Weight: 87.771 kg (193 lb 8 oz) 105.7 kg (233 lb 0.4 oz)    Lab Results: Basic Metabolic Panel:  Recent Labs  08/25/14 0435 08/26/14 0439  NA 138 138  K 3.3* 3.2*  CL 100* 102  CO2 31 30  GLUCOSE 133* 133*  BUN 9 <5*  CREATININE 1.07 1.01   CBC:  Recent Labs  08/24/14 0700 08/25/14 0435 08/26/14 0439  WBC 17.6* 12.8* 9.7  NEUTROABS 14.4*  --   --   HGB 13.6 10.3* 10.3*  HCT 42.1 32.8* 32.8*  MCV 81.7 82.2 84.1  PLT 279 239 224   Cardiac Enzymes: Troponin (Point of Care Test)  Recent Labs  08/24/14 0809  TROPIPOC 0.03   Cardiac Panel (last 3 results)  Recent Labs  08/25/14 1229 08/25/14 1710 08/25/14 2232  TROPONINI 1.01* 1.00* 0.93*    Telemetry: Sinus rhythm  Assessment/Plan:  1.  Atypical chest pain 2.  Abnormal troponin consistent with demand ischemia 3.  Recent sepsis and pneumonia appears to be clinically improving 4.  Coronary artery disease with remote coronary bypass grafting  Recommendations:  Echo is still pending.  He does have an abnormal systolic heart murmur.     Kerry Hough  MD [facility]  08/26/2014, 11:58 AM

## 2014-08-26 NOTE — Progress Notes (Signed)
PROGRESS NOTE  [name] [name] WUJ:[id number] DOB: [birth year] DOA: 08/24/2014 PCP: Tommy Medal, MD  brief history 69 year old male with a history of CAD an MI in 2001, hypertension, hyperlipidemia diabetes mellitus presented with 3 day history of fevers, chills, shortness of breath, and confusion. Because of his shortness of breath, the patient took 2 days' worth of furosemide on 08/22/2014 on 08/23/2014. The patient states that he has been weighing himself regularly, and denies any recent increase in his weight. The patient denies any headache, chest discomfort, nausea, vomiting, diarrhea, dizziness, syncope. Denies any Abdominal pain, dysuria, hematuria. Workup in the emergency department revealed WBC 17.8 with serum creatinine 1.43 and chest x-ray suggestive of right lower lobe infiltrate. Blood cultures were obtained and the patient was started on intravenous antibiotics  Assessment/Plan: sepsis - secondary to Pneumonia - sepsis physiology has improved - continue antibiotics pending culture data Community acquired pneumonia - continue ceftriaxone and azithromycin -wean oxygen for saturation >92% -blood cultures remain negative Elevated troponin -likely demand ischemia -appreciate cardiology consult -await echo resulta Acute encephalopathy - secondary to sepsis and acute on chronic renal failure - patient is back at baseline according to the family on 08/25/2014 Acute on chronic renal failure (CKD 2-3) - improved with IV fluids - Secondary to volume depletion - Baseline creatinine 1.0-1.3 Alcohol dependence - patient drinks 2-4 beers daily - alcohol withdraw protocol Diabetes mellitus type 2 - 08/24/2014 hemoglobin A1c 6.9 - hold metformin - continue NovoLog sliding scale Coronary artery disease with history of MI - EKG abnormal with downsloping of ST segments - continue aspirin - continue metoprolol succinate Hypertension - continue metoprolol  succinate - hold ARB and Dyazide secondary to acute renal failure Hypokalemia - replete hypomagnesemia - replete nonsustained ventricular tachycardia - remain on telemetry - patient is asymptomatic - maintain potassium greater than 4 and magnesium greater than 2  Family Communication: Wife updated at beside Disposition Plan: Home 1-2 days   Procedures/Studies: Dg Chest 2 View  08/24/2014   CLINICAL DATA:  There is no active cardiopulmonary disease.  EXAM: CHEST  2 VIEW  COMPARISON:  Chest x-ray and chest CT scan of December 04, 2013  FINDINGS: There is chronic elevation of the left hemidiaphragm. There is minimal atelectasis adjacent to the posterior aspect of the left hemidiaphragm. The right lung is clear. The cardiac silhouette is top-normal in size. The pulmonary vascularity is normal. The 3 upper sternal wires and the second from the top sternal band are broken. These are stable findings. No acute bony abnormality is observed.  IMPRESSION: There is subsegmental atelectasis posteriorly and inferiorly in the left lower lobe. Otherwise the examination is unchanged from the previous study.   Electronically Signed   By: Jaray  Martinique M.D.   On: 08/24/2014 08:38         Subjective:  Patient denies fevers, chills, headache, chest pain, dyspnea, nausea, vomiting, diarrhea, abdominal pain, dysuria, hematuria  Objective: Filed Vitals:   08/25/14 1419 08/25/14 2209 08/26/14 0529 08/26/14 1421  BP: 142/82 154/81 154/81 155/80  Pulse: 72 65 64 65  Temp: 98.6 F (37 C) 97.9 F (36.6 C) 98 F (36.7 C) 98.5 F (36.9 C)  TempSrc:  Oral Oral Oral  Resp: 18 20 18 18   Height:      Weight:      SpO2: 100% 96% 98% 99%    Intake/Output Summary (Last 24 hours) at 08/26/14 1425 Last data filed at 08/26/14 1425  Gross  per 24 hour  Intake    600 ml  Output    700 ml  Net   -100 ml   Weight change:  Exam:   General:  Pt is alert, follows commands appropriately, not in acute  distress  HEENT: No icterus, No thrush, No neck mass, [place]/AT  Cardiovascular: RRR, S1/S2, no rubs, no gallops  Respiratory: Bibasilar crackles.; Left clear to auscultation.  Abdomen: Soft/+BS, non tender, non distended, no guarding  Extremities: No edema, No lymphangitis, No petechiae, No rashes, no synovitis  Data Reviewed: Basic Metabolic Panel:  Recent Labs Lab 08/24/14 0700 08/25/14 0435 08/26/14 0439  NA 135 138 138  K 2.7* 3.3* 3.2*  CL 92* 100* 102  CO2 27 31 30   GLUCOSE 187* 133* 133*  BUN 13 9 <5*  CREATININE 1.43* 1.07 1.01  CALCIUM 9.3 8.0* 8.2*  MG  --  1.5* 1.8   Liver Function Tests:  Recent Labs Lab 08/24/14 0700 08/25/14 0435  AST 37 24  ALT 25 19  ALKPHOS 78 50  BILITOT 0.6 0.5  PROT 7.1 5.5*  ALBUMIN 4.1 3.0*   No results for input(s): LIPASE, AMYLASE in the last 168 hours.  Recent Labs Lab 08/24/14 0937  AMMONIA 20   CBC:  Recent Labs Lab 08/24/14 0700 08/25/14 0435 08/26/14 0439  WBC 17.6* 12.8* 9.7  NEUTROABS 14.4*  --   --   HGB 13.6 10.3* 10.3*  HCT 42.1 32.8* 32.8*  MCV 81.7 82.2 84.1  PLT 279 239 224   Cardiac Enzymes:  Recent Labs Lab 08/25/14 1229 08/25/14 1710 08/25/14 2232  TROPONINI 1.01* 1.00* 0.93*   BNP: Invalid input(s): POCBNP CBG:  Recent Labs Lab 08/25/14 1137 08/25/14 1647 08/25/14 2206 08/26/14 0731 08/26/14 1220  GLUCAP 148* 136* 115* 117* 131*    Recent Results (from the past 240 hour(s))  Blood Culture (routine x 2)     Status: None (Preliminary result)   Collection Time: 08/24/14  8:15 AM  Result Value Ref Range Status   Specimen Description BLOOD ARM RIGHT  Final   Special Requests BOTTLES DRAWN AEROBIC AND ANAEROBIC 5CC  Final   Culture NO GROWTH 1 DAY  Final   Report Status PENDING  Incomplete  Blood Culture (routine x 2)     Status: None (Preliminary result)   Collection Time: 08/24/14  8:20 AM  Result Value Ref Range Status   Specimen Description BLOOD RIGHT HAND  Final    Special Requests BOTTLES DRAWN AEROBIC AND ANAEROBIC 5CC  Final   Culture NO GROWTH 1 DAY  Final   Report Status PENDING  Incomplete  Urine culture     Status: None   Collection Time: 08/24/14  8:47 AM  Result Value Ref Range Status   Specimen Description URINE, RANDOM  Final   Special Requests NONE  Final   Culture NO GROWTH 1 DAY  Final   Report Status 08/25/2014 FINAL  Final  Culture, sputum-assessment     Status: None   Collection Time: 08/24/14  1:57 PM  Result Value Ref Range Status   Specimen Description SPUTUM  Final   Special Requests NONE  Final   Sputum evaluation   Final    MICROSCOPIC FINDINGS SUGGEST THAT THIS SPECIMEN IS NOT REPRESENTATIVE OF LOWER RESPIRATORY SECRETIONS. PLEASE RECOLLECT. Results Called to: A [facility] AT 2423 08/24/14 BY L BENFIELD    Report Status 08/24/2014 FINAL  Final     Scheduled Meds: . acidophilus  1 capsule Oral Daily  .  allopurinol  300 mg Oral QPM  . antiseptic oral rinse  7 mL Mouth Rinse BID  . aspirin EC  325 mg Oral Q breakfast  . azithromycin  500 mg Intravenous Q24H  . calcium-vitamin D  1 tablet Oral Q breakfast  . cefTRIAXone (ROCEPHIN)  IV  2 g Intravenous Q24H  . clonazePAM  1 mg Oral BID  . docusate sodium  300 mg Oral QHS  . enoxaparin (LOVENOX) injection  40 mg Subcutaneous Q24H  . folic acid  1 mg Oral Daily  . gabapentin  300 mg Oral QHS  . hydrocortisone cream   Topical BID  . insulin aspart  0-9 Units Subcutaneous TID WC  . metoprolol succinate  100 mg Oral Daily  . multivitamin with minerals  1 tablet Oral Daily  . pantoprazole  40 mg Oral Daily  . thiamine  100 mg Oral Daily  . venlafaxine XR  150 mg Oral QPM   Continuous Infusions:    Nathaniel Wakeley, [place], DO  [facility] Pager [phone]  If 7PM-7AM, please contact night-coverage [email or web address] Password [email or web address] 08/26/2014, 2:25 PM   LOS: 2 days

## 2014-08-26 NOTE — Progress Notes (Signed)
Echocardiogram 2D Echocardiogram has been performed.  [name] [name] 08/26/2014, 4:29 PM

## 2014-08-27 LAB — BASIC METABOLIC PANEL
Anion gap: 8 (ref 5–15)
BUN: <5 mg/dL — ABNORMAL LOW (ref 6–20)
CO2: 26 mmol/L (ref 22–32)
Calcium: 8 mg/dL — ABNORMAL LOW (ref 8.9–10.3)
Chloride: 103 mmol/L (ref 101–111)
Creatinine, Ser: 0.98 mg/dL (ref 0.61–1.24)
GFR calc Af Amer: >60 mL/min (ref >60)
GFR calc non Af Amer: >60 mL/min (ref >60)
GLUCOSE: 161 mg/dL — AB (ref 65–99)
Potassium: 3 mmol/L — ABNORMAL LOW (ref 3.5–5.1)
Sodium: 137 mmol/L (ref 135–145)

## 2014-08-27 LAB — MAGNESIUM: Magnesium: 1.6 mg/dL — ABNORMAL LOW (ref 1.7–2.4)

## 2014-08-27 LAB — GLUCOSE, CAPILLARY
Glucose-Capillary: 122 mg/dL — ABNORMAL HIGH (ref 65–99)
Glucose-Capillary: 138 mg/dL — ABNORMAL HIGH (ref 65–99)

## 2014-08-27 LAB — LEGIONELLA ANTIGEN, URINE

## 2014-08-27 LAB — CBC
HEMATOCRIT: 33.9 % — AB (ref 39.0–52.0)
Hemoglobin: 10.7 g/dL — ABNORMAL LOW (ref 13.0–17.0)
MCH: 26 pg (ref 26.0–34.0)
MCHC: 31.6 g/dL (ref 30.0–36.0)
MCV: 82.3 fL (ref 78.0–100.0)
Platelets: 246 10*3/uL (ref 150–400)
RBC: 4.12 MIL/uL — ABNORMAL LOW (ref 4.22–5.81)
RDW: 16.6 % — ABNORMAL HIGH (ref 11.5–15.5)
WBC: 10.4 10*3/uL (ref 4.0–10.5)

## 2014-08-27 MED ORDER — MAGNESIUM SULFATE 2 GM/50ML IV SOLN
2.0000 g | Freq: Once | INTRAVENOUS | Status: AC
  Administered 2014-08-27: 2 g via INTRAVENOUS
  Filled 2014-08-27: qty 50

## 2014-08-27 MED ORDER — CEFDINIR 300 MG PO CAPS: 300.0000 mg | ORAL_CAPSULE | Freq: Two times a day (BID) | ORAL | Status: DC

## 2014-08-27 MED ORDER — AZITHROMYCIN 500 MG PO TABS: 500.0000 mg | ORAL_TABLET | Freq: Every day | ORAL | Status: DC

## 2014-08-27 MED ORDER — POTASSIUM CHLORIDE CRYS ER 20 MEQ PO TBCR
40.0000 meq | EXTENDED_RELEASE_TABLET | ORAL | Status: AC
Start: 2014-08-27 — End: 2014-08-27
  Administered 2014-08-27 (×2): 40 meq via ORAL
  Filled 2014-08-27 (×2): qty 2

## 2014-08-27 NOTE — Discharge Summary (Signed)
Physician Discharge Summary  [name] [name] [id number]:[id number] DOB: 1945/02/28 DOA: 08/24/2014  PCP: Merrilee Seashore, MD  Admit date: 08/24/2014 Discharge date: 08/27/2014  Recommendations for Outpatient Follow-up:  1. Pt will need to follow up with PCP in 1 week post discharge 2. Please obtain BMP to evaluate electrolytes and kidney function  Discharge Diagnoses:  sepsis - secondary to Pneumonia - sepsis physiology has improved - blood cultures x 2 neg Community acquired pneumonia - continued ceftriaxone and azithromycin - home with azithromycin 500mg , one more dose on 08/28/14 to complete 5 days - omnicef 300mg  bid x 3 days to complete 7 days of tx -wean oxygen for saturation >92% -blood cultures remain negative Elevated troponin -likely demand ischemia -appreciate cardiology consult-->outpt followup -echo--EF 53-66%, grade 2 diastolic dysfx, no WMA Acute encephalopathy - secondary to sepsis and acute on chronic renal failure - patient is back at baseline according to the family on 08/25/2014 Acute on chronic renal failure (CKD 2-3) - improved with IV fluids - Secondary to volume depletion - Baseline creatinine 1.0-1.3 -Serum creatinine 0.9 and on the day of discharge Alcohol dependence - patient drinks 2-4 beers daily - alcohol withdraw protocol--no signs of withdraw Diabetes mellitus type 2 - 08/24/2014 hemoglobin A1c 6.9 - hold metformin--restart after d/c - continue NovoLog sliding scale Coronary artery disease with history of MI - EKG abnormal with downsloping of ST segments - continue aspirin - continue metoprolol succinate - restart valsartan Hypertension - continue metoprolol succinate - hdld ARB and Dyazide secondary to acute renal failure - restart valsartan - hold Dyazide until follow up with PCP Hypokalemia - repleted hypomagnesemia - repleted nonsustained ventricular tachycardia - remain on telemetry - patient is asymptomatic - maintain  potassium greater than 4 and magnesium greater than 2  Discharge Condition: stable  Disposition:  Follow-up Information    Follow up with [facility], MD On 09/18/2014.   Specialty:  Internal Medicine   Why:  Appointment with Dr. Ashby Dawes 09/18/14 ar 11:30am   Contact information:   [place] [address] [phone]       Follow up with Minus Breeding, MD In 2 weeks.   Specialty:  Cardiology   Contact information:   [address] [place] [address] [phone]     home  Diet:heart healthy Wt Readings from Last 3 Encounters:  08/24/14 105.7 kg (233 lb 0.4 oz)  01/01/14 104.327 kg (230 lb)  03/01/13 106 kg (233 lb 11 oz)    History of present illness:  68 year old male with a history of CAD an MI in 2001, hypertension, hyperlipidemia diabetes mellitus presented with 3 day history of fevers, chills, shortness of breath, and confusion. Because of his shortness of breath, the patient took 2 days' worth of furosemide on 08/22/2014 on 08/23/2014. The patient states that he has been weighing himself regularly, and denies any recent increase in his weight. The patient denies any headache, chest discomfort, nausea, vomiting, diarrhea, dizziness, syncope. Denies any Abdominal pain, dysuria, hematuria. Workup in the emergency department revealed WBC 17.8 with serum creatinine 1.43 and chest x-ray suggestive of right lower lobe infiltrate. Blood cultures were obtained and the patient was started on intravenous antibiotics.  Blood cultures remained negative. The patient will be switched to oral azithromycin for 1 additional days to complete 5 days of therapy. He will also go home with Omnicef 300 mg twice a day for 3 additional days which will complete 7 days of therapy. The patient was seen by cardiology. They  did not feel the patient needed any further and patient testing. Echocardiogram was unremarkable. The patient will need outpatient  cardiology follow-up. He previously saw Dr. Percival Spanish.  Consultants: cardiology  Discharge Exam: Filed Vitals:   08/27/14 0552  BP: 175/83  Pulse: 72  Temp:   Resp:    Filed Vitals:   08/26/14 1421 08/26/14 2122 08/27/14 0527 08/27/14 0552  BP: 155/80 178/80 172/77 175/83  Pulse: 65 67 76 72  Temp: 98.5 F (36.9 C) 99.4 F (37.4 C) 98.5 F (36.9 C)   TempSrc: Oral Oral Oral   Resp: 18 18 18    Height:      Weight:      SpO2: 99% 96% 97%    General: A&O x 3, NAD, pleasant, cooperative Cardiovascular: RRR, no rub, no gallop, no S3 Respiratory:  left basilar crackles. No wheezing. Good air movement.  Abdomen:soft, nontender, nondistended, positive bowel sounds Extremities: No edema, No lymphangitis, no petechiae  Discharge Instructions      Discharge Instructions    Diet - low sodium heart healthy    Complete by:  As directed      Discharge instructions    Complete by:  As directed   Start azithromycin and cefdinir on 08/28/14 Do not restart triamterene/HCTZ until you follow up with your family doctor     Increase activity slowly    Complete by:  As directed             Medication List    STOP taking these medications        triamterene-hydrochlorothiazide 37.5-25 MG per capsule  Commonly known as:  DYAZIDE      TAKE these medications        allopurinol 300 MG tablet  Commonly known as:  ZYLOPRIM  Take 300 mg by mouth every evening.     aspirin EC 325 MG tablet  Take 1 tablet (325 mg total) by mouth 2 (two) times daily after a meal. Take x 1 month post op.     azithromycin 500 MG tablet  Commonly known as:  ZITHROMAX  Take 1 tablet (500 mg total) by mouth daily. Take on 08/28/14  Start taking on:  08/28/2014     B-complex with vitamin C tablet  Take 1 tablet by mouth daily with breakfast.     calcium-vitamin D 500-200 MG-UNIT per tablet  Commonly known as:  OSCAL WITH D  Take 1 tablet by mouth daily with breakfast.     cefdinir 300 MG capsule    Commonly known as:  OMNICEF  Take 1 capsule (300 mg total) by mouth 2 (two) times daily. Start 08/28/14  Start taking on:  08/28/2014     cholecalciferol 1000 UNITS tablet  Commonly known as:  VITAMIN D  Take 2,000 Units by mouth daily.     clonazePAM 1 MG tablet  Commonly known as:  KLONOPIN  Take 1 mg by mouth 2 (two) times daily.     DSS 100 MG Caps  Take 100 mg by mouth 2 (two) times daily as needed for mild constipation.     furosemide 20 MG tablet  Commonly known as:  LASIX  Take 1 tablet (20 mg total) by mouth as needed.     gabapentin 300 MG capsule  Commonly known as:  NEURONTIN  Take 300 mg by mouth at bedtime.     HYDROcodone-acetaminophen 10-325 MG per tablet  Commonly known as:  NORCO  Take 1 tablet by mouth every 6 (six) hours as  needed for moderate pain.     MAGNESIUM OXIDE PO  Take 500 mg by mouth daily with breakfast.     metFORMIN 500 MG (MOD) 24 hr tablet  Commonly known as:  GLUMETZA  Take 500 mg by mouth 2 (two) times daily with a meal.     metoprolol succinate 100 MG 24 hr tablet  Commonly known as:  TOPROL-XL  Take 1 tablet (100 mg total) by mouth daily.     nitroGLYCERIN 0.4 MG SL tablet  Commonly known as:  NITROSTAT  Place 1 tablet (0.4 mg total) under the tongue every 5 (five) minutes as needed.     PROBIOTIC DAILY PO  Take 1 capsule by mouth at bedtime.     valsartan 80 MG tablet  Commonly known as:  DIOVAN  Take 80 mg by mouth daily with breakfast.     venlafaxine XR 150 MG 24 hr capsule  Commonly known as:  EFFEXOR-XR  Take 150 mg by mouth every evening.         The results of significant diagnostics from this hospitalization (including imaging, microbiology, ancillary and laboratory) are listed below for reference.    Significant Diagnostic Studies: Dg Chest 2 View  08/24/2014   CLINICAL DATA:  There is no active cardiopulmonary disease.  EXAM: CHEST  2 VIEW  COMPARISON:  Chest x-ray and chest CT scan of December 04, 2013   FINDINGS: There is chronic elevation of the left hemidiaphragm. There is minimal atelectasis adjacent to the posterior aspect of the left hemidiaphragm. The right lung is clear. The cardiac silhouette is top-normal in size. The pulmonary vascularity is normal. The 3 upper sternal wires and the second from the top sternal band are broken. These are stable findings. No acute bony abnormality is observed.  IMPRESSION: There is subsegmental atelectasis posteriorly and inferiorly in the left lower lobe. Otherwise the examination is unchanged from the previous study.   Electronically Signed   By: Keishaun  Martinique M.D.   On: 08/24/2014 08:38     Microbiology: Recent Results (from the past 240 hour(s))  Blood Culture (routine x 2)     Status: None (Preliminary result)   Collection Time: 08/24/14  8:15 AM  Result Value Ref Range Status   Specimen Description BLOOD ARM RIGHT  Final   Special Requests BOTTLES DRAWN AEROBIC AND ANAEROBIC 5CC  Final   Culture NO GROWTH 2 DAYS  Final   Report Status PENDING  Incomplete  Blood Culture (routine x 2)     Status: None (Preliminary result)   Collection Time: 08/24/14  8:20 AM  Result Value Ref Range Status   Specimen Description BLOOD RIGHT HAND  Final   Special Requests BOTTLES DRAWN AEROBIC AND ANAEROBIC 5CC  Final   Culture NO GROWTH 2 DAYS  Final   Report Status PENDING  Incomplete  Urine culture     Status: None   Collection Time: 08/24/14  8:47 AM  Result Value Ref Range Status   Specimen Description URINE, RANDOM  Final   Special Requests NONE  Final   Culture NO GROWTH 1 DAY  Final   Report Status 08/25/2014 FINAL  Final  Culture, sputum-assessment     Status: None   Collection Time: 08/24/14  1:57 PM  Result Value Ref Range Status   Specimen Description SPUTUM  Final   Special Requests NONE  Final   Sputum evaluation   Final    MICROSCOPIC FINDINGS SUGGEST THAT THIS SPECIMEN IS NOT REPRESENTATIVE OF LOWER  RESPIRATORY SECRETIONS. PLEASE  RECOLLECT. Results Called to: A [facility] AT 8299 08/24/14 BY L BENFIELD    Report Status 08/24/2014 FINAL  Final     Labs: Basic Metabolic Panel:  Recent Labs Lab 08/24/14 0700 08/25/14 0435 08/26/14 0439 08/27/14 0500  NA 135 138 138 137  K 2.7* 3.3* 3.2* 3.0*  CL 92* 100* 102 103  CO2 27 31 30 26   GLUCOSE 187* 133* 133* 161*  BUN 13 9 <5* <5*  CREATININE 1.43* 1.07 1.01 0.98  CALCIUM 9.3 8.0* 8.2* 8.0*  MG  --  1.5* 1.8 1.6*   Liver Function Tests:  Recent Labs Lab 08/24/14 0700 08/25/14 0435  AST 37 24  ALT 25 19  ALKPHOS 78 50  BILITOT 0.6 0.5  PROT 7.1 5.5*  ALBUMIN 4.1 3.0*   No results for input(s): LIPASE, AMYLASE in the last 168 hours.  Recent Labs Lab 08/24/14 0937  AMMONIA 20   CBC:  Recent Labs Lab 08/24/14 0700 08/25/14 0435 08/26/14 0439 08/27/14 0500  WBC 17.6* 12.8* 9.7 10.4  NEUTROABS 14.4*  --   --   --   HGB 13.6 10.3* 10.3* 10.7*  HCT 42.1 32.8* 32.8* 33.9*  MCV 81.7 82.2 84.1 82.3  PLT 279 239 224 246   Cardiac Enzymes:  Recent Labs Lab 08/25/14 1229 08/25/14 1710 08/25/14 2232  TROPONINI 1.01* 1.00* 0.93*   BNP: Invalid input(s): POCBNP CBG:  Recent Labs Lab 08/25/14 2206 08/26/14 0731 08/26/14 1220 08/26/14 1647 08/27/14 0810  GLUCAP 115* 117* 131* 124* 122*    Time coordinating discharge:  Greater than 30 minutes  Signed:  Niasia Lanphear, [place], DO [facility] Pager: [phone] 08/27/2014, 10:02 AM

## 2014-08-27 NOTE — Care Management Note (Signed)
Case Management Note  Patient Details  Name: [name] [name] MRN: [id number] Date of Birth: 1945/03/14  Subjective/Objective:                 Discharge to home with spouse. No other needs at this time   Action/Plan:   Expected Discharge Date:                  Expected Discharge Plan:  Home/Self Care  In-House Referral:  Clinical Social Work  Discharge planning Services  CM Consult  Post Acute Care Choice:  NA Choice offered to:     DME Arranged:    DME Agency:     HH Arranged:    Liborio Negron Torres Agency:     Status of Service:  Completed, signed off  Medicare Important Message Given:    Date Medicare IM Given:    Medicare IM give by:    Date Additional Medicare IM Given:    Additional Medicare Important Message give by:     If discussed at [place] of Stay Meetings, dates discussed:    Additional Comments:  Carles Collet, RN 08/27/2014, 11:16 AM

## 2014-08-27 NOTE — Care Management (Signed)
Important Message  Patient Details  Name: [name] [name] MRN: [id number] Date of Birth: Jun 15, 1945   Medicare Important Message Given:  Yes-second notification given    Delorse Lek 08/27/2014, 3:34 PM

## 2014-08-27 NOTE — Progress Notes (Signed)
NURSING PROGRESS NOTE  [name] [name] [id number] Discharge Data: 08/27/2014 2:07 PM Attending Provider: Orson Eva, MD BMW:[id number],ZDGUY, MD   [name] to be D/C'd Home per MD order via wheelchair with volunteer services and wife.    All IV's will be discontinued and monitored for bleeding.  All belongings will be returned to patient for patient to take home.  Last Documented Vital Signs:  Blood pressure 144/86, pulse 78, temperature 97.8 F (36.6 C), temperature source Oral, resp. rate 20, height 5\' 11"  (1.803 m), weight 105.7 kg (233 lb 0.4 oz), SpO2 97 %.  Hendricks Limes RN, BS, BSN

## 2014-08-27 NOTE — Evaluation (Signed)
Physical Therapy Evaluation and Discharge  Patient Details  Name: [name] [name] MRN: [id number] DOB: October 06, 1945 Today's Date: 08/27/2014   History of Present Illness  Pt adm 08/24/14 due to sepsis secondary from PNA and acute encephalopathy. PMH includes of CAD an MI in 2001, hypertension, hyperlipidemia diabetes mellitus .   Clinical Impression  Patient evaluated by Physical Therapy with no further acute PT needs identified. All education has been completed and the patient has no further questions. See below for any follow-up Physial Therapy or equipment needs. PT is signing off. Thank you for this referral.    Follow Up Recommendations Home health PT;Supervision/Assistance - 24 hour (pt has used [name] and would like to continue)     Equipment Recommendations  None recommended by PT    Recommendations for Other Services       Precautions / Restrictions Precautions Precautions: None Restrictions Weight Bearing Restrictions: No      Mobility  Bed Mobility Overal bed mobility: Independent                Transfers Overall transfer level: Independent Equipment used: Rolling walker (2 wheeled)             General transfer comment: no noted LOB with sit <> stand  Ambulation/Gait Ambulation/Gait assistance: Supervision Ambulation Distance (Feet): 300 Feet Assistive device: Rolling walker (2 wheeled) Gait Pattern/deviations: Step-through pattern;Decreased stride length Gait velocity: decr Gait velocity interpretation: Below normal speed for age/gender General Gait Details: RW to steady his gait; supervision for safety; no bouts of balance loss   Stairs Stairs:  (reviewed technique; pt denied need to practice )          Wheelchair Mobility    Modified Rankin (Stroke Patients Only)       Balance Overall balance assessment: Needs assistance Sitting-balance support: Feet supported;No upper extremity supported Sitting balance-Leahy Scale:  Normal     Standing balance support: During functional activity;Bilateral upper extremity supported Standing balance-Leahy Scale: Poor Standing balance comment: RW for UE support                             Pertinent Vitals/Pain Pain Assessment: No/denies pain    Home Living Family/patient expects to be discharged to:: Private residence Living Arrangements: Spouse/significant other Available Help at Discharge: Family;Available 24 hours/day Type of Home: House Home Access: Stairs to enter Entrance Stairs-Rails: Can reach both;Left;Right Entrance Stairs-Number of Steps: 3 Home Layout: One level Home Equipment: Walker - 2 wheels;Cane - single point      Prior Function Level of Independence: Independent with assistive device(s)         Comments: ambulates with cane PRN      Hand Dominance   Dominant Hand: Left    Extremity/Trunk Assessment   Upper Extremity Assessment: Defer to OT evaluation           Lower Extremity Assessment: Generalized weakness      Cervical / Trunk Assessment: Normal  Communication   Communication: No difficulties  Cognition Arousal/Alertness: Awake/alert Behavior During Therapy: WFL for tasks assessed/performed Overall Cognitive Status: Within Functional Limits for tasks assessed                      General Comments      Exercises        Assessment/Plan    PT Assessment Patent does not need any further PT services;All further PT needs can be met in  the next venue of care  PT Diagnosis Generalized weakness;Difficulty walking   PT Problem List Decreased activity tolerance;Decreased balance;Decreased strength;Decreased knowledge of use of DME  PT Treatment Interventions     PT Goals (Current goals can be found in the Care Plan section) Acute Rehab PT Goals Patient Stated Goal: home today PT Goal Formulation: All assessment and education complete, DC therapy    Frequency     Barriers to discharge         Co-evaluation               End of Session Equipment Utilized During Treatment: Gait belt Activity Tolerance: Patient tolerated treatment well Patient left: in bed;with call bell/phone within reach Nurse Communication: Mobility status         Time: 2449-7530 PT Time Calculation (min) (ACUTE ONLY): 14 min   Charges:   PT Evaluation $Initial PT Evaluation Tier I: 1 Procedure     PT G Codes:        Adajah Cocking, [place] N PT 08/27/2014, 10:26 AM

## 2014-08-27 NOTE — Progress Notes (Signed)
Utilization Review completed. Krystal Delduca RN BSN CM 

## 2014-08-27 NOTE — Progress Notes (Signed)
PROGRESS NOTE  Subjective:    [name] [name] is a 69 y.o. male has a past medical history significant for coronary artery disease, hypertension, hyperlipidemia, anal fistula, presents to the emergency room with a chief complaint of cough and shortness of breath as well as more confusion per family. Patient, over the last 3 days, has been complaining of fever and chills, as well as a cough that has been progressively getting worse  troponins were mildly elevated in the setting of sepsis.    Echo shows normal LV function: Left ventricle: The cavity size was normal. Wall thickness was normal. Systolic function was normal. The estimated ejection fraction was in the range of 55% to 60%. Wall motion was normal; there were no regional wall motion abnormalities. Features are consistent with a pseudonormal left ventricular filling pattern, with concomitant abnormal relaxation and increased filling pressure (grade 2 diastolic dysfunction). - Aortic valve: There was mild regurgitation. - Left atrium: The atrium was mildly dilated.  Objective:    Vital Signs:   Temp:  [98.5 F (36.9 C)-99.4 F (37.4 C)] 98.5 F (36.9 C) (07/11 0527) Pulse Rate:  [65-76] 72 (07/11 0552) Resp:  [18] 18 (07/11 0527) BP: (155-178)/(77-83) 175/83 mmHg (07/11 0552) SpO2:  [96 %-99 %] 97 % (07/11 0527)  Last BM Date:  (pta)   24-hour weight change: Weight change:   Weight trends: Filed Weights   08/24/14 0808 08/24/14 1244  Weight: 87.771 kg (193 lb 8 oz) 105.7 kg (233 lb 0.4 oz)    Intake/Output:  07/10 0701 - 07/11 0700 In: 700 [P.O.:400; IV Piggyback:300] Out: 500 [Urine:500] Total I/O In: 240 [P.O.:240] Out: -    Physical Exam: BP 175/83 mmHg  Pulse 72  Temp(Src) 98.5 F (36.9 C) (Oral)  Resp 18  Ht 5\' 11"  (1.803 m)  Wt 105.7 kg (233 lb 0.4 oz)  BMI 32.51 kg/m2  SpO2 97%  Wt Readings from Last 3 Encounters:  08/24/14 105.7 kg (233 lb 0.4 oz)  01/01/14 104.327 kg  (230 lb)  03/01/13 106 kg (233 lb 11 oz)    General: Vital signs reviewed and noted.   Head: Normocephalic, atraumatic.  Eyes: conjunctivae/corneas clear.  EOM's intact.   Throat: normal  Neck:  normal   Lungs:    clear   Heart:  RR   Abdomen:  Soft, non-tender, non-distended    Extremities: No edema, no clubbing    Neurologic: A&O X3, CN II - XII are grossly intact.   Psych: Normal     Labs: BMET:  Recent Labs  08/26/14 0439 08/27/14 0500  NA 138 137  K 3.2* 3.0*  CL 102 103  CO2 30 26  GLUCOSE 133* 161*  BUN <5* <5*  CREATININE 1.01 0.98  CALCIUM 8.2* 8.0*  MG 1.8 1.6*    Liver function tests:  Recent Labs  08/25/14 0435  AST 24  ALT 19  ALKPHOS 50  BILITOT 0.5  PROT 5.5*  ALBUMIN 3.0*   No results for input(s): LIPASE, AMYLASE in the last 72 hours.  CBC:  Recent Labs  08/26/14 0439 08/27/14 0500  WBC 9.7 10.4  HGB 10.3* 10.7*  HCT 32.8* 33.9*  MCV 84.1 82.3  PLT 224 246    Cardiac Enzymes:  Recent Labs  08/25/14 1229 08/25/14 1710 08/25/14 2232  TROPONINI 1.01* 1.00* 0.93*    Coagulation Studies: No results for input(s): LABPROT, INR in the last 72 hours.  Other: Invalid input(s): POCBNP No results for  input(s): DDIMER in the last 72 hours. No results for input(s): HGBA1C in the last 72 hours. No results for input(s): CHOL, HDL, LDLCALC, TRIG, CHOLHDL in the last 72 hours. No results for input(s): TSH, T4TOTAL, T3FREE, THYROIDAB in the last 72 hours.  Invalid input(s): FREET3 No results for input(s): VITAMINB12, FOLATE, FERRITIN, TIBC, IRON, RETICCTPCT in the last 72 hours.   Other results:  EKG   Medications:    Infusions:    Scheduled Medications: . acidophilus  1 capsule Oral Daily  . allopurinol  300 mg Oral QPM  . antiseptic oral rinse  7 mL Mouth Rinse BID  . aspirin EC  325 mg Oral Q breakfast  . azithromycin  500 mg Intravenous Q24H  . calcium-vitamin D  1 tablet Oral Q breakfast  . cefTRIAXone  (ROCEPHIN)  IV  2 g Intravenous Q24H  . clonazePAM  1 mg Oral BID  . docusate sodium  300 mg Oral QHS  . enoxaparin (LOVENOX) injection  40 mg Subcutaneous Q24H  . folic acid  1 mg Oral Daily  . gabapentin  300 mg Oral QHS  . hydrocortisone cream   Topical BID  . insulin aspart  0-9 Units Subcutaneous TID WC  . metoprolol succinate  100 mg Oral Daily  . multivitamin with minerals  1 tablet Oral Daily  . pantoprazole  40 mg Oral Daily  . potassium chloride  40 mEq Oral Q4H  . thiamine  100 mg Oral Daily  . venlafaxine XR  150 mg Oral QPM    Assessment/ Plan:   Principal Problem:   Sepsis Active Problems:   Hypertension   CAD (coronary artery disease)   Hyperlipidemia   Fistula-in-ano   DM (diabetes mellitus), type 2   CAP (community acquired pneumonia)   Hypokalemia   AKI (acute kidney injury)   Alcohol abuse   Acute encephalopathy   Acute on chronic renal failure   Blood poisoning   Elevated troponin   1. Elevated troponin :  Due to sepsis. Echo shows no segmental wall motion abn. Stable for DC.  Follow up with Dr. Percival Spanish.   Disposition: DC to home   Length of Stay: 3  Thayer Headings, Brooke Bonito., MD, [facility] 08/27/2014, 12:49 PM Office [phone] Pager [phone]

## 2014-08-29 LAB — CULTURE, BLOOD (ROUTINE X 2)
CULTURE: NO GROWTH
CULTURE: NO GROWTH

## 2014-10-09 ENCOUNTER — Ambulatory Visit: Payer: Medicare Other | Admitting: Cardiology

## 2014-12-12 ENCOUNTER — Other Ambulatory Visit: Payer: Self-pay | Admitting: Internal Medicine

## 2014-12-12 DIAGNOSIS — E041 Nontoxic single thyroid nodule: Secondary | ICD-10-CM

## 2014-12-24 ENCOUNTER — Ambulatory Visit
Admission: RE | Admit: 2014-12-24 | Discharge: 2014-12-24 | Disposition: A | Discharge status: Home / Self Care | Payer: Medicare Other | Source: Ambulatory Visit | Attending: Internal Medicine | Admitting: Internal Medicine

## 2014-12-24 DIAGNOSIS — E041 Nontoxic single thyroid nodule: Secondary | ICD-10-CM

## 2015-03-07 ENCOUNTER — Other Ambulatory Visit: Payer: Self-pay | Admitting: Anesthesiology

## 2015-03-07 DIAGNOSIS — M546 Pain in thoracic spine: Secondary | ICD-10-CM

## 2015-03-13 ENCOUNTER — Ambulatory Visit
Admission: RE | Admit: 2015-03-13 | Discharge: 2015-03-13 | Disposition: A | Discharge status: Home / Self Care | Payer: Medicare Other | Source: Ambulatory Visit | Attending: Anesthesiology | Admitting: Anesthesiology

## 2015-03-13 DIAGNOSIS — M546 Pain in thoracic spine: Secondary | ICD-10-CM

## 2015-04-25 ENCOUNTER — Emergency Department (HOSPITAL_COMMUNITY): Payer: Medicare Other

## 2015-04-25 ENCOUNTER — Inpatient Hospital Stay (HOSPITAL_COMMUNITY)
Admission: EM | Admit: 2015-04-25 | Discharge: 2015-05-06 | DRG: 330 | Disposition: A | Discharge status: Home Health | Payer: Medicare Other | Attending: Surgery | Admitting: Surgery

## 2015-04-25 ENCOUNTER — Encounter (HOSPITAL_COMMUNITY): Payer: Self-pay | Admitting: Emergency Medicine

## 2015-04-25 DIAGNOSIS — R064 Hyperventilation: Secondary | ICD-10-CM | POA: Diagnosis not present

## 2015-04-25 DIAGNOSIS — I2510 Atherosclerotic heart disease of native coronary artery without angina pectoris: Secondary | ICD-10-CM | POA: Diagnosis present

## 2015-04-25 DIAGNOSIS — I5032 Chronic diastolic (congestive) heart failure: Secondary | ICD-10-CM | POA: Diagnosis present

## 2015-04-25 DIAGNOSIS — N200 Calculus of kidney: Secondary | ICD-10-CM | POA: Diagnosis present

## 2015-04-25 DIAGNOSIS — K5720 Diverticulitis of large intestine with perforation and abscess without bleeding: Secondary | ICD-10-CM | POA: Diagnosis present

## 2015-04-25 DIAGNOSIS — R4589 Other symptoms and signs involving emotional state: Secondary | ICD-10-CM | POA: Diagnosis present

## 2015-04-25 DIAGNOSIS — I110 Hypertensive heart disease with heart failure: Secondary | ICD-10-CM | POA: Diagnosis present

## 2015-04-25 DIAGNOSIS — R0902 Hypoxemia: Secondary | ICD-10-CM

## 2015-04-25 DIAGNOSIS — E873 Alkalosis: Secondary | ICD-10-CM | POA: Diagnosis not present

## 2015-04-25 DIAGNOSIS — K567 Ileus, unspecified: Secondary | ICD-10-CM | POA: Diagnosis not present

## 2015-04-25 DIAGNOSIS — Z8249 Family history of ischemic heart disease and other diseases of the circulatory system: Secondary | ICD-10-CM

## 2015-04-25 DIAGNOSIS — Z881 Allergy status to other antibiotic agents status: Secondary | ICD-10-CM

## 2015-04-25 DIAGNOSIS — F329 Major depressive disorder, single episode, unspecified: Secondary | ICD-10-CM | POA: Diagnosis present

## 2015-04-25 DIAGNOSIS — R451 Restlessness and agitation: Secondary | ICD-10-CM | POA: Diagnosis present

## 2015-04-25 DIAGNOSIS — E119 Type 2 diabetes mellitus without complications: Secondary | ICD-10-CM | POA: Diagnosis present

## 2015-04-25 DIAGNOSIS — I252 Old myocardial infarction: Secondary | ICD-10-CM

## 2015-04-25 DIAGNOSIS — E785 Hyperlipidemia, unspecified: Secondary | ICD-10-CM | POA: Diagnosis present

## 2015-04-25 DIAGNOSIS — Z87891 Personal history of nicotine dependence: Secondary | ICD-10-CM

## 2015-04-25 DIAGNOSIS — F10231 Alcohol dependence with withdrawal delirium: Secondary | ICD-10-CM | POA: Diagnosis present

## 2015-04-25 DIAGNOSIS — K219 Gastro-esophageal reflux disease without esophagitis: Secondary | ICD-10-CM | POA: Diagnosis present

## 2015-04-25 DIAGNOSIS — Z800 Family history of malignant neoplasm of digestive organs: Secondary | ICD-10-CM

## 2015-04-25 DIAGNOSIS — E118 Type 2 diabetes mellitus with unspecified complications: Secondary | ICD-10-CM | POA: Diagnosis present

## 2015-04-25 DIAGNOSIS — F39 Unspecified mood [affective] disorder: Secondary | ICD-10-CM | POA: Diagnosis present

## 2015-04-25 DIAGNOSIS — R410 Disorientation, unspecified: Secondary | ICD-10-CM | POA: Diagnosis present

## 2015-04-25 DIAGNOSIS — K5792 Diverticulitis of intestine, part unspecified, without perforation or abscess without bleeding: Secondary | ICD-10-CM | POA: Diagnosis present

## 2015-04-25 DIAGNOSIS — E039 Hypothyroidism, unspecified: Secondary | ICD-10-CM | POA: Diagnosis present

## 2015-04-25 DIAGNOSIS — Z7984 Long term (current) use of oral hypoglycemic drugs: Secondary | ICD-10-CM

## 2015-04-25 DIAGNOSIS — N179 Acute kidney failure, unspecified: Secondary | ICD-10-CM | POA: Diagnosis not present

## 2015-04-25 DIAGNOSIS — Z96641 Presence of right artificial hip joint: Secondary | ICD-10-CM | POA: Diagnosis present

## 2015-04-25 DIAGNOSIS — R1084 Generalized abdominal pain: Secondary | ICD-10-CM | POA: Diagnosis not present

## 2015-04-25 DIAGNOSIS — Z833 Family history of diabetes mellitus: Secondary | ICD-10-CM

## 2015-04-25 DIAGNOSIS — M109 Gout, unspecified: Secondary | ICD-10-CM | POA: Diagnosis present

## 2015-04-25 DIAGNOSIS — R109 Unspecified abdominal pain: Secondary | ICD-10-CM | POA: Diagnosis present

## 2015-04-25 DIAGNOSIS — Z951 Presence of aortocoronary bypass graft: Secondary | ICD-10-CM

## 2015-04-25 DIAGNOSIS — Z7982 Long term (current) use of aspirin: Secondary | ICD-10-CM

## 2015-04-25 DIAGNOSIS — E876 Hypokalemia: Secondary | ICD-10-CM | POA: Diagnosis not present

## 2015-04-25 DIAGNOSIS — F419 Anxiety disorder, unspecified: Secondary | ICD-10-CM | POA: Diagnosis present

## 2015-04-25 DIAGNOSIS — I10 Essential (primary) hypertension: Secondary | ICD-10-CM | POA: Diagnosis present

## 2015-04-25 DIAGNOSIS — Z96651 Presence of right artificial knee joint: Secondary | ICD-10-CM | POA: Diagnosis present

## 2015-04-25 HISTORY — DX: Heart failure, unspecified: I50.9

## 2015-04-25 HISTORY — DX: Type 2 diabetes mellitus without complications: E11.9

## 2015-04-25 LAB — CBC
HEMATOCRIT: 42 % (ref 39.0–52.0)
Hemoglobin: 13.3 g/dL (ref 13.0–17.0)
MCH: 27.7 pg (ref 26.0–34.0)
MCHC: 31.7 g/dL (ref 30.0–36.0)
MCV: 87.3 fL (ref 78.0–100.0)
Platelets: 260 10*3/uL (ref 150–400)
RBC: 4.81 MIL/uL (ref 4.22–5.81)
RDW: 16.2 % — ABNORMAL HIGH (ref 11.5–15.5)
WBC: 18.7 10*3/uL — ABNORMAL HIGH (ref 4.0–10.5)

## 2015-04-25 LAB — URINALYSIS, ROUTINE W REFLEX MICROSCOPIC
Bilirubin Urine: NEGATIVE
Glucose, UA: NEGATIVE mg/dL
HGB URINE DIPSTICK: NEGATIVE
Ketones, ur: NEGATIVE mg/dL
Leukocytes, UA: NEGATIVE
Nitrite: NEGATIVE
Protein, ur: 100 mg/dL — AB
SPECIFIC GRAVITY, URINE: 1.016 (ref 1.005–1.030)
pH: 6 (ref 5.0–8.0)

## 2015-04-25 LAB — URINE MICROSCOPIC-ADD ON

## 2015-04-25 LAB — COMPREHENSIVE METABOLIC PANEL
ALT: 118 U/L — AB (ref 17–63)
AST: 47 U/L — ABNORMAL HIGH (ref 15–41)
Albumin: 4.1 g/dL (ref 3.5–5.0)
Alkaline Phosphatase: 175 U/L — ABNORMAL HIGH (ref 38–126)
Anion gap: 12 (ref 5–15)
BUN: 23 mg/dL — ABNORMAL HIGH (ref 6–20)
CHLORIDE: 98 mmol/L — AB (ref 101–111)
CO2: 28 mmol/L (ref 22–32)
CREATININE: 1.61 mg/dL — AB (ref 0.61–1.24)
Calcium: 9.3 mg/dL (ref 8.9–10.3)
GFR calc non Af Amer: 42 mL/min — ABNORMAL LOW (ref >60)
GFR, EST AFRICAN AMERICAN: 48 mL/min — AB (ref >60)
Glucose, Bld: 126 mg/dL — ABNORMAL HIGH (ref 65–99)
POTASSIUM: 3.8 mmol/L (ref 3.5–5.1)
SODIUM: 138 mmol/L (ref 135–145)
Total Bilirubin: 0.9 mg/dL (ref 0.3–1.2)
Total Protein: 7.2 g/dL (ref 6.5–8.1)

## 2015-04-25 LAB — LACTIC ACID, PLASMA: Lactic Acid, Venous: 3.2 mmol/L (ref 0.5–2.0)

## 2015-04-25 LAB — LIPASE, BLOOD: LIPASE: 22 U/L (ref 11–51)

## 2015-04-25 LAB — I-STAT TROPONIN, ED: Troponin i, poc: 0 ng/mL (ref 0.00–0.08)

## 2015-04-25 MED ORDER — SODIUM CHLORIDE 0.9 % IV SOLN
1000.0000 mL | INTRAVENOUS | Status: DC
  Administered 2015-04-25: 1000 mL via INTRAVENOUS
  Administered 2015-04-26 (×2): via INTRAVENOUS
  Administered 2015-04-27: 1000 mL via INTRAVENOUS

## 2015-04-25 MED ORDER — IOHEXOL 300 MG/ML  SOLN
80.0000 mL | Freq: Once | INTRAMUSCULAR | Status: AC | PRN
  Administered 2015-04-25: 75 mL via INTRAVENOUS

## 2015-04-25 MED ORDER — ONDANSETRON HCL 4 MG/2ML IJ SOLN
4.0000 mg | Freq: Once | INTRAMUSCULAR | Status: AC
  Administered 2015-04-25: 4 mg via INTRAVENOUS
  Filled 2015-04-25: qty 2

## 2015-04-25 MED ORDER — HYDROMORPHONE HCL 1 MG/ML IJ SOLN
1.0000 mg | Freq: Once | INTRAMUSCULAR | Status: AC
  Administered 2015-04-25: 1 mg via INTRAVENOUS
  Filled 2015-04-25: qty 1

## 2015-04-25 MED ORDER — SODIUM CHLORIDE 0.9 % IV SOLN
1000.0000 mL | Freq: Once | INTRAVENOUS | Status: AC
  Administered 2015-04-25: 1000 mL via INTRAVENOUS

## 2015-04-25 MED ORDER — FENTANYL CITRATE (PF) 100 MCG/2ML IJ SOLN: 50.0000 ug | Freq: Once | INTRAMUSCULAR | Status: DC

## 2015-04-25 MED ORDER — PIPERACILLIN-TAZOBACTAM 3.375 G IVPB
3.3750 g | Freq: Once | INTRAVENOUS | Status: AC
  Administered 2015-04-25: 3.375 g via INTRAVENOUS
  Filled 2015-04-25: qty 50

## 2015-04-25 NOTE — ED Notes (Signed)
Patient transported to CT 

## 2015-04-25 NOTE — ED Provider Notes (Signed)
Patient seen/examined in the Emergency Department in conjunction with Midlevel Provider  Patient reports diffuse abdominal pain Exam : awake/alert, diffuse abd tenderness with guarding Plan: CT imaging confirms pneumoperitoneum  He has already been given zosyn Will consult surgery   Ripley Fraise, MD 04/25/15 2354

## 2015-04-25 NOTE — ED Provider Notes (Signed)
D/w dr Georganna Skeans He will see patient He also requests medicine consult   Ripley Fraise, MD 04/25/15 2359

## 2015-04-25 NOTE — ED Notes (Signed)
Pt c/o severe mid to R abdominal pain since this am with nv. Last BM last night was normal.

## 2015-04-25 NOTE — ED Provider Notes (Signed)
CSN: [id number]     Arrival date & time 04/25/15  1941 History   First MD Initiated Contact with Patient 04/25/15 2039     Chief Complaint  Patient presents with  . Abdominal Pain     (Consider location/radiation/quality/duration/timing/severity/associated sxs/prior Treatment) HPI Comments: [name] [name] is a 70 y.o. male who presents today with abdominal pain. The pain began 9 hours ago around lunchtime, patient had not eaten since breakfast (oatmeal). Pain is located in the diffusely with radiation to lower back. The pain is described as sharp, shooting and stabbing, and is 10/10 in intensity. Pain is severe with cyclical worse stabbing pain q1-2 min. Symptoms have been gradually worsening since onset. Aggravating factors include movement. The patient endorses nausea, sweats and vomiting. 2 episodes of bilious vomiting. Had small, normal BM this afternoon. Patient has tried  Fleets enema without relief. Also had 2 Klonazepam without relief of anxiety or pain. Patient has had lap chole and appendectomy. Patient sedentary at home, spends most of his days in bed due to musculoskeletal pain.  Patient is a 70 y.o. male presenting with abdominal pain. The history is provided by the patient and the spouse.  Abdominal Pain Associated symptoms: fever, nausea, shortness of breath (in severe pain) and vomiting   Associated symptoms: no chest pain, no chills, no constipation, no diarrhea and no dysuria     Past Medical History  Diagnosis Date  . Arthritis   . Hypertension   . GERD (gastroesophageal reflux disease)   . Gout   . Anal fistula   . Myocardial infarction ([place])   . CAD (coronary artery disease)     a. s/p CABG 2001; b. LHC (3/09):  S-RCA, L-LAD, S-OM1/dCFX all patent, EF 55-60%;  c. myoview (12/12):  no ischemia, EF 68%;  d. Dob Echo (1/14):  + ECG changes but normal echo => med Rx cont'd  . Hyperlipidemia   . CHF (congestive heart failure) ([place])   . Diabetes mellitus without complication  [facility])    Past Surgical History  Procedure Laterality Date  . Appendectomy    . Joint replacement      Right knee  . Cholecystectomy    . Coronary artery bypass graft      2001 LIMA LAD, SVG OM/Circ dista, SVG PDA.  Cath 2009 with patent grafts  . Treatment fistula anal    . Hip arthroplasty Right 03/01/2013    Procedure: ARTHROPLASTY BIPOLAR HIP;  Surgeon: Alta Corning, MD;  Location: [facility] ORS;  Service: Orthopedics;  Laterality: Right;   Family History  Problem Relation Age of Onset  . Colon cancer Mother   . Heart disease Father   . Heart disease Sister   . Heart disease Brother   . Diabetes Sister   . Heart disease Brother   . Heart disease Brother    Social History  Substance Use Topics  . Smoking status: Former Research scientist (life sciences)  . Smokeless tobacco: Never Used  . Alcohol Use: 21.6 oz/week    36 Cans of beer per week     Comment: occasionally     Review of Systems  Constitutional: Positive for fever. Negative for chills.  HENT: Negative for facial swelling.   Respiratory: Positive for shortness of breath (in severe pain).   Cardiovascular: Negative for chest pain.  Gastrointestinal: Positive for nausea, vomiting, abdominal pain and abdominal distention. Negative for diarrhea and constipation.  Genitourinary: Negative for dysuria.  Musculoskeletal: Positive for back pain.  Skin: Negative for rash and wound.  Neurological: Negative for headaches.  Psychiatric/Behavioral: The patient is not nervous/anxious.       Allergies  Ciprofloxacin hcl  Home Medications   Prior to Admission medications   Medication Sig Start Date End Date Taking? Authorizing Provider  allopurinol (ZYLOPRIM) 300 MG tablet Take 300 mg by mouth every evening.    Yes Historical Provider, MD  Artificial Tear Ointment (DRY EYES OP) Place 1 drop into both eyes 2 (two) times daily as needed (dry eyes).   Yes Historical Provider, MD  aspirin EC 325 MG tablet Take 1 tablet (325 mg total) by mouth 2 (two)  times daily after a meal. Take x 1 month post op. Patient taking differently: Take 325 mg by mouth daily with breakfast.  03/01/13  Yes Gary Fleet, PA-C  B Complex-C (B-COMPLEX WITH VITAMIN C) tablet Take 1 tablet by mouth daily with breakfast.    Yes Historical Provider, MD  calcium-vitamin D (OSCAL WITH D) 500-200 MG-UNIT per tablet Take 1 tablet by mouth daily with breakfast.   Yes Historical Provider, MD  cholecalciferol (VITAMIN D) 1000 UNITS tablet Take 2,000 Units by mouth daily.    Yes Historical Provider, MD  clonazePAM (KLONOPIN) 1 MG tablet Take 1 mg by mouth 2 (two) times daily.    Yes Historical Provider, MD  docusate sodium 100 MG CAPS Take 100 mg by mouth 2 (two) times daily as needed for mild constipation. Patient taking differently: Take 300 mg by mouth at bedtime.  03/03/13  Yes Nishant Dhungel, MD  fluticasone (FLONASE) 50 MCG/ACT nasal spray Place 1 spray into both nostrils daily as needed. 04/13/15  Yes Historical Provider, MD  gabapentin (NEURONTIN) 300 MG capsule Take 900 mg by mouth at bedtime.  02/21/13  Yes Historical Provider, MD  HYDROcodone-acetaminophen (NORCO) 10-325 MG per tablet Take 1 tablet by mouth every 6 (six) hours as needed for moderate pain.   Yes Historical Provider, MD  hydrocortisone cream 1 % Apply 1 application topically daily as needed for itching (for feet).   Yes Historical Provider, MD  levothyroxine (SYNTHROID, LEVOTHROID) 75 MCG tablet Take 75 mcg by mouth every morning.  04/13/15  Yes Historical Provider, MD  MAGNESIUM OXIDE PO Take 500 mg by mouth daily with breakfast.    Yes Historical Provider, MD  metFORMIN (GLUCOPHAGE-XR) 500 MG 24 hr tablet Take 1,000 mg by mouth at bedtime. 04/24/15  Yes Historical Provider, MD  metoprolol (LOPRESSOR) 100 MG tablet Take 100 mg by mouth 2 (two) times daily. 04/24/15  Yes Historical Provider, MD  nitroGLYCERIN (NITROSTAT) 0.4 MG SL tablet Place 1 tablet (0.4 mg total) under the tongue every 5 (five) minutes as  needed. 02/23/12  Yes Minus Breeding, MD  omeprazole (PRILOSEC) 40 MG capsule Take 40 mg by mouth daily. 04/13/15  Yes Historical Provider, MD  potassium gluconate 595 (99 K) MG TABS tablet Take 595 mg by mouth daily.   Yes Historical Provider, MD  Probiotic Product (PROBIOTIC DAILY PO) Take 1 capsule by mouth every morning.    Yes Historical Provider, MD  valsartan (DIOVAN) 80 MG tablet Take 80 mg by mouth daily with breakfast.   Yes Historical Provider, MD  venlafaxine XR (EFFEXOR-XR) 75 MG 24 hr capsule Take 225 mg by mouth at bedtime. 04/19/15  Yes Historical Provider, MD  azithromycin (ZITHROMAX) 500 MG tablet Take 1 tablet (500 mg total) by mouth daily. Take on 08/28/14 Patient not taking: Reported on 04/25/2015 08/28/14   Orson Eva, MD  cefdinir (OMNICEF) 300 MG capsule Take  1 capsule (300 mg total) by mouth 2 (two) times daily. Start 08/28/14 Patient not taking: Reported on 04/25/2015 08/28/14   Orson Eva, MD  furosemide (LASIX) 20 MG tablet Take 1 tablet (20 mg total) by mouth as needed. Patient not taking: Reported on 04/25/2015 02/23/12   Minus Breeding, MD  metoprolol succinate (TOPROL-XL) 100 MG 24 hr tablet Take 1 tablet (100 mg total) by mouth daily. Patient not taking: Reported on 04/25/2015 02/23/12   Minus Breeding, MD   BP 108/68 mmHg  Pulse 109  Temp(Src) 99 F (37.2 C) (Oral)  Resp 25  Wt 103.42 kg  SpO2 95% Physical Exam  Constitutional: He appears well-developed and well-nourished. He appears distressed.  HENT:  Head: Normocephalic and atraumatic.  Mouth/Throat: Oropharyngeal exudate present.  Eyes: Conjunctivae are normal. Pupils are equal, round, and reactive to light. Right eye exhibits no discharge. Left eye exhibits no discharge. No scleral icterus.  Neck: Normal range of motion. Neck supple. No thyromegaly present.  Cardiovascular: Regular rhythm, normal heart sounds and intact distal pulses.  Exam reveals no gallop and no friction rub.   No murmur heard. Pulmonary/Chest:  Breath sounds normal. No stridor. No respiratory distress. He has no wheezes. He has no rales.  Abdominal: He exhibits distension. There is generalized tenderness. There is rebound and guarding.  Musculoskeletal: He exhibits no edema.  Lymphadenopathy:    He has no cervical adenopathy.  Neurological: He is alert. Coordination normal.  Skin: Skin is warm. No rash noted. He is diaphoretic. No pallor.  Psychiatric: He has a normal mood and affect.  Nursing note and vitals reviewed.   ED Course  Procedures (including critical care time) Labs Review Labs Reviewed  COMPREHENSIVE METABOLIC PANEL - Abnormal; Notable for the following:    Chloride 98 (*)    Glucose, Bld 126 (*)    BUN 23 (*)    Creatinine, Ser 1.61 (*)    AST 47 (*)    ALT 118 (*)    Alkaline Phosphatase 175 (*)    GFR calc non Af Amer 42 (*)    GFR calc Af Amer 48 (*)    All other components within normal limits  CBC - Abnormal; Notable for the following:    WBC 18.7 (*)    RDW 16.2 (*)    All other components within normal limits  URINALYSIS, ROUTINE W REFLEX MICROSCOPIC (NOT AT [facility]) - Abnormal; Notable for the following:    Protein, ur 100 (*)    All other components within normal limits  LACTIC ACID, PLASMA - Abnormal; Notable for the following:    Lactic Acid, Venous 3.2 (*)    All other components within normal limits  URINE MICROSCOPIC-ADD ON - Abnormal; Notable for the following:    Squamous Epithelial / LPF 0-5 (*)    Bacteria, UA RARE (*)    Casts HYALINE CASTS (*)    All other components within normal limits  LIPASE, BLOOD  LACTIC ACID, PLASMA  I-STAT TROPOININ, ED  I-STAT CG4 LACTIC ACID, ED  I-STAT CG4 LACTIC ACID, ED    Imaging Review Ct Abdomen Pelvis W Contrast  04/25/2015  CLINICAL DATA:  Severe diffuse abdominal pain. EXAM: CT ABDOMEN AND PELVIS WITH CONTRAST TECHNIQUE: Multidetector CT imaging of the abdomen and pelvis was performed using the standard protocol following bolus administration  of intravenous contrast. CONTRAST:  7m OMNIPAQUE IOHEXOL 300 MG/ML  SOLN COMPARISON:  12/04/2013 FINDINGS: Postoperative changes in the mediastinum. Atelectasis in the lung bases. Free abdominal  air is demonstrated in the abdomen. There is diverticulosis of the sigmoid colon with pericolonic extraluminal gas and infiltration around the colon at the lower descending region consistent with perforated diverticulitis. Surgical absence of the gallbladder. Mild bile duct dilatation is likely physiologic in the postoperative state. Pancreas is atrophic. No focal liver lesions. Spleen size is normal. No adrenal gland nodules. Diffuse calcification of abdominal aorta without aneurysm. Bilateral renal cysts. Nonobstructing stones in both kidneys. No hydronephrosis. Inferior vena cava and retroperitoneal lymph nodes are unremarkable. Stomach, small bowel, and colon are not abnormally distended. No free fluid in the abdomen. Pelvis: Right hip arthroplasty causes streak artifact, limiting visualization of some areas. Prostate gland is not enlarged. Bladder wall is not thickened. Diverticulosis of sigmoid colon. Surgical absence of the appendix. Fat in the inguinal canals bilaterally. No free or loculated pelvic fluid collections. Degenerative changes throughout the lumbar spine. No destructive bone lesions. IMPRESSION: Perforated diverticulitis in the low descending colon with free intraperitoneal gas. Nonobstructing stones in both kidneys. Bilateral renal cysts. Surgical absence of the gallbladder with bile duct dilatation, likely physiologic. These results were called by telephone at the time of interpretation on 04/25/2015 at 11:51 pm to Dr. Doylene Canard , who verbally acknowledged these results. Electronically Signed   By: Lucienne Capers M.D.   On: 04/25/2015 23:53   Dg Chest Portable 1 View  04/25/2015  CLINICAL DATA:  Nausea and vomiting.  Abdominal pain. EXAM: PORTABLE CHEST 1 VIEW COMPARISON:  08/24/2014 FINDINGS: Patient  is post median sternotomy. Lower lung volumes from prior exam. The heart is enlarged, accentuated by low lung volumes. Probable bibasilar atelectasis. No evidence of pulmonary edema, confluent airspace disease, pleural effusion or pneumothorax. IMPRESSION: Hypoventilatory chest.  Cardiomegaly, accentuated by technique. Electronically Signed   By: Jeb Levering M.D.   On: 04/25/2015 22:50   Dg Abd Portable 2v  04/25/2015  CLINICAL DATA:  Nausea vomiting and abdominal pain EXAM: PORTABLE ABDOMEN - 2 VIEW COMPARISON:  03/07/2014 FINDINGS: Images are limited by patient motion and portable technique. Nonobstructive bowel gas pattern.  No bowel thickening. Gas density under the diaphragm bilaterally could be free air versus bowel gas. If there is concern of acute abdominal pain, CT scanning of the abdomen pelvis may be helpful for further evaluation Lumbar dextroscoliosis.  Cholecystectomy. IMPRESSION: Normal bowel gas pattern. Study is limited by motion and portable technique. Cannot rule out free air.  CT if indicated clinically. Electronically Signed   By: Franchot Gallo M.D.   On: 04/25/2015 22:50   I have personally reviewed and evaluated these images and lab results as part of my medical decision-making.   EKG Interpretation   Date/Time:  Thursday April 25 2015 19:55:34 EST Ventricular Rate:  116 PR Interval:  184 QRS Duration: 92 QT Interval:  356 QTC Calculation: 494 R Axis:   67 Text Interpretation:  Sinus tachycardia Otherwise normal ECG Confirmed by  Christy Gentles  MD, DONALD ([id number]) on 04/25/2015 9:26:21 PM      MDM   [name] is a 70 y.o. male who presents today with severe, diffuse abdominal pain since lunchtime today. Patient looks very ill on exam. Diffusely tender abdomen, guarding, rebound. CT Ab/Pelvis shows free air in abdomen, perforated diverticulitis. Nonobstructing stones in both kidneys, bilateral renal cysts. Bilateral renal cysts. EKG Tachy. CXR Cardiomegaly, cannot rule  out free air. Lactate 3.2. WBC 18.7 AST 47, ALT 118, Alk Phos 175.  Dr. Christy Gentles spoke with Dr. Grandville Silos with General Surgery who recommends emergency surgery  for the patient. Alecia Lemming PA-C spoke with Dr. Hal Hope with [facility] who has agreed to admit the patient to the hospital and continue evaluation and treatment.  CRITICAL CARE Performed by: Frederica Kuster   Total critical care time: 45 minutes  Critical care time was exclusive of separately billable procedures and treating other patients.  Critical care was necessary to treat or prevent imminent or life-threatening deterioration.  Critical care was time spent personally by me on the following activities: development of treatment plan with patient and/or surrogate as well as nursing, evaluation of patient's response to treatment, examination of patient, obtaining history from patient or surrogate, ordering and performing treatments and interventions, ordering and review of laboratory studies, ordering and review of radiographic studies, pulse oximetry and re-evaluation of patient's condition.   Final diagnoses:  Abdominal pain  Diverticulitis of large intestine with perforation without bleeding       Frederica Kuster, PA-C 04/26/15 0104  Ripley Fraise, MD 04/26/15 1353

## 2015-04-26 ENCOUNTER — Inpatient Hospital Stay (HOSPITAL_COMMUNITY): Payer: Medicare Other | Admitting: Certified Registered"

## 2015-04-26 ENCOUNTER — Encounter (HOSPITAL_COMMUNITY): Payer: Self-pay | Admitting: Certified Registered"

## 2015-04-26 ENCOUNTER — Encounter (HOSPITAL_COMMUNITY): Admission: EM | Disposition: A | Discharge status: Home Health | Payer: Self-pay | Source: Home / Self Care

## 2015-04-26 ENCOUNTER — Inpatient Hospital Stay (HOSPITAL_COMMUNITY): Payer: Medicare Other

## 2015-04-26 DIAGNOSIS — F419 Anxiety disorder, unspecified: Secondary | ICD-10-CM | POA: Diagnosis present

## 2015-04-26 DIAGNOSIS — K219 Gastro-esophageal reflux disease without esophagitis: Secondary | ICD-10-CM

## 2015-04-26 DIAGNOSIS — E119 Type 2 diabetes mellitus without complications: Secondary | ICD-10-CM | POA: Diagnosis present

## 2015-04-26 DIAGNOSIS — Z87891 Personal history of nicotine dependence: Secondary | ICD-10-CM | POA: Diagnosis not present

## 2015-04-26 DIAGNOSIS — I252 Old myocardial infarction: Secondary | ICD-10-CM | POA: Diagnosis not present

## 2015-04-26 DIAGNOSIS — R064 Hyperventilation: Secondary | ICD-10-CM | POA: Diagnosis not present

## 2015-04-26 DIAGNOSIS — F39 Unspecified mood [affective] disorder: Secondary | ICD-10-CM | POA: Diagnosis present

## 2015-04-26 DIAGNOSIS — E118 Type 2 diabetes mellitus with unspecified complications: Secondary | ICD-10-CM | POA: Diagnosis not present

## 2015-04-26 DIAGNOSIS — K567 Ileus, unspecified: Secondary | ICD-10-CM | POA: Diagnosis not present

## 2015-04-26 DIAGNOSIS — F329 Major depressive disorder, single episode, unspecified: Secondary | ICD-10-CM | POA: Diagnosis present

## 2015-04-26 DIAGNOSIS — Z8249 Family history of ischemic heart disease and other diseases of the circulatory system: Secondary | ICD-10-CM | POA: Diagnosis not present

## 2015-04-26 DIAGNOSIS — I110 Hypertensive heart disease with heart failure: Secondary | ICD-10-CM | POA: Diagnosis present

## 2015-04-26 DIAGNOSIS — M109 Gout, unspecified: Secondary | ICD-10-CM | POA: Diagnosis present

## 2015-04-26 DIAGNOSIS — K5720 Diverticulitis of large intestine with perforation and abscess without bleeding: Secondary | ICD-10-CM | POA: Diagnosis present

## 2015-04-26 DIAGNOSIS — E039 Hypothyroidism, unspecified: Secondary | ICD-10-CM

## 2015-04-26 DIAGNOSIS — K5792 Diverticulitis of intestine, part unspecified, without perforation or abscess without bleeding: Secondary | ICD-10-CM | POA: Diagnosis present

## 2015-04-26 DIAGNOSIS — Z951 Presence of aortocoronary bypass graft: Secondary | ICD-10-CM | POA: Diagnosis not present

## 2015-04-26 DIAGNOSIS — Z7982 Long term (current) use of aspirin: Secondary | ICD-10-CM | POA: Diagnosis not present

## 2015-04-26 DIAGNOSIS — I10 Essential (primary) hypertension: Secondary | ICD-10-CM

## 2015-04-26 DIAGNOSIS — E873 Alkalosis: Secondary | ICD-10-CM | POA: Diagnosis not present

## 2015-04-26 DIAGNOSIS — R451 Restlessness and agitation: Secondary | ICD-10-CM | POA: Diagnosis present

## 2015-04-26 DIAGNOSIS — R1084 Generalized abdominal pain: Secondary | ICD-10-CM | POA: Diagnosis present

## 2015-04-26 DIAGNOSIS — E785 Hyperlipidemia, unspecified: Secondary | ICD-10-CM | POA: Diagnosis present

## 2015-04-26 DIAGNOSIS — R410 Disorientation, unspecified: Secondary | ICD-10-CM | POA: Diagnosis present

## 2015-04-26 DIAGNOSIS — N200 Calculus of kidney: Secondary | ICD-10-CM | POA: Diagnosis present

## 2015-04-26 DIAGNOSIS — Z881 Allergy status to other antibiotic agents status: Secondary | ICD-10-CM | POA: Diagnosis not present

## 2015-04-26 DIAGNOSIS — F10231 Alcohol dependence with withdrawal delirium: Secondary | ICD-10-CM | POA: Diagnosis present

## 2015-04-26 DIAGNOSIS — I5032 Chronic diastolic (congestive) heart failure: Secondary | ICD-10-CM | POA: Diagnosis present

## 2015-04-26 DIAGNOSIS — Z7984 Long term (current) use of oral hypoglycemic drugs: Secondary | ICD-10-CM | POA: Diagnosis not present

## 2015-04-26 DIAGNOSIS — Z833 Family history of diabetes mellitus: Secondary | ICD-10-CM | POA: Diagnosis not present

## 2015-04-26 DIAGNOSIS — Z800 Family history of malignant neoplasm of digestive organs: Secondary | ICD-10-CM | POA: Diagnosis not present

## 2015-04-26 DIAGNOSIS — N179 Acute kidney failure, unspecified: Secondary | ICD-10-CM | POA: Diagnosis not present

## 2015-04-26 DIAGNOSIS — Z96651 Presence of right artificial knee joint: Secondary | ICD-10-CM | POA: Diagnosis present

## 2015-04-26 DIAGNOSIS — E876 Hypokalemia: Secondary | ICD-10-CM | POA: Diagnosis not present

## 2015-04-26 DIAGNOSIS — I2510 Atherosclerotic heart disease of native coronary artery without angina pectoris: Secondary | ICD-10-CM | POA: Diagnosis present

## 2015-04-26 DIAGNOSIS — Z96641 Presence of right artificial hip joint: Secondary | ICD-10-CM | POA: Diagnosis present

## 2015-04-26 HISTORY — PX: PARTIAL COLECTOMY: SHX5273

## 2015-04-26 LAB — CREATININE, SERUM
Creatinine, Ser: 1.41 mg/dL — ABNORMAL HIGH (ref 0.61–1.24)
GFR, EST AFRICAN AMERICAN: 57 mL/min — AB (ref >60)
GFR, EST NON AFRICAN AMERICAN: 49 mL/min — AB (ref >60)

## 2015-04-26 LAB — GLUCOSE, CAPILLARY
GLUCOSE-CAPILLARY: 118 mg/dL — AB (ref 65–99)
GLUCOSE-CAPILLARY: 111 mg/dL — AB (ref 65–99)
Glucose-Capillary: 114 mg/dL — ABNORMAL HIGH (ref 65–99)
Glucose-Capillary: 120 mg/dL — ABNORMAL HIGH (ref 65–99)
Glucose-Capillary: 125 mg/dL — ABNORMAL HIGH (ref 65–99)
Glucose-Capillary: 168 mg/dL — ABNORMAL HIGH (ref 65–99)

## 2015-04-26 LAB — CBC
HEMATOCRIT: 37.4 % — AB (ref 39.0–52.0)
Hemoglobin: 11.9 g/dL — ABNORMAL LOW (ref 13.0–17.0)
MCH: 27.5 pg (ref 26.0–34.0)
MCHC: 31.8 g/dL (ref 30.0–36.0)
MCV: 86.6 fL (ref 78.0–100.0)
PLATELETS: 222 10*3/uL (ref 150–400)
RBC: 4.32 MIL/uL (ref 4.22–5.81)
RDW: 16.6 % — AB (ref 11.5–15.5)
WBC: 20.3 10*3/uL — AB (ref 4.0–10.5)

## 2015-04-26 LAB — LACTIC ACID, PLASMA: Lactic Acid, Venous: 2.4 mmol/L (ref 0.5–2.0)

## 2015-04-26 LAB — MRSA PCR SCREENING: MRSA by PCR: POSITIVE — AB

## 2015-04-26 SURGERY — COLECTOMY, PARTIAL
Anesthesia: General | Site: Abdomen

## 2015-04-26 MED ORDER — ALBUMIN HUMAN 5 % IV SOLN
12.5000 g | Freq: Once | INTRAVENOUS | Status: AC
  Administered 2015-04-26: 12.5 g via INTRAVENOUS
  Filled 2015-04-26: qty 250

## 2015-04-26 MED ORDER — FLUTICASONE PROPIONATE 50 MCG/ACT NA SUSP: 1.0000 | Freq: Every day | NASAL | Status: DC | PRN

## 2015-04-26 MED ORDER — ONDANSETRON HCL 4 MG/2ML IJ SOLN
INTRAMUSCULAR | Status: DC | PRN
  Administered 2015-04-26: 4 mg via INTRAVENOUS

## 2015-04-26 MED ORDER — MUPIROCIN 2 % EX OINT
1.0000 "application " | TOPICAL_OINTMENT | Freq: Two times a day (BID) | CUTANEOUS | Status: AC
  Administered 2015-04-26 – 2015-04-30 (×10): 1 via NASAL
  Filled 2015-04-26 (×4): qty 22

## 2015-04-26 MED ORDER — ONDANSETRON HCL 4 MG/2ML IJ SOLN: 4.0000 mg | Freq: Four times a day (QID) | INTRAMUSCULAR | Status: DC | PRN

## 2015-04-26 MED ORDER — KCL IN DEXTROSE-NACL 20-5-0.45 MEQ/L-%-% IV SOLN
INTRAVENOUS | Status: AC
  Filled 2015-04-26: qty 1000

## 2015-04-26 MED ORDER — LABETALOL HCL 5 MG/ML IV SOLN
5.0000 mg | INTRAVENOUS | Status: DC | PRN
  Administered 2015-04-26: 5 mg via INTRAVENOUS

## 2015-04-26 MED ORDER — DEXAMETHASONE SODIUM PHOSPHATE 4 MG/ML IJ SOLN
INTRAMUSCULAR | Status: DC | PRN
  Administered 2015-04-26: 4 mg via INTRAVENOUS

## 2015-04-26 MED ORDER — DEXAMETHASONE SODIUM PHOSPHATE 4 MG/ML IJ SOLN
INTRAMUSCULAR | Status: AC
  Filled 2015-04-26: qty 1

## 2015-04-26 MED ORDER — SODIUM CHLORIDE 0.9 % IV BOLUS (SEPSIS)
1000.0000 mL | Freq: Once | INTRAVENOUS | Status: AC
  Administered 2015-04-26: 1000 mL via INTRAVENOUS

## 2015-04-26 MED ORDER — LORAZEPAM 2 MG/ML IJ SOLN
0.5000 mg | Freq: Two times a day (BID) | INTRAMUSCULAR | Status: DC | PRN
  Administered 2015-04-26: 0.5 mg via INTRAVENOUS
  Filled 2015-04-26: qty 1

## 2015-04-26 MED ORDER — SUFENTANIL CITRATE 50 MCG/ML IV SOLN
INTRAVENOUS | Status: AC
  Filled 2015-04-26: qty 1

## 2015-04-26 MED ORDER — HYDROMORPHONE HCL 1 MG/ML IJ SOLN
INTRAMUSCULAR | Status: AC
  Filled 2015-04-26: qty 1

## 2015-04-26 MED ORDER — DIPHENHYDRAMINE HCL 50 MG/ML IJ SOLN: 12.5000 mg | Freq: Four times a day (QID) | INTRAMUSCULAR | Status: DC | PRN

## 2015-04-26 MED ORDER — PANTOPRAZOLE SODIUM 40 MG IV SOLR
40.0000 mg | Freq: Every day | INTRAVENOUS | Status: DC
  Administered 2015-04-26 – 2015-04-30 (×5): 40 mg via INTRAVENOUS
  Filled 2015-04-26 (×4): qty 40

## 2015-04-26 MED ORDER — SODIUM CHLORIDE 0.9% FLUSH: 9.0000 mL | INTRAVENOUS | Status: DC | PRN

## 2015-04-26 MED ORDER — NITROGLYCERIN 0.4 MG SL SUBL
0.4000 mg | SUBLINGUAL_TABLET | SUBLINGUAL | Status: DC | PRN
  Filled 2015-04-26: qty 1

## 2015-04-26 MED ORDER — HYDROMORPHONE 1 MG/ML IV SOLN
INTRAVENOUS | Status: AC
  Filled 2015-04-26: qty 25

## 2015-04-26 MED ORDER — MIDAZOLAM HCL 5 MG/5ML IJ SOLN
INTRAMUSCULAR | Status: DC | PRN
  Administered 2015-04-26: 2 mg via INTRAVENOUS

## 2015-04-26 MED ORDER — ONDANSETRON HCL 4 MG/2ML IJ SOLN
INTRAMUSCULAR | Status: AC
  Filled 2015-04-26: qty 2

## 2015-04-26 MED ORDER — LACTATED RINGERS IV SOLN
INTRAVENOUS | Status: DC | PRN
  Administered 2015-04-26: 03:00:00 via INTRAVENOUS

## 2015-04-26 MED ORDER — HYDROMORPHONE HCL 1 MG/ML IJ SOLN: 1.0000 mg | INTRAMUSCULAR | Status: AC | PRN

## 2015-04-26 MED ORDER — KCL IN DEXTROSE-NACL 20-5-0.45 MEQ/L-%-% IV SOLN
INTRAVENOUS | Status: DC
  Administered 2015-04-26 (×2): via INTRAVENOUS
  Filled 2015-04-26 (×7): qty 1000

## 2015-04-26 MED ORDER — CHLORHEXIDINE GLUCONATE CLOTH 2 % EX PADS
6.0000 | MEDICATED_PAD | Freq: Every day | CUTANEOUS | Status: AC
  Administered 2015-04-27 – 2015-05-01 (×4): 6 via TOPICAL

## 2015-04-26 MED ORDER — LORAZEPAM 2 MG/ML IJ SOLN: 1.0000 mg | Freq: Four times a day (QID) | INTRAMUSCULAR | Status: DC | PRN

## 2015-04-26 MED ORDER — SUFENTANIL CITRATE 50 MCG/ML IV SOLN
INTRAVENOUS | Status: DC | PRN
  Administered 2015-04-26 (×5): 10 ug via INTRAVENOUS

## 2015-04-26 MED ORDER — PHENYLEPHRINE HCL 10 MG/ML IJ SOLN
INTRAMUSCULAR | Status: DC | PRN
  Administered 2015-04-26: 80 ug via INTRAVENOUS

## 2015-04-26 MED ORDER — LIDOCAINE HCL (CARDIAC) 20 MG/ML IV SOLN
INTRAVENOUS | Status: AC
  Filled 2015-04-26: qty 5

## 2015-04-26 MED ORDER — MIDAZOLAM HCL 2 MG/2ML IJ SOLN
INTRAMUSCULAR | Status: AC
  Filled 2015-04-26: qty 2

## 2015-04-26 MED ORDER — NALOXONE HCL 0.4 MG/ML IJ SOLN: 0.4000 mg | INTRAMUSCULAR | Status: DC | PRN

## 2015-04-26 MED ORDER — PIPERACILLIN-TAZOBACTAM 3.375 G IVPB
3.3750 g | Freq: Three times a day (TID) | INTRAVENOUS | Status: DC
  Administered 2015-04-26 – 2015-05-03 (×22): 3.375 g via INTRAVENOUS
  Filled 2015-04-26 (×26): qty 50

## 2015-04-26 MED ORDER — ROCURONIUM BROMIDE 100 MG/10ML IV SOLN
INTRAVENOUS | Status: DC | PRN
  Administered 2015-04-26: 50 mg via INTRAVENOUS

## 2015-04-26 MED ORDER — HYDROMORPHONE HCL 1 MG/ML IJ SOLN
1.0000 mg | Freq: Once | INTRAMUSCULAR | Status: AC
  Administered 2015-04-26: 1 mg via INTRAVENOUS
  Filled 2015-04-26: qty 1

## 2015-04-26 MED ORDER — PHENYLEPHRINE 40 MCG/ML (10ML) SYRINGE FOR IV PUSH (FOR BLOOD PRESSURE SUPPORT)
PREFILLED_SYRINGE | INTRAVENOUS | Status: AC
  Filled 2015-04-26: qty 10

## 2015-04-26 MED ORDER — HYDROMORPHONE HCL 1 MG/ML IJ SOLN
0.2500 mg | INTRAMUSCULAR | Status: DC | PRN
  Administered 2015-04-26 (×4): 0.5 mg via INTRAVENOUS

## 2015-04-26 MED ORDER — INSULIN ASPART 100 UNIT/ML ~~LOC~~ SOLN
0.0000 [IU] | SUBCUTANEOUS | Status: DC
  Administered 2015-04-26: 1 [IU] via SUBCUTANEOUS
  Administered 2015-04-26: 2 [IU] via SUBCUTANEOUS
  Administered 2015-04-28 – 2015-04-30 (×2): 1 [IU] via SUBCUTANEOUS
  Administered 2015-04-30 (×2): 2 [IU] via SUBCUTANEOUS
  Administered 2015-05-01 – 2015-05-03 (×4): 1 [IU] via SUBCUTANEOUS
  Administered 2015-05-03: 2 [IU] via SUBCUTANEOUS

## 2015-04-26 MED ORDER — LEVOTHYROXINE SODIUM 100 MCG IV SOLR
37.5000 ug | Freq: Every day | INTRAVENOUS | Status: DC
  Administered 2015-04-27 – 2015-04-30 (×4): 37.5 ug via INTRAVENOUS
  Filled 2015-04-26 (×4): qty 5

## 2015-04-26 MED ORDER — SODIUM CHLORIDE 0.9 % IJ SOLN
INTRAMUSCULAR | Status: AC
  Filled 2015-04-26: qty 10

## 2015-04-26 MED ORDER — GABAPENTIN 300 MG PO CAPS
900.0000 mg | ORAL_CAPSULE | Freq: Every day | ORAL | Status: DC
  Administered 2015-04-27 – 2015-05-05 (×9): 900 mg via ORAL
  Filled 2015-04-26 (×9): qty 3

## 2015-04-26 MED ORDER — ENOXAPARIN SODIUM 40 MG/0.4ML ~~LOC~~ SOLN
40.0000 mg | SUBCUTANEOUS | Status: DC
  Administered 2015-04-26 – 2015-05-05 (×10): 40 mg via SUBCUTANEOUS
  Filled 2015-04-26 (×10): qty 0.4

## 2015-04-26 MED ORDER — 0.9 % SODIUM CHLORIDE (POUR BTL) OPTIME
TOPICAL | Status: DC | PRN
  Administered 2015-04-26: 2000 mL
  Administered 2015-04-26: 4000 mL

## 2015-04-26 MED ORDER — PROPOFOL 10 MG/ML IV BOLUS
INTRAVENOUS | Status: AC
  Filled 2015-04-26: qty 20

## 2015-04-26 MED ORDER — PANTOPRAZOLE SODIUM 40 MG IV SOLR: 40.0000 mg | Freq: Every day | INTRAVENOUS | Status: DC

## 2015-04-26 MED ORDER — ROCURONIUM BROMIDE 50 MG/5ML IV SOLN
INTRAVENOUS | Status: AC
  Filled 2015-04-26: qty 1

## 2015-04-26 MED ORDER — METHOCARBAMOL 1000 MG/10ML IJ SOLN
1000.0000 mg | Freq: Three times a day (TID) | INTRAVENOUS | Status: DC | PRN
  Administered 2015-05-02 – 2015-05-06 (×5): 1000 mg via INTRAVENOUS
  Filled 2015-04-26 (×6): qty 10

## 2015-04-26 MED ORDER — METOPROLOL TARTRATE 50 MG PO TABS: 100.0000 mg | ORAL_TABLET | Freq: Two times a day (BID) | ORAL | Status: DC

## 2015-04-26 MED ORDER — SUGAMMADEX SODIUM 500 MG/5ML IV SOLN
INTRAVENOUS | Status: DC | PRN
  Administered 2015-04-26: 200 mg via INTRAVENOUS

## 2015-04-26 MED ORDER — METOPROLOL TARTRATE 1 MG/ML IV SOLN
5.0000 mg | Freq: Four times a day (QID) | INTRAVENOUS | Status: DC
  Administered 2015-04-26 – 2015-04-27 (×6): 5 mg via INTRAVENOUS
  Filled 2015-04-26 (×6): qty 5

## 2015-04-26 MED ORDER — HYDROMORPHONE 1 MG/ML IV SOLN
INTRAVENOUS | Status: DC
  Administered 2015-04-26: 3.4 mg via INTRAVENOUS

## 2015-04-26 MED ORDER — LIDOCAINE HCL (CARDIAC) 20 MG/ML IV SOLN
INTRAVENOUS | Status: DC | PRN
  Administered 2015-04-26: 100 mg via INTRAVENOUS

## 2015-04-26 MED ORDER — LEVOTHYROXINE SODIUM 75 MCG PO TABS: 75.0000 ug | ORAL_TABLET | ORAL | Status: DC

## 2015-04-26 MED ORDER — DIPHENHYDRAMINE HCL 12.5 MG/5ML PO ELIX
12.5000 mg | ORAL_SOLUTION | Freq: Four times a day (QID) | ORAL | Status: DC | PRN
  Filled 2015-04-26: qty 5

## 2015-04-26 MED ORDER — SUCCINYLCHOLINE CHLORIDE 20 MG/ML IJ SOLN
INTRAMUSCULAR | Status: AC
  Filled 2015-04-26: qty 1

## 2015-04-26 MED ORDER — PROPOFOL 10 MG/ML IV BOLUS
INTRAVENOUS | Status: DC | PRN
  Administered 2015-04-26: 50 mg via INTRAVENOUS
  Administered 2015-04-26: 150 mg via INTRAVENOUS

## 2015-04-26 MED ORDER — SUCCINYLCHOLINE CHLORIDE 20 MG/ML IJ SOLN
INTRAMUSCULAR | Status: DC | PRN
  Administered 2015-04-26: 100 mg via INTRAVENOUS

## 2015-04-26 SURGICAL SUPPLY — 59 items
BLADE SURG ROTATE 9660 (MISCELLANEOUS) ×1 IMPLANT
BNDG GAUZE ELAST 4 BULKY (GAUZE/BANDAGES/DRESSINGS) ×1 IMPLANT
CANISTER SUCTION 2500CC (MISCELLANEOUS) ×2 IMPLANT
CHLORAPREP W/TINT 26ML (MISCELLANEOUS) ×2 IMPLANT
COVER MAYO STAND STRL (DRAPES) ×3 IMPLANT
COVER SURGICAL LIGHT HANDLE (MISCELLANEOUS) ×2 IMPLANT
DRAPE LAPAROSCOPIC ABDOMINAL (DRAPES) ×2 IMPLANT
DRAPE PROXIMA HALF (DRAPES) ×4 IMPLANT
DRAPE UTILITY XL STRL (DRAPES) ×7 IMPLANT
DRAPE WARM FLUID 44X44 (DRAPE) ×2 IMPLANT
DRSG OPSITE POSTOP 4X10 (GAUZE/BANDAGES/DRESSINGS) IMPLANT
DRSG OPSITE POSTOP 4X8 (GAUZE/BANDAGES/DRESSINGS) IMPLANT
DRSG PAD ABDOMINAL 8X10 ST (GAUZE/BANDAGES/DRESSINGS) ×1 IMPLANT
ELECT BLADE 6.5 EXT (BLADE) ×2 IMPLANT
ELECT CAUTERY BLADE 6.4 (BLADE) ×3 IMPLANT
ELECT REM PT RETURN 9FT ADLT (ELECTROSURGICAL) ×2
ELECTRODE REM PT RTRN 9FT ADLT (ELECTROSURGICAL) ×1 IMPLANT
GLOVE BIO SURGEON STRL SZ8 (GLOVE) ×3 IMPLANT
GLOVE BIOGEL PI IND STRL 8 (GLOVE) ×2 IMPLANT
GLOVE BIOGEL PI INDICATOR 8 (GLOVE) ×1
GOWN STRL REUS W/ TWL LRG LVL3 (GOWN DISPOSABLE) ×4 IMPLANT
GOWN STRL REUS W/ TWL XL LVL3 (GOWN DISPOSABLE) ×2 IMPLANT
GOWN STRL REUS W/TWL LRG LVL3 (GOWN DISPOSABLE) ×2
GOWN STRL REUS W/TWL XL LVL3 (GOWN DISPOSABLE) ×4
KIT BASIN OR (CUSTOM PROCEDURE TRAY) ×2 IMPLANT
KIT ROOM TURNOVER OR (KITS) ×2 IMPLANT
LEGGING LITHOTOMY PAIR STRL (DRAPES) ×1 IMPLANT
LIGASURE IMPACT 36 18CM CVD LR (INSTRUMENTS) ×2 IMPLANT
NS IRRIG 1000ML POUR BTL (IV SOLUTION) ×4 IMPLANT
PACK GENERAL/GYN (CUSTOM PROCEDURE TRAY) ×2 IMPLANT
PAD ARMBOARD 7.5X6 YLW CONV (MISCELLANEOUS) ×4 IMPLANT
PENCIL BUTTON HOLSTER BLD 10FT (ELECTRODE) ×2 IMPLANT
RELOAD PROXIMATE 75MM BLUE (ENDOMECHANICALS) ×2 IMPLANT
RELOAD STAPLE 75 3.8 BLU REG (ENDOMECHANICALS) IMPLANT
SPECIMEN JAR X LARGE (MISCELLANEOUS) ×2 IMPLANT
SPONGE LAP 18X18 X RAY DECT (DISPOSABLE) ×1 IMPLANT
STAPLER PROXIMATE 75MM BLUE (STAPLE) ×1 IMPLANT
STAPLER VISISTAT 35W (STAPLE) ×2 IMPLANT
SUCTION POOLE TIP (SUCTIONS) ×2 IMPLANT
SURGILUBE 2OZ TUBE FLIPTOP (MISCELLANEOUS) IMPLANT
SUT PDS AB 1 TP1 96 (SUTURE) ×4 IMPLANT
SUT PROLENE 2 0 CT2 30 (SUTURE) IMPLANT
SUT PROLENE 2 0 KS (SUTURE) IMPLANT
SUT PROLENE 2 0 SH 30 (SUTURE) ×1 IMPLANT
SUT SILK 2 0 SH CR/8 (SUTURE) ×2 IMPLANT
SUT SILK 2 0 TIES 10X30 (SUTURE) ×2 IMPLANT
SUT SILK 3 0 SH CR/8 (SUTURE) ×2 IMPLANT
SUT SILK 3 0 TIES 10X30 (SUTURE) ×2 IMPLANT
SUT VIC AB 3-0 SH 18 (SUTURE) IMPLANT
SYR BULB IRRIGATION 50ML (SYRINGE) ×2 IMPLANT
TAPE CLOTH SURG 6X10 WHT LF (GAUZE/BANDAGES/DRESSINGS) ×1 IMPLANT
TOWEL OR 17X26 10 PK STRL BLUE (TOWEL DISPOSABLE) ×3 IMPLANT
TRAY CATH 16FR W/PLASTIC CATH (SET/KITS/TRAYS/PACK) ×1 IMPLANT
TRAY FOLEY CATH 14FRSI W/METER (CATHETERS) ×1 IMPLANT
TRAY PROCTOSCOPIC FIBER OPTIC (SET/KITS/TRAYS/PACK) IMPLANT
TUBE CONNECTING 12X1/4 (SUCTIONS) ×2 IMPLANT
UNDERPAD 30X30 INCONTINENT (UNDERPADS AND DIAPERS) IMPLANT
WATER STERILE IRR 1000ML POUR (IV SOLUTION) ×1 IMPLANT
YANKAUER SUCT BULB TIP NO VENT (SUCTIONS) ×2 IMPLANT

## 2015-04-26 NOTE — Progress Notes (Signed)
Pt extremely confused, keeps asking if he is at my house, then accuses me of trying to hurt him. PCA turned off.

## 2015-04-26 NOTE — Progress Notes (Signed)
General Surgery:  3 hours post op Stable and answers questions appropriately. denoes chest pain or dyspnea  sPO2 97%. HR 103, NSR Urine clear Labs to be drawn  Lungs: clear Abd:  Wound clean without bleeding. Stoma pink. Appropriately tender   Assess/Plan: Perforated diverticulitis. Stable post op [facility] spirometry DVT prophylaxis - lovenox to start later today Zosyn Mobilize as tol NPO except ice chips WOC consult  CAD, s/p CABG, s/p MI HTN Anal fistula DM without complication    Gordon Carlson M. Dalbert Batman, M.D., [facility] General and Minimally invasive Surgery Breast and Colorectal Surgery Office:   [phone] Pager:   [phone]

## 2015-04-26 NOTE — Transfer of Care (Signed)
Immediate Anesthesia Transfer of Care Note  Patient: [name] [name]  Procedure(s) Performed: Procedure(s): EXPLORATORY LAPAROTOMY PARTIAL COLECTOMY WITH COLOSTOMY AND HARTMANN'S (N/A)  Patient Location: PACU  Anesthesia Type:General  Level of Consciousness: sedated, patient cooperative and responds to stimulation  Airway & Oxygen Therapy: Patient Spontanous Breathing and Patient connected to nasal cannula oxygen  Post-op Assessment: Report given to RN, Post -op Vital signs reviewed and stable and Patient moving all extremities X 4  Post vital signs: Reviewed and stable  Last Vitals:  Filed Vitals:   04/26/15 0003 04/26/15 0030  BP: 128/76 108/68  Pulse: 112 109  Temp:    Resp: 24 25    Complications: No apparent anesthesia complications

## 2015-04-26 NOTE — Anesthesia Preprocedure Evaluation (Signed)
Anesthesia Evaluation  Patient identified by MRN, date of birth, ID band Patient awake    Reviewed: Allergy & Precautions, H&P , NPO status , Patient's Chart, lab work & pertinent test results  Airway Mallampati: III  TM Distance: >3 FB Neck ROM: Full    Dental no notable dental hx. (+) Teeth Intact, Dental Advisory Given   Pulmonary neg pulmonary ROS, former smoker,    Pulmonary exam normal breath sounds clear to auscultation       Cardiovascular hypertension, Pt. on medications and Pt. on home beta blockers + CAD, + Past MI, + CABG and +CHF   Rhythm:Regular Rate:Normal     Neuro/Psych negative neurological ROS  negative psych ROS   GI/Hepatic Neg liver ROS, GERD  Medicated and Controlled,  Endo/Other  diabetes, Type 2, Oral Hypoglycemic Agents  Renal/GU Renal InsufficiencyRenal disease  negative genitourinary   Musculoskeletal  (+) Arthritis , Osteoarthritis,    Abdominal   Peds  Hematology negative hematology ROS (+)   Anesthesia Other Findings   Reproductive/Obstetrics negative OB ROS                             Anesthesia Physical Anesthesia Plan  ASA: III and emergent  Anesthesia Plan: General   Post-op Pain Management:    Induction: Intravenous, Rapid sequence and Cricoid pressure planned  Airway Management Planned: Oral ETT  Additional Equipment:   Intra-op Plan:   Post-operative Plan: Extubation in OR and Possible Post-op intubation/ventilation  Informed Consent: I have reviewed the patients History and Physical, chart, labs and discussed the procedure including the risks, benefits and alternatives for the proposed anesthesia with the patient or authorized representative who has indicated his/her understanding and acceptance.   Dental advisory given  Plan Discussed with: CRNA  Anesthesia Plan Comments:         Anesthesia Quick Evaluation

## 2015-04-26 NOTE — Progress Notes (Signed)
CRITICAL VALUE ALERT  Critical value received:  MRSA PCR positive  Date of notification:  04/26/2015  Time of notification: [id number]  Critical value read back:Yes.    Nurse who received alert:  Leonidas Romberg, RN   MD notified (1st page):  Initiated protocol  Time of first page:    MD notified (2nd page):  Time of second page:  Responding MD:    Time MD responded:

## 2015-04-26 NOTE — H&P (Addendum)
[name] [name] is an 70 y.o. male.   Chief Complaint: abdominal pain HPI: [name] [name] developed acute onset of L abdominal pain earlier this afternoon. He says it was very severe. He denies previous episodes such as this. He has received pain medication in the emergency department but is still quite uncomfortable. Workup included laboratory studies showing leukocytosis of 18,700. CT scan of the abdomen and pelvis reveals perforated diverticulitis involving the distal descending colon. There is no abscess but there is considerable free intraperitoneal air. He is well known to our practice from previous cholecystectomy, appendectomy, and treatment of a fistula in ano. His wife reports he had a colonoscopy in 2014. He also has history of coronary artery disease and underwent coronary artery bypass grafting over 10 years ago. He takes an aspirin daily but no other blood thinners. His wife is a retired Financial trader that used to work at [employer].  Past Medical History  Diagnosis Date  . Arthritis   . Hypertension   . GERD (gastroesophageal reflux disease)   . Gout   . Anal fistula   . Myocardial infarction ([place])   . CAD (coronary artery disease)     a. s/p CABG 2001; b. LHC (3/09):  S-RCA, L-LAD, S-OM1/dCFX all patent, EF 55-60%;  c. myoview (12/12):  no ischemia, EF 68%;  d. Dob Echo (1/14):  + ECG changes but normal echo => med Rx cont'd  . Hyperlipidemia   . CHF (congestive heart failure) ([place])   . Diabetes mellitus without complication [facility])     Past Surgical History  Procedure Laterality Date  . Appendectomy    . Joint replacement      Right knee  . Cholecystectomy    . Coronary artery bypass graft      2001 LIMA LAD, SVG OM/Circ dista, SVG PDA.  Cath 2009 with patent grafts  . Treatment fistula anal    . Hip arthroplasty Right 03/01/2013    Procedure: ARTHROPLASTY BIPOLAR HIP;  Surgeon: Alta Corning, MD;  Location: [facility];  Service: Orthopedics;  Laterality: Right;    Family  History  Problem Relation Age of Onset  . Colon cancer Mother   . Heart disease Father   . Heart disease Sister   . Heart disease Brother   . Diabetes Sister   . Heart disease Brother   . Heart disease Brother    Social History:  reports that he has quit smoking. He has never used smokeless tobacco. He reports that he drinks about 21.6 oz of alcohol per week. He reports that he does not use illicit drugs.  Allergies:  Allergies  Allergen Reactions  . Ciprofloxacin Hcl Other (See Comments)    Causes pain in tendons, feels like muscle spasms.     (Not in a hospital admission)  Results for orders placed or performed during the hospital encounter of 04/25/15 (from the past 48 hour(s))  Lipase, blood     Status: None   Collection Time: 04/25/15  8:16 PM  Result Value Ref Range   Lipase 22 11 - 51 U/L  Comprehensive metabolic panel     Status: Abnormal   Collection Time: 04/25/15  8:16 PM  Result Value Ref Range   Sodium 138 135 - 145 mmol/L   Potassium 3.8 3.5 - 5.1 mmol/L   Chloride 98 (L) 101 - 111 mmol/L   CO2 28 22 - 32 mmol/L   Glucose, Bld 126 (H) 65 - 99 mg/dL   BUN 23 (  H) 6 - 20 mg/dL   Creatinine, Ser 1.61 (H) 0.61 - 1.24 mg/dL   Calcium 9.3 8.9 - 10.3 mg/dL   Total Protein 7.2 6.5 - 8.1 g/dL   Albumin 4.1 3.5 - 5.0 g/dL   AST 47 (H) 15 - 41 U/L   ALT 118 (H) 17 - 63 U/L   Alkaline Phosphatase 175 (H) 38 - 126 U/L   Total Bilirubin 0.9 0.3 - 1.2 mg/dL   GFR calc non Af Amer 42 (L) >60 mL/min   GFR calc Af Amer 48 (L) >60 mL/min    Comment: (NOTE) The eGFR has been calculated using the CKD EPI equation. This calculation has not been validated in all clinical situations. eGFR's persistently <60 mL/min signify possible Chronic Kidney Disease.    Anion gap 12 5 - 15  CBC     Status: Abnormal   Collection Time: 04/25/15  8:16 PM  Result Value Ref Range   WBC 18.7 (H) 4.0 - 10.5 K/uL   RBC 4.81 4.22 - 5.81 MIL/uL   Hemoglobin 13.3 13.0 - 17.0 g/dL   HCT 42.0  39.0 - 52.0 %   MCV 87.3 78.0 - 100.0 fL   MCH 27.7 26.0 - 34.0 pg   MCHC 31.7 30.0 - 36.0 g/dL   RDW 16.2 (H) 11.5 - 15.5 %   Platelets 260 150 - 400 K/uL  I-Stat Troponin, ED (not at [facility])     Status: None   Collection Time: 04/25/15  8:18 PM  Result Value Ref Range   Troponin i, poc 0.00 0.00 - 0.08 ng/mL   Comment 3            Comment: Due to the release kinetics of cTnI, a negative result within the first hours of the onset of symptoms does not rule out myocardial infarction with certainty. If myocardial infarction is still suspected, repeat the test at appropriate intervals.   Urinalysis, Routine w reflex microscopic (not at [facility])     Status: Abnormal   Collection Time: 04/25/15  9:50 PM  Result Value Ref Range   Color, Urine YELLOW YELLOW   APPearance CLEAR CLEAR   Specific Gravity, Urine 1.016 1.005 - 1.030   pH 6.0 5.0 - 8.0   Glucose, UA NEGATIVE NEGATIVE mg/dL   Hgb urine dipstick NEGATIVE NEGATIVE   Bilirubin Urine NEGATIVE NEGATIVE   Ketones, ur NEGATIVE NEGATIVE mg/dL   Protein, ur 100 (A) NEGATIVE mg/dL   Nitrite NEGATIVE NEGATIVE   Leukocytes, UA NEGATIVE NEGATIVE  Urine microscopic-add on     Status: Abnormal   Collection Time: 04/25/15  9:50 PM  Result Value Ref Range   Squamous Epithelial / LPF 0-5 (A) NONE SEEN   WBC, UA 0-5 0 - 5 WBC/hpf   RBC / HPF 0-5 0 - 5 RBC/hpf   Bacteria, UA RARE (A) NONE SEEN   Casts HYALINE CASTS (A) NEGATIVE  Lactic acid, plasma     Status: Abnormal   Collection Time: 04/25/15  9:55 PM  Result Value Ref Range   Lactic Acid, Venous 3.2 (HH) 0.5 - 2.0 mmol/L    Comment: CRITICAL RESULT CALLED TO, READ BACK BY AND VERIFIED WITH: HOUSAND,R RN 04/25/2015 2322 JORDANS    Ct Abdomen Pelvis W Contrast  04/25/2015  CLINICAL DATA:  Severe diffuse abdominal pain. EXAM: CT ABDOMEN AND PELVIS WITH CONTRAST TECHNIQUE: Multidetector CT imaging of the abdomen and pelvis was performed using the standard protocol following bolus  administration of intravenous contrast. CONTRAST:  95m  OMNIPAQUE IOHEXOL 300 MG/ML  SOLN COMPARISON:  12/04/2013 FINDINGS: Postoperative changes in the mediastinum. Atelectasis in the lung bases. Free abdominal air is demonstrated in the abdomen. There is diverticulosis of the sigmoid colon with pericolonic extraluminal gas and infiltration around the colon at the lower descending region consistent with perforated diverticulitis. Surgical absence of the gallbladder. Mild bile duct dilatation is likely physiologic in the postoperative state. Pancreas is atrophic. No focal liver lesions. Spleen size is normal. No adrenal gland nodules. Diffuse calcification of abdominal aorta without aneurysm. Bilateral renal cysts. Nonobstructing stones in both kidneys. No hydronephrosis. Inferior vena cava and retroperitoneal lymph nodes are unremarkable. Stomach, small bowel, and colon are not abnormally distended. No free fluid in the abdomen. Pelvis: Right hip arthroplasty causes streak artifact, limiting visualization of some areas. Prostate gland is not enlarged. Bladder wall is not thickened. Diverticulosis of sigmoid colon. Surgical absence of the appendix. Fat in the inguinal canals bilaterally. No free or loculated pelvic fluid collections. Degenerative changes throughout the lumbar spine. No destructive bone lesions. IMPRESSION: Perforated diverticulitis in the low descending colon with free intraperitoneal gas. Nonobstructing stones in both kidneys. Bilateral renal cysts. Surgical absence of the gallbladder with bile duct dilatation, likely physiologic. These results were called by telephone at the time of interpretation on 04/25/2015 at 11:51 pm to Dr. Doylene Canard , who verbally acknowledged these results. Electronically Signed   By: Lucienne Capers M.D.   On: 04/25/2015 23:53   Dg Chest Portable 1 View  04/25/2015  CLINICAL DATA:  Nausea and vomiting.  Abdominal pain. EXAM: PORTABLE CHEST 1 VIEW COMPARISON:  08/24/2014  FINDINGS: Patient is post median sternotomy. Lower lung volumes from prior exam. The heart is enlarged, accentuated by low lung volumes. Probable bibasilar atelectasis. No evidence of pulmonary edema, confluent airspace disease, pleural effusion or pneumothorax. IMPRESSION: Hypoventilatory chest.  Cardiomegaly, accentuated by technique. Electronically Signed   By: Jeb Levering M.D.   On: 04/25/2015 22:50   Dg Abd Portable 2v  04/25/2015  CLINICAL DATA:  Nausea vomiting and abdominal pain EXAM: PORTABLE ABDOMEN - 2 VIEW COMPARISON:  03/07/2014 FINDINGS: Images are limited by patient motion and portable technique. Nonobstructive bowel gas pattern.  No bowel thickening. Gas density under the diaphragm bilaterally could be free air versus bowel gas. If there is concern of acute abdominal pain, CT scanning of the abdomen pelvis may be helpful for further evaluation Lumbar dextroscoliosis.  Cholecystectomy. IMPRESSION: Normal bowel gas pattern. Study is limited by motion and portable technique. Cannot rule out free air.  CT if indicated clinically. Electronically Signed   By: Franchot Gallo M.D.   On: 04/25/2015 22:50    Review of Systems  Constitutional: Positive for malaise/fatigue.  HENT: Negative.   Eyes: Negative.   Respiratory: Negative for cough.   Cardiovascular: Negative for chest pain.  Gastrointestinal: Positive for abdominal pain. Negative for nausea, vomiting and diarrhea.  Genitourinary: Negative.   Musculoskeletal: Negative.   Skin: Negative.   Neurological: Negative.   Endo/Heme/Allergies: Negative.   Psychiatric/Behavioral: Negative.     Blood pressure 108/68, pulse 109, temperature 99 F (37.2 C), temperature source Oral, resp. rate 25, weight 103.42 kg (228 lb), SpO2 95 %. Physical Exam  Constitutional: He is oriented to person, place, and time. He appears well-developed and well-nourished. No distress.  HENT:  Head: Normocephalic and atraumatic.  Right Ear: External ear  normal.  Left Ear: External ear normal.  Nose: Nose normal.  Mouth/Throat: Oropharynx is clear and moist.  Eyes: EOM  are normal. Pupils are equal, round, and reactive to light.  Neck: Neck supple. No tracheal deviation present.  Cardiovascular: Normal rate and normal heart sounds.   Respiratory: Effort normal and breath sounds normal. No stridor. No respiratory distress. He has no wheezes. He has no rales.  Median sternotomy scar  GI: Soft. He exhibits no distension. There is tenderness. There is rebound and guarding.  Generalized tenderness, more severe tenderness left lower quadrant with guarding and peritoneal signs, hypo-active bowel sounds  Musculoskeletal: Normal range of motion.  Knee replacement scar right knee, scar right hip  Neurological: He is alert and oriented to person, place, and time. He exhibits normal muscle tone.  Skin: Skin is warm.  Psychiatric: He has a normal mood and affect.     Assessment/Plan Perforated diverticulitis involving the distal descending colon - significant free peritoneal air and peritonitis on exam. I have recommended IV Zosyn and emergent partial colectomy with colostomy. Procedure, risks, and benefits were discussed in detail with him, his wife, and his son. I answered their questions and discussed the expected postoperative course. The medical service has been consulted regarding his CAD and other medical problems.  Zenovia Jarred, MD 04/26/2015, 12:54 AM

## 2015-04-26 NOTE — Consult Note (Addendum)
WOC wound consult note Pt followed by surgical team for assessment and plan of care Reason for Consult: Consult requested for Vac application to abd Wound type: Full thickness midline abd post-op wound Measurement: 20X4X1.5cm Wound bed: Beefy red, interspersed with yellow Drainage (amount, consistency, odor) Small amt bloody drainage Periwound: Intact  Dressing procedure/placement/frequency: Applied one piece black foam to 168mm cont suction.  Pt medicated for pain prior to procedure and tolerated with mod amt discomfort.  Plan dressing change Mon.  WOC ostomy consult note Stoma type/location:  Pt had colostomy surgery performed on 3/10 to LLQ.  Current pouch is leaking behind the barrier. Stomal assessment/size:  Stoma red and viable, flush with skin level, 1 3/4 inches Peristomal assessment:  Intact skin surrounding Treatment options for stomal/peristomal skin:  Pt will need to use a barrier ring with next pouch change to maintain seal Output Scant amt blood-tinged drainage, no stool or flatus Ostomy pouching: 1pc. Education provided:  Demonstrated pouch change procedure.  Pt watched and asked questions.  Will continue teaching sessions when stable and out of ICU. Enrolled patient in [place] program: No Julien Girt MSN, [place], [place], [place], [name]

## 2015-04-26 NOTE — Progress Notes (Signed)
Utilization review completed. Javante Nilsson, RN, BSN. 

## 2015-04-26 NOTE — ED Notes (Signed)
MD at bedside. 

## 2015-04-26 NOTE — Anesthesia Procedure Notes (Signed)
Procedure Name: Intubation Date/Time: 04/26/2015 1:36 AM Performed by: Claris Che Pre-anesthesia Checklist: Patient identified, Emergency Drugs available, Suction available, Patient being monitored and Timeout performed Patient Re-evaluated:Patient Re-evaluated prior to inductionOxygen Delivery Method: Circle system utilized Preoxygenation: Pre-oxygenation with 100% oxygen Intubation Type: IV induction, Rapid sequence and Cricoid Pressure applied Laryngoscope Size: Mac and 3 Grade View: Grade I Tube type: Oral Tube size: 8.0 mm Number of attempts: 1 Airway Equipment and Method: Stylet Placement Confirmation: ETT inserted through vocal cords under direct vision,  positive ETCO2 and breath sounds checked- equal and bilateral Secured at: 24 cm Tube secured with: Tape Dental Injury: Teeth and Oropharynx as per pre-operative assessment

## 2015-04-26 NOTE — Anesthesia Postprocedure Evaluation (Signed)
Anesthesia Post Note  Patient: [name] [name]  Procedure(s) Performed: Procedure(s) (LRB): EXPLORATORY LAPAROTOMY PARTIAL COLECTOMY WITH COLOSTOMY AND HARTMANN'S (N/A)  Patient location during evaluation: PACU Anesthesia Type: General Level of consciousness: awake and alert Pain management: pain level not controlled Vital Signs Assessment: post-procedure vital signs reviewed and stable Respiratory status: spontaneous breathing, nonlabored ventilation, respiratory function stable and patient connected to face mask oxygen Cardiovascular status: blood pressure returned to baseline and stable Postop Assessment: no signs of nausea or vomiting Anesthetic complications: no    Last Vitals:  Filed Vitals:   04/26/15 0500 04/26/15 0600  BP: 161/90 167/93  Pulse: 106 101  Temp: 37.9 C   Resp: 20 18    Last Pain:  Filed Vitals:   04/26/15 0620  PainSc: 10-Worst pain ever                 Seneca Hoback,W. EDMOND

## 2015-04-26 NOTE — Op Note (Signed)
04/25/2015 - 04/26/2015  3:16 AM  PATIENT:  [name] [name]  70 y.o. male  PRE-OPERATIVE DIAGNOSIS:  Perforated diverticulitis  POST-OPERATIVE DIAGNOSIS:  Perforated diverticulitis  PROCEDURE:  Procedure(s): EXPLORATORY LAPAROTOMY, PARTIAL COLECTOMY WITH COLOSTOMY AND HARTMANN'S  SURGEON:  Surgeon(s): Georganna Skeans, MD  ASSISTANTS: none   ANESTHESIA:   general  EBL:  Total I/O In: 69 [I.V.:3600] Out: 650 [Urine:550; Blood:100]  BLOOD ADMINISTERED:none  DRAINS: none   SPECIMEN:  Excision  DISPOSITION OF SPECIMEN:  PATHOLOGY  COUNTS:  YES  DICTATION: .Dragon Dictation Findings: Perforated diverticulum distal descending colon  Procedure in detail: Informed consent was obtained. He received intravenous antibiotic. He was brought to the operating room and general endotracheal anesthesia was administered by the anesthesia staff. Foley catheter was placed by nursing. His abdomen was prepped and draped in a sterile fashion. Time out was performed. Lower midline incision was made. Subcutaneous tissues were dissected down revealing the anterior fascia. This was divided along the midline and the perineal cavity was entered under direct vision. Fascia was opened to the length of the incision. There was purulent fluid collection in the left lower quadrant and this was sent for cultures.. Exploration revealed an 8 mm perforation of a single diverticulum of the distal descending colon. Descending colon was mobilized from lateral peritoneal attachments. Inspection of the colon revealed a couple more diverticuli distal to the perforation. The colon was divided distal to those with GIA-75 stapler. Mesentery was then taken down using the LigaSure getting good hemostasis. The descending colon was then divided several centimeters proximal to the perforation. Specimen was marked for pathology and sent. The proximal descending colon was mobilized further from lateral peritoneal attachments to facilitate  colostomy placement. Circular incision was made in the left upper quadrant and the subcutaneous fat was removed. Cruciate incision was made in the fascia and an opening was made for the colostomy. The proximal descending colon was brought out through there. The abdomen was then copiously irrigated with multiple liters of warm saline. Bowel was returned to anatomic position. The distal colonic stump was marked with a Prolene suture. Fascia was closed with running #1 looped PDS and the subcutaneous tissues were irrigated and packed with wet-to-dry dressing. Colostomy was matured with 3-0 Vicryl. It was pink and a finger passed easily down the lumen past the fascia. Ostomy appliance was applied. All counts were correct. He tolerated the procedure well without apparent complication and was taken to recovery in stable condition with plans for admission to the stepdown unit. PATIENT DISPOSITION:  PACU - hemodynamically stable.   Delay start of Pharmacological VTE agent (>24hrs) due to surgical blood loss or risk of bleeding:  no  Georganna Skeans, MD, MPH, FACS Pager: [phone]  3/10/20173:16 AM

## 2015-04-26 NOTE — Consult Note (Signed)
[facility] Medical Consultation  [name] [name] [id number] DOB: 1945/07/16 DOA: 04/25/2015 PCP: Merrilee Seashore, MD   Requesting physician: [facility]- Dr. Dalbert Batman Date of consultation: 04/26/15 Reason for consultation: Management of medical problems  Impression/Recommendations  Perforated diverticulitis, s/p exp laparotomy, partial colectomy with colostomy and hartmann's early this am. On Zosyn   AKI, improving. Baseline Cr 0.98, 1.61 on admission down to 1.41 this am.  -am BMET  Type II diabetes. Stable On Metformin at home. CBGs at home twice weekly less than 110.  -Q 4 hours CBGs, SSI - sensitive. -Home metformin on hold  Hypertension. BP is elevated.  -Takes Lopressor at home , will give IV Metrolol until taking PO  Hypothyroidism. -give IV synthroid until taking PO  CAD remote MI / CABG. Remote CABG. Followed by PCP  Depression. Exacerbated by current medical events per wife -Will resume home effexor tomorrow if surgery allows PO  Anxiety. Takes Clonazepam 1mg  twice daily at home. Wife requests it be continued -Ativan 0.5mg  iv BID PRN while NPO then resume home Clonazepam when taking PO  Gerd.  -continue home IV PPI  Gout. Stable -resume Zyloprim when taking PO  We will followup again tomorrow. Please contact me if I can be of assistance in the meanwhile. Thank you for this consultation.  Chief Complaint: managment of medical problems  HPI:  Patient is a 70 yo male who developed sudden onset of constant lower abdominal pain last night. Diagnosed with perforated diverticulum in ED. No previous history of diverticulitis. Patient felt fine prior to last night. Patient is followed by Pain Management for chronic back problems. He requires narcotics at home on a regular basis. His last colonoscopy was in 2014 with [facility].   Review of Systems:  Constitutional: Denies chills, diaphoresis, appetite change and fatigue.  HEENT: Denies  photophobia, eye pain, redness, hearing loss, ear pain, congestion, sore throat, rhinorrhea, sneezing, mouth sores, trouble swallowing, neck pain, neck stiffness and tinnitus.  Respiratory: Denies SOB, DOE, cough, chest tightness, and wheezing.  Cardiovascular: Denies chest pain, palpitations and leg swelling.  Gastrointestinal: Denies nausea, vomiting, diarrhea, constipation, blood in stool and abdominal distention, ++ for abdominal pain Genitourinary: Denies dysuria, urgency, frequency, hematuria, flank pain and difficulty urinating.  Musculoskeletal: Denies myalgias, back pain, joint swelling, arthralgias and gait problem.  Skin: Denies pallor, rash and wound.  Neurological: Denies dizziness, seizures, syncope, weakness, light-headedness, numbness and headaches.  Hematological: Denies adenopathy. Easy bruising, personal or family bleeding history  Psychiatric/Behavioral: ++ for depression. Denies suicidal ideation, mood changes, confusion, nervousness, sleep disturbance and agitation    Past Medical History  Diagnosis Date  . Arthritis   . Hypertension   . GERD (gastroesophageal reflux disease)   . Gout   . Anal fistula   . Myocardial infarction ([place])   . CAD (coronary artery disease)     a. s/p CABG 2001; b. LHC (3/09):  S-RCA, L-LAD, S-OM1/dCFX all patent, EF 55-60%;  c. myoview (12/12):  no ischemia, EF 68%;  d. Dob Echo (1/14):  + ECG changes but normal echo => med Rx cont'd  . Hyperlipidemia   . CHF (congestive heart failure) ([place])   . Diabetes mellitus without complication [facility])    Past Surgical History  Procedure Laterality Date  . Appendectomy    . Joint replacement      Right knee  . Cholecystectomy    . Coronary artery bypass graft      2001 LIMA LAD, SVG OM/Circ dista, SVG PDA.  Cath 2009 with patent grafts  . Treatment fistula anal    . Hip arthroplasty Right 03/01/2013    Procedure: ARTHROPLASTY BIPOLAR HIP;  Surgeon: Alta Corning, MD;  Location: [facility] ORS;  Service:  Orthopedics;  Laterality: Right;   Social History:  reports that he has quit smoking. He has never used smokeless tobacco. He reports that he drinks about 21.6 oz of alcohol per week. He reports that he does not use illicit drugs.  Allergies  Allergen Reactions  . Ciprofloxacin Hcl Other (See Comments)    Causes pain in tendons, feels like muscle spasms.   Family History  Problem Relation Age of Onset  . Colon cancer Mother   . Heart disease Father   . Heart disease Sister   . Heart disease Brother   . Diabetes Sister   . Heart disease Brother   . Heart disease Brother     Prior to Admission medications   Medication Sig Start Date End Date Taking? Authorizing Provider  allopurinol (ZYLOPRIM) 300 MG tablet Take 300 mg by mouth every evening.    Yes Historical Provider, MD  Artificial Tear Ointment (DRY EYES OP) Place 1 drop into both eyes 2 (two) times daily as needed (dry eyes).   Yes Historical Provider, MD  aspirin EC 325 MG tablet Take 1 tablet (325 mg total) by mouth 2 (two) times daily after a meal. Take x 1 month post op. Patient taking differently: Take 325 mg by mouth daily with breakfast.  03/01/13  Yes Gary Fleet, PA-C  B Complex-C (B-COMPLEX WITH VITAMIN C) tablet Take 1 tablet by mouth daily with breakfast.    Yes Historical Provider, MD  calcium-vitamin D (OSCAL WITH D) 500-200 MG-UNIT per tablet Take 1 tablet by mouth daily with breakfast.   Yes Historical Provider, MD  cholecalciferol (VITAMIN D) 1000 UNITS tablet Take 2,000 Units by mouth daily.    Yes Historical Provider, MD  clonazePAM (KLONOPIN) 1 MG tablet Take 1 mg by mouth 2 (two) times daily.    Yes Historical Provider, MD  docusate sodium 100 MG CAPS Take 100 mg by mouth 2 (two) times daily as needed for mild constipation. Patient taking differently: Take 300 mg by mouth at bedtime.  03/03/13  Yes Nishant Dhungel, MD  fluticasone (FLONASE) 50 MCG/ACT nasal spray Place 1 spray into both nostrils daily as  needed. 04/13/15  Yes Historical Provider, MD  gabapentin (NEURONTIN) 300 MG capsule Take 900 mg by mouth at bedtime.  02/21/13  Yes Historical Provider, MD  HYDROcodone-acetaminophen (NORCO) 10-325 MG per tablet Take 1 tablet by mouth every 6 (six) hours as needed for moderate pain.   Yes Historical Provider, MD  hydrocortisone cream 1 % Apply 1 application topically daily as needed for itching (for feet).   Yes Historical Provider, MD  levothyroxine (SYNTHROID, LEVOTHROID) 75 MCG tablet Take 75 mcg by mouth every morning.  04/13/15  Yes Historical Provider, MD  MAGNESIUM OXIDE PO Take 500 mg by mouth daily with breakfast.    Yes Historical Provider, MD  metFORMIN (GLUCOPHAGE-XR) 500 MG 24 hr tablet Take 1,000 mg by mouth at bedtime. 04/24/15  Yes Historical Provider, MD  metoprolol (LOPRESSOR) 100 MG tablet Take 100 mg by mouth 2 (two) times daily. 04/24/15  Yes Historical Provider, MD  nitroGLYCERIN (NITROSTAT) 0.4 MG SL tablet Place 1 tablet (0.4 mg total) under the tongue every 5 (five) minutes as needed. 02/23/12  Yes Minus Breeding, MD  omeprazole (PRILOSEC) 40 MG capsule Take 40  mg by mouth daily. 04/13/15  Yes Historical Provider, MD  potassium gluconate 595 (99 K) MG TABS tablet Take 595 mg by mouth daily.   Yes Historical Provider, MD  Probiotic Product (PROBIOTIC DAILY PO) Take 1 capsule by mouth every morning.    Yes Historical Provider, MD  valsartan (DIOVAN) 80 MG tablet Take 80 mg by mouth daily with breakfast.   Yes Historical Provider, MD  venlafaxine XR (EFFEXOR-XR) 75 MG 24 hr capsule Take 225 mg by mouth at bedtime. 04/19/15  Yes Historical Provider, MD  furosemide (LASIX) 20 MG tablet Take 1 tablet (20 mg total) by mouth as needed. Patient not taking: Reported on 04/25/2015 02/23/12   Minus Breeding, MD   Physical Exam: Blood pressure 167/93, pulse 101, temperature 100.2 F (37.9 C), temperature source Oral, resp. rate 18, height 6' (1.829 m), weight 102.6 kg (226 lb 3.1 oz), SpO2 95  %. Filed Vitals:   04/26/15 0500 04/26/15 0600  BP: 161/90 167/93  Pulse: 106 101  Temp: 100.2 F (37.9 C)   Resp: 20 18    Constitutional: Vital signs reviewed. Patient is a well-developed and well-nourished white male in no acute distress and cooperative with exam. Alert and oriented x3.  Head: Normocephalic and atraumatic  Ear: normal hearing  Eyes: PER, conjunctivae normal, No scleral icterus.  Neck: Supple, Trachea midline normal ROM, No JVD Cardiovascular: RRR, S1 normal, S2 normal, no MRG, pulses symmetric and intact bilaterally  Pulmonary/Chest: CTAB, no wheezes, rales, or rhonchi  Abdominal: Soft, diffusely tender, hypoactive bowel sounds. Colostomy in place with thin bloody secretions. Laparotomy dressing in place.   Musculoskeletal: No joint deformities, erythema, or stiffness, ROM full and no nontender Ext: no edema and no cyanosis,   Hematology: no cervical adenopathy.  Neurological: A&O x3, Strength is normal and symmetric bilaterally, cranial nerve II-XII are grossly intact, no focal motor deficit, sensory intact to light touch bilaterally.  Skin: Warm, dry and intact. No rash, cyanosis, or clubbing.  Psychiatric: flat affect. speech and behavior is normal. Judgment and thought content normal. Cognition and memory are normal.   Labs on Admission:  Basic Metabolic Panel:  Recent Labs Lab 04/25/15 2016 04/26/15 0540  NA 138  --   K 3.8  --   CL 98*  --   CO2 28  --   GLUCOSE 126*  --   BUN 23*  --   CREATININE 1.61* 1.41*  CALCIUM 9.3  --    Liver Function Tests:  Recent Labs Lab 04/25/15 2016  AST 47*  ALT 118*  ALKPHOS 175*  BILITOT 0.9  PROT 7.2  ALBUMIN 4.1    Recent Labs Lab 04/25/15 2016  LIPASE 22    CBC:  Recent Labs Lab 04/25/15 2016 04/26/15 0540  WBC 18.7* 20.3*  HGB 13.3 11.9*  HCT 42.0 37.4*  MCV 87.3 86.6  PLT 260 222  CBG:  Recent Labs Lab 04/26/15 0319  GLUCAP 118*    Radiological Exams on Admission: Ct  Abdomen Pelvis W Contrast  04/25/2015  CLINICAL DATA:  Severe diffuse abdominal pain. EXAM: CT ABDOMEN AND PELVIS WITH CONTRAST TECHNIQUE: Multidetector CT imaging of the abdomen and pelvis was performed using the standard protocol following bolus administration of intravenous contrast. CONTRAST:  24mL OMNIPAQUE IOHEXOL 300 MG/ML  SOLN COMPARISON:  12/04/2013 FINDINGS: Postoperative changes in the mediastinum. Atelectasis in the lung bases. Free abdominal air is demonstrated in the abdomen. There is diverticulosis of the sigmoid colon with pericolonic extraluminal gas and infiltration around  the colon at the lower descending region consistent with perforated diverticulitis. Surgical absence of the gallbladder. Mild bile duct dilatation is likely physiologic in the postoperative state. Pancreas is atrophic. No focal liver lesions. Spleen size is normal. No adrenal gland nodules. Diffuse calcification of abdominal aorta without aneurysm. Bilateral renal cysts. Nonobstructing stones in both kidneys. No hydronephrosis. Inferior vena cava and retroperitoneal lymph nodes are unremarkable. Stomach, small bowel, and colon are not abnormally distended. No free fluid in the abdomen. Pelvis: Right hip arthroplasty causes streak artifact, limiting visualization of some areas. Prostate gland is not enlarged. Bladder wall is not thickened. Diverticulosis of sigmoid colon. Surgical absence of the appendix. Fat in the inguinal canals bilaterally. No free or loculated pelvic fluid collections. Degenerative changes throughout the lumbar spine. No destructive bone lesions. IMPRESSION: Perforated diverticulitis in the low descending colon with free intraperitoneal gas. Nonobstructing stones in both kidneys. Bilateral renal cysts. Surgical absence of the gallbladder with bile duct dilatation, likely physiologic. These results were called by telephone at the time of interpretation on 04/25/2015 at 11:51 pm to Dr. Doylene Canard , who verbally  acknowledged these results. Electronically Signed   By: Lucienne Capers M.D.   On: 04/25/2015 23:53   Dg Chest Port 1 View  04/26/2015  CLINICAL DATA:  Oxygen desaturation. EXAM: PORTABLE CHEST 1 VIEW COMPARISON:  04/25/2015 FINDINGS: Postoperative changes in the mediastinum. Shallow inspiration with elevation of the left hemidiaphragm. Atelectasis in the lung bases. Cardiac enlargement without significant vascular congestion. No edema or consolidation. No blunting of costophrenic angles. No pneumothorax. Similar appearance to previous study. IMPRESSION: Shallow inspiration with atelectasis in the lung bases. Cardiac enlargement. No vascular congestion or edema. Electronically Signed   By: Lucienne Capers M.D.   On: 04/26/2015 04:31    Time spent: 45 minutes  Tye Savoy [facility] Pager [phone]  If 7PM-7AM, please contact night-coverage [email or web address] Password [facility] 04/26/2015, 7:24 AM

## 2015-04-27 DIAGNOSIS — K5720 Diverticulitis of large intestine with perforation and abscess without bleeding: Secondary | ICD-10-CM | POA: Diagnosis present

## 2015-04-27 DIAGNOSIS — E038 Other specified hypothyroidism: Secondary | ICD-10-CM

## 2015-04-27 DIAGNOSIS — E118 Type 2 diabetes mellitus with unspecified complications: Secondary | ICD-10-CM

## 2015-04-27 DIAGNOSIS — I2510 Atherosclerotic heart disease of native coronary artery without angina pectoris: Secondary | ICD-10-CM | POA: Diagnosis present

## 2015-04-27 LAB — BASIC METABOLIC PANEL
ANION GAP: 9 (ref 5–15)
BUN: 13 mg/dL (ref 6–20)
CALCIUM: 8.8 mg/dL — AB (ref 8.9–10.3)
CO2: 27 mmol/L (ref 22–32)
CREATININE: 1.23 mg/dL (ref 0.61–1.24)
Chloride: 101 mmol/L (ref 101–111)
GFR, EST NON AFRICAN AMERICAN: 58 mL/min — AB (ref >60)
Glucose, Bld: 149 mg/dL — ABNORMAL HIGH (ref 65–99)
Potassium: 3.7 mmol/L (ref 3.5–5.1)
Sodium: 137 mmol/L (ref 135–145)

## 2015-04-27 LAB — CBC
HEMATOCRIT: 35 % — AB (ref 39.0–52.0)
Hemoglobin: 11 g/dL — ABNORMAL LOW (ref 13.0–17.0)
MCH: 27.4 pg (ref 26.0–34.0)
MCHC: 31.4 g/dL (ref 30.0–36.0)
MCV: 87.3 fL (ref 78.0–100.0)
PLATELETS: 210 10*3/uL (ref 150–400)
RBC: 4.01 MIL/uL — ABNORMAL LOW (ref 4.22–5.81)
RDW: 16.7 % — AB (ref 11.5–15.5)
WBC: 15.8 10*3/uL — AB (ref 4.0–10.5)

## 2015-04-27 LAB — GLUCOSE, CAPILLARY
GLUCOSE-CAPILLARY: 103 mg/dL — AB (ref 65–99)
GLUCOSE-CAPILLARY: 120 mg/dL — AB (ref 65–99)
GLUCOSE-CAPILLARY: 110 mg/dL — AB (ref 65–99)
Glucose-Capillary: 114 mg/dL — ABNORMAL HIGH (ref 65–99)

## 2015-04-27 LAB — RAPID URINE DRUG SCREEN, HOSP PERFORMED
Amphetamines: NOT DETECTED
Barbiturates: NOT DETECTED
Benzodiazepines: POSITIVE — AB
Cocaine: NOT DETECTED
OPIATES: POSITIVE — AB
Tetrahydrocannabinol: NOT DETECTED

## 2015-04-27 LAB — POCT I-STAT 3, ART BLOOD GAS (G3+)
Acid-Base Excess: 1 mmol/L (ref 0.0–2.0)
Bicarbonate: 23.3 mEq/L (ref 20.0–24.0)
O2 SAT: 91 %
PCO2 ART: 30.4 mmHg — AB (ref 35.0–45.0)
PH ART: 7.493 — AB (ref 7.350–7.450)
Patient temperature: 97.9
TCO2: 24 mmol/L (ref 0–100)
pO2, Arterial: 53 mmHg — ABNORMAL LOW (ref 80.0–100.0)

## 2015-04-27 MED ORDER — METOPROLOL TARTRATE 1 MG/ML IV SOLN
7.5000 mg | Freq: Four times a day (QID) | INTRAVENOUS | Status: DC
  Administered 2015-04-28 – 2015-04-30 (×12): 7.5 mg via INTRAVENOUS
  Filled 2015-04-27 (×12): qty 10

## 2015-04-27 MED ORDER — FENTANYL CITRATE (PF) 100 MCG/2ML IJ SOLN
50.0000 ug | INTRAMUSCULAR | Status: DC | PRN
  Administered 2015-04-27 (×2): 100 ug via INTRAVENOUS
  Administered 2015-04-27: 50 ug via INTRAVENOUS
  Administered 2015-04-28 – 2015-05-03 (×11): 100 ug via INTRAVENOUS
  Filled 2015-04-27 (×17): qty 2

## 2015-04-27 MED ORDER — LORAZEPAM 2 MG/ML IJ SOLN
2.0000 mg | INTRAMUSCULAR | Status: DC | PRN
  Administered 2015-04-27 (×4): 3 mg via INTRAVENOUS
  Administered 2015-04-27 (×2): 2 mg via INTRAVENOUS
  Administered 2015-04-28 (×3): 3 mg via INTRAVENOUS
  Administered 2015-04-28 (×2): 2 mg via INTRAVENOUS
  Administered 2015-04-28 (×3): 3 mg via INTRAVENOUS
  Administered 2015-04-29 – 2015-05-03 (×18): 2 mg via INTRAVENOUS
  Filled 2015-04-27: qty 1
  Filled 2015-04-27: qty 2
  Filled 2015-04-27 (×2): qty 1
  Filled 2015-04-27: qty 2
  Filled 2015-04-27 (×2): qty 1
  Filled 2015-04-27: qty 2
  Filled 2015-04-27 (×2): qty 1
  Filled 2015-04-27: qty 2
  Filled 2015-04-27 (×7): qty 1
  Filled 2015-04-27 (×2): qty 2
  Filled 2015-04-27: qty 1
  Filled 2015-04-27: qty 2
  Filled 2015-04-27 (×3): qty 1
  Filled 2015-04-27 (×2): qty 2
  Filled 2015-04-27 (×2): qty 1
  Filled 2015-04-27 (×2): qty 2
  Filled 2015-04-27: qty 1

## 2015-04-27 MED ORDER — HALOPERIDOL LACTATE 5 MG/ML IJ SOLN: 5.0000 mg | Freq: Once | INTRAMUSCULAR | Status: DC

## 2015-04-27 MED ORDER — CLONAZEPAM 1 MG PO TABS
1.0000 mg | ORAL_TABLET | Freq: Two times a day (BID) | ORAL | Status: DC
  Administered 2015-04-27 – 2015-04-29 (×4): 1 mg via ORAL
  Filled 2015-04-27 (×4): qty 1

## 2015-04-27 MED ORDER — FOLIC ACID 1 MG PO TABS
1.0000 mg | ORAL_TABLET | Freq: Every day | ORAL | Status: DC
  Administered 2015-04-28 – 2015-05-06 (×9): 1 mg via ORAL
  Filled 2015-04-27 (×9): qty 1

## 2015-04-27 MED ORDER — VITAMIN B-1 100 MG PO TABS
100.0000 mg | ORAL_TABLET | Freq: Every day | ORAL | Status: DC
  Administered 2015-04-27 – 2015-05-06 (×10): 100 mg via ORAL
  Filled 2015-04-27 (×9): qty 1

## 2015-04-27 MED ORDER — HALOPERIDOL LACTATE 5 MG/ML IJ SOLN
2.0000 mg | Freq: Once | INTRAMUSCULAR | Status: AC
  Administered 2015-04-27: 2 mg via INTRAVENOUS
  Filled 2015-04-27: qty 1

## 2015-04-27 MED ORDER — VENLAFAXINE HCL ER 75 MG PO CP24
225.0000 mg | ORAL_CAPSULE | Freq: Every day | ORAL | Status: DC
  Administered 2015-04-28 – 2015-05-05 (×8): 225 mg via ORAL
  Filled 2015-04-27: qty 3
  Filled 2015-04-27: qty 1
  Filled 2015-04-27 (×2): qty 3
  Filled 2015-04-27: qty 1
  Filled 2015-04-27: qty 3
  Filled 2015-04-27: qty 1
  Filled 2015-04-27 (×2): qty 3

## 2015-04-27 MED ORDER — HALOPERIDOL LACTATE 5 MG/ML IJ SOLN
5.0000 mg | Freq: Once | INTRAMUSCULAR | Status: AC
  Administered 2015-04-27: 5 mg via INTRAVENOUS
  Filled 2015-04-27: qty 1

## 2015-04-27 MED ORDER — LORAZEPAM 2 MG/ML IJ SOLN
1.0000 mg | Freq: Once | INTRAMUSCULAR | Status: AC
  Administered 2015-04-27: 1 mg via INTRAVENOUS
  Filled 2015-04-27: qty 1

## 2015-04-27 MED ORDER — HALOPERIDOL LACTATE 5 MG/ML IJ SOLN
INTRAMUSCULAR | Status: AC
Start: 2015-04-27 — End: 2015-04-27
  Administered 2015-04-27: 22:00:00
  Filled 2015-04-27: qty 1

## 2015-04-27 NOTE — Consult Note (Signed)
[facility] Medical Consultation  [name] [name] [id number] DOB: 1945-03-31 DOA: 04/25/2015 PCP: Merrilee Seashore, MD   Requesting physician: Pilar Jarvis CCS Date of consultation: 04/26/2015 Reason for consultation: Multiple medical problems  Impression/Recommendations Active Problems:   Hypertension   DM (diabetes mellitus), type 2 ([place])   AKI (acute kidney injury) ([place])   Diverticulitis   Perforated diverticulum of large intestine   Diverticulitis of colon with perforation   Depression   GERD (gastroesophageal reflux disease)   Hypothyroidism   Diverticulitis of large intestine with perforation without bleeding   Type 2 diabetes mellitus with complication ([place])   CAD in native artery   Perforated diverticulitis, s/p exp laparotomy, partial colectomy with colostomy and hartmann's early this am. On Zosyn  EtOH Abuse -Continue CIWA protocol  -Normal saline 141ml/hr  Respiratory alkalosis non-anion gap -Hyperventilation Secondary to patient's agitation, EtOH withdrawal, Pain  -Restart anxiety medication -Start CIWA -UDS pending  AKI, ( Baseline Cr 0.98) -Resolving, continue monitor  Type II diabetes. With complication -Stable On Metformin at home. CBGs at home twice weekly less than 110.  -Sensitive SSI   Hypertension.  -BP is elevated.  -Crease Metoprolol IV 7.5 mg QID  Hypothyroidism. -give IV synthroid until taking PO  CAD remote MI / CABG. Remote CABG. Followed by PCP  Depression. - Exacerbated by current medical events per wife -Restart Effexor 225 mg daily  Anxiety.  -Restart Clonazepam 1mg  BID -Ativan 0.5mg  iv BID PRN while NPO then resume home Clonazepam when taking PO  Gerd.  -continue home IV PPI  Gout.  -Stable    I will followup again tomorrow. Please contact me if I can be of assistance in the meanwhile. Thank you for this consultation.  Chief Complaint: Multiple medical problems  HPI: 70 yo WM PMHx Active EtOH  Abuse, HTN, MI, CAD native artery, Chronic Diastolic CHF,   Developed sudden onset of constant lower abdominal pain last night. Diagnosed with perforated diverticulum in ED. No previous history of diverticulitis. Patient felt fine prior to last night. Patient is followed by Pain Management for chronic back problems. He requires narcotics at home on a regular basis. His last colonoscopy was in 2014 with [facility].   3/11 A/O 0 (had just received Ativan), Tmax 38C  Review of symptoms Unable to obtain patient sedated     Consultants:   Procedure/Significant Events: 3/10 EXPLORATORY LAPAROTOMY, PARTIAL COLECTOMY WITH COLOSTOMY AND HARTMANN'S    Culture    Antibiotics: Zosyn 3/9>>  DVT prophylaxis: SCD   Past Medical History  Diagnosis Date  . Arthritis   . Hypertension   . GERD (gastroesophageal reflux disease)   . Gout   . Anal fistula   . Myocardial infarction ([place])   . CAD (coronary artery disease)     a. s/p CABG 2001; b. LHC (3/09):  S-RCA, L-LAD, S-OM1/dCFX all patent, EF 55-60%;  c. myoview (12/12):  no ischemia, EF 68%;  d. Dob Echo (1/14):  + ECG changes but normal echo => med Rx cont'd  . Hyperlipidemia   . CHF (congestive heart failure) ([place])   . Diabetes mellitus without complication [facility])    Past Surgical History  Procedure Laterality Date  . Appendectomy    . Joint replacement      Right knee  . Cholecystectomy    . Coronary artery bypass graft      2001 LIMA LAD, SVG OM/Circ dista, SVG PDA.  Cath 2009 with patent grafts  . Treatment fistula anal    .  Hip arthroplasty Right 03/01/2013    Procedure: ARTHROPLASTY BIPOLAR HIP;  Surgeon: Alta Corning, MD;  Location: [facility] ORS;  Service: Orthopedics;  Laterality: Right;   Social History:  reports that he has quit smoking. He has never used smokeless tobacco. He reports that he drinks about 21.6 oz of alcohol per week. He reports that he does not use illicit drugs.  Allergies  Allergen Reactions  .  Ciprofloxacin Hcl Other (See Comments)    Causes pain in tendons, feels like muscle spasms.   Family History  Problem Relation Age of Onset  . Colon cancer Mother   . Heart disease Father   . Heart disease Sister   . Heart disease Brother   . Diabetes Sister   . Heart disease Brother   . Heart disease Brother     Prior to Admission medications   Medication Sig Start Date End Date Taking? Authorizing Provider  allopurinol (ZYLOPRIM) 300 MG tablet Take 300 mg by mouth every evening.    Yes Historical Provider, MD  Artificial Tear Ointment (DRY EYES OP) Place 1 drop into both eyes 2 (two) times daily as needed (dry eyes).   Yes Historical Provider, MD  aspirin EC 325 MG tablet Take 1 tablet (325 mg total) by mouth 2 (two) times daily after a meal. Take x 1 month post op. Patient taking differently: Take 325 mg by mouth daily with breakfast.  03/01/13  Yes Gary Fleet, PA-C  B Complex-C (B-COMPLEX WITH VITAMIN C) tablet Take 1 tablet by mouth daily with breakfast.    Yes Historical Provider, MD  calcium-vitamin D (OSCAL WITH D) 500-200 MG-UNIT per tablet Take 1 tablet by mouth daily with breakfast.   Yes Historical Provider, MD  cholecalciferol (VITAMIN D) 1000 UNITS tablet Take 2,000 Units by mouth daily.    Yes Historical Provider, MD  clonazePAM (KLONOPIN) 1 MG tablet Take 1 mg by mouth 2 (two) times daily.    Yes Historical Provider, MD  docusate sodium 100 MG CAPS Take 100 mg by mouth 2 (two) times daily as needed for mild constipation. Patient taking differently: Take 300 mg by mouth at bedtime.  03/03/13  Yes Nishant Dhungel, MD  fluticasone (FLONASE) 50 MCG/ACT nasal spray Place 1 spray into both nostrils daily as needed. 04/13/15  Yes Historical Provider, MD  gabapentin (NEURONTIN) 300 MG capsule Take 900 mg by mouth at bedtime.  02/21/13  Yes Historical Provider, MD  HYDROcodone-acetaminophen (NORCO) 10-325 MG per tablet Take 1 tablet by mouth every 6 (six) hours as needed for moderate  pain.   Yes Historical Provider, MD  hydrocortisone cream 1 % Apply 1 application topically daily as needed for itching (for feet).   Yes Historical Provider, MD  levothyroxine (SYNTHROID, LEVOTHROID) 75 MCG tablet Take 75 mcg by mouth every morning.  04/13/15  Yes Historical Provider, MD  MAGNESIUM OXIDE PO Take 500 mg by mouth daily with breakfast.    Yes Historical Provider, MD  metFORMIN (GLUCOPHAGE-XR) 500 MG 24 hr tablet Take 1,000 mg by mouth at bedtime. 04/24/15  Yes Historical Provider, MD  metoprolol (LOPRESSOR) 100 MG tablet Take 100 mg by mouth 2 (two) times daily. 04/24/15  Yes Historical Provider, MD  nitroGLYCERIN (NITROSTAT) 0.4 MG SL tablet Place 1 tablet (0.4 mg total) under the tongue every 5 (five) minutes as needed. 02/23/12  Yes Minus Breeding, MD  omeprazole (PRILOSEC) 40 MG capsule Take 40 mg by mouth daily. 04/13/15  Yes Historical Provider, MD  potassium gluconate 595 (  99 K) MG TABS tablet Take 595 mg by mouth daily.   Yes Historical Provider, MD  Probiotic Product (PROBIOTIC DAILY PO) Take 1 capsule by mouth every morning.    Yes Historical Provider, MD  valsartan (DIOVAN) 80 MG tablet Take 80 mg by mouth daily with breakfast.   Yes Historical Provider, MD  venlafaxine XR (EFFEXOR-XR) 75 MG 24 hr capsule Take 225 mg by mouth at bedtime. 04/19/15  Yes Historical Provider, MD  azithromycin (ZITHROMAX) 500 MG tablet Take 1 tablet (500 mg total) by mouth daily. Take on 08/28/14 Patient not taking: Reported on 04/25/2015 08/28/14   Orson Eva, MD  cefdinir (OMNICEF) 300 MG capsule Take 1 capsule (300 mg total) by mouth 2 (two) times daily. Start 08/28/14 Patient not taking: Reported on 04/25/2015 08/28/14   Orson Eva, MD  furosemide (LASIX) 20 MG tablet Take 1 tablet (20 mg total) by mouth as needed. Patient not taking: Reported on 04/25/2015 02/23/12   Minus Breeding, MD  metoprolol succinate (TOPROL-XL) 100 MG 24 hr tablet Take 1 tablet (100 mg total) by mouth daily. Patient not taking: Reported  on 04/25/2015 02/23/12   Minus Breeding, MD   Physical Exam: Blood pressure 155/52, pulse 95, temperature 97.6 F (36.4 C), temperature source Oral, resp. rate 23, height 6' (1.829 m), weight 100 kg (220 lb 7.4 oz), SpO2 98 %. Filed Vitals:   04/27/15 1400 04/27/15 1500 04/27/15 1706 04/27/15 1900  BP:   160/90 155/52  Pulse: 103 83 92 95  Temp:   97.9 F (36.6 C) 97.6 F (36.4 C)  TempSrc:   Oral Oral  Resp:   27 23  Height:      Weight:      SpO2:   100% 98%     General: A/O 0, sedated, No acute respiratory distress Eyes: Negative headache, eye pain, double vision,negative scleral hemorrhage ENT: Negative Runny nose, negative ear pain, negative gingival bleeding, Neck:  Negative scars, masses, torticollis, lymphadenopathy, JVD Lungs: Clear to auscultation bilaterally without wheezes or crackles Cardiovascular: Tachycardic, Regular rhythm without murmur gallop or rub normal S1 and S2 Abdomen:negative abdominal pain, nondistended, positive soft, bowel sounds, no rebound, no ascites, no appreciable mass Extremities: No significant cyanosis, clubbing, or edema bilateral lower extremities Psychiatric:  Unable to obtain altered mental status Neurologic:  Unable to obtain altered mental status   Labs on Admission:  Basic Metabolic Panel:  Recent Labs Lab 04/25/15 2016 04/26/15 0540 04/27/15 0317  NA 138  --  137  K 3.8  --  3.7  CL 98*  --  101  CO2 28  --  27  GLUCOSE 126*  --  149*  BUN 23*  --  13  CREATININE 1.61* 1.41* 1.23  CALCIUM 9.3  --  8.8*   Liver Function Tests:  Recent Labs Lab 04/25/15 2016  AST 47*  ALT 118*  ALKPHOS 175*  BILITOT 0.9  PROT 7.2  ALBUMIN 4.1    Recent Labs Lab 04/25/15 2016  LIPASE 22   No results for input(s): AMMONIA in the last 168 hours. CBC:  Recent Labs Lab 04/25/15 2016 04/26/15 0540 04/27/15 0317  WBC 18.7* 20.3* 15.8*  HGB 13.3 11.9* 11.0*  HCT 42.0 37.4* 35.0*  MCV 87.3 86.6 87.3  PLT 260 222 210    Cardiac Enzymes: No results for input(s): CKTOTAL, CKMB, CKMBINDEX, TROPONINI in the last 168 hours. BNP: Invalid input(s): POCBNP CBG:  Recent Labs Lab 04/26/15 2339 04/27/15 0314 04/27/15 0749 04/27/15 1131 04/27/15 1705  GLUCAP 120* 114* 103* 120* 110*    Radiological Exams on Admission: Ct Abdomen Pelvis W Contrast  04/25/2015  CLINICAL DATA:  Severe diffuse abdominal pain. EXAM: CT ABDOMEN AND PELVIS WITH CONTRAST TECHNIQUE: Multidetector CT imaging of the abdomen and pelvis was performed using the standard protocol following bolus administration of intravenous contrast. CONTRAST:  47mL OMNIPAQUE IOHEXOL 300 MG/ML  SOLN COMPARISON:  12/04/2013 FINDINGS: Postoperative changes in the mediastinum. Atelectasis in the lung bases. Free abdominal air is demonstrated in the abdomen. There is diverticulosis of the sigmoid colon with pericolonic extraluminal gas and infiltration around the colon at the lower descending region consistent with perforated diverticulitis. Surgical absence of the gallbladder. Mild bile duct dilatation is likely physiologic in the postoperative state. Pancreas is atrophic. No focal liver lesions. Spleen size is normal. No adrenal gland nodules. Diffuse calcification of abdominal aorta without aneurysm. Bilateral renal cysts. Nonobstructing stones in both kidneys. No hydronephrosis. Inferior vena cava and retroperitoneal lymph nodes are unremarkable. Stomach, small bowel, and colon are not abnormally distended. No free fluid in the abdomen. Pelvis: Right hip arthroplasty causes streak artifact, limiting visualization of some areas. Prostate gland is not enlarged. Bladder wall is not thickened. Diverticulosis of sigmoid colon. Surgical absence of the appendix. Fat in the inguinal canals bilaterally. No free or loculated pelvic fluid collections. Degenerative changes throughout the lumbar spine. No destructive bone lesions. IMPRESSION: Perforated diverticulitis in the low  descending colon with free intraperitoneal gas. Nonobstructing stones in both kidneys. Bilateral renal cysts. Surgical absence of the gallbladder with bile duct dilatation, likely physiologic. These results were called by telephone at the time of interpretation on 04/25/2015 at 11:51 pm to Dr. Doylene Canard , who verbally acknowledged these results. Electronically Signed   By: Lucienne Capers M.D.   On: 04/25/2015 23:53   Dg Chest Port 1 View  04/26/2015  CLINICAL DATA:  Oxygen desaturation. EXAM: PORTABLE CHEST 1 VIEW COMPARISON:  04/25/2015 FINDINGS: Postoperative changes in the mediastinum. Shallow inspiration with elevation of the left hemidiaphragm. Atelectasis in the lung bases. Cardiac enlargement without significant vascular congestion. No edema or consolidation. No blunting of costophrenic angles. No pneumothorax. Similar appearance to previous study. IMPRESSION: Shallow inspiration with atelectasis in the lung bases. Cardiac enlargement. No vascular congestion or edema. Electronically Signed   By: Lucienne Capers M.D.   On: 04/26/2015 04:31   Dg Chest Portable 1 View  04/25/2015  CLINICAL DATA:  Nausea and vomiting.  Abdominal pain. EXAM: PORTABLE CHEST 1 VIEW COMPARISON:  08/24/2014 FINDINGS: Patient is post median sternotomy. Lower lung volumes from prior exam. The heart is enlarged, accentuated by low lung volumes. Probable bibasilar atelectasis. No evidence of pulmonary edema, confluent airspace disease, pleural effusion or pneumothorax. IMPRESSION: Hypoventilatory chest.  Cardiomegaly, accentuated by technique. Electronically Signed   By: Jeb Levering M.D.   On: 04/25/2015 22:50   Dg Abd Portable 2v  04/25/2015  CLINICAL DATA:  Nausea vomiting and abdominal pain EXAM: PORTABLE ABDOMEN - 2 VIEW COMPARISON:  03/07/2014 FINDINGS: Images are limited by patient motion and portable technique. Nonobstructive bowel gas pattern.  No bowel thickening. Gas density under the diaphragm bilaterally could be  free air versus bowel gas. If there is concern of acute abdominal pain, CT scanning of the abdomen pelvis may be helpful for further evaluation Lumbar dextroscoliosis.  Cholecystectomy. IMPRESSION: Normal bowel gas pattern. Study is limited by motion and portable technique. Cannot rule out free air.  CT if indicated clinically. Electronically Signed   By: Juanda Crumble  Carlis Abbott M.D.   On: 04/25/2015 22:50    EKG: N/A  Care during the described time interval was provided by me .  I have reviewed this patient's available data, including medical history, events of note, physical examination, and all test results as part of my evaluation. I have personally reviewed and interpreted all radiology studies.  Time spent: 35 minutes  Allie Bossier [facility] Pager [phone]  If 7PM-7AM, please contact night-coverage [email or web address] Password [email or web address] 04/27/2015, 8:10 PM

## 2015-04-27 NOTE — Progress Notes (Signed)
[facility] Progress Note 1 Day Post-Op  Subjective: Patient very agitated and delirious this AM, started about midnight.   Dilaudid PCA has been off since that time.  He is completely disoriented.  Objective: Vital signs in last 24 hours: Temp:  [97.8 F (36.6 C)-98.1 F (36.7 C)] 97.9 F (36.6 C) (03/11 0700) Pulse Rate:  [86-106] 86 (03/11 0700) Resp:  [15-33] 33 (03/11 0700) BP: (134-170)/(74-121) 160/81 mmHg (03/11 0700) SpO2:  [92 %-98 %] 94 % (03/11 0700) FiO2 (%):  [95 %] 95 % (03/10 1600) Weight:  [100 kg (220 lb 7.4 oz)] 100 kg (220 lb 7.4 oz) (03/11 0300)    Intake/Output from previous day: 03/10 0701 - 03/11 0700 In: 2273.2 [I.V.:2210.7; IV Piggyback:62.5] Out: 1865 [id number] Intake/Output this shift:    General: Agitated, delirious, disoriented.  Not sure of the etiology  Lungs: Clear to ausculation  Abd: Soft, tender diffusely, some bowel sounds  Extremities: No clinical signs or symptoms of DVT  Neuro: Disoriented and delirious.  Has history of heavy ETOH use.  Lab Results:  @LABLAST2 (wbc:2,hgb:2,hct:2,plt:2) BMET ) Recent Labs  04/25/15 2016 04/26/15 0540 04/27/15 0317  NA 138  --  137  K 3.8  --  3.7  CL 98*  --  101  CO2 28  --  27  GLUCOSE 126*  --  149*  BUN 23*  --  13  CREATININE 1.61* 1.41* 1.23  CALCIUM 9.3  --  8.8*   PT/INR No results for input(s): LABPROT, INR in the last 72 hours. ABG No results for input(s): PHART, HCO3 in the last 72 hours.  Invalid input(s): PCO2, PO2  Studies/Results: Ct Abdomen Pelvis W Contrast  04/25/2015  CLINICAL DATA:  Severe diffuse abdominal pain. EXAM: CT ABDOMEN AND PELVIS WITH CONTRAST TECHNIQUE: Multidetector CT imaging of the abdomen and pelvis was performed using the standard protocol following bolus administration of intravenous contrast. CONTRAST:  35mL OMNIPAQUE IOHEXOL 300 MG/ML  SOLN COMPARISON:  12/04/2013 FINDINGS: Postoperative changes in the mediastinum. Atelectasis in the lung  bases. Free abdominal air is demonstrated in the abdomen. There is diverticulosis of the sigmoid colon with pericolonic extraluminal gas and infiltration around the colon at the lower descending region consistent with perforated diverticulitis. Surgical absence of the gallbladder. Mild bile duct dilatation is likely physiologic in the postoperative state. Pancreas is atrophic. No focal liver lesions. Spleen size is normal. No adrenal gland nodules. Diffuse calcification of abdominal aorta without aneurysm. Bilateral renal cysts. Nonobstructing stones in both kidneys. No hydronephrosis. Inferior vena cava and retroperitoneal lymph nodes are unremarkable. Stomach, small bowel, and colon are not abnormally distended. No free fluid in the abdomen. Pelvis: Right hip arthroplasty causes streak artifact, limiting visualization of some areas. Prostate gland is not enlarged. Bladder wall is not thickened. Diverticulosis of sigmoid colon. Surgical absence of the appendix. Fat in the inguinal canals bilaterally. No free or loculated pelvic fluid collections. Degenerative changes throughout the lumbar spine. No destructive bone lesions. IMPRESSION: Perforated diverticulitis in the low descending colon with free intraperitoneal gas. Nonobstructing stones in both kidneys. Bilateral renal cysts. Surgical absence of the gallbladder with bile duct dilatation, likely physiologic. These results were called by telephone at the time of interpretation on 04/25/2015 at 11:51 pm to Dr. Doylene Canard , who verbally acknowledged these results. Electronically Signed   By: Lucienne Capers M.D.   On: 04/25/2015 23:53   Dg Chest Port 1 View  04/26/2015  CLINICAL DATA:  Oxygen desaturation. EXAM: PORTABLE CHEST 1 VIEW COMPARISON:  04/25/2015 FINDINGS: Postoperative changes in the mediastinum. Shallow inspiration with elevation of the left hemidiaphragm. Atelectasis in the lung bases. Cardiac enlargement without significant vascular congestion. No  edema or consolidation. No blunting of costophrenic angles. No pneumothorax. Similar appearance to previous study. IMPRESSION: Shallow inspiration with atelectasis in the lung bases. Cardiac enlargement. No vascular congestion or edema. Electronically Signed   By: Lucienne Capers M.D.   On: 04/26/2015 04:31   Dg Chest Portable 1 View  04/25/2015  CLINICAL DATA:  Nausea and vomiting.  Abdominal pain. EXAM: PORTABLE CHEST 1 VIEW COMPARISON:  08/24/2014 FINDINGS: Patient is post median sternotomy. Lower lung volumes from prior exam. The heart is enlarged, accentuated by low lung volumes. Probable bibasilar atelectasis. No evidence of pulmonary edema, confluent airspace disease, pleural effusion or pneumothorax. IMPRESSION: Hypoventilatory chest.  Cardiomegaly, accentuated by technique. Electronically Signed   By: Jeb Levering M.D.   On: 04/25/2015 22:50   Dg Abd Portable 2v  04/25/2015  CLINICAL DATA:  Nausea vomiting and abdominal pain EXAM: PORTABLE ABDOMEN - 2 VIEW COMPARISON:  03/07/2014 FINDINGS: Images are limited by patient motion and portable technique. Nonobstructive bowel gas pattern.  No bowel thickening. Gas density under the diaphragm bilaterally could be free air versus bowel gas. If there is concern of acute abdominal pain, CT scanning of the abdomen pelvis may be helpful for further evaluation Lumbar dextroscoliosis.  Cholecystectomy. IMPRESSION: Normal bowel gas pattern. Study is limited by motion and portable technique. Cannot rule out free air.  CT if indicated clinically. Electronically Signed   By: Franchot Gallo M.D.   On: 04/25/2015 22:50    Anti-infectives: Anti-infectives    Start     Dose/Rate Route Frequency Ordered Stop   04/26/15 0600  piperacillin-tazobactam (ZOSYN) IVPB 3.375 g     3.375 g 12.5 mL/hr over 240 Minutes Intravenous 3 times per day 04/26/15 0330     04/25/15 2145  piperacillin-tazobactam (ZOSYN) IVPB 3.375 g     3.375 g 12.5 mL/hr over 240 Minutes  Intravenous  Once 04/25/15 2130 04/26/15 0218      Assessment/Plan: s/p Procedure(s): EXPLORATORY LAPAROTOMY PARTIAL COLECTOMY WITH COLOSTOMY AND HARTMANN'S Continue foley due to delirium and strict I&O's  CIWA withdrawal protocol  LOS: 1 day   Kathryne Eriksson. Dahlia Bailiff, MD, FACS [phone] [facility] Surgery 04/27/2015

## 2015-04-28 DIAGNOSIS — R4589 Other symptoms and signs involving emotional state: Secondary | ICD-10-CM

## 2015-04-28 DIAGNOSIS — R451 Restlessness and agitation: Secondary | ICD-10-CM | POA: Diagnosis present

## 2015-04-28 DIAGNOSIS — K5730 Diverticulosis of large intestine without perforation or abscess without bleeding: Secondary | ICD-10-CM

## 2015-04-28 DIAGNOSIS — R109 Unspecified abdominal pain: Secondary | ICD-10-CM | POA: Diagnosis present

## 2015-04-28 DIAGNOSIS — R1084 Generalized abdominal pain: Secondary | ICD-10-CM

## 2015-04-28 LAB — GLUCOSE, CAPILLARY
GLUCOSE-CAPILLARY: 119 mg/dL — AB (ref 65–99)
GLUCOSE-CAPILLARY: 85 mg/dL (ref 65–99)
GLUCOSE-CAPILLARY: 90 mg/dL (ref 65–99)
GLUCOSE-CAPILLARY: 92 mg/dL (ref 65–99)
GLUCOSE-CAPILLARY: 94 mg/dL (ref 65–99)
Glucose-Capillary: 111 mg/dL — ABNORMAL HIGH (ref 65–99)
Glucose-Capillary: 112 mg/dL — ABNORMAL HIGH (ref 65–99)
Glucose-Capillary: 131 mg/dL — ABNORMAL HIGH (ref 65–99)

## 2015-04-28 LAB — COMPREHENSIVE METABOLIC PANEL
ALBUMIN: 2.7 g/dL — AB (ref 3.5–5.0)
ALT: 45 U/L (ref 17–63)
ANION GAP: 9 (ref 5–15)
AST: 21 U/L (ref 15–41)
Alkaline Phosphatase: 84 U/L (ref 38–126)
BUN: 13 mg/dL (ref 6–20)
CO2: 27 mmol/L (ref 22–32)
Calcium: 8.6 mg/dL — ABNORMAL LOW (ref 8.9–10.3)
Chloride: 103 mmol/L (ref 101–111)
Creatinine, Ser: 1.18 mg/dL (ref 0.61–1.24)
GFR calc Af Amer: >60 mL/min (ref >60)
GFR calc non Af Amer: >60 mL/min (ref >60)
GLUCOSE: 110 mg/dL — AB (ref 65–99)
POTASSIUM: 3.6 mmol/L (ref 3.5–5.1)
SODIUM: 139 mmol/L (ref 135–145)
Total Bilirubin: 0.6 mg/dL (ref 0.3–1.2)
Total Protein: 5.6 g/dL — ABNORMAL LOW (ref 6.5–8.1)

## 2015-04-28 LAB — CBC WITH DIFFERENTIAL/PLATELET
Basophils Absolute: 0 10*3/uL (ref 0.0–0.1)
Basophils Relative: 0 %
Eosinophils Absolute: 0.2 10*3/uL (ref 0.0–0.7)
Eosinophils Relative: 2 %
HEMATOCRIT: 34.1 % — AB (ref 39.0–52.0)
HEMOGLOBIN: 10.8 g/dL — AB (ref 13.0–17.0)
LYMPHS ABS: 1.8 10*3/uL (ref 0.7–4.0)
LYMPHS PCT: 17 %
MCH: 27.5 pg (ref 26.0–34.0)
MCHC: 31.7 g/dL (ref 30.0–36.0)
MCV: 86.8 fL (ref 78.0–100.0)
MONOS PCT: 6 %
Monocytes Absolute: 0.6 10*3/uL (ref 0.1–1.0)
NEUTROS ABS: 8.3 10*3/uL — AB (ref 1.7–7.7)
NEUTROS PCT: 75 %
Platelets: 218 10*3/uL (ref 150–400)
RBC: 3.93 MIL/uL — ABNORMAL LOW (ref 4.22–5.81)
RDW: 16.5 % — ABNORMAL HIGH (ref 11.5–15.5)
WBC: 10.9 10*3/uL — ABNORMAL HIGH (ref 4.0–10.5)

## 2015-04-28 LAB — LIPID PANEL
CHOL/HDL RATIO: 4.2 ratio
CHOLESTEROL: 121 mg/dL (ref 0–200)
HDL: 29 mg/dL — ABNORMAL LOW (ref >40)
LDL CALC: 57 mg/dL (ref 0–99)
Triglycerides: 173 mg/dL — ABNORMAL HIGH (ref <150)
VLDL: 35 mg/dL (ref 0–40)

## 2015-04-28 LAB — POCT I-STAT 3, ART BLOOD GAS (G3+)
ACID-BASE DEFICIT: 1 mmol/L (ref 0.0–2.0)
Bicarbonate: 24.4 mEq/L — ABNORMAL HIGH (ref 20.0–24.0)
O2 Saturation: 96 %
PH ART: 7.379 (ref 7.350–7.450)
PO2 ART: 85 mmHg (ref 80.0–100.0)
TCO2: 26 mmol/L (ref 0–100)
pCO2 arterial: 41.3 mmHg (ref 35.0–45.0)

## 2015-04-28 LAB — ANAEROBIC CULTURE

## 2015-04-28 LAB — BODY FLUID CULTURE

## 2015-04-28 LAB — MAGNESIUM: Magnesium: 1.7 mg/dL (ref 1.7–2.4)

## 2015-04-28 LAB — TSH: TSH: 0.444 u[IU]/mL (ref 0.350–4.500)

## 2015-04-28 MED ORDER — HALOPERIDOL LACTATE 5 MG/ML IJ SOLN
7.0000 mg | Freq: Four times a day (QID) | INTRAMUSCULAR | Status: DC
  Administered 2015-04-28 – 2015-04-30 (×9): 7 mg via INTRAVENOUS
  Filled 2015-04-28 (×9): qty 2

## 2015-04-28 MED ORDER — HALOPERIDOL LACTATE 5 MG/ML IJ SOLN
5.0000 mg | Freq: Once | INTRAMUSCULAR | Status: AC
  Administered 2015-04-28: 5 mg via INTRAVENOUS
  Filled 2015-04-28: qty 1

## 2015-04-28 MED ORDER — HALOPERIDOL LACTATE 5 MG/ML IJ SOLN
5.0000 mg | Freq: Four times a day (QID) | INTRAMUSCULAR | Status: DC | PRN
  Administered 2015-04-28: 5 mg via INTRAVENOUS
  Filled 2015-04-28 (×2): qty 1

## 2015-04-28 MED ORDER — HALOPERIDOL LACTATE 5 MG/ML IJ SOLN: 5.0000 mg | Freq: Four times a day (QID) | INTRAMUSCULAR | Status: DC

## 2015-04-28 NOTE — Progress Notes (Signed)
Colostomy care done bag leaking.Stoma intact red not s/s of infection noted.

## 2015-04-28 NOTE — Progress Notes (Signed)
2 Days Post-Op  Subjective: Pt confused and combative  Still abusive but better on CIWA protocol  Objective: Vital signs in last 24 hours: Temp:  [97.6 F (36.4 C)-100.4 F (38 C)] 98.4 F (36.9 C) (03/12 0748) Pulse Rate:  [70-111] 79 (03/12 0748) Resp:  [20-34] 27 (03/12 0748) BP: (120-160)/(52-91) 120/72 mmHg (03/12 0748) SpO2:  [85 %-100 %] 97 % (03/12 0748)    Intake/Output from previous day: 03/11 0701 - 03/12 0700 In: 2365 [P.O.:240; I.V.:2025; IV Piggyback:100] Out: 2250 [Urine:2250] Intake/Output this shift:    Cardio: regular rate and rhythm, S1, S2 normal, no murmur, click, rub or gallop Neurologic: Motor: normal Incision/Wound: vac in place   Ostomy pink and viable  Confused and combative    Moves all four extremities   Lab Results:   Recent Labs  04/27/15 0317 04/28/15 0529  WBC 15.8* 10.9*  HGB 11.0* 10.8*  HCT 35.0* 34.1*  PLT 210 218   BMET  Recent Labs  04/27/15 0317 04/28/15 0529  NA 137 139  K 3.7 3.6  CL 101 103  CO2 27 27  GLUCOSE 149* 110*  BUN 13 13  CREATININE 1.23 1.18  CALCIUM 8.8* 8.6*   PT/INR No results for input(s): LABPROT, INR in the last 72 hours. ABG  Recent Labs  04/27/15 0924  PHART 7.493*  HCO3 23.3    Studies/Results: No results found.  Anti-infectives: Anti-infectives    Start     Dose/Rate Route Frequency Ordered Stop   04/26/15 0600  piperacillin-tazobactam (ZOSYN) IVPB 3.375 g     3.375 g 12.5 mL/hr over 240 Minutes Intravenous 3 times per day 04/26/15 0330     04/25/15 2145  piperacillin-tazobactam (ZOSYN) IVPB 3.375 g     3.375 g 12.5 mL/hr over 240 Minutes Intravenous  Once 04/25/15 2130 04/26/15 0218      Assessment/Plan: s/p Procedure(s): EXPLORATORY LAPAROTOMY PARTIAL COLECTOMY WITH COLOSTOMY AND HARTMANN'S (N/A) ETOH withdraw  CIWA AND haldol for symptoms less combative today  Can have sips with meds  Can advance diet once more awake  Patient Active Problem List   Diagnosis Date  Noted  . Diverticulitis of large intestine with perforation without bleeding   . Type 2 diabetes mellitus with complication ([place])   . CAD in native artery   . Diverticulitis 04/26/2015  . Perforated diverticulum of large intestine 04/26/2015  . Diverticulitis of colon with perforation 04/26/2015  . Depression 04/26/2015  . GERD (gastroesophageal reflux disease) 04/26/2015  . Hypothyroidism 04/26/2015  . Acute encephalopathy 08/25/2014  . Acute on chronic renal failure ([place]) 08/25/2014  . Blood poisoning ([place])   . Elevated troponin   . CAP (community acquired pneumonia) 08/24/2014  . Sepsis ([place]) 08/24/2014  . Hypokalemia 08/24/2014  . AKI (acute kidney injury) ([place]) 08/24/2014  . Alcohol abuse 08/24/2014  . DM (diabetes mellitus), type 2 ([place]) 02/28/2013  . Abnormal EKG 02/28/2013  . [place] 11/05/2011  . Hyperlipidemia 02/06/2011  . Insomnia 02/06/2011  . Hypertension   . CAD (coronary artery disease)     LOS: 2 days    [name] [name] A. 04/28/2015

## 2015-04-28 NOTE — Consult Note (Signed)
[facility] Medical Consultation  [name] [name] [id number] DOB: August 02, 1945 DOA: 04/25/2015 PCP: Merrilee Seashore, MD   Requesting physician: Pilar Jarvis CCS Date of consultation: 04/26/2015 Reason for consultation: Multiple medical problems  Impression/Recommendations Active Problems:   Hypertension   DM (diabetes mellitus), type 2 ([name] [name])   AKI (acute kidney injury) ([name] [name])   Diverticulitis   Perforated diverticulum of large intestine   Diverticulitis of colon with perforation   Depression   GERD (gastroesophageal reflux disease)   Hypothyroidism   Diverticulitis of large intestine with perforation without bleeding   Type 2 diabetes mellitus with complication (HCC)   CAD in native artery   Abdominal pain   Agitation   Threatening to others   Perforated diverticulitis,  -s/p exp laparotomy, partial colectomy with colostomy and hartmann's - Zosyn per surgery  EtOH Abuse -Continue CIWA protocol  -Normal saline 136ml/hr  Respiratory alkalosis non-anion gap -Hyperventilation Secondary to patient's agitation, EtOH withdrawal, Pain  -Restart anxiety medication -Continue CIWA -UDS; positive for opiates and benzodiazepine both of which patient is receiving  AKI, ( Baseline Cr 0.98) -Resolving, continue monitor  Type II diabetes. With complication -Stable On Metformin at home. CBGs at home twice weekly less than 110.  -Sensitive SSI   Hypertension.  -BP is elevated.  -Metoprolol IV 7.5 mg QID  Hypothyroidism. -IV synthroid 37.5 g daily   CAD remote MI / CABG.  -Remote CABG. Followed by PCP  Depression. - Exacerbated by current medical events per wife - Effexor 225 mg daily  Anxiety.  -Clonazepam 1mg  BID  Agitation/Threatening to others  -Haldol 7 mg QID  Gerd.  -continue home IV PPI  Gout.  -Stable    I will followup again tomorrow. Please contact me if I can be of assistance in the meanwhile. Thank you for this consultation.  Chief  Complaint: Multiple medical problems  HPI: 70 yo WM PMHx Active EtOH Abuse, HTN, MI, CAD native artery, Chronic Diastolic CHF,   Developed sudden onset of constant lower abdominal pain last night. Diagnosed with perforated diverticulum in ED. No previous history of diverticulitis. Patient felt fine prior to last night. Patient is followed by Pain Management for chronic back problems. He requires narcotics at home on a regular basis. His last colonoscopy was in 2014 with [name].   3/12 A/O 2 (does not know when, why), Tmax 38C. Patient very belligerent and verbally abusive toward staff. Patient has threatened several staff members with harm.   Review of symptoms Unable to obtain patient sedated     Consultants:   Procedure/Significant Events: 3/10 EXPLORATORY LAPAROTOMY, PARTIAL COLECTOMY WITH COLOSTOMY AND HARTMANN'S    Culture    Antibiotics: Zosyn 3/9>>  DVT prophylaxis: SCD   Past Medical History  Diagnosis Date  . Arthritis   . Hypertension   . GERD (gastroesophageal reflux disease)   . Gout   . Anal fistula   . Myocardial infarction ([name] [name])   . CAD (coronary artery disease)     a. s/p CABG 2001; b. LHC (3/09):  S-RCA, L-LAD, S-OM1/dCFX all patent, EF 55-60%;  c. myoview (12/12):  no ischemia, EF 68%;  d. Dob Echo (1/14):  + ECG changes but normal echo => med Rx cont'd  . Hyperlipidemia   . CHF (congestive heart failure) ([name] [name])   . Diabetes mellitus without complication [name])    Past Surgical History  Procedure Laterality Date  . Appendectomy    . Joint replacement      Right knee  . Cholecystectomy    .  Coronary artery bypass graft      2001 LIMA LAD, SVG OM/Circ dista, SVG PDA.  Cath 2009 with patent grafts  . Treatment fistula anal    . Hip arthroplasty Right 03/01/2013    Procedure: ARTHROPLASTY BIPOLAR HIP;  Surgeon: Alta Corning, MD;  Location: [facility];  Service: Orthopedics;  Laterality: Right;   Social History:  reports that he has quit smoking. He  has never used smokeless tobacco. He reports that he drinks about 21.6 oz of alcohol per week. He reports that he does not use illicit drugs.  Allergies  Allergen Reactions  . Ciprofloxacin Hcl Other (See Comments)    Causes pain in tendons, feels like muscle spasms.   Family History  Problem Relation Age of Onset  . Colon cancer Mother   . Heart disease Father   . Heart disease Sister   . Heart disease Brother   . Diabetes Sister   . Heart disease Brother   . Heart disease Brother     Prior to Admission medications   Medication Sig Start Date End Date Taking? Authorizing Provider  allopurinol (ZYLOPRIM) 300 MG tablet Take 300 mg by mouth every evening.    Yes Historical Provider, MD  Artificial Tear Ointment (DRY EYES OP) Place 1 drop into both eyes 2 (two) times daily as needed (dry eyes).   Yes Historical Provider, MD  aspirin EC 325 MG tablet Take 1 tablet (325 mg total) by mouth 2 (two) times daily after a meal. Take x 1 month post op. Patient taking differently: Take 325 mg by mouth daily with breakfast.  03/01/13  Yes Gary Fleet, PA-C  B Complex-C (B-COMPLEX WITH VITAMIN C) tablet Take 1 tablet by mouth daily with breakfast.    Yes Historical Provider, MD  calcium-vitamin D (OSCAL WITH D) 500-200 MG-UNIT per tablet Take 1 tablet by mouth daily with breakfast.   Yes Historical Provider, MD  cholecalciferol (VITAMIN D) 1000 UNITS tablet Take 2,000 Units by mouth daily.    Yes Historical Provider, MD  clonazePAM (KLONOPIN) 1 MG tablet Take 1 mg by mouth 2 (two) times daily.    Yes Historical Provider, MD  docusate sodium 100 MG CAPS Take 100 mg by mouth 2 (two) times daily as needed for mild constipation. Patient taking differently: Take 300 mg by mouth at bedtime.  03/03/13  Yes Nishant Dhungel, MD  fluticasone (FLONASE) 50 MCG/ACT nasal spray Place 1 spray into both nostrils daily as needed. 04/13/15  Yes Historical Provider, MD  gabapentin (NEURONTIN) 300 MG capsule Take 900 mg  by mouth at bedtime.  02/21/13  Yes Historical Provider, MD  HYDROcodone-acetaminophen (NORCO) 10-325 MG per tablet Take 1 tablet by mouth every 6 (six) hours as needed for moderate pain.   Yes Historical Provider, MD  hydrocortisone cream 1 % Apply 1 application topically daily as needed for itching (for feet).   Yes Historical Provider, MD  levothyroxine (SYNTHROID, LEVOTHROID) 75 MCG tablet Take 75 mcg by mouth every morning.  04/13/15  Yes Historical Provider, MD  MAGNESIUM OXIDE PO Take 500 mg by mouth daily with breakfast.    Yes Historical Provider, MD  metFORMIN (GLUCOPHAGE-XR) 500 MG 24 hr tablet Take 1,000 mg by mouth at bedtime. 04/24/15  Yes Historical Provider, MD  metoprolol (LOPRESSOR) 100 MG tablet Take 100 mg by mouth 2 (two) times daily. 04/24/15  Yes Historical Provider, MD  nitroGLYCERIN (NITROSTAT) 0.4 MG SL tablet Place 1 tablet (0.4 mg total) under the tongue every 5 (  five) minutes as needed. 02/23/12  Yes Minus Breeding, MD  omeprazole (PRILOSEC) 40 MG capsule Take 40 mg by mouth daily. 04/13/15  Yes Historical Provider, MD  potassium gluconate 595 (99 K) MG TABS tablet Take 595 mg by mouth daily.   Yes Historical Provider, MD  Probiotic Product (PROBIOTIC DAILY PO) Take 1 capsule by mouth every morning.    Yes Historical Provider, MD  valsartan (DIOVAN) 80 MG tablet Take 80 mg by mouth daily with breakfast.   Yes Historical Provider, MD  venlafaxine XR (EFFEXOR-XR) 75 MG 24 hr capsule Take 225 mg by mouth at bedtime. 04/19/15  Yes Historical Provider, MD  azithromycin (ZITHROMAX) 500 MG tablet Take 1 tablet (500 mg total) by mouth daily. Take on 08/28/14 Patient not taking: Reported on 04/25/2015 08/28/14   Orson Eva, MD  cefdinir (OMNICEF) 300 MG capsule Take 1 capsule (300 mg total) by mouth 2 (two) times daily. Start 08/28/14 Patient not taking: Reported on 04/25/2015 08/28/14   Orson Eva, MD  furosemide (LASIX) 20 MG tablet Take 1 tablet (20 mg total) by mouth as needed. Patient not  taking: Reported on 04/25/2015 02/23/12   Minus Breeding, MD  metoprolol succinate (TOPROL-XL) 100 MG 24 hr tablet Take 1 tablet (100 mg total) by mouth daily. Patient not taking: Reported on 04/25/2015 02/23/12   Minus Breeding, MD   Physical Exam: Blood pressure 150/98, pulse 90, temperature 97.5 F (36.4 C), temperature source Oral, resp. rate 19, height 6' (1.829 m), weight 100 kg (220 lb 7.4 oz), SpO2 100 %. Filed Vitals:   04/28/15 0748 04/28/15 1153 04/28/15 1633 04/28/15 1928  BP: 120/72 129/79 152/92 150/98  Pulse: 79 78 96 90  Temp: 98.4 F (36.9 C) 98.2 F (36.8 C) 97.2 F (36.2 C) 97.5 F (36.4 C)  TempSrc: Oral Oral Oral Oral  Resp: 27 28 36 19  Height:      Weight:      SpO2: 97% 99% 97% 100%     General:  A/O 2 (does not know when, why), belligerent, hostile toward staff No acute respiratory distress Eyes: Negative headache, eye pain, double vision,negative scleral hemorrhage ENT: Negative Runny nose, negative ear pain, negative gingival bleeding, Neck:  Negative scars, masses, torticollis, lymphadenopathy, JVD Lungs: Clear to auscultation bilaterally without wheezes or crackles Cardiovascular: Tachycardic, Regular rhythm without murmur gallop or rub normal S1 and S2 Abdomen:negative abdominal pain, nondistended, positive soft, bowel sounds, no rebound, no ascites, no appreciable mass Extremities: No significant cyanosis, clubbing, or edema bilateral lower extremities Psychiatric:  Unable to obtain altered mental status Neurologic:  Unable to obtain altered mental status   Labs on Admission:  Basic Metabolic Panel:  Recent Labs Lab 04/25/15 2016 04/26/15 0540 04/27/15 0317 04/28/15 0529  NA 138  --  137 139  K 3.8  --  3.7 3.6  CL 98*  --  101 103  CO2 28  --  27 27  GLUCOSE 126*  --  149* 110*  BUN 23*  --  13 13  CREATININE 1.61* 1.41* 1.23 1.18  CALCIUM 9.3  --  8.8* 8.6*  MG  --   --   --  1.7   Liver Function Tests:  Recent Labs Lab  04/25/15 2016 04/28/15 0529  AST 47* 21  ALT 118* 45  ALKPHOS 175* 84  BILITOT 0.9 0.6  PROT 7.2 5.6*  ALBUMIN 4.1 2.7*    Recent Labs Lab 04/25/15 2016  LIPASE 22   No results for input(s): AMMONIA  in the last 168 hours. CBC:  Recent Labs Lab 04/25/15 2016 04/26/15 0540 04/27/15 0317 04/28/15 0529  WBC 18.7* 20.3* 15.8* 10.9*  NEUTROABS  --   --   --  8.3*  HGB 13.3 11.9* 11.0* 10.8*  HCT 42.0 37.4* 35.0* 34.1*  MCV 87.3 86.6 87.3 86.8  PLT 260 222 210 218   Cardiac Enzymes: No results for input(s): CKTOTAL, CKMB, CKMBINDEX, TROPONINI in the last 168 hours. BNP: Invalid input(s): POCBNP CBG:  Recent Labs Lab 04/28/15 0258 04/28/15 0745 04/28/15 1149 04/28/15 1625 04/28/15 1923  GLUCAP 111* 112* 92 94 85    Radiological Exams on Admission: No results found.  EKG: N/A  Care during the described time interval was provided by me .  I have reviewed this patient's available data, including medical history, events of note, physical examination, and all test results as part of my evaluation. I have personally reviewed and interpreted all radiology studies.  Time spent: 35 minutes  Allie Bossier [facility] Pager [phone]  If 7PM-7AM, please contact night-coverage [email or web address] Password [name] 04/28/2015, 9:18 PM

## 2015-04-28 NOTE — Progress Notes (Signed)
Pt. Was extremely agitated most of the night.  Pt. Made verbal and physical threats against himself, his family, and myself.  MD made aware and haldol 5 mg was given twice. Pt. Is detoxing and is probable cause of agitation. Will continue to monitor.

## 2015-04-29 ENCOUNTER — Encounter (HOSPITAL_COMMUNITY): Payer: Self-pay | Admitting: General Surgery

## 2015-04-29 DIAGNOSIS — F10231 Alcohol dependence with withdrawal delirium: Secondary | ICD-10-CM

## 2015-04-29 LAB — COMPREHENSIVE METABOLIC PANEL
ALT: 38 U/L (ref 17–63)
ANION GAP: 11 (ref 5–15)
AST: 19 U/L (ref 15–41)
Albumin: 2.8 g/dL — ABNORMAL LOW (ref 3.5–5.0)
Alkaline Phosphatase: 81 U/L (ref 38–126)
BUN: 15 mg/dL (ref 6–20)
CALCIUM: 8.9 mg/dL (ref 8.9–10.3)
CHLORIDE: 104 mmol/L (ref 101–111)
CO2: 21 mmol/L — AB (ref 22–32)
Creatinine, Ser: 0.96 mg/dL (ref 0.61–1.24)
GFR calc non Af Amer: >60 mL/min (ref >60)
Glucose, Bld: 103 mg/dL — ABNORMAL HIGH (ref 65–99)
Potassium: 3.3 mmol/L — ABNORMAL LOW (ref 3.5–5.1)
SODIUM: 136 mmol/L (ref 135–145)
Total Bilirubin: 0.8 mg/dL (ref 0.3–1.2)
Total Protein: 6.3 g/dL — ABNORMAL LOW (ref 6.5–8.1)

## 2015-04-29 LAB — CBC WITH DIFFERENTIAL/PLATELET
Basophils Absolute: 0 10*3/uL (ref 0.0–0.1)
Basophils Relative: 0 %
Eosinophils Absolute: 0.3 10*3/uL (ref 0.0–0.7)
Eosinophils Relative: 3 %
HCT: 36.8 % — ABNORMAL LOW (ref 39.0–52.0)
HEMOGLOBIN: 11.8 g/dL — AB (ref 13.0–17.0)
LYMPHS ABS: 2.1 10*3/uL (ref 0.7–4.0)
LYMPHS PCT: 21 %
MCH: 27.8 pg (ref 26.0–34.0)
MCHC: 32.1 g/dL (ref 30.0–36.0)
MCV: 86.6 fL (ref 78.0–100.0)
MONOS PCT: 7 %
Monocytes Absolute: 0.7 10*3/uL (ref 0.1–1.0)
NEUTROS PCT: 69 %
Neutro Abs: 7 10*3/uL (ref 1.7–7.7)
Platelets: 255 10*3/uL (ref 150–400)
RBC: 4.25 MIL/uL (ref 4.22–5.81)
RDW: 16.1 % — ABNORMAL HIGH (ref 11.5–15.5)
WBC: 10.1 10*3/uL (ref 4.0–10.5)

## 2015-04-29 LAB — GLUCOSE, CAPILLARY
GLUCOSE-CAPILLARY: 116 mg/dL — AB (ref 65–99)
GLUCOSE-CAPILLARY: 112 mg/dL — AB (ref 65–99)
Glucose-Capillary: 96 mg/dL (ref 65–99)
Glucose-Capillary: 89 mg/dL (ref 65–99)
Glucose-Capillary: 107 mg/dL — ABNORMAL HIGH (ref 65–99)

## 2015-04-29 LAB — HEMOGLOBIN A1C
HEMOGLOBIN A1C: 6.1 % — AB (ref 4.8–5.6)
MEAN PLASMA GLUCOSE: 128 mg/dL

## 2015-04-29 LAB — VITAMIN B12: VITAMIN B 12: 338 pg/mL (ref 180–914)

## 2015-04-29 LAB — MAGNESIUM: MAGNESIUM: 1.7 mg/dL (ref 1.7–2.4)

## 2015-04-29 LAB — AMMONIA: AMMONIA: 56 umol/L — AB (ref 9–35)

## 2015-04-29 LAB — FOLATE: FOLATE: 38.8 ng/mL (ref >5.9)

## 2015-04-29 MED ORDER — SODIUM CHLORIDE 0.9 % IV SOLN
1000.0000 mL | INTRAVENOUS | Status: DC
  Administered 2015-05-01 – 2015-05-05 (×7): 1000 mL via INTRAVENOUS

## 2015-04-29 MED ORDER — CYANOCOBALAMIN 1000 MCG/ML IJ SOLN
1000.0000 ug | Freq: Every day | INTRAMUSCULAR | Status: AC
  Administered 2015-04-29 – 2015-05-01 (×3): 1000 ug via SUBCUTANEOUS
  Filled 2015-04-29 (×5): qty 1

## 2015-04-29 MED ORDER — POTASSIUM CHLORIDE 10 MEQ/100ML IV SOLN
10.0000 meq | INTRAVENOUS | Status: AC
  Administered 2015-04-29 (×4): 10 meq via INTRAVENOUS
  Filled 2015-04-29 (×4): qty 100

## 2015-04-29 NOTE — Consult Note (Signed)
[facility] TEAM 1 - Stepdown/ICU TEAM CONSULT F/U NOTE  [name] [name] [id number] DOB: June 04, 1945 DOA: 04/25/2015 PCP: Merrilee Seashore, MD  Admit HPI / Brief Narrative: 70 yo M Hx active EtOH Abuse, HTN, MI, CAD, and Chronic Diastolic CHF who developed the sudden onset of constant lower abdominal pain and was diagnosed with perforated diverticulum in the ED.    Patient is followed by Pain Management for chronic back problems. He requires narcotics at home on a regular basis. His last colonoscopy was in 2014 with [facility].   HPI/Subjective: Pt is alert but confused.  He is attempting to pull out his IV and take off his gown.  He is in no acute resp distress, and does not appear to be in pain.    Recommendations/Plan:  Perforated diverticulum s/p exp laparotomy, partial colectomy with colostomy and hartmann's - care per primary service ([facility])  EtOH Abuse - acute delirium - EtOH withdrawal Agitation persists - continue CIWA protocol - check 123456, folic acid, RPR, and ammonia - cont supplemental haldol - if unable to control w/ current protocol may require escalation to precedex  AKI baseline Cr 0.98 - renal fxn has returned to baseline   DM2 on Metformin at home - CBG currently well controlled - A1c 6.1  HTN BP elevated while agitated - follow w/o change in BP meds today   Hypothyroidism IV synthroid - TSH 0.44  Hypokalemia Replete to goal of 4.0 - Mg is 1.7  CAD s/p remote CABG  Depression Cont outpt med tx when able to take oral meds   Anxiety Ativan 0.5mg  IV BID PRN while NPO then resume home Clonazepam when taking PO  GERD continue home IV PPI  Gout Stable  MRSA screen +  Code Status: FULL Family Communication: no family present at time of exam  Antibiotics: Zosyn 3/9 >  DVT prophylaxis: Per Gen Surgery   Objective: Blood pressure 163/85, pulse 89, temperature 98.1 F (36.7 C), temperature source Oral, resp. rate 23, height 6' (1.829 m),  weight 100 kg (220 lb 7.4 oz), SpO2 96 %.  Intake/Output Summary (Last 24 hours) at 04/29/15 1050 Last data filed at 04/29/15 1000  Gross per 24 hour  Intake 1852.5 ml  Output   3360 ml  Net -1507.5 ml   Exam: General: No acute respiratory distress - agitated - confused  Lungs: Clear to auscultation bilaterally without wheezes or crackles Cardiovascular: Regular rate and rhythm without murmur gallop or rub  Abdomen: soft, bowel sounds positive, no rebound Extremities: No significant cyanosis, clubbing, or edema bilateral lower extremities  Data Reviewed: Basic Metabolic Panel:  Recent Labs Lab 04/25/15 2016 04/26/15 0540 04/27/15 0317 04/28/15 0529 04/29/15 0320  NA 138  --  137 139 136  K 3.8  --  3.7 3.6 3.3*  CL 98*  --  101 103 104  CO2 28  --  27 27 21*  GLUCOSE 126*  --  149* 110* 103*  BUN 23*  --  13 13 15   CREATININE 1.61* 1.41* 1.23 1.18 0.96  CALCIUM 9.3  --  8.8* 8.6* 8.9  MG  --   --   --  1.7 1.7    CBC:  Recent Labs Lab 04/25/15 2016 04/26/15 0540 04/27/15 0317 04/28/15 0529 04/29/15 0320  WBC 18.7* 20.3* 15.8* 10.9* 10.1  NEUTROABS  --   --   --  8.3* 7.0  HGB 13.3 11.9* 11.0* 10.8* 11.8*  HCT 42.0 37.4* 35.0* 34.1* 36.8*  MCV 87.3  86.6 87.3 86.8 86.6  PLT 260 222 210 218 255    Liver Function Tests:  Recent Labs Lab 04/25/15 2016 04/28/15 0529 04/29/15 0320  AST 47* 21 19  ALT 118* 45 38  ALKPHOS 175* 84 81  BILITOT 0.9 0.6 0.8  PROT 7.2 5.6* 6.3*  ALBUMIN 4.1 2.7* 2.8*    Recent Labs Lab 04/25/15 2016  LIPASE 22   CBG:  Recent Labs Lab 04/28/15 1625 04/28/15 1923 04/28/15 2350 04/29/15 0321 04/29/15 0809  GLUCAP 94 85 90 96 89    Recent Results (from the past 240 hour(s))  Anaerobic culture     Status: None   Collection Time: 04/26/15  2:00 AM  Result Value Ref Range Status   Specimen Description FLUID PERITONEAL  Final   Special Requests SWAB  Final   Gram Stain   Final    ABUNDANT WBC PRESENT,  PREDOMINANTLY PMN RARE GRAM NEGATIVE RODS RARE GRAM POSITIVE COCCI IN PAIRS    Culture MULTIPLE ORGANISMS PRESENT, NONE PREDOMINANT  Final   Report Status 04/28/2015 FINAL  Final  Body fluid culture     Status: None   Collection Time: 04/26/15  3:13 AM  Result Value Ref Range Status   Specimen Description FLUID PERITONEAL  Final   Special Requests SWAB  Final   Gram Stain   Final    ABUNDANT WBC PRESENT, PREDOMINANTLY PMN RARE GRAM NEGATIVE RODS RARE GRAM POSITIVE COCCI IN PAIRS    Culture MULTIPLE ORGANISMS PRESENT, NONE PREDOMINANT  Final   Report Status 04/28/2015 FINAL  Final  MRSA PCR Screening     Status: Abnormal   Collection Time: 04/26/15  4:50 AM  Result Value Ref Range Status   MRSA by PCR POSITIVE (A) NEGATIVE Final    Comment:        The GeneXpert MRSA Assay (FDA approved for NASAL specimens only), is one component of a comprehensive MRSA colonization surveillance program. It is not intended to diagnose MRSA infection nor to guide or monitor treatment for MRSA infections. RESULT CALLED TO, READ BACK BY AND VERIFIED WITH: H.STONE RN 04/26/15 AT 0848 BY A.DAVIS      Studies:   Recent x-ray studies have been reviewed in detail by the Attending Physician  Scheduled Meds:  Scheduled Meds: . Chlorhexidine Gluconate Cloth  6 each Topical Q0600  . clonazePAM  1 mg Oral BID  . enoxaparin (LOVENOX) injection  40 mg Subcutaneous Q24H  . folic acid  1 mg Oral Daily  . gabapentin  900 mg Oral QHS  . haloperidol lactate  7 mg Intravenous Q6H  . insulin aspart  0-9 Units Subcutaneous 6 times per day  . levothyroxine  37.5 mcg Intravenous QAC breakfast  . metoprolol  7.5 mg Intravenous 4 times per day  . mupirocin ointment  1 application Nasal BID  . pantoprazole (PROTONIX) IV  40 mg Intravenous Daily  . piperacillin-tazobactam (ZOSYN)  IV  3.375 g Intravenous 3 times per day  . thiamine  100 mg Oral Daily  . venlafaxine XR  225 mg Oral QHS    Time spent on  care of this patient: 35 mins   MCCLUNG,JEFFREY T , MD   [facility] Office  [phone] Pager - Text Page per Shea Evans as per below:  On-Call/Text Page:      [email or web address]      password [email or web address]  If 7PM-7AM, please contact night-coverage [email or web address] Password [email or web address] 04/29/2015, 10:50 AM   LOS: 3 days

## 2015-04-29 NOTE — Progress Notes (Signed)
3 Days Post-Op  Subjective: Patient awake, alert Still seems mildly confused, but not combative Indicates some abdominal pain Ostomy beginning to function Appreciate TR[facility] consultation for his multiple medical issues  Objective: Vital signs in last 24 hours: Temp:  [97.2 F (36.2 C)-98.2 F (36.8 C)] 97.2 F (36.2 C) (03/12 2352) Pulse Rate:  [78-103] 96 (03/13 0321) Resp:  [19-36] 23 (03/13 0321) BP: (128-152)/(79-98) 128/91 mmHg (03/13 0321) SpO2:  [97 %-100 %] 98 % (03/13 0321) Last BM Date: 04/28/15 (colostomy in place)  Intake/Output from previous day: 03/12 0701 - 03/13 0700 In: 627.5 [P.O.:90; I.V.:437.5; IV Piggyback:100] Out: 2635 [Urine:975; Stool:1660] Intake/Output this shift:    General appearance: alert, cooperative, no distress and mildly confused Resp: clear to auscultation bilaterally Cardio: regular rate and rhythm, S1, S2 normal, no murmur, click, rub or gallop GI: soft, incisional tenderness; LLQ ostomy viable with good output Incision - VAC in place with good seal  Lab Results:   Recent Labs  04/28/15 0529 04/29/15 0320  WBC 10.9* 10.1  HGB 10.8* 11.8*  HCT 34.1* 36.8*  PLT 218 255   BMET  Recent Labs  04/28/15 0529 04/29/15 0320  NA 139 136  K 3.6 3.3*  CL 103 104  CO2 27 21*  GLUCOSE 110* 103*  BUN 13 15  CREATININE 1.18 0.96  CALCIUM 8.6* 8.9   PT/INR No results for input(s): LABPROT, INR in the last 72 hours. ABG  Recent Labs  04/27/15 0924 04/28/15 1026  PHART 7.493* 7.379  HCO3 23.3 24.4*    Studies/Results: No results found.  Anti-infectives: Anti-infectives    Start     Dose/Rate Route Frequency Ordered Stop   04/26/15 0600  piperacillin-tazobactam (ZOSYN) IVPB 3.375 g     3.375 g 12.5 mL/hr over 240 Minutes Intravenous 3 times per day 04/26/15 0330     04/25/15 2145  piperacillin-tazobactam (ZOSYN) IVPB 3.375 g     3.375 g 12.5 mL/hr over 240 Minutes Intravenous  Once 04/25/15 2130 04/26/15 0218       Assessment/Plan: s/p Procedure(s): EXPLORATORY LAPAROTOMY PARTIAL COLECTOMY WITH COLOSTOMY AND HARTMANN'S (N/A) Advance diet  EtOH withdrawal - CIWA protocol and Haldol Physical therapy evaluation  Step-down at least one more day until confusion improves   LOS: 3 days    Dany Walther K. 04/29/2015

## 2015-04-29 NOTE — Progress Notes (Signed)
Pt screaming for help, restless in bed. states he wants to go home. Explained that he has to wait until MD says its ok to go home. Wants wife. Explained she will be in to see him this am. Pt's wife called, hoping will help calm pt down. Pt contiues to be irritated and using foul language. Ativan 2mg  given, will cont to reassess.  Pt's wife at bedside and updated about pt's status. Stated wants to talk with MD. Dr. Thereasa Solo paged. Will cont to monitor.

## 2015-04-29 NOTE — Consult Note (Addendum)
WOC wound consult note Pt followed by surgical team for assessment and plan of care Reason for Consult:  Vac application to abd Wound type: Full thickness midline abd post-op wound Wound bed: Unchanged since previous assessment; beefy red, interspersed with yellow Drainage (amount, consistency, odor) Small amt bloody drainage in the cannister Periwound: Intact  Dressing procedure/placement/frequency: Applied one piece black foam to 129mm cont suction. Pt medicated for pain prior to procedure and tolerated with mod amt discomfort. Plan dressing change Wed.  WOC ostomy consult note Stoma type/location: Pt had colostomy surgery performed on 3/10 to LLQ. Current pouch is leaking behind the barrier. Stomal assessment/size: Stoma red and viable, flush with skin level, 1 3/4 inches Peristomal assessment: Intact skin surrounding Treatment options for stomal/peristomal skin: Applied barrier ring to maintain seal Output: Mod amt tan liquid stool Ostomy pouching: 1pc. Education provided: Demonstrated pouch change procedure. Pt watched and asked questions. Will continue teaching sessions when stable and out of ICU. Enrolled patient in [place] program: No Julien Girt MSN, [place], [place], [place], [place]

## 2015-04-30 LAB — CBC
HCT: 38.3 % — ABNORMAL LOW (ref 39.0–52.0)
Hemoglobin: 12.5 g/dL — ABNORMAL LOW (ref 13.0–17.0)
MCH: 27.7 pg (ref 26.0–34.0)
MCHC: 32.6 g/dL (ref 30.0–36.0)
MCV: 84.7 fL (ref 78.0–100.0)
Platelets: 294 10*3/uL (ref 150–400)
RBC: 4.52 MIL/uL (ref 4.22–5.81)
RDW: 15.7 % — AB (ref 11.5–15.5)
WBC: 10.1 10*3/uL (ref 4.0–10.5)

## 2015-04-30 LAB — RENAL FUNCTION PANEL
ALBUMIN: 3.1 g/dL — AB (ref 3.5–5.0)
Anion gap: 13 (ref 5–15)
BUN: 12 mg/dL (ref 6–20)
CALCIUM: 9.3 mg/dL (ref 8.9–10.3)
CO2: 19 mmol/L — AB (ref 22–32)
Chloride: 108 mmol/L (ref 101–111)
Creatinine, Ser: 1.04 mg/dL (ref 0.61–1.24)
GFR calc Af Amer: >60 mL/min (ref >60)
GFR calc non Af Amer: >60 mL/min (ref >60)
GLUCOSE: 104 mg/dL — AB (ref 65–99)
PHOSPHORUS: 2.5 mg/dL (ref 2.5–4.6)
POTASSIUM: 3.1 mmol/L — AB (ref 3.5–5.1)
SODIUM: 140 mmol/L (ref 135–145)

## 2015-04-30 LAB — GLUCOSE, CAPILLARY
GLUCOSE-CAPILLARY: 100 mg/dL — AB (ref 65–99)
GLUCOSE-CAPILLARY: 108 mg/dL — AB (ref 65–99)
GLUCOSE-CAPILLARY: 156 mg/dL — AB (ref 65–99)
Glucose-Capillary: 96 mg/dL (ref 65–99)
Glucose-Capillary: 183 mg/dL — ABNORMAL HIGH (ref 65–99)
Glucose-Capillary: 124 mg/dL — ABNORMAL HIGH (ref 65–99)

## 2015-04-30 LAB — RPR: RPR: NONREACTIVE

## 2015-04-30 MED ORDER — HALOPERIDOL LACTATE 5 MG/ML IJ SOLN
7.0000 mg | Freq: Three times a day (TID) | INTRAMUSCULAR | Status: DC
  Administered 2015-04-30 – 2015-05-01 (×2): 7 mg via INTRAVENOUS
  Filled 2015-04-30 (×2): qty 2

## 2015-04-30 MED ORDER — POTASSIUM CHLORIDE 10 MEQ/100ML IV SOLN
10.0000 meq | INTRAVENOUS | Status: AC
  Administered 2015-04-30 (×4): 10 meq via INTRAVENOUS
  Filled 2015-04-30 (×4): qty 100

## 2015-04-30 MED ORDER — METOPROLOL TARTRATE 25 MG PO TABS
25.0000 mg | ORAL_TABLET | Freq: Two times a day (BID) | ORAL | Status: DC
  Administered 2015-04-30 – 2015-05-02 (×5): 25 mg via ORAL
  Filled 2015-04-30 (×5): qty 1

## 2015-04-30 MED ORDER — HYDROCODONE-ACETAMINOPHEN 5-325 MG PO TABS
1.0000 | ORAL_TABLET | ORAL | Status: DC | PRN
  Administered 2015-04-30 – 2015-05-01 (×7): 2 via ORAL
  Administered 2015-05-01: 1 via ORAL
  Administered 2015-05-02 – 2015-05-03 (×5): 2 via ORAL
  Filled 2015-04-30 (×14): qty 2

## 2015-04-30 MED ORDER — BOOST / RESOURCE BREEZE PO LIQD
1.0000 | Freq: Three times a day (TID) | ORAL | Status: DC
  Administered 2015-04-30 – 2015-05-05 (×12): 1 via ORAL

## 2015-04-30 MED ORDER — LEVOTHYROXINE SODIUM 75 MCG PO TABS
75.0000 ug | ORAL_TABLET | Freq: Every day | ORAL | Status: DC
  Administered 2015-05-01 – 2015-05-06 (×6): 75 ug via ORAL
  Filled 2015-04-30 (×6): qty 1

## 2015-04-30 MED ORDER — PANTOPRAZOLE SODIUM 40 MG PO TBEC
40.0000 mg | DELAYED_RELEASE_TABLET | Freq: Every day | ORAL | Status: DC
  Administered 2015-05-01 – 2015-05-06 (×6): 40 mg via ORAL
  Filled 2015-04-30 (×6): qty 1

## 2015-04-30 MED ORDER — ACETAMINOPHEN 325 MG PO TABS: 325.0000 mg | ORAL_TABLET | Freq: Four times a day (QID) | ORAL | Status: DC | PRN

## 2015-04-30 MED ORDER — OXYCODONE-ACETAMINOPHEN 5-325 MG PO TABS
1.0000 | ORAL_TABLET | ORAL | Status: DC | PRN
  Administered 2015-04-30: 1 via ORAL
  Filled 2015-04-30: qty 1

## 2015-04-30 NOTE — Progress Notes (Signed)
9 mL of hydromorphone wasted in sink. Witnessed by [employer], RN.

## 2015-04-30 NOTE — Progress Notes (Signed)
Pt arrived to [facility] from [place].

## 2015-04-30 NOTE — Consult Note (Signed)
[facility] Medical Consultation  [name] [name] [id number] DOB: January 28, 1946 DOA: 04/25/2015 PCP: Merrilee Seashore, MD   Requesting physician: Pilar Jarvis CCS Date of consultation: 04/26/2015 Reason for consultation: Multiple medical problems  Impression/Recommendations Active Problems:   Hypertension   DM (diabetes mellitus), type 2 ([place])   AKI (acute kidney injury) ([place])   Diverticulitis   Perforated diverticulum of large intestine   Diverticulitis of colon with perforation   Depression   GERD (gastroesophageal reflux disease)   Hypothyroidism   Diverticulitis of large intestine with perforation without bleeding   Type 2 diabetes mellitus with complication (HCC)   CAD in native artery   Abdominal pain   Agitation   Threatening to others   Perforated diverticulitis,  -s/p exp laparotomy, partial colectomy with colostomy and hartmann's - Zosyn per surgery  EtOH Abuse -Continue CIWA protocol  -Normal saline 75 ml/hr  Respiratory alkalosis non-anion gap -Hyperventilation Secondary to patient's agitation, EtOH withdrawal, Pain  -Restart anxiety medication -Continue CIWA -UDS; positive for opiates and benzodiazepine both of which patient is receiving  AKI, ( Baseline Cr 0.98) -Resolved  Type II diabetes. With complication -Stable On Metformin at home. CBGs at home twice weekly less than 110.  -Sensitive SSI   Hypertension.  -BP is elevated.  -Metoprolol 25 mg BID   Hypothyroidism. -Synthroid 75 g daily   CAD remote MI / CABG.  -Remote CABG. Followed by PCP  Depression. - Exacerbated by current medical events per wife - Effexor 225 mg daily  Anxiety.  -Clonazepam 1mg  BID (on hold)  Agitation/Threatening to others  -Decrease Haldol 7 mg TID  Jerrye Bushy.  -continue home IV PPI  Gout.  -Stable  HypoKalemia  - Potassium IV 64meq    I will followup again tomorrow. Please contact me if I can be of assistance in the meanwhile. Thank you  for this consultation.  Chief Complaint: Multiple medical problems  HPI: 70 yo WM PMHx Active EtOH Abuse, HTN, MI, CAD native artery, Chronic Diastolic CHF,   Developed sudden onset of constant lower abdominal pain last night. Diagnosed with perforated diverticulum in ED. No previous history of diverticulitis. Patient felt fine prior to last night. Patient is followed by Pain Management for chronic back problems. He requires narcotics at home on a regular basis. His last colonoscopy was in 2014 with [facility].   3/12 A/O 2 (does not know where, why) does know he is in the hospital, afebrile Patient very pleasant.    Review of symptoms Unable to obtain patient sedated     Consultants:   Procedure/Significant Events: 3/10 EXPLORATORY LAPAROTOMY, PARTIAL COLECTOMY WITH COLOSTOMY AND HARTMANN'S    Culture    Antibiotics: Zosyn 3/9>>  DVT prophylaxis: SCD   Past Medical History  Diagnosis Date  . Arthritis   . Hypertension   . GERD (gastroesophageal reflux disease)   . Gout   . Anal fistula   . Myocardial infarction ([place])   . CAD (coronary artery disease)     a. s/p CABG 2001; b. LHC (3/09):  S-RCA, L-LAD, S-OM1/dCFX all patent, EF 55-60%;  c. myoview (12/12):  no ischemia, EF 68%;  d. Dob Echo (1/14):  + ECG changes but normal echo => med Rx cont'd  . Hyperlipidemia   . CHF (congestive heart failure) ([place])   . Diabetes mellitus without complication [facility])    Past Surgical History  Procedure Laterality Date  . Appendectomy    . Joint replacement      Right knee  . Cholecystectomy    .  Coronary artery bypass graft      2001 LIMA LAD, SVG OM/Circ dista, SVG PDA.  Cath 2009 with patent grafts  . Treatment fistula anal    . Hip arthroplasty Right 03/01/2013    Procedure: ARTHROPLASTY BIPOLAR HIP;  Surgeon: Alta Corning, MD;  Location: [facility];  Service: Orthopedics;  Laterality: Right;  . Partial colectomy N/A 04/26/2015    Procedure: EXPLORATORY LAPAROTOMY PARTIAL  COLECTOMY WITH COLOSTOMY AND HARTMANN'S;  Surgeon: Georganna Skeans, MD;  Location: [facility];  Service: General;  Laterality: N/A;   Social History:  reports that he has quit smoking. He has never used smokeless tobacco. He reports that he drinks about 21.6 oz of alcohol per week. He reports that he does not use illicit drugs.  Allergies  Allergen Reactions  . Ciprofloxacin Hcl Other (See Comments)    Causes pain in tendons, feels like muscle spasms.   Family History  Problem Relation Age of Onset  . Colon cancer Mother   . Heart disease Father   . Heart disease Sister   . Heart disease Brother   . Diabetes Sister   . Heart disease Brother   . Heart disease Brother     Prior to Admission medications   Medication Sig Start Date End Date Taking? Authorizing Provider  allopurinol (ZYLOPRIM) 300 MG tablet Take 300 mg by mouth every evening.    Yes Historical Provider, MD  Artificial Tear Ointment (DRY EYES OP) Place 1 drop into both eyes 2 (two) times daily as needed (dry eyes).   Yes Historical Provider, MD  aspirin EC 325 MG tablet Take 1 tablet (325 mg total) by mouth 2 (two) times daily after a meal. Take x 1 month post op. Patient taking differently: Take 325 mg by mouth daily with breakfast.  03/01/13  Yes Gary Fleet, PA-C  B Complex-C (B-COMPLEX WITH VITAMIN C) tablet Take 1 tablet by mouth daily with breakfast.    Yes Historical Provider, MD  calcium-vitamin D (OSCAL WITH D) 500-200 MG-UNIT per tablet Take 1 tablet by mouth daily with breakfast.   Yes Historical Provider, MD  cholecalciferol (VITAMIN D) 1000 UNITS tablet Take 2,000 Units by mouth daily.    Yes Historical Provider, MD  clonazePAM (KLONOPIN) 1 MG tablet Take 1 mg by mouth 2 (two) times daily.    Yes Historical Provider, MD  docusate sodium 100 MG CAPS Take 100 mg by mouth 2 (two) times daily as needed for mild constipation. Patient taking differently: Take 300 mg by mouth at bedtime.  03/03/13  Yes Nishant Dhungel, MD   fluticasone (FLONASE) 50 MCG/ACT nasal spray Place 1 spray into both nostrils daily as needed. 04/13/15  Yes Historical Provider, MD  gabapentin (NEURONTIN) 300 MG capsule Take 900 mg by mouth at bedtime.  02/21/13  Yes Historical Provider, MD  HYDROcodone-acetaminophen (NORCO) 10-325 MG per tablet Take 1 tablet by mouth every 6 (six) hours as needed for moderate pain.   Yes Historical Provider, MD  hydrocortisone cream 1 % Apply 1 application topically daily as needed for itching (for feet).   Yes Historical Provider, MD  levothyroxine (SYNTHROID, LEVOTHROID) 75 MCG tablet Take 75 mcg by mouth every morning.  04/13/15  Yes Historical Provider, MD  MAGNESIUM OXIDE PO Take 500 mg by mouth daily with breakfast.    Yes Historical Provider, MD  metFORMIN (GLUCOPHAGE-XR) 500 MG 24 hr tablet Take 1,000 mg by mouth at bedtime. 04/24/15  Yes Historical Provider, MD  metoprolol (LOPRESSOR) 100 MG tablet  Take 100 mg by mouth 2 (two) times daily. 04/24/15  Yes Historical Provider, MD  nitroGLYCERIN (NITROSTAT) 0.4 MG SL tablet Place 1 tablet (0.4 mg total) under the tongue every 5 (five) minutes as needed. 02/23/12  Yes Minus Breeding, MD  omeprazole (PRILOSEC) 40 MG capsule Take 40 mg by mouth daily. 04/13/15  Yes Historical Provider, MD  potassium gluconate 595 (99 K) MG TABS tablet Take 595 mg by mouth daily.   Yes Historical Provider, MD  Probiotic Product (PROBIOTIC DAILY PO) Take 1 capsule by mouth every morning.    Yes Historical Provider, MD  valsartan (DIOVAN) 80 MG tablet Take 80 mg by mouth daily with breakfast.   Yes Historical Provider, MD  venlafaxine XR (EFFEXOR-XR) 75 MG 24 hr capsule Take 225 mg by mouth at bedtime. 04/19/15  Yes Historical Provider, MD  azithromycin (ZITHROMAX) 500 MG tablet Take 1 tablet (500 mg total) by mouth daily. Take on 08/28/14 Patient not taking: Reported on 04/25/2015 08/28/14   Orson Eva, MD  cefdinir (OMNICEF) 300 MG capsule Take 1 capsule (300 mg total) by mouth 2 (two) times  daily. Start 08/28/14 Patient not taking: Reported on 04/25/2015 08/28/14   Orson Eva, MD  furosemide (LASIX) 20 MG tablet Take 1 tablet (20 mg total) by mouth as needed. Patient not taking: Reported on 04/25/2015 02/23/12   Minus Breeding, MD  metoprolol succinate (TOPROL-XL) 100 MG 24 hr tablet Take 1 tablet (100 mg total) by mouth daily. Patient not taking: Reported on 04/25/2015 02/23/12   Minus Breeding, MD   Physical Exam: Blood pressure 140/88, pulse 96, temperature 97.9 F (36.6 C), temperature source Oral, resp. rate 31, height 6' (1.829 m), weight 100 kg (220 lb 7.4 oz), SpO2 90 %. Filed Vitals:   04/29/15 2325 04/29/15 2356 04/30/15 0350 04/30/15 0737  BP: 158/90 158/90 153/85 140/88  Pulse:  109 105 96  Temp:   99.9 F (37.7 C) 97.9 F (36.6 C)  TempSrc:   Axillary Oral  Resp: 32  22 31  Height:      Weight:      SpO2:   94% 90%     General:  A/O 2 (does not know where, why) does know he is in the hospital, No acute respiratory distress Eyes: Negative headache, eye pain, double vision,negative scleral hemorrhage ENT: Negative Runny nose, negative ear pain, negative gingival bleeding, Neck:  Negative scars, masses, torticollis, lymphadenopathy, JVD Lungs: Clear to auscultation bilaterally without wheezes or crackles Cardiovascular: Tachycardic, Regular rhythm without murmur gallop or rub normal S1 and S2 Abdomen:negative abdominal pain, nondistended, positive soft, bowel sounds, no rebound, no ascites, no appreciable mass Extremities: No significant cyanosis, clubbing, or edema bilateral lower extremities  Psychiatric:  Negative depression, negative anxiety, negative fatigue, negative mania  Neurologic:  Cranial nerves II through XII intact, tongue/uvula midline, all extremities muscle strength 5/5, sensation intact throughout, negative dysarthria, negative expressive aphasia, negative receptive aphasia.    Labs on Admission:  Basic Metabolic Panel:  Recent Labs Lab  04/25/15 2016 04/26/15 0540 04/27/15 0317 04/28/15 0529 04/29/15 0320 04/30/15 0336  NA 138  --  137 139 136 140  K 3.8  --  3.7 3.6 3.3* 3.1*  CL 98*  --  101 103 104 108  CO2 28  --  27 27 21* 19*  GLUCOSE 126*  --  149* 110* 103* 104*  BUN 23*  --  13 13 15 12   CREATININE 1.61* 1.41* 1.23 1.18 0.96 1.04  CALCIUM 9.3  --  8.8* 8.6* 8.9 9.3  MG  --   --   --  1.7 1.7  --   PHOS  --   --   --   --   --  2.5   Liver Function Tests:  Recent Labs Lab 04/25/15 2016 04/28/15 0529 04/29/15 0320 04/30/15 0336  AST 47* 21 19  --   ALT 118* 45 38  --   ALKPHOS 175* 84 81  --   BILITOT 0.9 0.6 0.8  --   PROT 7.2 5.6* 6.3*  --   ALBUMIN 4.1 2.7* 2.8* 3.1*    Recent Labs Lab 04/25/15 2016  LIPASE 22    Recent Labs Lab 04/29/15 1224  AMMONIA 56*   CBC:  Recent Labs Lab 04/26/15 0540 04/27/15 0317 04/28/15 0529 04/29/15 0320 04/30/15 0336  WBC 20.3* 15.8* 10.9* 10.1 10.1  NEUTROABS  --   --  8.3* 7.0  --   HGB 11.9* 11.0* 10.8* 11.8* 12.5*  HCT 37.4* 35.0* 34.1* 36.8* 38.3*  MCV 86.6 87.3 86.8 86.6 84.7  PLT 222 210 218 255 294   Cardiac Enzymes: No results for input(s): CKTOTAL, CKMB, CKMBINDEX, TROPONINI in the last 168 hours. BNP: Invalid input(s): POCBNP CBG:  Recent Labs Lab 04/29/15 1543 04/29/15 2011 04/29/15 2324 04/30/15 0337 04/30/15 0730  GLUCAP 112* 107* 100* 108* 96    Radiological Exams on Admission: No results found.  EKG: N/A  Care during the described time interval was provided by me .  I have reviewed this patient's available data, including medical history, events of note, physical examination, and all test results as part of my evaluation. I have personally reviewed and interpreted all radiology studies.  Time spent: 35 minutes  Allie Bossier [facility] Pager [phone]  If 7PM-7AM, please contact night-coverage [email or web address] Password [email or web address] 04/30/2015, 9:07 AM

## 2015-04-30 NOTE — Progress Notes (Signed)
Pt stable for transfer to 6N13. Full report given to [place].

## 2015-04-30 NOTE — Progress Notes (Signed)
Key Points: Use following P&T approved IV to PO antibiotic change policy.  Description contains the criteria that are approved Note: Policy Excludes:  Esophagectomy patientsPHARMACIST - PHYSICIAN COMMUNICATION  CONCERNING: IV to Oral Route Change Policy  RECOMMENDATION: This patient is receiving synthroid and protonix by the intravenous route.  Based on criteria approved by the Pharmacy and Therapeutics Committee, the intravenous medication(s) is/are being converted to the equivalent oral dose form(s).   DESCRIPTION: These criteria include:  The patient is eating (either orally or via tube) and/or has been taking other orally administered medications for a least 24 hours  The patient has no evidence of active gastrointestinal bleeding or impaired GI absorption (gastrectomy, short bowel, patient on TNA or NPO).  If you have questions about this conversion, please contact the Pharmacy Department  []   [phone] )  [facility] []   [phone] )  [facility] [x]   [phone] )  [facility] []   [phone] )  [facility] []   [phone] )  [place], [place] D 04/30/2015 11:00 AM

## 2015-04-30 NOTE — Progress Notes (Signed)
PT Cancellation Note  Patient Details Name: [name] [name] MRN: [id number] DOB: [birth year]   Cancelled Treatment:    Reason Eval/Treat Not Completed: Other (comment)   Pt refusing getting up and moving; RN and MD aware;  Noted pt is confused and at times combative;   Will follow up tomorrow for PT eval;   Roney Marion, [place] Pager [phone] Office [phone]    Roney Marion [facility] 04/30/2015, 12:24 PM

## 2015-04-30 NOTE — Progress Notes (Signed)
9ml of hydromorphone wasted in the presence of RN Lovett Sox

## 2015-04-30 NOTE — Progress Notes (Signed)
Patient ID: [name] [name], male   DOB: 31-Dec-1945, 70 y.o.   MRN: [id number]     [facility]      [address] [place], [place] [address]    Phone: [phone] FAX: [phone]     Subjective: Confused.  Sitter at bedside. Increase ostomy output, diarrhea 2.1L out yesterday.  VSS.  Afebrile.  K 3.1  WBC normal.    Objective:  Vital signs:  Filed Vitals:   04/29/15 2325 04/29/15 2356 04/30/15 0350 04/30/15 0737  BP: 158/90 158/90 153/85 140/88  Pulse:  109 105 96  Temp:   99.9 F (37.7 C) 97.9 F (36.6 C)  TempSrc:   Axillary Oral  Resp: 32  22 31  Height:      Weight:      SpO2:   94% 90%    Last BM Date: 04/29/15  Intake/Output   Yesterday:  03/13 0701 - 03/14 0700 In: 3508.8 [P.O.:1180; I.V.:1678.8; IV Piggyback:650] Out: 4700 [Urine:2475; Drains:100; Stool:2125] This shift:  Total I/O In: 240 [P.O.:240] Out: -   Physical Exam: General: Pt awake/alert/oriented x4 in no acute distress Chest: cta.  Diminished BLL.   CV:  Pulses intact.  Regular rhythm MS: Normal AROM mjr joints.  No obvious deformity Abdomen: Soft.  Nondistended.tender.  VAC in place. llq ostomy is viable and functioning.  No evidence of peritonitis.  No incarcerated hernias. Ext:  SCDs BLE.  No mjr edema.  No cyanosis Skin: No petechiae / purpura   Problem List:   Active Problems:   Hypertension   DM (diabetes mellitus), type 2 (HCC)   AKI (acute kidney injury) ([place])   Diverticulitis   Perforated diverticulum of large intestine   Diverticulitis of colon with perforation   Depression   GERD (gastroesophageal reflux disease)   Hypothyroidism   Diverticulitis of large intestine with perforation without bleeding   Type 2 diabetes mellitus with complication (HCC)   CAD in native artery   Abdominal pain   Agitation   Threatening to others    Results:   Labs: Results for orders placed or performed during the hospital encounter of  04/25/15 (from the past 37 hour(s))  I-STAT 3, arterial blood gas (G3+)     Status: Abnormal   Collection Time: 04/28/15 10:26 AM  Result Value Ref Range   pH, Arterial 7.379 7.350 - 7.450   pCO2 arterial 41.3 35.0 - 45.0 mmHg   pO2, Arterial 85.0 80.0 - 100.0 mmHg   Bicarbonate 24.4 (H) 20.0 - 24.0 mEq/L   TCO2 26 0 - 100 mmol/L   O2 Saturation 96.0 %   Acid-base deficit 1.0 0.0 - 2.0 mmol/L   Patient temperature 98.4 F    Collection site RADIAL, ALLEN'S TEST ACCEPTABLE    Drawn by RT    Sample type ARTERIAL   Glucose, capillary     Status: None   Collection Time: 04/28/15 11:49 AM  Result Value Ref Range   Glucose-Capillary 92 65 - 99 mg/dL   Comment 1 Capillary Specimen   Glucose, capillary     Status: None   Collection Time: 04/28/15  4:25 PM  Result Value Ref Range   Glucose-Capillary 94 65 - 99 mg/dL   Comment 1 Capillary Specimen   Glucose, capillary     Status: None   Collection Time: 04/28/15  7:23 PM  Result Value Ref Range   Glucose-Capillary 85 65 - 99 mg/dL   Comment 1 Capillary Specimen  Glucose, capillary     Status: None   Collection Time: 04/28/15 11:50 PM  Result Value Ref Range   Glucose-Capillary 90 65 - 99 mg/dL  Comprehensive metabolic panel     Status: Abnormal   Collection Time: 04/29/15  3:20 AM  Result Value Ref Range   Sodium 136 135 - 145 mmol/L   Potassium 3.3 (L) 3.5 - 5.1 mmol/L   Chloride 104 101 - 111 mmol/L   CO2 21 (L) 22 - 32 mmol/L   Glucose, Bld 103 (H) 65 - 99 mg/dL   BUN 15 6 - 20 mg/dL   Creatinine, Ser 0.96 0.61 - 1.24 mg/dL   Calcium 8.9 8.9 - 10.3 mg/dL   Total Protein 6.3 (L) 6.5 - 8.1 g/dL   Albumin 2.8 (L) 3.5 - 5.0 g/dL   AST 19 15 - 41 U/L   ALT 38 17 - 63 U/L   Alkaline Phosphatase 81 38 - 126 U/L   Total Bilirubin 0.8 0.3 - 1.2 mg/dL   GFR calc non Af Amer >60 >60 mL/min   GFR calc Af Amer >60 >60 mL/min    Comment: (NOTE) The eGFR has been calculated using the CKD EPI equation. This calculation has not been  validated in all clinical situations. eGFR's persistently <60 mL/min signify possible Chronic Kidney Disease.    Anion gap 11 5 - 15  Magnesium     Status: None   Collection Time: 04/29/15  3:20 AM  Result Value Ref Range   Magnesium 1.7 1.7 - 2.4 mg/dL  CBC with Differential/Platelet     Status: Abnormal   Collection Time: 04/29/15  3:20 AM  Result Value Ref Range   WBC 10.1 4.0 - 10.5 K/uL   RBC 4.25 4.22 - 5.81 MIL/uL   Hemoglobin 11.8 (L) 13.0 - 17.0 g/dL   HCT 36.8 (L) 39.0 - 52.0 %   MCV 86.6 78.0 - 100.0 fL   MCH 27.8 26.0 - 34.0 pg   MCHC 32.1 30.0 - 36.0 g/dL   RDW 16.1 (H) 11.5 - 15.5 %   Platelets 255 150 - 400 K/uL   Neutrophils Relative % 69 %   Neutro Abs 7.0 1.7 - 7.7 K/uL   Lymphocytes Relative 21 %   Lymphs Abs 2.1 0.7 - 4.0 K/uL   Monocytes Relative 7 %   Monocytes Absolute 0.7 0.1 - 1.0 K/uL   Eosinophils Relative 3 %   Eosinophils Absolute 0.3 0.0 - 0.7 K/uL   Basophils Relative 0 %   Basophils Absolute 0.0 0.0 - 0.1 K/uL  Glucose, capillary     Status: None   Collection Time: 04/29/15  3:21 AM  Result Value Ref Range   Glucose-Capillary 96 65 - 99 mg/dL   Comment 1 Arterial Specimen   Glucose, capillary     Status: None   Collection Time: 04/29/15  8:09 AM  Result Value Ref Range   Glucose-Capillary 89 65 - 99 mg/dL  Glucose, capillary     Status: Abnormal   Collection Time: 04/29/15 11:29 AM  Result Value Ref Range   Glucose-Capillary 116 (H) 65 - 99 mg/dL  Vitamin B12     Status: None   Collection Time: 04/29/15 12:24 PM  Result Value Ref Range   Vitamin B-12 338 180 - 914 pg/mL    Comment: (NOTE) This assay is not validated for testing neonatal or myeloproliferative syndrome specimens for Vitamin B12 levels.   Folate     Status: None   Collection Time:  04/29/15 12:24 PM  Result Value Ref Range   Folate 38.8 >5.9 ng/mL  RPR     Status: None   Collection Time: 04/29/15 12:24 PM  Result Value Ref Range   RPR Ser Ql Non Reactive Non  Reactive    Comment: (NOTE) Performed At: [facility] [place], [place] [id number] Lindon Romp MD FB:[id number]   Ammonia     Status: Abnormal   Collection Time: 04/29/15 12:24 PM  Result Value Ref Range   Ammonia 56 (H) 9 - 35 umol/L  Glucose, capillary     Status: Abnormal   Collection Time: 04/29/15  3:43 PM  Result Value Ref Range   Glucose-Capillary 112 (H) 65 - 99 mg/dL   Comment 1 Capillary Specimen    Comment 2 Notify RN   Glucose, capillary     Status: Abnormal   Collection Time: 04/29/15  8:11 PM  Result Value Ref Range   Glucose-Capillary 107 (H) 65 - 99 mg/dL   Comment 1 Capillary Specimen   Glucose, capillary     Status: Abnormal   Collection Time: 04/29/15 11:24 PM  Result Value Ref Range   Glucose-Capillary 100 (H) 65 - 99 mg/dL   Comment 1 Capillary Specimen    Comment 2 Notify RN   CBC     Status: Abnormal   Collection Time: 04/30/15  3:36 AM  Result Value Ref Range   WBC 10.1 4.0 - 10.5 K/uL   RBC 4.52 4.22 - 5.81 MIL/uL   Hemoglobin 12.5 (L) 13.0 - 17.0 g/dL   HCT 38.3 (L) 39.0 - 52.0 %   MCV 84.7 78.0 - 100.0 fL   MCH 27.7 26.0 - 34.0 pg   MCHC 32.6 30.0 - 36.0 g/dL   RDW 15.7 (H) 11.5 - 15.5 %   Platelets 294 150 - 400 K/uL  Renal function panel     Status: Abnormal   Collection Time: 04/30/15  3:36 AM  Result Value Ref Range   Sodium 140 135 - 145 mmol/L   Potassium 3.1 (L) 3.5 - 5.1 mmol/L   Chloride 108 101 - 111 mmol/L   CO2 19 (L) 22 - 32 mmol/L   Glucose, Bld 104 (H) 65 - 99 mg/dL   BUN 12 6 - 20 mg/dL   Creatinine, Ser 1.04 0.61 - 1.24 mg/dL   Calcium 9.3 8.9 - 10.3 mg/dL   Phosphorus 2.5 2.5 - 4.6 mg/dL   Albumin 3.1 (L) 3.5 - 5.0 g/dL   GFR calc non Af Amer >60 >60 mL/min   GFR calc Af Amer >60 >60 mL/min    Comment: (NOTE) The eGFR has been calculated using the CKD EPI equation. This calculation has not been validated in all clinical situations. eGFR's persistently <60 mL/min signify possible Chronic  Kidney Disease.    Anion gap 13 5 - 15  Glucose, capillary     Status: Abnormal   Collection Time: 04/30/15  3:37 AM  Result Value Ref Range   Glucose-Capillary 108 (H) 65 - 99 mg/dL   Comment 1 Capillary Specimen    Comment 2 Notify RN     Imaging / Studies: No results found.  Medications / Allergies:  Scheduled Meds: . Chlorhexidine Gluconate Cloth  6 each Topical Q0600  . cyanocobalamin  1,000 mcg Subcutaneous Daily  . enoxaparin (LOVENOX) injection  40 mg Subcutaneous Q24H  . folic acid  1 mg Oral Daily  . gabapentin  900 mg Oral QHS  . haloperidol lactate  7 mg Intravenous Q6H  . insulin aspart  0-9 Units Subcutaneous 6 times per day  . levothyroxine  37.5 mcg Intravenous QAC breakfast  . metoprolol  7.5 mg Intravenous 4 times per day  . mupirocin ointment  1 application Nasal BID  . pantoprazole (PROTONIX) IV  40 mg Intravenous Daily  . piperacillin-tazobactam (ZOSYN)  IV  3.375 g Intravenous 3 times per day  . potassium chloride  10 mEq Intravenous Q1 Hr x 4  . thiamine  100 mg Oral Daily  . venlafaxine XR  225 mg Oral QHS   Continuous Infusions: . sodium chloride 1,000 mL (04/29/15 1137)   PRN Meds:.fentaNYL (SUBLIMAZE) injection, fluticasone, LORazepam, methocarbamol (ROBAXIN)  IV, nitroGLYCERIN  Antibiotics: Anti-infectives    Start     Dose/Rate Route Frequency Ordered Stop   04/26/15 0600  piperacillin-tazobactam (ZOSYN) IVPB 3.375 g     3.375 g 12.5 mL/hr over 240 Minutes Intravenous 3 times per day 04/26/15 0330     04/25/15 2145  piperacillin-tazobactam (ZOSYN) IVPB 3.375 g     3.375 g 12.5 mL/hr over 240 Minutes Intravenous  Once 04/25/15 2130 04/26/15 0218        Assessment/Plan Perforated diverticulitis POD#4 exploratory laparotomy, hartmann's procedure---Dr. [place] Silos  -stable.  Mobilize to aid in recovery, PT eval/treat -advance diet -WOC consult -VAC changes M-W-F -pathology-perforated diverticulitis, no malignancy  DM  II-SSI/SBs HTN-lopressor  Hypothyroidism-home meds EtOH withdrawal-CIWA protocol.  Appreciate medicine management.  ?wean haldol FEN-advance to fulls, supplements.  Hypokalemia-57mq KCL, AM labs ID-zosyn D#4/7 VTE prophylaxis-SCD/lovenox Dispo-SDU  Appreciate medicine management of multiple medical problems    EErby Pian ANP-BC [facility] Pager [phone](7A-4:30P) For consults and floor pages call [phone](7A-4:30P)  04/30/2015 8:12 AM

## 2015-04-30 NOTE — Progress Notes (Signed)
Paged [facility] twice regarding potassium this am and [facility] once. No new orders placed.

## 2015-05-01 DIAGNOSIS — N179 Acute kidney failure, unspecified: Secondary | ICD-10-CM

## 2015-05-01 DIAGNOSIS — I2510 Atherosclerotic heart disease of native coronary artery without angina pectoris: Secondary | ICD-10-CM

## 2015-05-01 DIAGNOSIS — F329 Major depressive disorder, single episode, unspecified: Secondary | ICD-10-CM

## 2015-05-01 LAB — GLUCOSE, CAPILLARY
GLUCOSE-CAPILLARY: 124 mg/dL — AB (ref 65–99)
Glucose-Capillary: 104 mg/dL — ABNORMAL HIGH (ref 65–99)
Glucose-Capillary: 105 mg/dL — ABNORMAL HIGH (ref 65–99)
Glucose-Capillary: 107 mg/dL — ABNORMAL HIGH (ref 65–99)
Glucose-Capillary: 122 mg/dL — ABNORMAL HIGH (ref 65–99)
Glucose-Capillary: 129 mg/dL — ABNORMAL HIGH (ref 65–99)
Glucose-Capillary: 166 mg/dL — ABNORMAL HIGH (ref 65–99)

## 2015-05-01 LAB — BASIC METABOLIC PANEL
Anion gap: 10 (ref 5–15)
BUN: 11 mg/dL (ref 6–20)
CHLORIDE: 107 mmol/L (ref 101–111)
CO2: 21 mmol/L — AB (ref 22–32)
Calcium: 8.6 mg/dL — ABNORMAL LOW (ref 8.9–10.3)
Creatinine, Ser: 1 mg/dL (ref 0.61–1.24)
GFR calc Af Amer: >60 mL/min (ref >60)
GFR calc non Af Amer: >60 mL/min (ref >60)
GLUCOSE: 94 mg/dL (ref 65–99)
POTASSIUM: 3 mmol/L — AB (ref 3.5–5.1)
Sodium: 138 mmol/L (ref 135–145)

## 2015-05-01 MED ORDER — ALLOPURINOL 300 MG PO TABS
300.0000 mg | ORAL_TABLET | Freq: Every evening | ORAL | Status: DC
  Administered 2015-05-01 – 2015-05-05 (×6): 300 mg via ORAL
  Filled 2015-05-01 (×5): qty 1

## 2015-05-01 MED ORDER — POTASSIUM CHLORIDE CRYS ER 20 MEQ PO TBCR
40.0000 meq | EXTENDED_RELEASE_TABLET | Freq: Once | ORAL | Status: AC
  Administered 2015-05-01: 40 meq via ORAL
  Filled 2015-05-01: qty 2

## 2015-05-01 MED ORDER — HALOPERIDOL LACTATE 5 MG/ML IJ SOLN
7.0000 mg | Freq: Two times a day (BID) | INTRAMUSCULAR | Status: DC
  Administered 2015-05-01: 7 mg via INTRAVENOUS
  Filled 2015-05-01: qty 2

## 2015-05-01 MED ORDER — METOPROLOL SUCCINATE ER 100 MG PO TB24: 100.0000 mg | ORAL_TABLET | Freq: Every day | ORAL | Status: DC

## 2015-05-01 MED ORDER — IRBESARTAN 75 MG PO TABS
75.0000 mg | ORAL_TABLET | Freq: Every day | ORAL | Status: DC
  Administered 2015-05-01 – 2015-05-06 (×6): 75 mg via ORAL
  Filled 2015-05-01 (×6): qty 1

## 2015-05-01 MED ORDER — CLONAZEPAM 1 MG PO TABS
1.0000 mg | ORAL_TABLET | Freq: Two times a day (BID) | ORAL | Status: DC
  Administered 2015-05-01 – 2015-05-03 (×5): 1 mg via ORAL
  Filled 2015-05-01 (×5): qty 1

## 2015-05-01 NOTE — Evaluation (Signed)
Physical Therapy Evaluation Patient Details Name: [name] [name] MRN: [id number] DOB: 06-Sep-1945 Today's Date: 05/01/2015   History of Present Illness  70 yo M Hx active EtOH Abuse, HTN, MI, CAD, and Chronic Diastolic CHF who developed the sudden onset of constant lower abdominal pain and was diagnosed with perforated diverticulum in the ED; s/p Exp Lap, colostomy, Hartman's procedure   Clinical Impression  Pt was able to walk a good distance with the support of the RW into the hallway. He did report that he felt he needed the support of the RW right now due to pain.  PT to follow acutely until d/c confirmed.     Follow Up Recommendations Home health PT;Supervision for mobility/OOB    Equipment Recommendations  None recommended by PT    Recommendations for Other Services   NA    Precautions / Restrictions Precautions Precautions: Other (comment) Precaution Comments: wound vac      Mobility  Bed Mobility Overal bed mobility: Needs Assistance Bed Mobility: Rolling;Sidelying to Sit;Sit to Sidelying Rolling: Min assist Sidelying to sit: Mod assist     Sit to sidelying: Mod assist General bed mobility comments: Mod assist to support trunk when going from side lying to sitting.  Pt realied heavily on bed rail for support during transition.  Verbal cues for log roll and reverse log roll for getting safely into and out of bed.   Transfers Overall transfer level: Needs assistance Equipment used: Rolling walker (2 wheeled) Transfers: Sit to/from Stand Sit to Stand: Min assist         General transfer comment: Min assist to support trunk during transitions.   Ambulation/Gait Ambulation/Gait assistance: Min guard Ambulation Distance (Feet): 240 Feet Assistive device: Rolling walker (2 wheeled) Gait Pattern/deviations: Step-through pattern;Trunk flexed Gait velocity: decreased Gait velocity interpretation: Below normal speed for age/gender General Gait Details: Verbal cues for  upright posture and safe use of RW.          Balance Overall balance assessment: Needs assistance Sitting-balance support: Feet supported;No upper extremity supported Sitting balance-Leahy Scale: Good     Standing balance support: Bilateral upper extremity supported;Single extremity supported Standing balance-Leahy Scale: Poor                               Pertinent Vitals/Pain Pain Assessment: Faces Faces Pain Scale: Hurts even more Pain Location: abdominal pain with mobility Pain Descriptors / Indicators: Grimacing;Guarding Pain Intervention(s): Limited activity within patient's tolerance;Monitored during session;Repositioned;Patient requesting pain meds-RN notified    Home Living Family/patient expects to be discharged to:: Private residence Living Arrangements: Spouse/significant other Available Help at Discharge: Family;Available 24 hours/day Type of Home: House Home Access: Stairs to enter Entrance Stairs-Rails: Can reach both;Left;Right Entrance Stairs-Number of Steps: 3 Home Layout: One level Home Equipment: Walker - 2 wheels;Cane - single point      Prior Function Level of Independence: Independent with assistive device(s)         Comments: ambulates with cane PRN      Hand Dominance   Dominant Hand: Left    Extremity/Trunk Assessment   Upper Extremity Assessment: Overall WFL for tasks assessed           Lower Extremity Assessment: Generalized weakness      Cervical / Trunk Assessment: Other exceptions  Communication   Communication: No difficulties  Cognition Arousal/Alertness: Awake/alert Behavior During Therapy: WFL for tasks assessed/performed Overall Cognitive Status: No family/caregiver present to determine baseline cognitive  functioning                               Assessment/Plan    PT Assessment Patient needs continued PT services  PT Diagnosis Difficulty walking;Abnormality of gait;Acute  pain;Generalized weakness   PT Problem List Decreased strength;Decreased activity tolerance;Decreased balance;Decreased mobility;Decreased knowledge of use of DME;Decreased knowledge of precautions;Pain  PT Treatment Interventions DME instruction;Gait training;Stair training;Functional mobility training;Therapeutic activities;Therapeutic exercise;Neuromuscular re-education;Balance training;Patient/family education   PT Goals (Current goals can be found in the Care Plan section) Acute Rehab PT Goals Patient Stated Goal: to go home tomorrow PT Goal Formulation: With patient Time For Goal Achievement: 05/15/15 Potential to Achieve Goals: Good    Frequency Min 3X/week           End of Session Equipment Utilized During Treatment: Gait belt (placed high) Activity Tolerance: Patient limited by pain Patient left: in bed;with call bell/phone within reach;with bed alarm set Nurse Communication: Mobility status;Patient requests pain meds         Time: [id number] PT Time Calculation (min) (ACUTE ONLY): 24 min   Charges:   PT Evaluation $PT Eval Moderate Complexity: 1 Procedure PT Treatments $Gait Training: 8-22 mins        Korin Hartwell B. [place], [place], DPT [phone]   05/01/2015, 11:20 PM

## 2015-05-01 NOTE — Consult Note (Addendum)
WOC wound consult note Pt followed by surgical team for assessment and plan of care Reason for Consult: Vac change to abd Wound type: Full thickness midline abd post-op wound Wound bed: Unchanged since previous assessment; beefy red, interspersed with yellow 7X3X.8cm Drainage (amount, consistency, odor) Small amt bloody drainage in the cannister Periwound: Intact  Dressing procedure/placement/frequency: Applied one piece black foam to 181mm cont suction. Pt medicated for pain prior to procedure and tolerated with mod amt discomfort. Plan dressing change Fri  WOC ostomy consult note Stoma type/location: Colostomy surgery performed on 3/10 to LLQ.  Stomal assessment/size: Stoma red and viable, flush with skin level, 1 3/4 inches Peristomal assessment: Intact skin surrounding Treatment options for stomal/peristomal skin: Applied barrier ring to maintain seal Output: Mod amt brown liquid stool Ostomy pouching: 1pc. Education provided: Demonstrated pouch change procedure. Pt did not participate and does not appear to understand.  Will need total assistance with pouching activities after discharge.No family members have been present for teaching sessions.  Educational materials left in the room, extra supplies at bedside.Enrolled patient in [place] program: Yes Julien Girt MSN, RN, [place], [place], [place]

## 2015-05-01 NOTE — Consult Note (Signed)
[facility] Medical Consultation  [name] [name] [id number] DOB: 11/20/1945 DOA: 04/25/2015 PCP: Merrilee Seashore, MD   Requesting physician: Pilar Jarvis CCS Date of consultation: 04/26/2015 Reason for consultation: Multiple medical problems  Impression/Recommendations Active Problems:   Hypertension   DM (diabetes mellitus), type 2 ([place])   AKI (acute kidney injury) ([place])   Diverticulitis   Perforated diverticulum of large intestine   Diverticulitis of colon with perforation   Depression   GERD (gastroesophageal reflux disease)   Hypothyroidism   Diverticulitis of large intestine with perforation without bleeding   Type 2 diabetes mellitus with complication (HCC)   CAD in native artery   Abdominal pain   Agitation   Threatening to others    Perforated diverticulitis,  -s/p exp laparotomy 04/26/15 , partial colectomy with colostomy and hartmann's - Zosyn since 04/25/15 per surgery  EtOH Abuse -Continue CIWA protocol, Ranging between 5 and 10  -he is still slightly delusional but non-combative -Normal saline 75 ml/hr  Respiratory alkalosis non-anion gap -Hyperventilation Secondary to patient's agitation, EtOH withdrawal, Pain  -Restart anxiety medication -Continue CIWA -UDS; positive for opiates and benzodiazepine both of which patient is receiving  AKI, ( Baseline Cr 0.98) -Resolved  Type II diabetes. With complication -Stable On Metformin at home. CBGs at home twice weekly less than 110.  -Sensitive SSI  105-107  Hypertension.  -BP is elevated.  -Metoprolol 25 mg BID   Hypothyroidism. -Synthroid 75 g daily  -TSH in 3-4 weeks as OP  CAD remote MI / CABG.  -Remote CABG. Followed by PCP  Depression. - Exacerbated by current medical events per wife - Effexor 225 mg daily  Anxiety.  -Clonazepam 1mg  BID (on hold) -restart clonopin now  Agitation/Threatening to others  -Decrease Haldol 7 mg TID-->BID 3.15.17  Gerd.  -continue home IV  PPI  Gout.  -Stable  HypoKalemia  - Potassium IV 66meq    I will followup again tomorrow but if he remains stable in the next 48 hours i anticipate signing off. Thank you for this consultation.  Chief Complaint: Multiple medical problems  HPI:  70 yo M Hx  active EtOH Abuse,  NSVT CAD with remote CABG 0000000 complicated by sternal nonunion and subsequently admitted 08/2014 with an STEMI Prior large internal hemorrhoids on colonoscopy 09/2004. Rectal abscess s/p surgery in the past. Right from oral neck fracture status post repair 03/03/2013. Patient is followed by Pain Management for chronic back problems. He requires narcotics at home on a regular basis. His last colonoscopy was in 2014 with [facility].  Chronic Diastolic CHF who developed the sudden onset of constant lower abdominal pain and was diagnosed with perforated diverticulum in the ED   Procedure/Significant Events: 3/10 EXPLORATORY LAPAROTOMY, PARTIAL COLECTOMY WITH COLOSTOMY AND HARTMANN'S    Culture    Antibiotics: Zosyn 3/9>>  DVT prophylaxis: SCD   Past Medical History  Diagnosis Date  . Arthritis   . Hypertension   . GERD (gastroesophageal reflux disease)   . Gout   . Anal fistula   . Myocardial infarction ([place])   . CAD (coronary artery disease)     a. s/p CABG 2001; b. LHC (3/09):  S-RCA, L-LAD, S-OM1/dCFX all patent, EF 55-60%;  c. myoview (12/12):  no ischemia, EF 68%;  d. Dob Echo (1/14):  + ECG changes but normal echo => med Rx cont'd  . Hyperlipidemia   . CHF (congestive heart failure) ([place])   . Diabetes mellitus without complication [facility])    Past Surgical History  Procedure  Laterality Date  . Appendectomy    . Joint replacement      Right knee  . Cholecystectomy    . Coronary artery bypass graft      2001 LIMA LAD, SVG OM/Circ dista, SVG PDA.  Cath 2009 with patent grafts  . Treatment fistula anal    . Hip arthroplasty Right 03/01/2013    Procedure: ARTHROPLASTY BIPOLAR HIP;   Surgeon: Alta Corning, MD;  Location: [facility];  Service: Orthopedics;  Laterality: Right;  . Partial colectomy N/A 04/26/2015    Procedure: EXPLORATORY LAPAROTOMY PARTIAL COLECTOMY WITH COLOSTOMY AND HARTMANN'S;  Surgeon: Georganna Skeans, MD;  Location: [place];  Service: General;  Laterality: N/A;   Social History:  reports that he has quit smoking. He has never used smokeless tobacco. He reports that he drinks about 21.6 oz of alcohol per week. He reports that he does not use illicit drugs.  Allergies  Allergen Reactions  . Ciprofloxacin Hcl Other (See Comments)    Causes pain in tendons, feels like muscle spasms.   Family History  Problem Relation Age of Onset  . Colon cancer Mother   . Heart disease Father   . Heart disease Sister   . Heart disease Brother   . Diabetes Sister   . Heart disease Brother   . Heart disease Brother     Prior to Admission medications   Medication Sig Start Date End Date Taking? Authorizing Provider  allopurinol (ZYLOPRIM) 300 MG tablet Take 300 mg by mouth every evening.    Yes Historical Provider, MD  Artificial Tear Ointment (DRY EYES OP) Place 1 drop into both eyes 2 (two) times daily as needed (dry eyes).   Yes Historical Provider, MD  aspirin EC 325 MG tablet Take 1 tablet (325 mg total) by mouth 2 (two) times daily after a meal. Take x 1 month post op. Patient taking differently: Take 325 mg by mouth daily with breakfast.  03/01/13  Yes Gary Fleet, PA-C  B Complex-C (B-COMPLEX WITH VITAMIN C) tablet Take 1 tablet by mouth daily with breakfast.    Yes Historical Provider, MD  calcium-vitamin D (OSCAL WITH D) 500-200 MG-UNIT per tablet Take 1 tablet by mouth daily with breakfast.   Yes Historical Provider, MD  cholecalciferol (VITAMIN D) 1000 UNITS tablet Take 2,000 Units by mouth daily.    Yes Historical Provider, MD  clonazePAM (KLONOPIN) 1 MG tablet Take 1 mg by mouth 2 (two) times daily.    Yes Historical Provider, MD  docusate sodium 100 MG CAPS  Take 100 mg by mouth 2 (two) times daily as needed for mild constipation. Patient taking differently: Take 300 mg by mouth at bedtime.  03/03/13  Yes Nishant Dhungel, MD  fluticasone (FLONASE) 50 MCG/ACT nasal spray Place 1 spray into both nostrils daily as needed. 04/13/15  Yes Historical Provider, MD  gabapentin (NEURONTIN) 300 MG capsule Take 900 mg by mouth at bedtime.  02/21/13  Yes Historical Provider, MD  HYDROcodone-acetaminophen (NORCO) 10-325 MG per tablet Take 1 tablet by mouth every 6 (six) hours as needed for moderate pain.   Yes Historical Provider, MD  hydrocortisone cream 1 % Apply 1 application topically daily as needed for itching (for feet).   Yes Historical Provider, MD  levothyroxine (SYNTHROID, LEVOTHROID) 75 MCG tablet Take 75 mcg by mouth every morning.  04/13/15  Yes Historical Provider, MD  MAGNESIUM OXIDE PO Take 500 mg by mouth daily with breakfast.    Yes Historical Provider, MD  metFORMIN (  GLUCOPHAGE-XR) 500 MG 24 hr tablet Take 1,000 mg by mouth at bedtime. 04/24/15  Yes Historical Provider, MD  metoprolol (LOPRESSOR) 100 MG tablet Take 100 mg by mouth 2 (two) times daily. 04/24/15  Yes Historical Provider, MD  nitroGLYCERIN (NITROSTAT) 0.4 MG SL tablet Place 1 tablet (0.4 mg total) under the tongue every 5 (five) minutes as needed. 02/23/12  Yes Minus Breeding, MD  omeprazole (PRILOSEC) 40 MG capsule Take 40 mg by mouth daily. 04/13/15  Yes Historical Provider, MD  potassium gluconate 595 (99 K) MG TABS tablet Take 595 mg by mouth daily.   Yes Historical Provider, MD  Probiotic Product (PROBIOTIC DAILY PO) Take 1 capsule by mouth every morning.    Yes Historical Provider, MD  valsartan (DIOVAN) 80 MG tablet Take 80 mg by mouth daily with breakfast.   Yes Historical Provider, MD  venlafaxine XR (EFFEXOR-XR) 75 MG 24 hr capsule Take 225 mg by mouth at bedtime. 04/19/15  Yes Historical Provider, MD  azithromycin (ZITHROMAX) 500 MG tablet Take 1 tablet (500 mg total) by mouth daily.  Take on 08/28/14 Patient not taking: Reported on 04/25/2015 08/28/14   Orson Eva, MD  cefdinir (OMNICEF) 300 MG capsule Take 1 capsule (300 mg total) by mouth 2 (two) times daily. Start 08/28/14 Patient not taking: Reported on 04/25/2015 08/28/14   Orson Eva, MD  furosemide (LASIX) 20 MG tablet Take 1 tablet (20 mg total) by mouth as needed. Patient not taking: Reported on 04/25/2015 02/23/12   Minus Breeding, MD  metoprolol succinate (TOPROL-XL) 100 MG 24 hr tablet Take 1 tablet (100 mg total) by mouth daily. Patient not taking: Reported on 04/25/2015 02/23/12   Minus Breeding, MD   Physical Exam: Blood pressure 163/90, pulse 96, temperature 97.6 F (36.4 C), temperature source Oral, resp. rate 20, height 6' (1.829 m), weight 100 kg (220 lb 7.4 oz), SpO2 93 %. Filed Vitals:   04/30/15 0737 04/30/15 1116 04/30/15 2210 05/01/15 0637  BP: 140/88 150/81 148/76 163/90  Pulse: 96 80 84 96  Temp: 97.9 F (36.6 C) 97.2 F (36.2 C) 98.8 F (37.1 C) 97.6 F (36.4 C)  TempSrc: Oral Oral Oral Oral  Resp: 31 25 24 20   Height:      Weight:      SpO2: 90% 96% 97% 93%     General:  A/O 2 (does not know where, why) confused and thinks in Hopsital No acute respiratory distress Eyes: Negative headache, eye pain, double vision,negative scleral hemorrhage ENT: Negative Runny nose, negative ear pain, negative gingival bleeding, Neck:  Negative scars, masses, torticollis, lymphadenopathy, JVD Lungs: Clear to auscultation bilaterally without wheezes or crackles Cardiovascular: Tachycardic, Regular rhythm without murmur gallop or rub normal S1 and S2 Abdomen:negative abdominal pain, nondistended, positive soft, bowel sounds, no rebound, no ascites, no appreciable mass Extremities: No significant cyanosis, clubbing, or edema bilateral lower extremities    Labs on Admission:  Basic Metabolic Panel:  Recent Labs Lab 04/27/15 0317 04/28/15 0529 04/29/15 0320 04/30/15 0336 05/01/15 0445  NA 137 139 136 140 138   K 3.7 3.6 3.3* 3.1* 3.0*  CL 101 103 104 108 107  CO2 27 27 21* 19* 21*  GLUCOSE 149* 110* 103* 104* 94  BUN 13 13 15 12 11   CREATININE 1.23 1.18 0.96 1.04 1.00  CALCIUM 8.8* 8.6* 8.9 9.3 8.6*  MG  --  1.7 1.7  --   --   PHOS  --   --   --  2.5  --  Liver Function Tests:  Recent Labs Lab 04/25/15 2016 04/28/15 0529 04/29/15 0320 04/30/15 0336  AST 47* 21 19  --   ALT 118* 45 38  --   ALKPHOS 175* 84 81  --   BILITOT 0.9 0.6 0.8  --   PROT 7.2 5.6* 6.3*  --   ALBUMIN 4.1 2.7* 2.8* 3.1*    Recent Labs Lab 04/25/15 2016  LIPASE 22    Recent Labs Lab 04/29/15 1224  AMMONIA 56*   CBC:  Recent Labs Lab 04/26/15 0540 04/27/15 0317 04/28/15 0529 04/29/15 0320 04/30/15 0336  WBC 20.3* 15.8* 10.9* 10.1 10.1  NEUTROABS  --   --  8.3* 7.0  --   HGB 11.9* 11.0* 10.8* 11.8* 12.5*  HCT 37.4* 35.0* 34.1* 36.8* 38.3*  MCV 86.6 87.3 86.8 86.6 84.7  PLT 222 210 218 255 294   Cardiac Enzymes: No results for input(s): CKTOTAL, CKMB, CKMBINDEX, TROPONINI in the last 168 hours. BNP: Invalid input(s): POCBNP CBG:  Recent Labs Lab 04/30/15 1120 04/30/15 1716 04/30/15 1946 04/30/15 2344 05/01/15 0437  GLUCAP 183* 124* 156* 129* 105*    Radiological Exams on Admission: No results found.  EKG: N/A  Time spent: 35 minutes  [facility] Pager [phone]  If 7PM-7AM, please contact night-coverage [email or web address] Password [email or web address] 05/01/2015, 8:09 AM

## 2015-05-01 NOTE — Progress Notes (Signed)
5 Days Post-Op  Subjective: Patient is in private room on the floor Awake, alert - mildly disoriented, but easily correctable Remembers the name of his surgeon, month, year  Objective: Vital signs in last 24 hours: Temp:  [97.6 F (36.4 C)-98.8 F (37.1 C)] 97.6 F (36.4 C) (03/15 0637) Pulse Rate:  [84-96] 96 (03/15 0637) Resp:  [20-24] 20 (03/15 0637) BP: (148-163)/(76-90) 163/90 mmHg (03/15 0637) SpO2:  [93 %-97 %] 93 % (03/15 0637) Last BM Date: 04/30/15  Intake/Output from previous day: 03/14 0701 - 03/15 0700 In: 1704 [P.O.:960; I.V.:694; IV Piggyback:50] Out: 2400 [Urine:1450; Stool:950] Intake/Output this shift:    General appearance: alert, cooperative and no distress Resp: clear to auscultation bilaterally Cardio: regular rate and rhythm, S1, S2 normal, no murmur, click, rub or gallop GI: soft, LLQ ostomy with some liquid stool/ flatus Midline VAC with good seal - changed this morning.  Lab Results:   Recent Labs  04/29/15 0320 04/30/15 0336  WBC 10.1 10.1  HGB 11.8* 12.5*  HCT 36.8* 38.3*  PLT 255 294   BMET  Recent Labs  04/30/15 0336 05/01/15 0445  NA 140 138  K 3.1* 3.0*  CL 108 107  CO2 19* 21*  GLUCOSE 104* 94  BUN 12 11  CREATININE 1.04 1.00  CALCIUM 9.3 8.6*   PT/INR No results for input(s): LABPROT, INR in the last 72 hours. ABG No results for input(s): PHART, HCO3 in the last 72 hours.  Invalid input(s): PCO2, PO2  Studies/Results: No results found.  Anti-infectives: Anti-infectives    Start     Dose/Rate Route Frequency Ordered Stop   04/26/15 0600  piperacillin-tazobactam (ZOSYN) IVPB 3.375 g     3.375 g 12.5 mL/hr over 240 Minutes Intravenous 3 times per day 04/26/15 0330     04/25/15 2145  piperacillin-tazobactam (ZOSYN) IVPB 3.375 g     3.375 g 12.5 mL/hr over 240 Minutes Intravenous  Once 04/25/15 2130 04/26/15 0218      Assessment/Plan: s/p Procedure(s): EXPLORATORY LAPAROTOMY PARTIAL COLECTOMY WITH COLOSTOMY  AND HARTMANN'S (N/A) Perforated diverticulitis POD#5 exploratory laparotomy, hartmann's procedure---Dr. Grandville Silos  -stable. Mobilize to aid in recovery, PT eval/treat -advance diet to regular diet -WOC consult -VAC changes M-W-F - will change to NS wet to dry for discharge -pathology-perforated diverticulitis, no malignancy  DM II-SSI/SBs HTN-lopressor  Hypothyroidism-home meds EtOH withdrawal-CIWA protocol. Appreciate medicine management. decrease haldol FEN-advance to regular diet, supplements. Hypokalemia-5mEq KCL, AM labs ID-zosyn D#5/7 VTE prophylaxis-SCD/lovenox Dispo-Floor - will need HHN for dressing changes/ ostomy care  LOS: 5 days    Dorothyann Mourer K. 05/01/2015

## 2015-05-02 DIAGNOSIS — R451 Restlessness and agitation: Secondary | ICD-10-CM

## 2015-05-02 LAB — GLUCOSE, CAPILLARY
GLUCOSE-CAPILLARY: 91 mg/dL (ref 65–99)
GLUCOSE-CAPILLARY: 99 mg/dL (ref 65–99)
Glucose-Capillary: 103 mg/dL — ABNORMAL HIGH (ref 65–99)
Glucose-Capillary: 90 mg/dL (ref 65–99)
Glucose-Capillary: 91 mg/dL (ref 65–99)
Glucose-Capillary: 94 mg/dL (ref 65–99)

## 2015-05-02 LAB — BASIC METABOLIC PANEL
ANION GAP: 9 (ref 5–15)
BUN: 7 mg/dL (ref 6–20)
CO2: 22 mmol/L (ref 22–32)
Calcium: 8.6 mg/dL — ABNORMAL LOW (ref 8.9–10.3)
Chloride: 106 mmol/L (ref 101–111)
Creatinine, Ser: 0.96 mg/dL (ref 0.61–1.24)
GFR calc Af Amer: >60 mL/min (ref >60)
GFR calc non Af Amer: >60 mL/min (ref >60)
GLUCOSE: 121 mg/dL — AB (ref 65–99)
POTASSIUM: 3.2 mmol/L — AB (ref 3.5–5.1)
Sodium: 137 mmol/L (ref 135–145)

## 2015-05-02 MED ORDER — HALOPERIDOL LACTATE 5 MG/ML IJ SOLN
7.0000 mg | Freq: Once | INTRAMUSCULAR | Status: AC
  Administered 2015-05-02: 7 mg via INTRAVENOUS
  Filled 2015-05-02: qty 2

## 2015-05-02 MED ORDER — POTASSIUM CHLORIDE CRYS ER 20 MEQ PO TBCR
40.0000 meq | EXTENDED_RELEASE_TABLET | Freq: Two times a day (BID) | ORAL | Status: AC
  Administered 2015-05-02 (×2): 40 meq via ORAL
  Filled 2015-05-02 (×2): qty 2

## 2015-05-02 NOTE — Progress Notes (Signed)
Patient awake alert and pleasant   Haldol cut back to daily 7mg  [today] and then stop. From my perspective, clear for d/c as long as no other mental state issues.  Can replace K orally when able.

## 2015-05-02 NOTE — Care Management Note (Signed)
Case Management Note  Patient Details  Name: MACIEL WYLLIE MRN: [id number] Date of Birth: 27-Nov-1945  Subjective/Objective:                    Action/Plan:  Confirmed face sheet information with patient and wife at bedside . Patient's wife Sunday Spillers cell is [phone]. Patient already has walker 3 in 1 at home . Expected Discharge Date:                  Expected Discharge Plan:  [place]  In-House Referral:     Discharge planning Services  CM Consult  Post Acute Care Choice:    Choice offered to:  Patient, Spouse  DME Arranged:    DME Agency:     HH Arranged:  RN, PT [place] Agency:  [place]  Status of Service:  Completed, signed off  Medicare Important Message Given:    Date Medicare IM Given:    Medicare IM give by:    Date Additional Medicare IM Given:    Additional Medicare Important Message give by:     If discussed at [place] of Stay Meetings, dates discussed:    Additional Comments:  Marilu Favre, RN 05/02/2015, 11:56 AM

## 2015-05-02 NOTE — Consult Note (Addendum)
WOC ostomy consult note Stoma type/location: Colostomy surgery performed on 3/10 to LLQ.  Stomal assessment/size: Stoma red and viable, flush with skin level, 1 3/4 inches Peristomal assessment: Intact skin surrounding Treatment options for stomal/peristomal skin: Applied barrier ring to maintain seal Output: Mod amt brown liquid stool Ostomy pouching: 1pc. Education provided: Demonstrated pouch change procedure. Pt plans to discharge home tomorrow, according to surgical team and will have home health assistance.  Wife at bedside for teaching session, she was able to apply pouch and barrier ring with assistance and open and close velcro to empty.  Pt is also able to open and close the opening and appears alert and oriented today, and asks appropriate questions.  Reviewed pouching routines and ordering supplies. Educational materials left in the room, extra supplies at bedside. Enrolled patient in [place] program: Yes Julien Girt MSN, RN, [place], [place], [place]

## 2015-05-02 NOTE — Progress Notes (Signed)
Patient ID: [name] [name], male   DOB: [birth year], 70 y.o.   MRN: [id number]     [address] [place], [place] [address]    Phone: [phone] FAX: [phone]     Subjective: Alert, awake, oriented.  Having ostomy function.  Afebrile.  VSS.  Objective:  Vital signs:  Filed Vitals:   05/01/15 1445 05/01/15 2049 05/02/15 0139 05/02/15 0620  BP: 141/70 152/89 138/86 176/84  Pulse: 93 92 80 75  Temp: 97.7 F (36.5 C) 97.7 F (36.5 C) 98.1 F (36.7 C) 97.7 F (36.5 C)  TempSrc: Oral Oral  Oral  Resp: _0 Height:      Weight:      SpO2: 97% 97% 95% 97%    Last BM Date: 05/01/15  Intake/Output   Yesterday:  03/15 0701 - 03/16 0700 In: 1258.5 [P.O.:240; I.V.:1018.5] Out: 1500 [Urine:1300; Stool:200] This shift:    Physical Exam: General: Pt awake/alert/oriented x4 in no acute distress Chest: cta. Diminished BLL.  CV: Pulses intact. Regular rhythm MS: Normal AROM mjr joints. No obvious deformity Abdomen: Soft. Nondistended.tender. VAC in place. llq ostomy is viable and functioning. No evidence of peritonitis. No incarcerated hernias. Ext: SCDs BLE. No mjr edema. No cyanosis Skin: No petechiae / purpura    Problem List:   Active Problems:   Hypertension   DM (diabetes mellitus), type 2 (HCC)   AKI (acute kidney injury) ([place])   Diverticulitis   Perforated diverticulum of large intestine   Diverticulitis of colon with perforation   Depression   GERD (gastroesophageal reflux disease)   Hypothyroidism   Diverticulitis of large intestine with perforation without bleeding   Type 2 diabetes mellitus with complication (HCC)   CAD in native artery   Abdominal pain   Agitation   Threatening to others    Results:   Labs: Results for orders placed or performed during the hospital encounter of 04/25/15 (from the past 48 hour(s))  Glucose, capillary     Status: Abnormal    Collection Time: 04/30/15 11:20 AM  Result Value Ref Range   Glucose-Capillary 183 (H) 65 - 99 mg/dL   Comment 1 Capillary Specimen   Glucose, capillary     Status: Abnormal   Collection Time: 04/30/15  5:16 PM  Result Value Ref Range   Glucose-Capillary 124 (H) 65 - 99 mg/dL   Comment 1 Notify RN   Glucose, capillary     Status: Abnormal   Collection Time: 04/30/15  7:46 PM  Result Value Ref Range   Glucose-Capillary 156 (H) 65 - 99 mg/dL  Glucose, capillary     Status: Abnormal   Collection Time: 04/30/15 11:44 PM  Result Value Ref Range   Glucose-Capillary 129 (H) 65 - 99 mg/dL  Glucose, capillary     Status: Abnormal   Collection Time: 05/01/15  4:37 AM  Result Value Ref Range   Glucose-Capillary 105 (H) 65 - 99 mg/dL  Basic metabolic panel     Status: Abnormal   Collection Time: 05/01/15  4:45 AM  Result Value Ref Range   Sodium 138 135 - 145 mmol/L   Potassium 3.0 (L) 3.5 - 5.1 mmol/L   Chloride 107 101 - 111 mmol/L   CO2 21 (L) 22 - 32 mmol/L   Glucose, Bld 94 65 - 99 mg/dL   BUN 11 6 - 20 mg/dL   Creatinine, Ser 1.00 0.61 - 1.24 mg/dL  Calcium 8.6 (L) 8.9 - 10.3 mg/dL   GFR calc non Af Amer >60 >60 mL/min   GFR calc Af Amer >60 >60 mL/min    Comment: (NOTE) The eGFR has been calculated using the CKD EPI equation. This calculation has not been validated in all clinical situations. eGFR's persistently <60 mL/min signify possible Chronic Kidney Disease.    Anion gap 10 5 - 15  Glucose, capillary     Status: Abnormal   Collection Time: 05/01/15  8:24 AM  Result Value Ref Range   Glucose-Capillary 107 (H) 65 - 99 mg/dL  Glucose, capillary     Status: Abnormal   Collection Time: 05/01/15 12:36 PM  Result Value Ref Range   Glucose-Capillary 104 (H) 65 - 99 mg/dL  Glucose, capillary     Status: Abnormal   Collection Time: 05/01/15  2:15 PM  Result Value Ref Range   Glucose-Capillary 166 (H) 65 - 99 mg/dL  Glucose, capillary     Status: Abnormal   Collection  Time: 05/01/15  4:16 PM  Result Value Ref Range   Glucose-Capillary 124 (H) 65 - 99 mg/dL  Glucose, capillary     Status: Abnormal   Collection Time: 05/01/15  8:02 PM  Result Value Ref Range   Glucose-Capillary 122 (H) 65 - 99 mg/dL  Glucose, capillary     Status: None   Collection Time: 05/02/15 12:12 AM  Result Value Ref Range   Glucose-Capillary 91 65 - 99 mg/dL  Glucose, capillary     Status: None   Collection Time: 05/02/15  4:12 AM  Result Value Ref Range   Glucose-Capillary 90 65 - 99 mg/dL   Comment 1 Notify RN    Comment 2 Document in Chart     Imaging / Studies: No results found.  Medications / Allergies:  Scheduled Meds: . allopurinol  300 mg Oral QPM  . clonazePAM  1 mg Oral BID  . enoxaparin (LOVENOX) injection  40 mg Subcutaneous Q24H  . feeding supplement  1 Container Oral TID BM  . folic acid  1 mg Oral Daily  . gabapentin  900 mg Oral QHS  . haloperidol lactate  7 mg Intravenous BID  . insulin aspart  0-9 Units Subcutaneous 6 times per day  . irbesartan  75 mg Oral Daily  . levothyroxine  75 mcg Oral QAC breakfast  . metoprolol tartrate  25 mg Oral BID  . pantoprazole  40 mg Oral Daily  . piperacillin-tazobactam (ZOSYN)  IV  3.375 g Intravenous 3 times per day  . thiamine  100 mg Oral Daily  . venlafaxine XR  225 mg Oral QHS   Continuous Infusions: . sodium chloride 1,000 mL (05/02/15 0350)   PRN Meds:.acetaminophen, fentaNYL (SUBLIMAZE) injection, fluticasone, HYDROcodone-acetaminophen, LORazepam, methocarbamol (ROBAXIN)  IV, nitroGLYCERIN  Antibiotics: Anti-infectives    Start     Dose/Rate Route Frequency Ordered Stop   04/26/15 0600  piperacillin-tazobactam (ZOSYN) IVPB 3.375 g     3.375 g 12.5 mL/hr over 240 Minutes Intravenous 3 times per day 04/26/15 0330     04/25/15 2145  piperacillin-tazobactam (ZOSYN) IVPB 3.375 g     3.375 g 12.5 mL/hr over 240 Minutes Intravenous  Once 04/25/15 2130 04/26/15 0218        Assessment/Plan: Perforated diverticulitis POD#6 exploratory laparotomy, hartmann's procedure---Dr. Grandville Silos  -stable. Mobilize to aid in recovery, PT eval/treat -WOC consult -VAC changes M-W-F - will change to NS wet to dry for discharge -pathology-perforated diverticulitis, no malignancy  DM II-SSI/CBGs HTN-lopressor  Hypothyroidism-home meds EtOH withdrawal-much improved.  Appreciate medicine management. weaning haldol FEN-regular diet, supplements. Hypokalemia-repeat BMP ID-zosyn D#6/7 VTE prophylaxis-SCD/lovenox Dispo-anticipate DC next 24h once cleared medically(need haldol weaned off).  Surgically stable for DC.  HH order placed.      Erby Pian, [facility] Pager [phone]) For consults and floor pages call [phone](7A-4:30P)  05/02/2015 8:12 AM

## 2015-05-03 LAB — GLUCOSE, CAPILLARY
GLUCOSE-CAPILLARY: 139 mg/dL — AB (ref 65–99)
Glucose-Capillary: 95 mg/dL (ref 65–99)
Glucose-Capillary: 94 mg/dL (ref 65–99)
Glucose-Capillary: 103 mg/dL — ABNORMAL HIGH (ref 65–99)
Glucose-Capillary: 169 mg/dL — ABNORMAL HIGH (ref 65–99)
Glucose-Capillary: 78 mg/dL (ref 65–99)

## 2015-05-03 LAB — BASIC METABOLIC PANEL
Anion gap: 10 (ref 5–15)
BUN: 6 mg/dL (ref 6–20)
CALCIUM: 9 mg/dL (ref 8.9–10.3)
CO2: 25 mmol/L (ref 22–32)
CREATININE: 1.05 mg/dL (ref 0.61–1.24)
Chloride: 103 mmol/L (ref 101–111)
GFR calc non Af Amer: >60 mL/min (ref >60)
Glucose, Bld: 113 mg/dL — ABNORMAL HIGH (ref 65–99)
Potassium: 3.4 mmol/L — ABNORMAL LOW (ref 3.5–5.1)
SODIUM: 138 mmol/L (ref 135–145)

## 2015-05-03 LAB — MAGNESIUM: Magnesium: 1.5 mg/dL — ABNORMAL LOW (ref 1.7–2.4)

## 2015-05-03 LAB — CBC
HCT: 38.2 % — ABNORMAL LOW (ref 39.0–52.0)
Hemoglobin: 12.3 g/dL — ABNORMAL LOW (ref 13.0–17.0)
MCH: 27.5 pg (ref 26.0–34.0)
MCHC: 32.2 g/dL (ref 30.0–36.0)
MCV: 85.5 fL (ref 78.0–100.0)
PLATELETS: 400 10*3/uL (ref 150–400)
RBC: 4.47 MIL/uL (ref 4.22–5.81)
RDW: 15.9 % — AB (ref 11.5–15.5)
WBC: 8.9 10*3/uL (ref 4.0–10.5)

## 2015-05-03 MED ORDER — ASPIRIN EC 325 MG PO TBEC
325.0000 mg | DELAYED_RELEASE_TABLET | Freq: Every day | ORAL | Status: DC
  Administered 2015-05-03 – 2015-05-06 (×4): 325 mg via ORAL
  Filled 2015-05-03 (×4): qty 1

## 2015-05-03 MED ORDER — LORAZEPAM 2 MG/ML IJ SOLN
1.0000 mg | INTRAMUSCULAR | Status: DC | PRN
Start: 2015-05-03 — End: 2015-05-03
  Administered 2015-05-03: 2 mg via INTRAVENOUS
  Filled 2015-05-03: qty 1

## 2015-05-03 MED ORDER — QUETIAPINE FUMARATE 50 MG PO TABS
25.0000 mg | ORAL_TABLET | Freq: Two times a day (BID) | ORAL | Status: DC
  Administered 2015-05-03 – 2015-05-06 (×7): 25 mg via ORAL
  Filled 2015-05-03 (×7): qty 1

## 2015-05-03 MED ORDER — POTASSIUM CHLORIDE CRYS ER 20 MEQ PO TBCR
40.0000 meq | EXTENDED_RELEASE_TABLET | Freq: Once | ORAL | Status: AC
  Administered 2015-05-03: 40 meq via ORAL
  Filled 2015-05-03: qty 2

## 2015-05-03 MED ORDER — HYDROCODONE-ACETAMINOPHEN 5-325 MG PO TABS
2.0000 | ORAL_TABLET | ORAL | Status: DC | PRN
  Administered 2015-05-03 – 2015-05-06 (×17): 2 via ORAL
  Filled 2015-05-03 (×18): qty 2

## 2015-05-03 MED ORDER — CLONAZEPAM 1 MG PO TABS
1.0000 mg | ORAL_TABLET | Freq: Three times a day (TID) | ORAL | Status: DC
  Administered 2015-05-03 – 2015-05-06 (×9): 1 mg via ORAL
  Filled 2015-05-03 (×9): qty 1

## 2015-05-03 MED ORDER — METOPROLOL TARTRATE 50 MG PO TABS
50.0000 mg | ORAL_TABLET | Freq: Two times a day (BID) | ORAL | Status: DC
  Administered 2015-05-03 – 2015-05-06 (×7): 50 mg via ORAL
  Filled 2015-05-03 (×7): qty 1

## 2015-05-03 MED ORDER — QUETIAPINE FUMARATE 50 MG PO TABS: 25.0000 mg | ORAL_TABLET | Freq: Every day | ORAL | Status: DC

## 2015-05-03 NOTE — Progress Notes (Signed)
2030 pt. Confused, disoriented ,getting out of bed ,pulled IV out, removed Condom Cath and trying to remove Vac. Wife called and was able to reorient pt. IV restarted ,condom cath replaced ,pt back to bed and quiet after Ativan and Fentanyl.

## 2015-05-03 NOTE — Progress Notes (Signed)
Pt's wife, Sunday Spillers, states that she cannot take him home at discharge. She would appreciate a call from social work and requests a SNF near her home. Her preference is [facility].

## 2015-05-03 NOTE — Progress Notes (Signed)
Physical Therapy Treatment Patient Details Name: [name] [name] MRN: [id number] DOB: 12-13-1945 Today's Date: 05/03/2015    History of Present Illness 70 yo M Hx active EtOH Abuse, HTN, MI, CAD, and Chronic Diastolic CHF who developed the sudden onset of constant lower abdominal pain and was diagnosed with perforated diverticulum in the ED; s/p Exp Lap, colostomy, Hartman's procedure     PT Comments    Patient is gradually progressing toward mobility goals. Continues to rely heavily on RW for balance/support. Continue to progress as tolerated.   Follow Up Recommendations  Home health PT;Supervision for mobility/OOB     Equipment Recommendations  None recommended by PT    Recommendations for Other Services       Precautions / Restrictions Precautions Precautions: Other (comment) Precaution Comments: wound vac    Mobility  Bed Mobility Overal bed mobility: Needs Assistance Bed Mobility: Rolling;Sidelying to Sit Rolling: Min guard Sidelying to sit: Min assist       General bed mobility comments: assist to bring bilat LE to EOB and support trunk for balance when elevating trunk into sitting; HOB slightly elevated and min use of bedrail to roll to side  Transfers Overall transfer level: Needs assistance Equipment used: Rolling walker (2 wheeled) Transfers: Sit to/from Stand Sit to Stand: Min guard         General transfer comment: min guard for safety; cues for hand placement and technique  Ambulation/Gait Ambulation/Gait assistance: Min assist Ambulation Distance (Feet): 200 Feet Assistive device: Rolling walker (2 wheeled) Gait Pattern/deviations: Step-through pattern;Trunk flexed;Drifts right/left (drifts L > R) Gait velocity: decreased   General Gait Details: cues for posture and position of RW; pt with tendency to push RW far away from him; assist to guide RW at times with pt drifting to L often especially with head turns   Network engineer Rankin (Stroke Patients Only)       Balance Overall balance assessment: Needs assistance   Sitting balance-Leahy Scale: Good       Standing balance-Leahy Scale: Poor                      Cognition Arousal/Alertness: Awake/alert Behavior During Therapy: WFL for tasks assessed/performed Overall Cognitive Status: Within Functional Limits for tasks assessed                      Exercises      General Comments        Pertinent Vitals/Pain Pain Assessment: No/denies pain Pain Intervention(s): Monitored during session;Premedicated before session    Home Living                      Prior Function            PT Goals (current goals can now be found in the care plan section) Acute Rehab PT Goals Patient Stated Goal: be able to walk independently PT Goal Formulation: With patient Time For Goal Achievement: 05/15/15 Potential to Achieve Goals: Good Progress towards PT goals: Progressing toward goals    Frequency  Min 3X/week    PT Plan Current plan remains appropriate    Co-evaluation             End of Session Equipment Utilized During Treatment: Gait belt (placed high) Activity Tolerance: Patient limited by pain;Patient tolerated treatment well Patient left: with call bell/phone within reach;in chair;with  chair alarm set     Time: [phone] PT Time Calculation (min) (ACUTE ONLY): 28 min  Charges:  $Gait Training: 8-22 mins $Therapeutic Activity: 8-22 mins                    G Codes:      Salina April, PTA Pager: [phone]   05/03/2015, 4:38 PM

## 2015-05-03 NOTE — Progress Notes (Signed)
Wife has again called to voice her fear of her inability to take care of her husband at home. She feels as if she can handle the physical aspect of her husband's care,  however would very much like for her husband's mental status to improve.

## 2015-05-03 NOTE — Progress Notes (Signed)
Patient ID: [name] [name], male   DOB: 05/12/1945, 70 y.o.   MRN: [id number]     [address] [place], [place] [address]    Phone: [phone] FAX: [phone]     Subjective: Pulled out iv, condom cath, agitated overnight. BP high.    Objective:  Vital signs:  Filed Vitals:   05/02/15 0620 05/02/15 1412 05/02/15 2218 05/03/15 0726  BP: 176/84 147/80 166/94 184/90  Pulse: 75 88 89 75  Temp: 97.7 F (36.5 C) 97.9 F (36.6 C) 98 F (36.7 C) 98 F (36.7 C)  TempSrc: Oral Oral Oral Oral  Resp: _0 Height:      Weight:      SpO2: 97% 98% 96% 98%    Last BM Date: 05/01/15  Intake/Output   Yesterday:  03/16 0701 - 03/17 0700 In: 1080 [P.O.:480; I.V.:600] Out: 3670 [Urine:3275; Stool:395] This shift:  Total I/O In: -  Out: 700 [Urine:700]   Physical Exam: General: Pt awake, alert to person and place.  Chest: cta.  CV: Pulses intact. Regular rhythm MS: Normal AROM mjr joints. No obvious deformity Abdomen: Soft. Nondistended.tender. VAC in place. llq ostomy is viable and functioning. No evidence of peritonitis. No incarcerated hernias. Ext: SCDs BLE. No mjr edema. No cyanosis Skin: No petechiae / purpura   Problem List:   Active Problems:   Hypertension   DM (diabetes mellitus), type 2 (HCC)   AKI (acute kidney injury) ([place])   Diverticulitis   Perforated diverticulum of large intestine   Diverticulitis of colon with perforation   Depression   GERD (gastroesophageal reflux disease)   Hypothyroidism   Diverticulitis of large intestine with perforation without bleeding   Type 2 diabetes mellitus with complication (HCC)   CAD in native artery   Abdominal pain   Agitation   Threatening to others    Results:   Labs: Results for orders placed or performed during the hospital encounter of 04/25/15 (from the past 82 hour(s))  Glucose, capillary     Status: Abnormal   Collection Time: 05/01/15  8:24 AM  Result Value Ref Range   Glucose-Capillary 107 (H) 65 - 99 mg/dL  Glucose, capillary     Status: Abnormal   Collection Time: 05/01/15 12:36 PM  Result Value Ref Range   Glucose-Capillary 104 (H) 65 - 99 mg/dL  Glucose, capillary     Status: Abnormal   Collection Time: 05/01/15  2:15 PM  Result Value Ref Range   Glucose-Capillary 166 (H) 65 - 99 mg/dL  Glucose, capillary     Status: Abnormal   Collection Time: 05/01/15  4:16 PM  Result Value Ref Range   Glucose-Capillary 124 (H) 65 - 99 mg/dL  Glucose, capillary     Status: Abnormal   Collection Time: 05/01/15  8:02 PM  Result Value Ref Range   Glucose-Capillary 122 (H) 65 - 99 mg/dL  Glucose, capillary     Status: None   Collection Time: 05/02/15 12:12 AM  Result Value Ref Range   Glucose-Capillary 91 65 - 99 mg/dL  Glucose, capillary     Status: None   Collection Time: 05/02/15  4:12 AM  Result Value Ref Range   Glucose-Capillary 90 65 - 99 mg/dL   Comment 1 Notify RN    Comment 2 Document in Chart   Glucose, capillary     Status: None   Collection Time: 05/02/15  7:43 AM  Result Value Ref Range   Glucose-Capillary 91 65 - 99 mg/dL   Comment 1 Repeat Test   Basic metabolic panel     Status: Abnormal   Collection Time: 05/02/15 10:04 AM  Result Value Ref Range   Sodium 137 135 - 145 mmol/L   Potassium 3.2 (L) 3.5 - 5.1 mmol/L   Chloride 106 101 - 111 mmol/L   CO2 22 22 - 32 mmol/L   Glucose, Bld 121 (H) 65 - 99 mg/dL   BUN 7 6 - 20 mg/dL   Creatinine, Ser 0.96 0.61 - 1.24 mg/dL   Calcium 8.6 (L) 8.9 - 10.3 mg/dL   GFR calc non Af Amer >60 >60 mL/min   GFR calc Af Amer >60 >60 mL/min    Comment: (NOTE) The eGFR has been calculated using the CKD EPI equation. This calculation has not been validated in all clinical situations. eGFR's persistently <60 mL/min signify possible Chronic Kidney Disease.    Anion gap 9 5 - 15  Glucose, capillary     Status: Abnormal   Collection Time:  05/02/15 11:35 AM  Result Value Ref Range   Glucose-Capillary 103 (H) 65 - 99 mg/dL   Comment 1 Repeat Test   Glucose, capillary     Status: None   Collection Time: 05/02/15  4:42 PM  Result Value Ref Range   Glucose-Capillary 99 65 - 99 mg/dL  Glucose, capillary     Status: None   Collection Time: 05/02/15  8:07 PM  Result Value Ref Range   Glucose-Capillary 94 65 - 99 mg/dL  Glucose, capillary     Status: Abnormal   Collection Time: 05/03/15  1:57 AM  Result Value Ref Range   Glucose-Capillary 139 (H) 65 - 99 mg/dL  Glucose, capillary     Status: None   Collection Time: 05/03/15  4:41 AM  Result Value Ref Range   Glucose-Capillary 95 65 - 99 mg/dL  Glucose, capillary     Status: None   Collection Time: 05/03/15  7:50 AM  Result Value Ref Range   Glucose-Capillary 94 65 - 99 mg/dL   Comment 1 Notify RN     Imaging / Studies: No results found.  Medications / Allergies:  Scheduled Meds: . allopurinol  300 mg Oral QPM  . clonazePAM  1 mg Oral BID  . enoxaparin (LOVENOX) injection  40 mg Subcutaneous Q24H  . feeding supplement  1 Container Oral TID BM  . folic acid  1 mg Oral Daily  . gabapentin  900 mg Oral QHS  . insulin aspart  0-9 Units Subcutaneous 6 times per day  . irbesartan  75 mg Oral Daily  . levothyroxine  75 mcg Oral QAC breakfast  . metoprolol tartrate  25 mg Oral BID  . pantoprazole  40 mg Oral Daily  . QUEtiapine  25 mg Oral QHS  . thiamine  100 mg Oral Daily  . venlafaxine XR  225 mg Oral QHS   Continuous Infusions: . sodium chloride 1,000 mL (05/02/15 0350)   PRN Meds:.acetaminophen, fentaNYL (SUBLIMAZE) injection, fluticasone, HYDROcodone-acetaminophen, methocarbamol (ROBAXIN)  IV, nitroGLYCERIN  Antibiotics: Anti-infectives    Start     Dose/Rate Route Frequency Ordered Stop   04/26/15 0600  piperacillin-tazobactam (ZOSYN) IVPB 3.375 g  Status:  Discontinued     3.375 g 12.5 mL/hr over 240 Minutes Intravenous 3 times per day 04/26/15 0330  05/03/15 0806   04/25/15 2145  piperacillin-tazobactam (ZOSYN) IVPB 3.375 g     3.375 g 12.5  mL/hr over 240 Minutes Intravenous  Once 04/25/15 2130 04/26/15 6979         Assessment/Plan: Perforated diverticulitis POD#7 exploratory laparotomy, hartmann's procedure---Dr. Grandville Silos  -stable. Mobilize to aid in recovery, PT eval/treat -WOC consult -VAC changes M-W-F - will change to NS wet to dry for discharge -pathology-perforated diverticulitis, no malignancy  DM II-SSI/CBGs HTN-increase metorpolol. Hypothyroidism-home meds EtOH withdrawal/agitation-worsened after haldol stopped.  Check labs, UA.  May consider CTH, but I suspect this is alcohol related.  continue clonazepam, will consider increase to TID. Start seroquel. FEN-regular diet, supplements. Hypokalemia-check BMP ID-zosyn D#7/7 VTE prophylaxis-SCD/lovenox Dispo-PT recs HH PT.  Due to patients agitation, not ready for DC.  Will see if he qualifies for SNF   Erby Pian, [facility] Pager [phone](7A-4:30P) For consults and floor pages call [phone](7A-4:30P)  05/03/2015 8:13 AM

## 2015-05-04 LAB — URINALYSIS, ROUTINE W REFLEX MICROSCOPIC
BILIRUBIN URINE: NEGATIVE
Glucose, UA: NEGATIVE mg/dL
Hgb urine dipstick: NEGATIVE
Ketones, ur: NEGATIVE mg/dL
Leukocytes, UA: NEGATIVE
NITRITE: NEGATIVE
Protein, ur: 30 mg/dL — AB
SPECIFIC GRAVITY, URINE: 1.012 (ref 1.005–1.030)
pH: 6 (ref 5.0–8.0)

## 2015-05-04 LAB — URINE MICROSCOPIC-ADD ON
RBC / HPF: NONE SEEN RBC/hpf (ref 0–5)
WBC, UA: NONE SEEN WBC/hpf (ref 0–5)

## 2015-05-04 LAB — GLUCOSE, CAPILLARY
GLUCOSE-CAPILLARY: 111 mg/dL — AB (ref 65–99)
GLUCOSE-CAPILLARY: 115 mg/dL — AB (ref 65–99)
GLUCOSE-CAPILLARY: 99 mg/dL (ref 65–99)
GLUCOSE-CAPILLARY: 114 mg/dL — AB (ref 65–99)
Glucose-Capillary: 89 mg/dL (ref 65–99)
Glucose-Capillary: 122 mg/dL — ABNORMAL HIGH (ref 65–99)

## 2015-05-04 MED ORDER — INSULIN ASPART 100 UNIT/ML ~~LOC~~ SOLN
0.0000 [IU] | Freq: Three times a day (TID) | SUBCUTANEOUS | Status: DC
  Administered 2015-05-04: 1 [IU] via SUBCUTANEOUS

## 2015-05-04 MED ORDER — INSULIN ASPART 100 UNIT/ML ~~LOC~~ SOLN: 0.0000 [IU] | Freq: Three times a day (TID) | SUBCUTANEOUS | Status: DC

## 2015-05-04 NOTE — Progress Notes (Signed)
CSW received consult for SNF placement. CSW spoke with patient to confirm that he plans to discharge home with advance home care services.   No further CSW needs identified - CSW signing off.   Raynaldo Opitz, [facility] Clinical Social Worker cell #: [phone]

## 2015-05-04 NOTE — Progress Notes (Signed)
8 Days Post-Op  Subjective: Awake Following commands Seems appropriate Working with PT  Objective: Vital signs in last 24 hours: Temp:  [97.4 F (36.3 C)-98.6 F (37 C)] 98.6 F (37 C) (03/18 0527) Pulse Rate:  [70-82] 71 (03/18 0527) Resp:  [18] 18 (03/18 0527) BP: (135-166)/(80-87) 163/87 mmHg (03/18 0527) SpO2:  [97 %-99 %] 97 % (03/18 0527) Last BM Date: 05/03/15  Intake/Output from previous day: 03/17 0701 - 03/18 0700 In: 1163.8 [P.O.:480; I.V.:683.8] Out: 4150 [Urine:3750; Drains:50; Stool:350] Intake/Output this shift:    Lungs clear CV RRR Abd soft, ostomy working well, VAC in place  Lab Results:   Recent Labs  05/03/15 1140  WBC 8.9  HGB 12.3*  HCT 38.2*  PLT 400   BMET  Recent Labs  05/02/15 1004 05/03/15 1140  NA 137 138  K 3.2* 3.4*  CL 106 103  CO2 22 25  GLUCOSE 121* 113*  BUN 7 6  CREATININE 0.96 1.05  CALCIUM 8.6* 9.0   PT/INR No results for input(s): LABPROT, INR in the last 72 hours. ABG No results for input(s): PHART, HCO3 in the last 72 hours.  Invalid input(s): PCO2, PO2  Studies/Results: No results found.  Anti-infectives: Anti-infectives    Start     Dose/Rate Route Frequency Ordered Stop   04/26/15 0600  piperacillin-tazobactam (ZOSYN) IVPB 3.375 g  Status:  Discontinued     3.375 g 12.5 mL/hr over 240 Minutes Intravenous 3 times per day 04/26/15 0330 05/03/15 0806   04/25/15 2145  piperacillin-tazobactam (ZOSYN) IVPB 3.375 g     3.375 g 12.5 mL/hr over 240 Minutes Intravenous  Once 04/25/15 2130 04/26/15 0218      Assessment/Plan: s/p Procedure(s): EXPLORATORY LAPAROTOMY PARTIAL COLECTOMY WITH COLOSTOMY AND HARTMANN'S (N/A)  Continuing PT, antibiotics, current care Working toward home vs SNF  LOS: 8 days    Kyrielle Urbanski A 05/04/2015

## 2015-05-05 LAB — GLUCOSE, CAPILLARY
GLUCOSE-CAPILLARY: 86 mg/dL (ref 65–99)
Glucose-Capillary: 92 mg/dL (ref 65–99)
Glucose-Capillary: 80 mg/dL (ref 65–99)
Glucose-Capillary: 117 mg/dL — ABNORMAL HIGH (ref 65–99)

## 2015-05-05 NOTE — Care Management Important Message (Signed)
Important Message  Patient Details  Name: [name] [name] MRN: [id number] Date of Birth: 1945/05/17   Medicare Important Message Given:  Yes    Apolonio Schneiders, RN 05/05/2015, 10:50 AM

## 2015-05-05 NOTE — Progress Notes (Signed)
9 Days Post-Op  Subjective: Continues to slowly improve   Objective: Vital signs in last 24 hours: Temp:  [98.3 F (36.8 C)-98.5 F (36.9 C)] 98.3 F (36.8 C) (03/19 0526) Pulse Rate:  [72-77] 74 (03/19 0526) Resp:  [17-18] 17 (03/19 0526) BP: (154-164)/(68-79) 154/79 mmHg (03/19 0526) SpO2:  [93 %-98 %] 94 % (03/19 0526) Last BM Date: 05/03/15  Intake/Output from previous day: 03/18 0701 - 03/19 0700 In: 65 [P.O.:820] Out: 3000 [Urine:2850; Stool:150] Intake/Output this shift:    Mental status more clear Abdomen soft, VAC in place, non tender  Lab Results:   Recent Labs  05/03/15 1140  WBC 8.9  HGB 12.3*  HCT 38.2*  PLT 400   BMET  Recent Labs  05/02/15 1004 05/03/15 1140  NA 137 138  K 3.2* 3.4*  CL 106 103  CO2 22 25  GLUCOSE 121* 113*  BUN 7 6  CREATININE 0.96 1.05  CALCIUM 8.6* 9.0   PT/INR No results for input(s): LABPROT, INR in the last 72 hours. ABG No results for input(s): PHART, HCO3 in the last 72 hours.  Invalid input(s): PCO2, PO2  Studies/Results: No results found.  Anti-infectives: Anti-infectives    Start     Dose/Rate Route Frequency Ordered Stop   04/26/15 0600  piperacillin-tazobactam (ZOSYN) IVPB 3.375 g  Status:  Discontinued     3.375 g 12.5 mL/hr over 240 Minutes Intravenous 3 times per day 04/26/15 0330 05/03/15 0806   04/25/15 2145  piperacillin-tazobactam (ZOSYN) IVPB 3.375 g     3.375 g 12.5 mL/hr over 240 Minutes Intravenous  Once 04/25/15 2130 04/26/15 0218      Assessment/Plan: s/p Procedure(s): EXPLORATORY LAPAROTOMY PARTIAL COLECTOMY WITH COLOSTOMY AND HARTMANN'S (N/A)  Hopefully discharge tomorrow with home health without the VAC  LOS: 9 days    Anani Gu A 05/05/2015

## 2015-05-06 LAB — GLUCOSE, CAPILLARY: Glucose-Capillary: 89 mg/dL (ref 65–99)

## 2015-05-06 MED ORDER — HYDROCODONE-ACETAMINOPHEN 5-325 MG PO TABS: 2.0000 | ORAL_TABLET | ORAL | Status: DC | PRN

## 2015-05-06 MED ORDER — QUETIAPINE FUMARATE 25 MG PO TABS: 25.0000 mg | ORAL_TABLET | Freq: Two times a day (BID) | ORAL | Status: DC

## 2015-05-06 NOTE — Discharge Instructions (Signed)
seroquel was started to help with agitation--needs follow up with primary care doctor to determine whether this can be reduced and then stopped.      WOUND CARE  It is important that the wound be kept open.   -Keeping the skin edges apart will allow the wound to gradually heal from the base upwards.   - If the skin edges of the wound close too early, a new fluid pocket can form and infection can occur. -This is the reason to pack deeper wounds with gauze or ribbon -This is why drained wounds cannot be sewed closed right away  A healthy wound should form a lining of bright red "beefy" granulating tissue that will help shrink the wound and help the edges grow new skin into it.   -A little mucus / yellow discharge is normal (the body's natural way to try and form a scab) and should be gently washed off with soap and water with daily dressing changes.  -Green or foul smelling drainage implies bacterial colonization and can slow wound healing - a short course of antibiotic ointment (3-5 days) can help it clear up.  Call the doctor if it does not improve or worsens  -Avoid use of antibiotic ointments for more than a week as they can slow wound healing over time.    -Sometimes other wound care products will be used to reduce need for dressing changes and/or help clean up dirty wounds -Sometimes the surgeon needs to debride the wound in the office to remove dead or infected tissue out of the wound so it can heal more quickly and safely.    Change the dressing at least once a day -Wash the wound with mild soap and water gently every day.  It is good to shower or bathe the wound to help it clean out. -Use clean 4x4 gauze for medium/large wounds or ribbon plain NU-gauze for smaller wounds (it does not need to be sterile, just clean) -Keep the raw wound moist with a little saline or KY (saline) gel on the gauze.  -A dry wound will take longer to heal.  -Keep the skin dry around the wound to prevent  breakdown and irritation. -Pack the wound down to the base -The goal is to keep the skin apart, not overpack the wound -Use a Q-tip or blunt-tipped kabob stick toothpick to push the gauze down to the base in narrow or deep wounds   -Cover with a clean gauze and tape -paper or Medipore tape tend to be gentle on the skin -rotate the orientation of the tape to avoid repeated stress/trauma on the skin -using an ACE or Coban wrap on wounds on arms or legs can be used instead.  Complete all antibiotics through the entire prescription to help the infection heal and prevent new places of infection   Returning the see the surgeon is helpful to follow the healing process and help the wound close as fast as possible.  ABDOMINAL SURGERY: POST OP INSTRUCTIONS  1. DIET: Follow a light bland diet the first 24 hours after arrival home, such as soup, liquids, crackers, etc.  Be sure to include lots of fluids daily.  Avoid fast food or heavy meals as your are more likely to get nauseated.  Eat a low fat the next few days after surgery.   2. Take your usually prescribed home medications unless otherwise directed. 3. PAIN CONTROL: a. Pain is best controlled by a usual combination of three different methods TOGETHER: i. Ice/Heat  ii. Over the counter pain medication iii. Prescription pain medication b. Most patients will experience some swelling and bruising around the incisions.  Ice packs or heating pads (30-60 minutes up to 6 times a day) will help. Use ice for the first few days to help decrease swelling and bruising, then switch to heat to help relax tight/sore spots and speed recovery.  Some people prefer to use ice alone, heat alone, alternating between ice & heat.  Experiment to what works for you.  Swelling and bruising can take several weeks to resolve.   c. It is helpful to take an over-the-counter pain medication regularly for the first few weeks.  Choose one of the following that works best for  you: i. Naproxen (Aleve, etc)  Two 220mg  tabs twice a day ii. Ibuprofen (Advil, etc) Three 200mg  tabs four times a day (every meal & bedtime) iii. Acetaminophen (Tylenol, etc) 500-650mg  four times a day (every meal & bedtime) d. A  prescription for pain medication (such as oxycodone, hydrocodone, etc) should be given to you upon discharge.  Take your pain medication as prescribed.  i. If you are having problems/concerns with the prescription medicine (does not control pain, nausea, vomiting, rash, itching, etc), please call us [phone] to see if we need to switch you to a different pain medicine that will work better for you and/or control your side effect better. ii. If you need a refill on your pain medication, please contact your pharmacy.  They will contact our office to request authorization. Prescriptions will not be filled after 5 pm or on week-ends. 4. Avoid getting constipated.  Between the surgery and the pain medications, it is common to experience some constipation.  Increasing fluid intake and taking a fiber supplement (such as Metamucil, Citrucel, FiberCon, MiraLax, etc) 1-2 times a day regularly will usually help prevent this problem from occurring.  A mild laxative (prune juice, Milk of Magnesia, MiraLax, etc) should be taken according to package directions if there are no bowel movements after 48 hours.   5. Watch out for diarrhea.  If you have many loose bowel movements, simplify your diet to bland foods & liquids for a few days.  Stop any stool softeners and decrease your fiber supplement.  Switching to mild anti-diarrheal medications (Kayopectate, Pepto Bismol) can help.  If this worsens or does not improve, please call us. 6. Wash / shower every day.  You may shower over the incision / wound.  Avoid baths until the skin is fully healed.  Continue to shower over incision(s) after the dressing is off. 7. Remove your waterproof bandages 5 days after surgery.  You may leave the  incision open to air.  You may replace a dressing/Band-Aid to cover the incision for comfort if you wish. 8. ACTIVITIES as tolerated:   a. You may resume regular (light) daily activities beginning the next day--such as daily self-care, walking, climbing stairs--gradually increasing activities as tolerated.  If you can walk 30 minutes without difficulty, it is safe to try more intense activity such as jogging, treadmill, bicycling, low-impact aerobics, swimming, etc. b. Save the most intensive and strenuous activity for last such as sit-ups, heavy lifting, contact sports, etc  Refrain from any heavy lifting or straining until you are off narcotics for pain control.   c. DO NOT PUSH THROUGH PAIN.  Let pain be your guide: If it hurts to do something, don't do it.  Pain is your body warning you to avoid that activity for  another week until the pain goes down. d. You may drive when you are no longer taking prescription pain medication, you can comfortably wear a seatbelt, and you can safely maneuver your car and apply brakes. e. Dennis Bast may have sexual intercourse when it is comfortable.  9. FOLLOW UP in our office a. Please call [facility] at [phone] to set up an appointment to see your surgeon in the office for a follow-up appointment approximately 1-2 weeks after your surgery. b. Make sure that you call for this appointment the day you arrive home to insure a convenient appointment time. 10. IF YOU HAVE DISABILITY OR FAMILY LEAVE FORMS, BRING THEM TO THE OFFICE FOR PROCESSING.  DO NOT GIVE THEM TO YOUR DOCTOR.   WHEN TO CALL us [phone]: 1. Poor pain control 2. Reactions / problems with new medications (rash/itching, nausea, etc)  3. Fever over 101.5 F (38.5 C) 4. Inability to urinate 5. Nausea and/or vomiting 6. Worsening swelling or bruising 7. Continued bleeding from incision. 8. Increased pain, redness, or drainage from the incision  The clinic staff is available to answer your questions  during regular business hours (8:30am-5pm).  Please dont hesitate to call and ask to speak to one of our nurses for clinical concerns.   A surgeon from [facility] is always on call at the hospitals   If you have a medical emergency, go to the nearest emergency room or call 911.    [facility], [address], [place], [place], [place]  [address] ? MAIN: [phone] ? TOLL FREE: [phone] ? FAX [phone] [email or web address]

## 2015-05-06 NOTE — Consult Note (Addendum)
WOC ostomy consult note Stoma type/location: Colostomy surgery performed on 3/10 to LLQ.  Stomal assessment/size: Stoma red and viable, flush with skin level, 1 3/4 inches oval Peristomal assessment: Intact skin surrounding Treatment options for stomal/peristomal skin: Applied barrier ring to maintain seal Output: Mod amt brown liquid stool Ostomy pouching: 1pc. Education provided: Performed pouch change procedure. Pt plans to discharge home today according to surgical team and will have home health assistance. Wife at bedside for teaching session, pt stood in front of mirror and was able to apply pouch and barrier ring with some assistance and open and close velcro to empty and asks appropriate questions. Reviewed pouching routines and ordering supplies. Educational materials left in the room, 3 sets of extra pouches and barrier rings at bedside. Enrolled patient in [place] program: Yes Julien Girt MSN, RN, [place], [place], [place]

## 2015-05-06 NOTE — Discharge Summary (Signed)
Physician Discharge Summary  [name] [id number] DOB: 08/06/1945 DOA: 04/25/2015  PCP: Merrilee Seashore, MD  Consultation: [place]  Internal medicine  Admit date: 04/25/2015 Discharge date: 05/06/2015  Recommendations for Outpatient Follow-up:   Follow-up Information    Follow up with [facility] E, MD. Schedule an appointment as soon as possible for a visit in 2 weeks.   Specialty:  General Surgery   Why:  or Dr. Dalbert Batman if pt/wife wishes   Contact information:   [place] [address] [phone]      Discharge Diagnoses:  1. Perforated diverticulitis 2. DM II 3. Hypertension 4. Hypothyroidism 5. Alcohol withdrawal 6. Delirium  7. Hypokalemia    Surgical Procedure: exploratory laparotomy, hartmann's procedure---Dr. Grandville Silos  04/26/15  Discharge Condition: stable Disposition: home  Diet recommendation: regular  Filed Weights   04/26/15 0122 04/26/15 0500 04/27/15 0300  Weight: 100.699 kg (222 lb) 102.6 kg (226 lb 3.1 oz) 100 kg (220 lb 7.4 oz)       Hospital Course:  [name] [name] is a 70 year old male with a history of diverticulitis who presented with worsening abdominal pain.  CT showed perforated diverticulitis with considerable intraperitoneal free air. He underwent the procedure listed above.  Medicine was consulted for management of medical problems. He subsequently developed delirium and was started on CIWA protocol requiring a Air cabin crew and haldol.  Surgically remained stable and diet was advanced as ileus resolved.  WOC consulted for ostomy teaching.  He completed 7 days of IV antibiotics post operatively and continued on chemical VTE prophylaxis.  Mobilized with therapies and did quite well recommending HH PT.  Due to ongoing agitation, particularly at night, seroquel was started in addition to home dose of clonazepam which helped with agitation and confusion. On POD#10 he was tolerating a diet, having ostomy function, VSS,  afebrile and alert and oriented. He was therefore felt stable for discharge home.  Therapies and nursing were ordered.  Wound vac was stopped and changed over to wet to dry dressing changes.  Discharge Instructions     Medication List    TAKE these medications        allopurinol 300 MG tablet  Commonly known as:  ZYLOPRIM  Take 300 mg by mouth every evening.     aspirin EC 325 MG tablet  Take 1 tablet (325 mg total) by mouth 2 (two) times daily after a meal. Take x 1 month post op.     B-complex with vitamin C tablet  Take 1 tablet by mouth daily with breakfast.     calcium-vitamin D 500-200 MG-UNIT tablet  Commonly known as:  OSCAL WITH D  Take 1 tablet by mouth daily with breakfast.     cholecalciferol 1000 units tablet  Commonly known as:  VITAMIN D  Take 2,000 Units by mouth daily.     clonazePAM 1 MG tablet  Commonly known as:  KLONOPIN  Take 1 mg by mouth 2 (two) times daily.     DRY EYES OP  Place 1 drop into both eyes 2 (two) times daily as needed (dry eyes).     fluticasone 50 MCG/ACT nasal spray  Commonly known as:  FLONASE  Place 1 spray into both nostrils daily as needed.     furosemide 20 MG tablet  Commonly known as:  LASIX  Take 1 tablet (20 mg total) by mouth as needed.     gabapentin 300 MG capsule  Commonly known as:  NEURONTIN  Take  900 mg by mouth at bedtime.     HYDROcodone-acetaminophen 5-325 MG tablet  Commonly known as:  NORCO/VICODIN  Take 2 tablets by mouth every 4 (four) hours as needed for moderate pain.     hydrocortisone cream 1 %  Apply 1 application topically daily as needed for itching (for feet).     levothyroxine 75 MCG tablet  Commonly known as:  SYNTHROID, LEVOTHROID  Take 75 mcg by mouth every morning.     MAGNESIUM OXIDE PO  Take 500 mg by mouth daily with breakfast.     metFORMIN 500 MG 24 hr tablet  Commonly known as:  GLUCOPHAGE-XR  Take 1,000 mg by mouth at bedtime.     metoprolol 100 MG tablet  Commonly  known as:  LOPRESSOR  Take 100 mg by mouth 2 (two) times daily.     metoprolol succinate 100 MG 24 hr tablet  Commonly known as:  TOPROL-XL  Take 1 tablet (100 mg total) by mouth daily.     nitroGLYCERIN 0.4 MG SL tablet  Commonly known as:  NITROSTAT  Place 1 tablet (0.4 mg total) under the tongue every 5 (five) minutes as needed.     omeprazole 40 MG capsule  Commonly known as:  PRILOSEC  Take 40 mg by mouth daily.     potassium gluconate 595 (99 K) MG Tabs tablet  Take 595 mg by mouth daily.     PROBIOTIC DAILY PO  Take 1 capsule by mouth every morning.     QUEtiapine 25 MG tablet  Commonly known as:  SEROQUEL  Take 1 tablet (25 mg total) by mouth 2 (two) times daily.     valsartan 80 MG tablet  Commonly known as:  DIOVAN  Take 80 mg by mouth daily with breakfast.     venlafaxine XR 75 MG 24 hr capsule  Commonly known as:  EFFEXOR-XR  Take 225 mg by mouth at bedtime.           Follow-up Information    Follow up with [facility] E, MD. Schedule an appointment as soon as possible for a visit in 2 weeks.   Specialty:  General Surgery   Why:  or Dr. Dalbert Batman if pt/wife wishes   Contact information:   [place] [address] [phone]        The results of significant diagnostics from this hospitalization (including imaging, microbiology, ancillary and laboratory) are listed below for reference.    Significant Diagnostic Studies: Ct Abdomen Pelvis W Contrast  04/25/2015  CLINICAL DATA:  Severe diffuse abdominal pain. EXAM: CT ABDOMEN AND PELVIS WITH CONTRAST TECHNIQUE: Multidetector CT imaging of the abdomen and pelvis was performed using the standard protocol following bolus administration of intravenous contrast. CONTRAST:  71mL OMNIPAQUE IOHEXOL 300 MG/ML  SOLN COMPARISON:  12/04/2013 FINDINGS: Postoperative changes in the mediastinum. Atelectasis in the lung bases. Free abdominal air is demonstrated in the abdomen. There is  diverticulosis of the sigmoid colon with pericolonic extraluminal gas and infiltration around the colon at the lower descending region consistent with perforated diverticulitis. Surgical absence of the gallbladder. Mild bile duct dilatation is likely physiologic in the postoperative state. Pancreas is atrophic. No focal liver lesions. Spleen size is normal. No adrenal gland nodules. Diffuse calcification of abdominal aorta without aneurysm. Bilateral renal cysts. Nonobstructing stones in both kidneys. No hydronephrosis. Inferior vena cava and retroperitoneal lymph nodes are unremarkable. Stomach, small bowel, and colon are not abnormally distended. No free fluid in the  abdomen. Pelvis: Right hip arthroplasty causes streak artifact, limiting visualization of some areas. Prostate gland is not enlarged. Bladder wall is not thickened. Diverticulosis of sigmoid colon. Surgical absence of the appendix. Fat in the inguinal canals bilaterally. No free or loculated pelvic fluid collections. Degenerative changes throughout the lumbar spine. No destructive bone lesions. IMPRESSION: Perforated diverticulitis in the low descending colon with free intraperitoneal gas. Nonobstructing stones in both kidneys. Bilateral renal cysts. Surgical absence of the gallbladder with bile duct dilatation, likely physiologic. These results were called by telephone at the time of interpretation on 04/25/2015 at 11:51 pm to Dr. Doylene Canard , who verbally acknowledged these results. Electronically Signed   By: Lucienne Capers M.D.   On: 04/25/2015 23:53   Dg Chest Port 1 View  04/26/2015  CLINICAL DATA:  Oxygen desaturation. EXAM: PORTABLE CHEST 1 VIEW COMPARISON:  04/25/2015 FINDINGS: Postoperative changes in the mediastinum. Shallow inspiration with elevation of the left hemidiaphragm. Atelectasis in the lung bases. Cardiac enlargement without significant vascular congestion. No edema or consolidation. No blunting of costophrenic angles. No  pneumothorax. Similar appearance to previous study. IMPRESSION: Shallow inspiration with atelectasis in the lung bases. Cardiac enlargement. No vascular congestion or edema. Electronically Signed   By: Lucienne Capers M.D.   On: 04/26/2015 04:31   Dg Chest Portable 1 View  04/25/2015  CLINICAL DATA:  Nausea and vomiting.  Abdominal pain. EXAM: PORTABLE CHEST 1 VIEW COMPARISON:  08/24/2014 FINDINGS: Patient is post median sternotomy. Lower lung volumes from prior exam. The heart is enlarged, accentuated by low lung volumes. Probable bibasilar atelectasis. No evidence of pulmonary edema, confluent airspace disease, pleural effusion or pneumothorax. IMPRESSION: Hypoventilatory chest.  Cardiomegaly, accentuated by technique. Electronically Signed   By: Jeb Levering M.D.   On: 04/25/2015 22:50   Dg Abd Portable 2v  04/25/2015  CLINICAL DATA:  Nausea vomiting and abdominal pain EXAM: PORTABLE ABDOMEN - 2 VIEW COMPARISON:  03/07/2014 FINDINGS: Images are limited by patient motion and portable technique. Nonobstructive bowel gas pattern.  No bowel thickening. Gas density under the diaphragm bilaterally could be free air versus bowel gas. If there is concern of acute abdominal pain, CT scanning of the abdomen pelvis may be helpful for further evaluation Lumbar dextroscoliosis.  Cholecystectomy. IMPRESSION: Normal bowel gas pattern. Study is limited by motion and portable technique. Cannot rule out free air.  CT if indicated clinically. Electronically Signed   By: Franchot Gallo M.D.   On: 04/25/2015 22:50    Microbiology: No results found for this or any previous visit (from the past 240 hour(s)).   Labs: Basic Metabolic Panel:  Recent Labs Lab 04/30/15 0336 05/01/15 0445 05/02/15 1004 05/03/15 1140  NA 140 138 137 138  K 3.1* 3.0* 3.2* 3.4*  CL 108 107 106 103  CO2 19* 21* 22 25  GLUCOSE 104* 94 121* 113*  BUN 12 11 7 6   CREATININE 1.04 1.00 0.96 1.05  CALCIUM 9.3 8.6* 8.6* 9.0  MG  --   --    --  1.5*  PHOS 2.5  --   --   --    Liver Function Tests:  Recent Labs Lab 04/30/15 0336  ALBUMIN 3.1*   No results for input(s): LIPASE, AMYLASE in the last 168 hours.  Recent Labs Lab 04/29/15 1224  AMMONIA 56*   CBC:  Recent Labs Lab 04/30/15 0336 05/03/15 1140  WBC 10.1 8.9  HGB 12.5* 12.3*  HCT 38.3* 38.2*  MCV 84.7 85.5  PLT 294 400  Cardiac Enzymes: No results for input(s): CKTOTAL, CKMB, CKMBINDEX, TROPONINI in the last 168 hours. BNP: BNP (last 3 results)  Recent Labs  08/24/14 0700  BNP 60.9    ProBNP (last 3 results) No results for input(s): PROBNP in the last 8760 hours.  CBG:  Recent Labs Lab 05/04/15 2135 05/05/15 0743 05/05/15 1312 05/05/15 1636 05/05/15 2150  GLUCAP 122* 86 92 80 117*    Active Problems:   Hypertension   DM (diabetes mellitus), type 2 (HCC)   AKI (acute kidney injury) ([place])   Diverticulitis   Perforated diverticulum of large intestine   Diverticulitis of colon with perforation   Depression   GERD (gastroesophageal reflux disease)   Hypothyroidism   Diverticulitis of large intestine with perforation without bleeding   Type 2 diabetes mellitus with complication (HCC)   CAD in native artery   Abdominal pain   Agitation   Threatening to others   Time coordinating discharge: <30 mins   Signed:  Dmarion Perfect, ANP-BC

## 2015-05-09 ENCOUNTER — Encounter: Payer: Self-pay | Admitting: General Surgery

## 2015-09-11 ENCOUNTER — Ambulatory Visit: Payer: Self-pay | Admitting: General Surgery

## 2015-10-28 ENCOUNTER — Encounter (HOSPITAL_COMMUNITY)
Admission: RE | Admit: 2015-10-28 | Discharge: 2015-10-28 | Disposition: A | Discharge status: Home / Self Care | Payer: Medicare Other | Source: Ambulatory Visit | Attending: General Surgery | Admitting: General Surgery

## 2015-10-28 ENCOUNTER — Encounter (HOSPITAL_COMMUNITY): Payer: Self-pay

## 2015-10-28 ENCOUNTER — Other Ambulatory Visit (HOSPITAL_COMMUNITY): Payer: Self-pay | Admitting: *Deleted

## 2015-10-28 DIAGNOSIS — E119 Type 2 diabetes mellitus without complications: Secondary | ICD-10-CM | POA: Diagnosis not present

## 2015-10-28 DIAGNOSIS — Z01818 Encounter for other preprocedural examination: Secondary | ICD-10-CM | POA: Insufficient documentation

## 2015-10-28 HISTORY — DX: Hypothyroidism, unspecified: E03.9

## 2015-10-28 HISTORY — DX: Anxiety disorder, unspecified: F41.9

## 2015-10-28 HISTORY — DX: Post-traumatic stress disorder, unspecified: F43.10

## 2015-10-28 HISTORY — DX: Malignant (primary) neoplasm, unspecified: C80.1

## 2015-10-28 LAB — CBC
HCT: 40.5 % (ref 39.0–52.0)
HEMOGLOBIN: 12.9 g/dL — AB (ref 13.0–17.0)
MCH: 28.7 pg (ref 26.0–34.0)
MCHC: 31.9 g/dL (ref 30.0–36.0)
MCV: 90 fL (ref 78.0–100.0)
Platelets: 256 10*3/uL (ref 150–400)
RBC: 4.5 MIL/uL (ref 4.22–5.81)
RDW: 14.3 % (ref 11.5–15.5)
WBC: 7.6 10*3/uL (ref 4.0–10.5)

## 2015-10-28 LAB — BASIC METABOLIC PANEL
ANION GAP: 10 (ref 5–15)
BUN: 6 mg/dL (ref 6–20)
CALCIUM: 9.3 mg/dL (ref 8.9–10.3)
CO2: 29 mmol/L (ref 22–32)
CREATININE: 1.12 mg/dL (ref 0.61–1.24)
Chloride: 98 mmol/L — ABNORMAL LOW (ref 101–111)
GFR calc Af Amer: >60 mL/min (ref >60)
GFR calc non Af Amer: >60 mL/min (ref >60)
GLUCOSE: 132 mg/dL — AB (ref 65–99)
Potassium: 4.1 mmol/L (ref 3.5–5.1)
Sodium: 137 mmol/L (ref 135–145)

## 2015-10-28 LAB — GLUCOSE, CAPILLARY: GLUCOSE-CAPILLARY: 145 mg/dL — AB (ref 65–99)

## 2015-10-28 LAB — SURGICAL PCR SCREEN
MRSA, PCR: NEGATIVE
STAPHYLOCOCCUS AUREUS: NEGATIVE

## 2015-10-28 NOTE — Pre-Procedure Instructions (Signed)
[name] [name]  10/28/2015      [facility] ([facility] [place] [id number] [place] ([place]) [place] [address] Phone: [phone] Fax: [phone]  [place], [place] [address] Phone: [phone] Fax: [phone]    Your procedure is scheduled on 11-05-2015   Tuesday   Report to [facility] Admitting at 5:30 A.M.   Call this number if you have problems the morning of surgery:  [phone]   Remember:   Do not eat food or drink liquids after midnight  Take these medications with a sip of water the morning of surgery. Clonazepam(Klonopin).Flonase nasal spray if needed,pain medication if needed. Levothyroxine(Synthroid),Metoprolol,morphine(MS Contin),Nitroglycerin if needed,Omeprazole(Prilosec).Potassium, Venlafaxine XR (Effexoe-XR)               STOP ASPIRIN,ANTIINFLAMATORIES (IBUPROFEN,ALEVE,MOTRIN,ADVIL,GOODY'S POWDERS),HERBAL SUPPLEMENTS,FISH OIL,AND VITAMINS 5-7 DAYS PRIOR TO SURGERY    Do not wear jewelry,     Do not wear lotions, powders, or perfumes, or deoderant.  Do not shave 48 hours prior to surgery.  Men may shave face and neck.  Do not bring valuables to the hospital.  [facility] is not responsible for any belongings or valuables.  Contacts, dentures or bridgework may not be worn into surgery.  Leave your suitcase in the car.  After surgery it may be brought to your room.  For patients admitted to the hospital, discharge time will be determined by your treatment team.  Patients discharged the day of surgery will not be allowed to drive home.    Special instructions:  See attached Sheet for instructions on CHG showers        How to Manage Your Diabetes Before and After Surgery  Why is it important to control my blood sugar before and after surgery? . Improving blood sugar levels before and after surgery helps healing and  can limit problems. . A way of improving blood sugar control is eating a healthy diet by: o  Eating less sugar and carbohydrates o  Increasing activity/exercise o  Talking with your doctor about reaching your blood sugar goals . High blood sugars (greater than 180 mg/dL) can raise your risk of infections and slow your recovery, so you will need to focus on controlling your diabetes during the weeks before surgery. . Make sure that the doctor who takes care of your diabetes knows about your planned surgery including the date and location.  How do I manage my blood sugar before surgery? . Check your blood sugar at least 4 times a day, starting 2 days before surgery, to make sure that the level is not too high or low. o Check your blood sugar the morning of your surgery when you wake up and every 2 hours until you get to the Short Stay unit. . If your blood sugar is less than 70 mg/dL, you will need to treat for low blood sugar: o Do not take insulin. o Treat a low blood sugar (less than 70 mg/dL) with  cup of clear juice (cranberry or apple), 4 glucose tablets, OR glucose gel. o Recheck blood sugar in 15 minutes after treatment (to make sure it is greater than 70 mg/dL). If your blood sugar is not greater than 70 mg/dL on recheck, call [phone] for further instructions. . Report your blood sugar to the short stay nurse when you get to Short Stay.  . If you  are admitted to the hospital after surgery: o Your blood sugar will be checked by the staff and you will probably be given insulin after surgery (instead of oral diabetes medicines) to make sure you have good blood sugar levels. o The goal for blood sugar control after surgery is 80-180 mg/dL.              WHAT DO I DO ABOUT MY DIABETES MEDICATION?   Marland Kitchen Do not take oral diabetes medicines (pills) the morning of surgery.       Reviewed and Endorsed by [facility] Patient Education Committee, August 2015

## 2015-10-29 LAB — HEMOGLOBIN A1C
HEMOGLOBIN A1C: 5.9 % — AB (ref 4.8–5.6)
Mean Plasma Glucose: 123 mg/dL

## 2015-11-04 MED ORDER — DEXTROSE 5 % IV SOLN
2.0000 g | INTRAVENOUS | Status: AC
  Administered 2015-11-05: 10 g via INTRAVENOUS
  Filled 2015-11-04: qty 2

## 2015-11-04 MED ORDER — METRONIDAZOLE IN NACL 5-0.79 MG/ML-% IV SOLN
500.0000 mg | INTRAVENOUS | Status: AC
  Administered 2015-11-05: 500 mg via INTRAVENOUS
  Filled 2015-11-04: qty 100

## 2015-11-04 NOTE — Anesthesia Preprocedure Evaluation (Addendum)
Anesthesia Evaluation  Patient identified by MRN, date of birth, ID band Patient awake    Reviewed: Allergy & Precautions, NPO status , Patient's Chart, lab work & pertinent test results  History of Anesthesia Complications Negative for: history of anesthetic complications  Airway Mallampati: II  TM Distance: >3 FB Neck ROM: Full    Dental  (+) Caps, Teeth Intact, Dental Advisory Given   Pulmonary COPD, former smoker (quit 1978),    breath sounds clear to auscultation       Cardiovascular hypertension, Pt. on medications and Pt. on home beta blockers (-) angina+ CAD, + Past MI and + CABG   Rhythm:Regular Rate:Normal  '16 ECHO: EF 55-60%, mild AI '14 Dob Echo:  + ECG changes but normal echo => med Rx cont'd   Neuro/Psych Anxiety Depression negative neurological ROS     GI/Hepatic GERD  Controlled,(+)     substance abuse  alcohol use,   Endo/Other  diabetes (glu 91), Oral Hypoglycemic AgentsHypothyroidism   Renal/GU negative Renal ROS     Musculoskeletal  (+) Arthritis , Osteoarthritis,    Abdominal (+) + obese,   Peds  Hematology   Anesthesia Other Findings   Reproductive/Obstetrics                           Anesthesia Physical Anesthesia Plan  ASA: III  Anesthesia Plan: General   Post-op Pain Management:    Induction: Intravenous  Airway Management Planned: Oral ETT  Additional Equipment:   Intra-op Plan:   Post-operative Plan: Extubation in OR  Informed Consent: I have reviewed the patients History and Physical, chart, labs and discussed the procedure including the risks, benefits and alternatives for the proposed anesthesia with the patient or authorized representative who has indicated his/her understanding and acceptance.   Dental advisory given  Plan Discussed with: Anesthesiologist, CRNA and Surgeon  Anesthesia Plan Comments: (Plan routine monitors, GETA)        Anesthesia Quick Evaluation

## 2015-11-04 NOTE — H&P (Signed)
  [name] [name] 09/11/2015 10:46 AM Location: [facility] Patient #: [id number] DOB: 1945-04-23 Married / Language: English / Race: White Male   History of Present Illness Lavone Neri E. Grandville Silos MD; 09/11/2015 11:01 AM) The patient is a 70 year old male presenting for a post-operative visit. Status post emergent sigmoid colectomy with colostomy for diverticulitis in March 2017. He has been doing quite well. He has developed some swelling along his midline wound. His colostomy is functioning well. He has done a good job with physical therapy rehabilitation. He still has some chronic back pain but is getting along with a cane.   Allergies Elbert Ewings, CMA; 09/11/2015 10:46 AM) Ciprofloxacin *CHEMICALS*  Medication History Elbert Ewings, CMA; 09/11/2015 10:46 AM) ClonazePAM (1MG  Tablet, Oral) Active. Fluticasone Propionate (50MCG/ACT Suspension, Nasal) Active. Omeprazole (40MG  Capsule DR, Oral) Active. Venlafaxine HCl ER (75MG  Capsule ER 24HR, Oral) Active. QUEtiapine Fumarate (25MG  Tablet, Oral) Active. Gabapentin (300MG  Capsule, Oral) Active. Hydrocodone-Acetaminophen (10-325MG  Tablet, Oral as needed) Active. Levothyroxine Sodium (75MCG Tablet, Oral) Active. Medications Reconciled  Vitals Elbert Ewings CMA; 09/11/2015 10:46 AM) 09/11/2015 10:46 AM Weight: 229 lb Height: 71in Body Surface Area: 2.23 m Body Mass Index: 31.94 kg/m  Temp.: 97.57F(Temporal)  Pulse: 74 (Regular)  BP: 126/72 (Sitting, Left Arm, Standard)       Physical Exam Lavone Neri E. Grandville Silos MD; 09/11/2015 11:03 AM) General Note: Awake and alert, no distress   Head and Neck Note: Neck is supple without adenopathy   Eye Note: Wears glasses, pupils equal, no scleral icterus   ENMT Note: Oral mucosa is moist   Chest and Lung Exam Note: Clear to auscultation bilaterally, no wheezes   Cardiovascular Note: Regular rate and rhythm, no murmurs, mild peripheral  edema   Abdomen Note: Soft, nontender, left mid abdomen colostomy is functioning well. Small incisional hernia periumbilical region with a fascial defect about 3 cm, spontaneously reduces, nontender   Peripheral Vascular Note: Mild peripheral edema but good perfusion   Neurologic Note: Gait assisted with a cane, moves all extremities, speech is fluent   Musculoskeletal Note: No deformity or tenderness in the extremities     Assessment & Plan Lavone Neri E. Grandville Silos MD; 09/11/2015 11:06 AM) STATUS POST PARTIAL COLECTOMY (Z90.49) COLOSTOMY IN PLACE (Z93.3) Impression: I have offered colostomy takedown and repair of incisional hernia. In light of the location of his incisional hernia, I will proceed with open approach. Possible biologic mesh. Procedure, risks, and benefits were discussed in detail with him and his wife. I discussed the expected postoperative course. I also discussed the bowel prep that we planned to do. He is agreeable. Additionally, due to high output from his colostomy, he is using about 5 ostomy bags per day. We will see if further documentation but Medicare may be able to get that approved. It will certainly be to his benefit to protect his skin, especially in light of her upcoming surgery. INCISIONAL HERNIA, WITHOUT OBSTRUCTION OR GANGRENE (K43.2)  Re-examined today - no changes. Georganna Skeans, MD, MPH, FACS Trauma: [phone] General Surgery: [phone]

## 2015-11-05 ENCOUNTER — Inpatient Hospital Stay (HOSPITAL_COMMUNITY): Payer: Medicare Other | Admitting: Anesthesiology

## 2015-11-05 ENCOUNTER — Encounter (HOSPITAL_COMMUNITY)
Admission: RE | Disposition: A | Discharge status: Home Health | Payer: Self-pay | Source: Ambulatory Visit | Attending: General Surgery

## 2015-11-05 ENCOUNTER — Inpatient Hospital Stay (HOSPITAL_COMMUNITY)
Admission: RE | Admit: 2015-11-05 | Discharge: 2015-11-12 | DRG: 330 | Disposition: A | Discharge status: Home Health | Payer: Medicare Other | Source: Ambulatory Visit | Attending: General Surgery | Admitting: General Surgery

## 2015-11-05 ENCOUNTER — Encounter (HOSPITAL_COMMUNITY): Payer: Self-pay | Admitting: Urology

## 2015-11-05 DIAGNOSIS — K219 Gastro-esophageal reflux disease without esophagitis: Secondary | ICD-10-CM | POA: Diagnosis present

## 2015-11-05 DIAGNOSIS — E669 Obesity, unspecified: Secondary | ICD-10-CM | POA: Diagnosis present

## 2015-11-05 DIAGNOSIS — I2510 Atherosclerotic heart disease of native coronary artery without angina pectoris: Secondary | ICD-10-CM | POA: Diagnosis present

## 2015-11-05 DIAGNOSIS — B368 Other specified superficial mycoses: Secondary | ICD-10-CM | POA: Diagnosis present

## 2015-11-05 DIAGNOSIS — E039 Hypothyroidism, unspecified: Secondary | ICD-10-CM | POA: Diagnosis present

## 2015-11-05 DIAGNOSIS — Z79899 Other long term (current) drug therapy: Secondary | ICD-10-CM | POA: Diagnosis not present

## 2015-11-05 DIAGNOSIS — I252 Old myocardial infarction: Secondary | ICD-10-CM

## 2015-11-05 DIAGNOSIS — Z23 Encounter for immunization: Secondary | ICD-10-CM | POA: Diagnosis not present

## 2015-11-05 DIAGNOSIS — Z433 Encounter for attention to colostomy: Secondary | ICD-10-CM | POA: Diagnosis present

## 2015-11-05 DIAGNOSIS — K432 Incisional hernia without obstruction or gangrene: Secondary | ICD-10-CM | POA: Diagnosis present

## 2015-11-05 DIAGNOSIS — Z87891 Personal history of nicotine dependence: Secondary | ICD-10-CM | POA: Diagnosis not present

## 2015-11-05 DIAGNOSIS — E871 Hypo-osmolality and hyponatremia: Secondary | ICD-10-CM | POA: Diagnosis present

## 2015-11-05 DIAGNOSIS — Z6831 Body mass index (BMI) 31.0-31.9, adult: Secondary | ICD-10-CM | POA: Diagnosis not present

## 2015-11-05 DIAGNOSIS — I739 Peripheral vascular disease, unspecified: Secondary | ICD-10-CM

## 2015-11-05 DIAGNOSIS — Z9889 Other specified postprocedural states: Secondary | ICD-10-CM

## 2015-11-05 DIAGNOSIS — R410 Disorientation, unspecified: Secondary | ICD-10-CM | POA: Diagnosis not present

## 2015-11-05 DIAGNOSIS — D72829 Elevated white blood cell count, unspecified: Secondary | ICD-10-CM | POA: Diagnosis not present

## 2015-11-05 DIAGNOSIS — R451 Restlessness and agitation: Secondary | ICD-10-CM | POA: Diagnosis not present

## 2015-11-05 DIAGNOSIS — Z7984 Long term (current) use of oral hypoglycemic drugs: Secondary | ICD-10-CM

## 2015-11-05 DIAGNOSIS — Z951 Presence of aortocoronary bypass graft: Secondary | ICD-10-CM

## 2015-11-05 DIAGNOSIS — I10 Essential (primary) hypertension: Secondary | ICD-10-CM | POA: Diagnosis present

## 2015-11-05 DIAGNOSIS — E119 Type 2 diabetes mellitus without complications: Secondary | ICD-10-CM | POA: Diagnosis present

## 2015-11-05 DIAGNOSIS — E876 Hypokalemia: Secondary | ICD-10-CM | POA: Diagnosis present

## 2015-11-05 DIAGNOSIS — J449 Chronic obstructive pulmonary disease, unspecified: Secondary | ICD-10-CM | POA: Diagnosis present

## 2015-11-05 HISTORY — PX: INCISIONAL HERNIA REPAIR: SHX193

## 2015-11-05 HISTORY — DX: Adverse effect of unspecified anesthetic, initial encounter: T41.45XA

## 2015-11-05 HISTORY — PX: COLOSTOMY TAKEDOWN: SHX5783

## 2015-11-05 HISTORY — PX: COLOSTOMY REVERSAL: SHX5782

## 2015-11-05 HISTORY — DX: Other complications of anesthesia, initial encounter: T88.59XA

## 2015-11-05 HISTORY — DX: Diverticulitis of intestine, part unspecified, without perforation or abscess without bleeding: K57.92

## 2015-11-05 LAB — GLUCOSE, CAPILLARY
GLUCOSE-CAPILLARY: 144 mg/dL — AB (ref 65–99)
GLUCOSE-CAPILLARY: 145 mg/dL — AB (ref 65–99)
GLUCOSE-CAPILLARY: 167 mg/dL — AB (ref 65–99)
GLUCOSE-CAPILLARY: 91 mg/dL (ref 65–99)
Glucose-Capillary: 105 mg/dL — ABNORMAL HIGH (ref 65–99)

## 2015-11-05 LAB — CBC
HEMATOCRIT: 47.9 % (ref 39.0–52.0)
Hemoglobin: 15.6 g/dL (ref 13.0–17.0)
MCH: 29.1 pg (ref 26.0–34.0)
MCHC: 32.6 g/dL (ref 30.0–36.0)
MCV: 89.2 fL (ref 78.0–100.0)
PLATELETS: 318 10*3/uL (ref 150–400)
RBC: 5.37 MIL/uL (ref 4.22–5.81)
RDW: 14.4 % (ref 11.5–15.5)
WBC: 27 10*3/uL — ABNORMAL HIGH (ref 4.0–10.5)

## 2015-11-05 LAB — CREATININE, SERUM
Creatinine, Ser: 1.11 mg/dL (ref 0.61–1.24)
GFR calc Af Amer: >60 mL/min (ref >60)
GFR calc non Af Amer: >60 mL/min (ref >60)

## 2015-11-05 SURGERY — CLOSURE, COLOSTOMY
Anesthesia: General | Site: Abdomen

## 2015-11-05 MED ORDER — SUGAMMADEX SODIUM 200 MG/2ML IV SOLN
INTRAVENOUS | Status: AC
  Filled 2015-11-05: qty 2

## 2015-11-05 MED ORDER — DIPHENHYDRAMINE HCL 50 MG/ML IJ SOLN: 12.5000 mg | Freq: Four times a day (QID) | INTRAMUSCULAR | Status: DC | PRN

## 2015-11-05 MED ORDER — LEVOTHYROXINE SODIUM 75 MCG PO TABS
75.0000 ug | ORAL_TABLET | ORAL | Status: DC
  Administered 2015-11-06 – 2015-11-12 (×7): 75 ug via ORAL
  Filled 2015-11-05 (×7): qty 1

## 2015-11-05 MED ORDER — ONDANSETRON HCL 4 MG/2ML IJ SOLN
INTRAMUSCULAR | Status: AC
  Filled 2015-11-05: qty 2

## 2015-11-05 MED ORDER — MIDAZOLAM HCL 2 MG/2ML IJ SOLN: 0.5000 mg | Freq: Once | INTRAMUSCULAR | Status: DC | PRN

## 2015-11-05 MED ORDER — 0.9 % SODIUM CHLORIDE (POUR BTL) OPTIME
TOPICAL | Status: DC | PRN
  Administered 2015-11-05 (×3): 1000 mL

## 2015-11-05 MED ORDER — LIDOCAINE 2% (20 MG/ML) 5 ML SYRINGE
INTRAMUSCULAR | Status: DC | PRN
  Administered 2015-11-05: 20 mg via INTRAVENOUS

## 2015-11-05 MED ORDER — HYDROMORPHONE 1 MG/ML IV SOLN
INTRAVENOUS | Status: DC
  Administered 2015-11-05: 3 mg via INTRAVENOUS
  Administered 2015-11-05: 3.3 mg via INTRAVENOUS
  Administered 2015-11-05: 09:00:00 via INTRAVENOUS
  Administered 2015-11-05: 2.4 mg via INTRAVENOUS
  Administered 2015-11-06 (×2): 0.6 mg via INTRAVENOUS
  Administered 2015-11-06: 3 mg via INTRAVENOUS
  Administered 2015-11-06: 1.2 mg via INTRAVENOUS

## 2015-11-05 MED ORDER — BUPIVACAINE-EPINEPHRINE (PF) 0.25% -1:200000 IJ SOLN
INTRAMUSCULAR | Status: AC
  Filled 2015-11-05: qty 30

## 2015-11-05 MED ORDER — ALVIMOPAN 12 MG PO CAPS
12.0000 mg | ORAL_CAPSULE | Freq: Once | ORAL | Status: AC
  Administered 2015-11-05: 12 mg via ORAL

## 2015-11-05 MED ORDER — ALVIMOPAN 12 MG PO CAPS
ORAL_CAPSULE | ORAL | Status: AC
  Administered 2015-11-05: 12 mg via ORAL
  Filled 2015-11-05: qty 1

## 2015-11-05 MED ORDER — ENOXAPARIN SODIUM 40 MG/0.4ML ~~LOC~~ SOLN
40.0000 mg | SUBCUTANEOUS | Status: DC
  Administered 2015-11-06 – 2015-11-12 (×7): 40 mg via SUBCUTANEOUS
  Filled 2015-11-05 (×7): qty 0.4

## 2015-11-05 MED ORDER — HYDROMORPHONE HCL 1 MG/ML IJ SOLN
INTRAMUSCULAR | Status: AC
  Filled 2015-11-05: qty 1

## 2015-11-05 MED ORDER — MORPHINE SULFATE ER 15 MG PO TBCR
15.0000 mg | EXTENDED_RELEASE_TABLET | Freq: Two times a day (BID) | ORAL | Status: DC
  Administered 2015-11-05 – 2015-11-12 (×14): 15 mg via ORAL
  Filled 2015-11-05 (×14): qty 1

## 2015-11-05 MED ORDER — ALVIMOPAN 12 MG PO CAPS
12.0000 mg | ORAL_CAPSULE | Freq: Two times a day (BID) | ORAL | Status: DC
  Administered 2015-11-06 (×2): 12 mg via ORAL
  Filled 2015-11-05 (×2): qty 1

## 2015-11-05 MED ORDER — ONDANSETRON HCL 4 MG/2ML IJ SOLN: 4.0000 mg | Freq: Four times a day (QID) | INTRAMUSCULAR | Status: DC | PRN

## 2015-11-05 MED ORDER — ROCURONIUM BROMIDE 10 MG/ML (PF) SYRINGE
PREFILLED_SYRINGE | INTRAVENOUS | Status: AC
  Filled 2015-11-05: qty 10

## 2015-11-05 MED ORDER — SODIUM CHLORIDE 0.9% FLUSH: 9.0000 mL | INTRAVENOUS | Status: DC | PRN

## 2015-11-05 MED ORDER — LABETALOL HCL 5 MG/ML IV SOLN
5.0000 mg | INTRAVENOUS | Status: DC | PRN
  Administered 2015-11-05 (×2): 5 mg via INTRAVENOUS

## 2015-11-05 MED ORDER — KCL IN DEXTROSE-NACL 20-5-0.45 MEQ/L-%-% IV SOLN
INTRAVENOUS | Status: DC
  Administered 2015-11-05 – 2015-11-06 (×2): via INTRAVENOUS
  Filled 2015-11-05 (×2): qty 1000

## 2015-11-05 MED ORDER — ONDANSETRON HCL 4 MG/2ML IJ SOLN
INTRAMUSCULAR | Status: DC | PRN
  Administered 2015-11-05: 4 mg via INTRAVENOUS

## 2015-11-05 MED ORDER — DIPHENHYDRAMINE HCL 12.5 MG/5ML PO ELIX: 12.5000 mg | ORAL_SOLUTION | Freq: Four times a day (QID) | ORAL | Status: DC | PRN

## 2015-11-05 MED ORDER — ALUM & MAG HYDROXIDE-SIMETH 200-200-20 MG/5ML PO SUSP
30.0000 mL | Freq: Four times a day (QID) | ORAL | Status: DC | PRN
  Administered 2015-11-05: 30 mL via ORAL
  Filled 2015-11-05: qty 30

## 2015-11-05 MED ORDER — BUPIVACAINE-EPINEPHRINE 0.25% -1:200000 IJ SOLN: INTRAMUSCULAR | Status: DC | PRN

## 2015-11-05 MED ORDER — NALOXONE HCL 0.4 MG/ML IJ SOLN: 0.4000 mg | INTRAMUSCULAR | Status: DC | PRN

## 2015-11-05 MED ORDER — SUGAMMADEX SODIUM 200 MG/2ML IV SOLN
INTRAVENOUS | Status: DC | PRN
  Administered 2015-11-05: 200 mg via INTRAVENOUS

## 2015-11-05 MED ORDER — GABAPENTIN 300 MG PO CAPS
900.0000 mg | ORAL_CAPSULE | Freq: Every day | ORAL | Status: DC
  Administered 2015-11-05 – 2015-11-11 (×7): 900 mg via ORAL
  Filled 2015-11-05 (×7): qty 3

## 2015-11-05 MED ORDER — FLUTICASONE PROPIONATE 50 MCG/ACT NA SUSP
1.0000 | Freq: Every day | NASAL | Status: DC | PRN
  Filled 2015-11-05: qty 16

## 2015-11-05 MED ORDER — FENTANYL CITRATE (PF) 100 MCG/2ML IJ SOLN
INTRAMUSCULAR | Status: AC
  Filled 2015-11-05: qty 2

## 2015-11-05 MED ORDER — CHLORHEXIDINE GLUCONATE CLOTH 2 % EX PADS: 6.0000 | MEDICATED_PAD | Freq: Once | CUTANEOUS | Status: DC

## 2015-11-05 MED ORDER — ALLOPURINOL 300 MG PO TABS
300.0000 mg | ORAL_TABLET | Freq: Every evening | ORAL | Status: DC
  Administered 2015-11-05 – 2015-11-11 (×6): 300 mg via ORAL
  Filled 2015-11-05 (×7): qty 1

## 2015-11-05 MED ORDER — LIDOCAINE 2% (20 MG/ML) 5 ML SYRINGE
INTRAMUSCULAR | Status: AC
  Filled 2015-11-05: qty 5

## 2015-11-05 MED ORDER — VENLAFAXINE HCL ER 75 MG PO CP24
225.0000 mg | ORAL_CAPSULE | Freq: Every day | ORAL | Status: DC
  Administered 2015-11-05 – 2015-11-11 (×7): 225 mg via ORAL
  Filled 2015-11-05 (×7): qty 3

## 2015-11-05 MED ORDER — ROCURONIUM BROMIDE 10 MG/ML (PF) SYRINGE
PREFILLED_SYRINGE | INTRAVENOUS | Status: DC | PRN
  Administered 2015-11-05 (×2): 20 mg via INTRAVENOUS
  Administered 2015-11-05: 50 mg via INTRAVENOUS

## 2015-11-05 MED ORDER — PROPOFOL 10 MG/ML IV BOLUS
INTRAVENOUS | Status: AC
  Filled 2015-11-05: qty 20

## 2015-11-05 MED ORDER — MEPERIDINE HCL 25 MG/ML IJ SOLN: 6.2500 mg | INTRAMUSCULAR | Status: DC | PRN

## 2015-11-05 MED ORDER — PROMETHAZINE HCL 25 MG/ML IJ SOLN: 6.2500 mg | INTRAMUSCULAR | Status: DC | PRN

## 2015-11-05 MED ORDER — MIDAZOLAM HCL 2 MG/2ML IJ SOLN
INTRAMUSCULAR | Status: AC
  Filled 2015-11-05: qty 2

## 2015-11-05 MED ORDER — HYDROMORPHONE 1 MG/ML IV SOLN
INTRAVENOUS | Status: AC
  Filled 2015-11-05: qty 25

## 2015-11-05 MED ORDER — INSULIN ASPART 100 UNIT/ML ~~LOC~~ SOLN
0.0000 [IU] | SUBCUTANEOUS | Status: DC
Start: 2015-11-05 — End: 2015-11-12
  Administered 2015-11-05: 3 [IU] via SUBCUTANEOUS
  Administered 2015-11-05 – 2015-11-06 (×2): 2 [IU] via SUBCUTANEOUS
  Administered 2015-11-06: 3 [IU] via SUBCUTANEOUS
  Administered 2015-11-07 – 2015-11-08 (×6): 2 [IU] via SUBCUTANEOUS
  Administered 2015-11-08: 3 [IU] via SUBCUTANEOUS
  Administered 2015-11-09 – 2015-11-11 (×3): 2 [IU] via SUBCUTANEOUS

## 2015-11-05 MED ORDER — HYDROMORPHONE HCL 1 MG/ML IJ SOLN
0.2500 mg | INTRAMUSCULAR | Status: DC | PRN
  Administered 2015-11-05 (×4): 0.5 mg via INTRAVENOUS

## 2015-11-05 MED ORDER — LABETALOL HCL 5 MG/ML IV SOLN
INTRAVENOUS | Status: AC
  Filled 2015-11-05: qty 4

## 2015-11-05 MED ORDER — METOPROLOL TARTRATE 100 MG PO TABS
100.0000 mg | ORAL_TABLET | Freq: Two times a day (BID) | ORAL | Status: DC
  Administered 2015-11-05 – 2015-11-12 (×14): 100 mg via ORAL
  Filled 2015-11-05 (×14): qty 1

## 2015-11-05 MED ORDER — PROPOFOL 10 MG/ML IV BOLUS
INTRAVENOUS | Status: DC | PRN
  Administered 2015-11-05: 100 mg via INTRAVENOUS

## 2015-11-05 MED ORDER — NITROGLYCERIN 0.4 MG SL SUBL
0.4000 mg | SUBLINGUAL_TABLET | SUBLINGUAL | Status: DC | PRN
  Administered 2015-11-08: 0.4 mg via SUBLINGUAL
  Filled 2015-11-05: qty 1

## 2015-11-05 MED ORDER — FENTANYL CITRATE (PF) 100 MCG/2ML IJ SOLN
INTRAMUSCULAR | Status: DC | PRN
  Administered 2015-11-05 (×2): 50 ug via INTRAVENOUS
  Administered 2015-11-05: 100 ug via INTRAVENOUS

## 2015-11-05 MED ORDER — HYDRALAZINE HCL 20 MG/ML IJ SOLN
10.0000 mg | INTRAMUSCULAR | Status: DC | PRN
  Administered 2015-11-08: 10 mg via INTRAVENOUS
  Filled 2015-11-05: qty 1

## 2015-11-05 MED ORDER — LACTATED RINGERS IV SOLN
INTRAVENOUS | Status: DC | PRN
  Administered 2015-11-05: 07:00:00 via INTRAVENOUS

## 2015-11-05 MED ORDER — FAMOTIDINE IN NACL 20-0.9 MG/50ML-% IV SOLN
20.0000 mg | Freq: Two times a day (BID) | INTRAVENOUS | Status: DC
  Administered 2015-11-05 – 2015-11-08 (×7): 20 mg via INTRAVENOUS
  Filled 2015-11-05 (×8): qty 50

## 2015-11-05 MED ORDER — CLONAZEPAM 1 MG PO TABS
1.0000 mg | ORAL_TABLET | Freq: Two times a day (BID) | ORAL | Status: DC
  Administered 2015-11-05 – 2015-11-06 (×2): 1 mg via ORAL
  Filled 2015-11-05 (×2): qty 1

## 2015-11-05 SURGICAL SUPPLY — 71 items
BLADE SURG ROTATE 9660 (MISCELLANEOUS) ×1 IMPLANT
CANISTER SUCTION 2500CC (MISCELLANEOUS) ×2 IMPLANT
CHLORAPREP W/TINT 26ML (MISCELLANEOUS) ×2 IMPLANT
COVER MAYO STAND STRL (DRAPES) ×2 IMPLANT
COVER SURGICAL LIGHT HANDLE (MISCELLANEOUS) ×4 IMPLANT
DRAPE LAPAROSCOPIC ABDOMINAL (DRAPES) ×2 IMPLANT
DRAPE PROXIMA HALF (DRAPES) ×4 IMPLANT
DRAPE UTILITY XL STRL (DRAPES) ×10 IMPLANT
DRAPE WARM FLUID 44X44 (DRAPE) ×2 IMPLANT
DRSG OPSITE POSTOP 4X10 (GAUZE/BANDAGES/DRESSINGS) IMPLANT
DRSG OPSITE POSTOP 4X8 (GAUZE/BANDAGES/DRESSINGS) IMPLANT
ELECT BLADE 6.5 EXT (BLADE) ×2 IMPLANT
ELECT CAUTERY BLADE 6.4 (BLADE) ×4 IMPLANT
ELECT REM PT RETURN 9FT ADLT (ELECTROSURGICAL) ×2
ELECTRODE REM PT RTRN 9FT ADLT (ELECTROSURGICAL) ×1 IMPLANT
GAUZE SPONGE 4X4 12PLY STRL (GAUZE/BANDAGES/DRESSINGS) IMPLANT
GLOVE BIO SURGEON STRL SZ7 (GLOVE) ×1 IMPLANT
GLOVE BIO SURGEON STRL SZ8 (GLOVE) ×4 IMPLANT
GLOVE BIOGEL PI IND STRL 7.0 (GLOVE) IMPLANT
GLOVE BIOGEL PI IND STRL 8 (GLOVE) ×2 IMPLANT
GLOVE BIOGEL PI INDICATOR 7.0 (GLOVE) ×1
GLOVE BIOGEL PI INDICATOR 8 (GLOVE) ×2
GOWN STRL REUS W/ TWL LRG LVL3 (GOWN DISPOSABLE) ×4 IMPLANT
GOWN STRL REUS W/ TWL XL LVL3 (GOWN DISPOSABLE) ×2 IMPLANT
GOWN STRL REUS W/TWL LRG LVL3 (GOWN DISPOSABLE) ×8
GOWN STRL REUS W/TWL XL LVL3 (GOWN DISPOSABLE) ×6
KIT BASIN OR (CUSTOM PROCEDURE TRAY) ×2 IMPLANT
KIT ROOM TURNOVER OR (KITS) ×2 IMPLANT
LEGGING LITHOTOMY PAIR STRL (DRAPES) ×2 IMPLANT
LIGASURE IMPACT 36 18CM CVD LR (INSTRUMENTS) IMPLANT
LIQUID BAND (GAUZE/BANDAGES/DRESSINGS) ×1 IMPLANT
NDL HYPO 25GX1X1/2 BEV (NEEDLE) IMPLANT
NEEDLE 22X1 1/2 (OR ONLY) (NEEDLE) ×2 IMPLANT
NEEDLE HYPO 25GX1X1/2 BEV (NEEDLE) ×2 IMPLANT
NS IRRIG 1000ML POUR BTL (IV SOLUTION) ×4 IMPLANT
PACK GENERAL/GYN (CUSTOM PROCEDURE TRAY) ×2 IMPLANT
PAD ARMBOARD 7.5X6 YLW CONV (MISCELLANEOUS) ×2 IMPLANT
PENCIL BUTTON HOLSTER BLD 10FT (ELECTRODE) ×2 IMPLANT
RELOAD PROXIMATE 75MM BLUE (ENDOMECHANICALS) ×2 IMPLANT
RELOAD STAPLE 75 3.8 BLU REG (ENDOMECHANICALS) IMPLANT
SOL PREP POV-IOD 4OZ 10% (MISCELLANEOUS) ×1 IMPLANT
SPECIMEN JAR MEDIUM (MISCELLANEOUS) IMPLANT
SPONGE LAP 18X18 X RAY DECT (DISPOSABLE) IMPLANT
STAPLER GUN LINEAR PROX 60 (STAPLE) ×1 IMPLANT
STAPLER PROXIMATE 75MM BLUE (STAPLE) ×1 IMPLANT
STAPLER SKIN 35 WIDE (STAPLE) ×1 IMPLANT
STAPLER VISISTAT 35W (STAPLE) ×2 IMPLANT
SUCTION POOLE TIP (SUCTIONS) ×2 IMPLANT
SURGILUBE 2OZ TUBE FLIPTOP (MISCELLANEOUS) IMPLANT
SUT MNCRL AB 4-0 PS2 18 (SUTURE) ×2 IMPLANT
SUT PDS AB 0 CT 36 (SUTURE) ×2 IMPLANT
SUT PDS AB 1 TP1 96 (SUTURE) ×4 IMPLANT
SUT PROLENE 0 CT 2 (SUTURE) IMPLANT
SUT PROLENE 2 0 CT2 30 (SUTURE) IMPLANT
SUT PROLENE 2 0 KS (SUTURE) IMPLANT
SUT SILK 2 0 SH CR/8 (SUTURE) ×2 IMPLANT
SUT SILK 2 0 TIES 10X30 (SUTURE) ×2 IMPLANT
SUT SILK 3 0 SH CR/8 (SUTURE) ×2 IMPLANT
SUT SILK 3 0 TIES 10X30 (SUTURE) ×2 IMPLANT
SUT VIC AB 3-0 SH 27 (SUTURE) ×2
SUT VIC AB 3-0 SH 27X BRD (SUTURE) ×1 IMPLANT
SYR BULB IRRIGATION 50ML (SYRINGE) ×2 IMPLANT
SYR CONTROL 10ML LL (SYRINGE) ×2 IMPLANT
TOWEL OR 17X24 6PK STRL BLUE (TOWEL DISPOSABLE) ×2 IMPLANT
TOWEL OR 17X26 10 PK STRL BLUE (TOWEL DISPOSABLE) ×4 IMPLANT
TRAY FOLEY CATH 16FRSI W/METER (SET/KITS/TRAYS/PACK) ×2 IMPLANT
TRAY PROCTOSCOPIC FIBER OPTIC (SET/KITS/TRAYS/PACK) IMPLANT
TUBE CONNECTING 12X1/4 (SUCTIONS) ×2 IMPLANT
UNDERPAD 30X30 (UNDERPADS AND DIAPERS) ×1 IMPLANT
WATER STERILE IRR 1000ML POUR (IV SOLUTION) IMPLANT
YANKAUER SUCT BULB TIP NO VENT (SUCTIONS) ×4 IMPLANT

## 2015-11-05 NOTE — Anesthesia Postprocedure Evaluation (Signed)
Anesthesia Post Note  Patient: [name] [name]  Procedure(s) Performed: Procedure(s) (LRB): COLOSTOMY TAKEDOWN (N/A) REPAIR INCISIONAL HERNIA (N/A)  Patient location during evaluation: PACU Anesthesia Type: General Level of consciousness: awake and alert, oriented and patient cooperative Pain management: pain level controlled Vital Signs Assessment: post-procedure vital signs reviewed and stable Respiratory status: spontaneous breathing, nonlabored ventilation, respiratory function stable and patient connected to nasal cannula oxygen Cardiovascular status: blood pressure returned to baseline and stable Postop Assessment: no signs of nausea or vomiting Anesthetic complications: no    Last Vitals:  Vitals:   11/05/15 1225 11/05/15 1305  BP: (!) 178/100 (!) 198/100  Pulse: 85 81  Resp: 17 19  Temp:      Last Pain:  Vitals:   11/05/15 1225  TempSrc:   PainSc: Asleep                 Donavin Audino,E. Dreama Kuna

## 2015-11-05 NOTE — Progress Notes (Signed)
Received pt from PACU via bed, groggy, responsive to verbal stimuli. Mid abd incision with honeycomb dressing intact, with bloody drainage noted. Instructed pt on how to use PCA for pain control, verbalized understanding.

## 2015-11-05 NOTE — OR Nursing (Signed)
Dr. Glennon Mac informed of admitting BP: instructions recvd to provide comfort and analgesia and continue to monitior. Baseline BP elevated.Marland Kitchen

## 2015-11-05 NOTE — Transfer of Care (Signed)
Immediate Anesthesia Transfer of Care Note  Patient: [name] [name]  Procedure(s) Performed: Procedure(s): COLOSTOMY TAKEDOWN (N/A) REPAIR INCISIONAL HERNIA (N/A)  Patient Location: PACU  Anesthesia Type:General  Level of Consciousness: awake, alert  and oriented  Airway & Oxygen Therapy: Patient Spontanous Breathing and Patient connected to nasal cannula oxygen  Post-op Assessment: Report given to RN, Post -op Vital signs reviewed and stable and Patient moving all extremities  Post vital signs: Reviewed and stable  Last Vitals:  Vitals:   11/05/15 0626 11/05/15 0907  BP: (!) 156/81 (!) 198/102  Pulse: (!) 54 74  Resp: 18 19  Temp: 36.9 C 36.9 C    Last Pain:  Vitals:   11/05/15 0626  TempSrc: Oral         Complications: No apparent anesthesia complications

## 2015-11-05 NOTE — Op Note (Signed)
11/05/2015  9:01 AM  PATIENT:  [name] [name]  70 y.o. male  PRE-OPERATIVE DIAGNOSIS:  Colostomy in place, incisional hernia  POST-OPERATIVE DIAGNOSIS:  Colostomy in place, incisional hernia  PROCEDURE:  Procedure(s): COLOSTOMY TAKEDOWN REPAIR INCISIONAL HERNIA  SURGEON:  Georganna Skeans, M.D.  ASSISTANTS: Erroll Luna, M.D.   ANESTHESIA:   general  EBL:  Total I/O In: 400 [I.V.:400] Out: - EBL 75  BLOOD ADMINISTERED:none  DRAINS: none   SPECIMEN:  Excision  DISPOSITION OF SPECIMEN:  PATHOLOGY  COUNTS:  YES  DICTATION: .Dragon Dictation Procedure in detail: [name] presents for colostomy takedown repair of his incisional hernia. He tolerated his bowel prep well. He was identified in the preop holding area. Informed consent was obtained. He received intravenous and buttocks. He was brought to the operating room and general endotracheal anesthesia was administered by the anesthesia staff. He was placed in lithotomy position. Abdomen perineal region was prepped and draped in a sterile fashion. Timeout procedure was performed. His old midline scar was excised. The small incisional hernia was identified there and the hernia sac was entered. This allowed easy access into the abdomen. There were some omental adhesions which were taken down from the anterior abdominal wall and the fascia was opened to the length of the incision. There were no significant small bowel adhesions. The distal colon was identified. He was mobilized from some lateral peritoneal attachments and there is plenty of length. The distal colon had no notable diverticulosis. The old marking suture was removed. Next, an elliptical incision was made around the colostomy at the skin site. Subcutis tissues were dissected down using cautery and the colostomy was freed from the abdominal wall. Was brought back into the abdomen. The end of it was trimmed off with GIA-75 stapler. There is plenty of length and the proximal and distal  colon laid next which other for an easy side-to-side anastomosis. A side-to-side anastomosis was performed using GIA-75. The common defect was closed with a TA 60. 3-0 silks were placed along the staple achieving hemostasis. Crotch stitch of 2-0 silk 2 were placed. The anastomosis was widely patent and viable. The abdomen was copiously irrigated with saline. Hemostasis was ensured. Bowel was placed in appropriate position. We did the colon protocol and changed gowns, gloves, and drapes, as well as instruments. The fascia at the colostomy site was then closed with running 0 PDS on the inside as well as the outside. Midline fascia was closed with running #1 looped PDS. We were able to get beyond the edges of the hernia at the central portion getting good fascia on both sides for a good complete closure. Subcutaneous tissues were irrigated and the skin was closed with staples. Sterile dressings were applied. All counts were correct. He tolerated the procedure without apparent complication and was taken recovery in stable condition. PATIENT DISPOSITION:  PACU - hemodynamically stable.   Delay start of Pharmacological VTE agent (>24hrs) due to surgical blood loss or risk of bleeding:  no  Georganna Skeans, MD, MPH, FACS Pager: [phone]  9/19/20179:01 AM

## 2015-11-05 NOTE — Interval H&P Note (Signed)
History and Physical Interval Note:  11/05/2015 6:49 AM  [name] [name]  has presented today for surgery, with the diagnosis of Colostomy in place, incisional hernia  The various methods of treatment have been discussed with the patient and family. After consideration of risks, benefits and other options for treatment, the patient has consented to  Procedure(s): COLOSTOMY TAKEDOWN (N/A) REPAIR INCISIONAL HERNIA (N/A) as a surgical intervention .  The patient's history has been reviewed, patient examined, no change in status, stable for surgery.  I have reviewed the patient's chart and labs.  Questions were answered to the patient's satisfaction.     Forney Kleinpeter E

## 2015-11-05 NOTE — Anesthesia Procedure Notes (Signed)
Procedure Name: Intubation Date/Time: 11/05/2015 7:36 AM Performed by: Kyung Rudd Pre-anesthesia Checklist: Patient identified, Emergency Drugs available, Suction available and Patient being monitored Patient Re-evaluated:Patient Re-evaluated prior to inductionOxygen Delivery Method: Circle system utilized Preoxygenation: Pre-oxygenation with 100% oxygen Intubation Type: IV induction Ventilation: Mask ventilation without difficulty Laryngoscope Size: Mac and 4 Grade View: Grade I Tube type: Oral Tube size: 7.5 mm Number of attempts: 1 Airway Equipment and Method: Stylet Placement Confirmation: ETT inserted through vocal cords under direct vision,  positive ETCO2 and breath sounds checked- equal and bilateral Secured at: 21 cm Tube secured with: Tape Dental Injury: Teeth and Oropharynx as per pre-operative assessment

## 2015-11-06 ENCOUNTER — Encounter (HOSPITAL_COMMUNITY): Payer: Self-pay | Admitting: General Practice

## 2015-11-06 LAB — CBC
HCT: 42.4 % (ref 39.0–52.0)
HEMOGLOBIN: 13.9 g/dL (ref 13.0–17.0)
MCH: 28.8 pg (ref 26.0–34.0)
MCHC: 32.8 g/dL (ref 30.0–36.0)
MCV: 88 fL (ref 78.0–100.0)
PLATELETS: 300 10*3/uL (ref 150–400)
RBC: 4.82 MIL/uL (ref 4.22–5.81)
RDW: 14.4 % (ref 11.5–15.5)
WBC: 19.8 10*3/uL — AB (ref 4.0–10.5)

## 2015-11-06 LAB — BASIC METABOLIC PANEL
ANION GAP: 9 (ref 5–15)
BUN: 13 mg/dL (ref 6–20)
CHLORIDE: 97 mmol/L — AB (ref 101–111)
CO2: 27 mmol/L (ref 22–32)
Calcium: 8.5 mg/dL — ABNORMAL LOW (ref 8.9–10.3)
Creatinine, Ser: 0.96 mg/dL (ref 0.61–1.24)
GLUCOSE: 126 mg/dL — AB (ref 65–99)
POTASSIUM: 3.9 mmol/L (ref 3.5–5.1)
SODIUM: 133 mmol/L — AB (ref 135–145)

## 2015-11-06 LAB — GLUCOSE, CAPILLARY
GLUCOSE-CAPILLARY: 134 mg/dL — AB (ref 65–99)
GLUCOSE-CAPILLARY: 154 mg/dL — AB (ref 65–99)
GLUCOSE-CAPILLARY: 154 mg/dL — AB (ref 65–99)
Glucose-Capillary: 109 mg/dL — ABNORMAL HIGH (ref 65–99)
Glucose-Capillary: 121 mg/dL — ABNORMAL HIGH (ref 65–99)

## 2015-11-06 MED ORDER — INFLUENZA VAC SPLIT QUAD 0.5 ML IM SUSY
0.5000 mL | PREFILLED_SYRINGE | INTRAMUSCULAR | Status: AC
  Administered 2015-11-10: 0.5 mL via INTRAMUSCULAR
  Filled 2015-11-06 (×3): qty 0.5

## 2015-11-06 MED ORDER — LORAZEPAM 2 MG/ML IJ SOLN
INTRAMUSCULAR | Status: AC
  Filled 2015-11-06: qty 1

## 2015-11-06 MED ORDER — LORAZEPAM BOLUS VIA INFUSION: 1.0000 mg | Freq: Four times a day (QID) | INTRAVENOUS | Status: DC | PRN

## 2015-11-06 MED ORDER — LORAZEPAM 2 MG/ML IJ SOLN
0.5000 mg | Freq: Once | INTRAMUSCULAR | Status: AC
  Administered 2015-11-06: 0.5 mg via INTRAMUSCULAR

## 2015-11-06 MED ORDER — LORAZEPAM 2 MG/ML IJ SOLN
INTRAMUSCULAR | Status: AC
  Administered 2015-11-06: 1 mg
  Filled 2015-11-06: qty 1

## 2015-11-06 MED ORDER — LORAZEPAM 2 MG/ML IJ SOLN: 0.5000 mg | Freq: Four times a day (QID) | INTRAMUSCULAR | Status: DC | PRN

## 2015-11-06 MED ORDER — HYDROMORPHONE HCL 1 MG/ML IJ SOLN
1.0000 mg | INTRAMUSCULAR | Status: DC | PRN
  Administered 2015-11-06 – 2015-11-12 (×15): 1 mg via INTRAVENOUS
  Filled 2015-11-06 (×15): qty 1

## 2015-11-06 MED ORDER — POTASSIUM CHLORIDE 2 MEQ/ML IV SOLN
INTRAVENOUS | Status: DC
  Administered 2015-11-06 – 2015-11-07 (×2): via INTRAVENOUS
  Filled 2015-11-06 (×6): qty 1000

## 2015-11-06 MED ORDER — HALOPERIDOL LACTATE 5 MG/ML IJ SOLN
5.0000 mg | Freq: Four times a day (QID) | INTRAMUSCULAR | Status: DC | PRN
  Administered 2015-11-06 – 2015-11-09 (×5): 5 mg via INTRAVENOUS
  Filled 2015-11-06 (×5): qty 1

## 2015-11-06 MED ORDER — LORAZEPAM 2 MG/ML IJ SOLN: 1.0000 mg | INTRAMUSCULAR | Status: DC | PRN

## 2015-11-06 MED ORDER — LORAZEPAM 2 MG/ML IJ SOLN
1.0000 mg | Freq: Four times a day (QID) | INTRAMUSCULAR | Status: DC | PRN
  Administered 2015-11-06 – 2015-11-12 (×12): 1 mg via INTRAVENOUS
  Filled 2015-11-06 (×11): qty 1

## 2015-11-06 MED ORDER — CLONAZEPAM 1 MG PO TABS
2.0000 mg | ORAL_TABLET | Freq: Two times a day (BID) | ORAL | Status: DC
  Administered 2015-11-06 – 2015-11-12 (×12): 2 mg via ORAL
  Filled 2015-11-06 (×12): qty 2

## 2015-11-06 NOTE — Progress Notes (Signed)
Patient ID: [name] [name], male   DOB: January 31, 1946, 70 y.o.   MRN: [id number] I called his wife and updated her on his delirium. She will be coming in. She states this is common at home and it has been very hard on the family recently. Georganna Skeans, MD, MPH, FACS Trauma: [phone] General Surgery: [phone]

## 2015-11-06 NOTE — Evaluation (Signed)
Physical Therapy Evaluation Patient Details Name: [name] [name] MRN: [id number] DOB: [birth year] Today's Date: 11/06/2015   History of Present Illness  Pt is a 70 y.o. male admitted for colostymy takedown and repair of incisional hernia on 11/05/15. PMH: PTSD, MI, HTN, DM, CHF, CAD, anxiety, TKA, Rt THA, cervical fusion.   Clinical Impression  Pt able to sit EOB with +1 mod-max assistance during initial PT session. Unable to safety attempt standing at this time due to pt confusion and increasing agitation. The patient's spouse was present during the session and reports that the pt was minimally active prior to admission (ambulating approx 20 ft). At the patient's current mobility level, she does not feel like she is able to care for him at home. Based upon the patient's current mobility level, recommending SNF for further rehabilitation following acute stay. Recommendation may be modified as the patient progresses with mobility during future PT sessions. The patient's spouse is in agreement at this time.     Follow Up Recommendations SNF;Supervision/Assistance - 24 hour    Equipment Recommendations  None recommended by PT    Recommendations for Other Services       Precautions / Restrictions Precautions Precautions: Fall Precaution Comments: history of back pain Restrictions Weight Bearing Restrictions: No      Mobility  Bed Mobility Overal bed mobility: Needs Assistance Bed Mobility: Supine to Sit;Sit to Supine Rolling: Mod assist   Supine to sit: Max assist Sit to supine: Mod assist   General bed mobility comments: Pt needing repeated verbal and tactile cues for logroll. Pt initially reporting back pain with transition to sitting but sitting comfortably by end of session.   Transfers                 General transfer comment: unsafe to attempt due to confusion and agitation.   Ambulation/Gait                Stairs            Wheelchair Mobility     Modified Rankin (Stroke Patients Only)       Balance Overall balance assessment: Needs assistance Sitting-balance support: Bilateral upper extremity supported Sitting balance-Leahy Scale: Poor Sitting balance - Comments: initially needing mod assist in sitting but able to improve to min guard assist.                                      Pertinent Vitals/Pain Pain Assessment: Faces Faces Pain Scale: Hurts even more Pain Location: back and abdomen Pain Descriptors / Indicators: Grimacing;Guarding;Moaning Pain Intervention(s): Limited activity within patient's tolerance;Monitored during session    Home Living Family/patient expects to be discharged to:: Unsure Living Arrangements: Spouse/significant other Available Help at Discharge: Family;Available 24 hours/day Type of Home: House Home Access: Stairs to enter Entrance Stairs-Rails: Right Entrance Stairs-Number of Steps: 2 Home Layout: One level Home Equipment: Walker - 2 wheels;Cane - single point;Transport chair Additional Comments: spouse reports that she is unsure if she will be able to care for the pt after leaving the hospital.     Prior Function Level of Independence: Independent with assistive device(s)         Comments: Spouse reports that the pt was ambulatory for short distances (approx. 20 ft) with SPC. For longer distances, a transport chair is used. It was reported that the pt stayed in bed for 18-20 hours of the day. Independent  with bathing and dressing.      Hand Dominance        Extremity/Trunk Assessment   Upper Extremity Assessment: Generalized weakness           Lower Extremity Assessment: Generalized weakness         Communication   Communication: No difficulties  Cognition Arousal/Alertness: Awake/alert Behavior During Therapy: Agitated Overall Cognitive Status: Impaired/Different from baseline Area of Impairment: Safety/judgement     Memory: Decreased recall of  precautions         General Comments: Pt oriented to place but not oriented to situation. Poor safety awareness and variable agitation throughout session.     General Comments General comments (skin integrity, edema, etc.): Pt able to sit EOB approximately 8 minutes. Pt requesting return to supine due to fatigue.     Exercises     Assessment/Plan    PT Assessment Patient needs continued PT services  PT Problem List Decreased strength;Decreased range of motion;Decreased activity tolerance;Decreased balance;Decreased mobility;Decreased safety awareness          PT Treatment Interventions DME instruction;Gait training;Stair training;Functional mobility training;Therapeutic activities;Therapeutic exercise;Balance training;Patient/family education    PT Goals (Current goals can be found in the Care Plan section)  Acute Rehab PT Goals Patient Stated Goal: not expressed PT Goal Formulation: With patient/family Time For Goal Achievement: 11/20/15 Potential to Achieve Goals: Fair    Frequency Min 2X/week   Barriers to discharge        Co-evaluation               End of Session   Activity Tolerance: Patient limited by pain;Patient limited by fatigue;Treatment limited secondary to agitation Patient left: in bed;with call bell/phone within reach;with bed alarm set;with family/visitor present;with SCD's reapplied Nurse Communication: Mobility status         Time: 1203-1229 PT Time Calculation (min) (ACUTE ONLY): 26 min   Charges:   PT Evaluation $PT Eval Moderate Complexity: 1 Procedure PT Treatments $Therapeutic Activity: 8-22 mins   PT G Codes:        Cassell Clement, PT, CSCS Pager [phone] Office [phone]  11/06/2015, 2:06 PM

## 2015-11-06 NOTE — Progress Notes (Signed)
1 Day Post-Op  Subjective: Got very agitated starting at 4am. He pulled out his IV. He is confused and refusing vare.  Objective: Vital signs in last 24 hours: Temp:  [97.5 F (36.4 C)-98.9 F (37.2 C)] 98.9 F (37.2 C) (09/20 0643) Pulse Rate:  [67-92] 92 (09/20 0643) Resp:  [12-22] 18 (09/20 0643) BP: (128-210)/(79-110) 148/84 (09/20 0643) SpO2:  [93 %-100 %] 98 % (09/20 0643) Last BM Date: 11/05/15  Intake/Output from previous day: 09/19 0701 - 09/20 0700 In: 1903 [I.V.:1903] Out: 1325 [Urine:1275; Blood:50] Intake/Output this shift: No intake/output data recorded.  General appearance: combative GI: soft, incision with dressing and dry stain, he is not allowing exam  Lab Results:   Recent Labs  11/05/15 1906 11/06/15 0601  WBC 27.0* 19.8*  HGB 15.6 13.9  HCT 47.9 42.4  PLT 318 300   BMET  Recent Labs  11/05/15 1906 11/06/15 0601  NA  --  133*  K  --  3.9  CL  --  97*  CO2  --  27  GLUCOSE  --  126*  BUN  --  13  CREATININE 1.11 0.96  CALCIUM  --  8.5*   PT/INR No results for input(s): LABPROT, INR in the last 72 hours. ABG No results for input(s): PHART, HCO3 in the last 72 hours.  Invalid input(s): PCO2, PO2  Studies/Results: No results found.  Anti-infectives: Anti-infectives    Start     Dose/Rate Route Frequency Ordered Stop   11/05/15 0630  metroNIDAZOLE (FLAGYL) IVPB 500 mg     500 mg 100 mL/hr over 60 Minutes Intravenous To ShortStay Surgical 11/04/15 1058 11/05/15 0814   11/05/15 0600  cefTRIAXone (ROCEPHIN) 2 g in dextrose 5 % 50 mL IVPB     2 g 100 mL/hr over 30 Minutes Intravenous To ShortStay Surgical 11/04/15 1058 11/05/15 0738      Assessment/Plan: s/p Procedure(s): COLOSTOMY TAKEDOWN (N/A) REPAIR INCISIONAL HERNIA (N/A) POD#1 - ice chips, await bowel function, Entereg protocol Delirium - received Ativan IM. Haldol PRN. Reorientation PT eval FEN - mild hyponatremia, change IVF, labs in AM VTE - Lovenox DIspo - await  bowel function  LOS: 1 day    Earlie Schank E 11/06/2015

## 2015-11-06 NOTE — Progress Notes (Signed)
On rounding with night shift,  Patient was found confused and belligerent, only oriented to person.  Pt pulled out IV and was trying to get OOB, cursing and trying to hit staff.  Tried on multiple occasions to reorient pt but pt remained confused and combative. Bed alarm in place.

## 2015-11-06 NOTE — Progress Notes (Signed)
Pt remains confused, agitated and combative at this time, with a brief period earlier in shift of compliance.  Pt took most po meds today with the exception of Allopurinal at 1800 and insulin d/t his refusal. Ativan 0.5mg  IM given this am from charge nurse, Haldol 5mg  IV @ 1435, Ativan 1mg  @1710 , and Dilaudid 1mg  IV 1743.  Pt pulled IV out again @ 1800 even w/ kerlix wrap.  Pt's wife asked earlier if pt could be made a DNR and put on hospice.

## 2015-11-06 NOTE — Progress Notes (Signed)
Patient ID: [name] [name], male   DOB: [birth year], 70 y.o.   MRN: [id number] Calmer. More comfortable. Georganna Skeans, MD, MPH, FACS Trauma: [phone] General Surgery: [phone]

## 2015-11-07 LAB — GLUCOSE, CAPILLARY
GLUCOSE-CAPILLARY: 138 mg/dL — AB (ref 65–99)
GLUCOSE-CAPILLARY: 131 mg/dL — AB (ref 65–99)
GLUCOSE-CAPILLARY: 87 mg/dL (ref 65–99)
GLUCOSE-CAPILLARY: 128 mg/dL — AB (ref 65–99)
Glucose-Capillary: 99 mg/dL (ref 65–99)

## 2015-11-07 LAB — BASIC METABOLIC PANEL
Anion gap: 8 (ref 5–15)
BUN: 10 mg/dL (ref 6–20)
CALCIUM: 8.8 mg/dL — AB (ref 8.9–10.3)
CO2: 27 mmol/L (ref 22–32)
Chloride: 102 mmol/L (ref 101–111)
Creatinine, Ser: 0.86 mg/dL (ref 0.61–1.24)
GFR calc Af Amer: >60 mL/min (ref >60)
GLUCOSE: 103 mg/dL — AB (ref 65–99)
POTASSIUM: 3.3 mmol/L — AB (ref 3.5–5.1)
Sodium: 137 mmol/L (ref 135–145)

## 2015-11-07 LAB — CBC
HCT: 40.7 % (ref 39.0–52.0)
Hemoglobin: 13 g/dL (ref 13.0–17.0)
MCH: 28.1 pg (ref 26.0–34.0)
MCHC: 31.9 g/dL (ref 30.0–36.0)
MCV: 88.1 fL (ref 78.0–100.0)
PLATELETS: 250 10*3/uL (ref 150–400)
RBC: 4.62 MIL/uL (ref 4.22–5.81)
RDW: 14.5 % (ref 11.5–15.5)
WBC: 15 10*3/uL — ABNORMAL HIGH (ref 4.0–10.5)

## 2015-11-07 MED ORDER — POTASSIUM CHLORIDE 10 MEQ/100ML IV SOLN
10.0000 meq | INTRAVENOUS | Status: AC
  Administered 2015-11-07 (×2): 10 meq via INTRAVENOUS
  Filled 2015-11-07: qty 100

## 2015-11-07 NOTE — NC FL2 (Signed)
[place] LEVEL OF CARE SCREENING TOOL     IDENTIFICATION  Patient Name: [name] [name] Birthdate: [birth year] Sex: male Admission Date (Current Location): 11/05/2015  [facility] and [place] Number:  Herbalist and Address:  [facility] [place], [place] [address] [place], [place] [address]      Provider Number: [id number]  Attending Physician Name and Address:  Georganna Skeans, MD  Relative Name and Phone Number:       Current Level of Care: Hospital Recommended Level of Care: [place] Prior Approval Number:    Date Approved/Denied:   PASRR Number:    Discharge Plan: SNF    Current Diagnoses: Patient Active Problem List   Diagnosis Date Noted  . S/P colostomy takedown 11/05/2015  . Abdominal pain   . Agitation   . Threatening to others   . Diverticulitis of large intestine with perforation without bleeding   . Type 2 diabetes mellitus with complication ([place])   . CAD in native artery   . Diverticulitis 04/26/2015  . Perforated diverticulum of large intestine 04/26/2015  . Diverticulitis of colon with perforation 04/26/2015  . Depression 04/26/2015  . GERD (gastroesophageal reflux disease) 04/26/2015  . Hypothyroidism 04/26/2015  . Acute encephalopathy 08/25/2014  . Acute on chronic renal failure ([place]) 08/25/2014  . Blood poisoning ([place])   . Elevated troponin   . CAP (community acquired pneumonia) 08/24/2014  . Sepsis ([place]) 08/24/2014  . Hypokalemia 08/24/2014  . AKI (acute kidney injury) ([place]) 08/24/2014  . Alcohol abuse 08/24/2014  . DM (diabetes mellitus), type 2 ([place]) 02/28/2013  . Abnormal EKG 02/28/2013  . [place] 11/05/2011  . Hyperlipidemia 02/06/2011  . Insomnia 02/06/2011  . Hypertension   . CAD (coronary artery disease)     Orientation RESPIRATION BLADDER Height & Weight     Self  Normal Incontinent Weight: 227 lb 7 oz (103.2 kg) Height:  6' (182.9 cm)  BEHAVIORAL SYMPTOMS/MOOD  NEUROLOGICAL BOWEL NUTRITION STATUS  Wanderer, Verbally abusive   Incontinent  (NORMAL)  AMBULATORY STATUS COMMUNICATION OF NEEDS Skin   Limited Assist Verbally Normal                       Personal Care Assistance Level of Assistance  Bathing, Dressing Bathing Assistance: Maximum assistance   Dressing Assistance: Maximum assistance     Functional Limitations Info             SPECIAL CARE FACTORS FREQUENCY                       Contractures      Additional Factors Info  Code Status (fULL)               Current Medications (11/07/2015):  This is the current hospital active medication list Current Facility-Administered Medications  Medication Dose Route Frequency Provider Last Rate Last Dose  . allopurinol (ZYLOPRIM) tablet 300 mg  300 mg Oral QPM Georganna Skeans, MD   300 mg at 11/05/15 1705  . alum & mag hydroxide-simeth (MAALOX/MYLANTA) 200-200-20 MG/5ML suspension 30 mL  30 mL Oral Q6H PRN Georganna Skeans, MD   30 mL at 11/05/15 2024  . clonazePAM (KLONOPIN) tablet 2 mg  2 mg Oral BID Georganna Skeans, MD   2 mg at 11/07/15 1021  . dextrose 5 % and 0.9% NaCl 1,000 mL with potassium chloride 20 mEq infusion   Intravenous Continuous Georganna Skeans, MD 100 mL/hr at 11/07/15  1023    . enoxaparin (LOVENOX) injection 40 mg  40 mg Subcutaneous Q24H Georganna Skeans, MD   40 mg at 11/07/15 1023  . famotidine (PEPCID) IVPB 20 mg premix  20 mg Intravenous Q12H Georganna Skeans, MD   20 mg at 11/07/15 1046  . fluticasone (FLONASE) 50 MCG/ACT nasal spray 1 spray  1 spray Each Nare Daily PRN Georganna Skeans, MD      . gabapentin (NEURONTIN) capsule 900 mg  900 mg Oral QHS Georganna Skeans, MD   900 mg at 11/06/15 2224  . haloperidol lactate (HALDOL) injection 5 mg  5 mg Intravenous Q6H PRN Georganna Skeans, MD   5 mg at 11/07/15 1134  . hydrALAZINE (APRESOLINE) injection 10 mg  10 mg Intravenous Q2H PRN Georganna Skeans, MD      . HYDROmorphone (DILAUDID) injection 1 mg  1 mg  Intravenous Q3H PRN Georganna Skeans, MD   1 mg at 11/06/15 1743  . Influenza vac split quadrivalent PF (FLUARIX) injection 0.5 mL  0.5 mL Intramuscular Tomorrow-1000 Georganna Skeans, MD      . insulin aspart (novoLOG) injection 0-15 Units  0-15 Units Subcutaneous Q4H Georganna Skeans, MD   2 Units at 11/07/15 1259  . levothyroxine (SYNTHROID, LEVOTHROID) tablet 75 mcg  75 mcg Oral Fran Lowes, MD   75 mcg at 11/07/15 0902  . LORazepam (ATIVAN) injection 1 mg  1 mg Intravenous Q6H PRN Georganna Skeans, MD   1 mg at 11/07/15 1439  . metoprolol (LOPRESSOR) tablet 100 mg  100 mg Oral BID Georganna Skeans, MD   100 mg at 11/07/15 1022  . morphine (MS CONTIN) 12 hr tablet 15 mg  15 mg Oral Q12H Georganna Skeans, MD   15 mg at 11/07/15 1023  . nitroGLYCERIN (NITROSTAT) SL tablet 0.4 mg  0.4 mg Sublingual Q5 min PRN Georganna Skeans, MD      . venlafaxine XR (EFFEXOR-XR) 24 hr capsule 225 mg  225 mg Oral QHS Georganna Skeans, MD   225 mg at 11/06/15 2224     Discharge Medications: Please see discharge summary for a list of discharge medications.  Relevant Imaging Results:  Relevant Lab Results:   Additional Information    Venetia Maxon, Lott R

## 2015-11-07 NOTE — Progress Notes (Signed)
Patient set off bed alarm by trying to get up unattended.  CNA and RN both could not reason with him about staying in the bed.  He stood up and pushed by CNA and RN, RN shut room door and explained that this was not his house and he needed to stay here.  Patient proceeded to push past RN and CNA very threateningly and wandered out into the hallway pushing everybody out of the way.  Both CNA and RN feared the patient would become combative and hit them.  Patient stated this was his house and began profusely cussing and verbally abusive. Patient is a tall, big guy who easily overpowered CNA and Therapist, sports.  Patient has received Ativan IV and Haldol IV throughout the day, only subdues him for a brief time.  RN spoke with Redmond Pulling, MD and got restraint order, wrist restraints and 4 side rails were applied. Patient is telling RN "I'll kill you. I'm gonna kill you dead."  And "I'm gonna burn this place down."  Stated he has a pistol, referring to his house.  Repeatedly calls for Sunday Spillers, his wife, who is not here.  Will continue to monitor him in wrist restraints and administer agitation medication as needed.

## 2015-11-07 NOTE — Progress Notes (Signed)
2 Days Post-Op  Subjective: Resting and calm now, uncertain if any flatus  Objective: Vital signs in last 24 hours: Temp:  [98.1 F (36.7 C)-98.2 F (36.8 C)] 98.1 F (36.7 C) (09/21 0510) Pulse Rate:  [75-89] 81 (09/21 0510) Resp:  [17-26] 17 (09/21 0510) BP: (142-151)/(74-80) 148/74 (09/21 0510) SpO2:  [96 %-97 %] 96 % (09/21 0510) Last BM Date: 11/05/15  Intake/Output from previous day: 09/20 0701 - 09/21 0700 In: 1075 [I.V.:675; IV Piggyback:400] Out: 650 [Urine:650] Intake/Output this shift: No intake/output data recorded.  General appearance: cooperative Resp: clear to auscultation bilaterally Cardio: regular rate and rhythm GI: soft, some BS, dry stain on dressing  Lab Results:   Recent Labs  11/06/15 0601 11/07/15 0401  WBC 19.8* 15.0*  HGB 13.9 13.0  HCT 42.4 40.7  PLT 300 250   BMET  Recent Labs  11/06/15 0601 11/07/15 0401  NA 133* 137  K 3.9 3.3*  CL 97* 102  CO2 27 27  GLUCOSE 126* 103*  BUN 13 10  CREATININE 0.96 0.86  CALCIUM 8.5* 8.8*   PT/INR No results for input(s): LABPROT, INR in the last 72 hours. ABG No results for input(s): PHART, HCO3 in the last 72 hours.  Invalid input(s): PCO2, PO2  Studies/Results: No results found.  Anti-infectives: Anti-infectives    Start     Dose/Rate Route Frequency Ordered Stop   11/05/15 0630  metroNIDAZOLE (FLAGYL) IVPB 500 mg     500 mg 100 mL/hr over 60 Minutes Intravenous To ShortStay Surgical 11/04/15 1058 11/05/15 0814   11/05/15 0600  cefTRIAXone (ROCEPHIN) 2 g in dextrose 5 % 50 mL IVPB     2 g 100 mL/hr over 30 Minutes Intravenous To ShortStay Surgical 11/04/15 1058 11/05/15 0738      Assessment/Plan: s/p Procedure(s): COLOSTOMY TAKEDOWN (N/A) REPAIR INCISIONAL HERNIA (N/A) POD#2 - ice chips, await bowel function, Entereg protocol Delirium - received Ativan IM. Haldol PRN. Reorientation PT eval - rec SNF at D/C FEN - mild hyponatremia resolved, correct hypokalemia VTE -  Lovenox DIspo - await bowel function  LOS: 2 days    Tashena Ibach E 11/07/2015

## 2015-11-08 LAB — GLUCOSE, CAPILLARY
GLUCOSE-CAPILLARY: 119 mg/dL — AB (ref 65–99)
GLUCOSE-CAPILLARY: 121 mg/dL — AB (ref 65–99)
GLUCOSE-CAPILLARY: 103 mg/dL — AB (ref 65–99)
GLUCOSE-CAPILLARY: 168 mg/dL — AB (ref 65–99)
Glucose-Capillary: 121 mg/dL — ABNORMAL HIGH (ref 65–99)
Glucose-Capillary: 96 mg/dL (ref 65–99)

## 2015-11-08 LAB — CBC
HEMATOCRIT: 35.1 % — AB (ref 39.0–52.0)
HEMOGLOBIN: 11.4 g/dL — AB (ref 13.0–17.0)
MCH: 28.5 pg (ref 26.0–34.0)
MCHC: 32.5 g/dL (ref 30.0–36.0)
MCV: 87.8 fL (ref 78.0–100.0)
Platelets: 256 10*3/uL (ref 150–400)
RBC: 4 MIL/uL — ABNORMAL LOW (ref 4.22–5.81)
RDW: 14.4 % (ref 11.5–15.5)
WBC: 12.1 10*3/uL — ABNORMAL HIGH (ref 4.0–10.5)

## 2015-11-08 LAB — BASIC METABOLIC PANEL
ANION GAP: 7 (ref 5–15)
BUN: 7 mg/dL (ref 6–20)
CHLORIDE: 104 mmol/L (ref 101–111)
CO2: 24 mmol/L (ref 22–32)
CREATININE: 0.82 mg/dL (ref 0.61–1.24)
Calcium: 8.4 mg/dL — ABNORMAL LOW (ref 8.9–10.3)
GFR calc non Af Amer: >60 mL/min (ref >60)
GLUCOSE: 122 mg/dL — AB (ref 65–99)
Potassium: 3.1 mmol/L — ABNORMAL LOW (ref 3.5–5.1)
Sodium: 135 mmol/L (ref 135–145)

## 2015-11-08 MED ORDER — QUETIAPINE FUMARATE 50 MG PO TABS
50.0000 mg | ORAL_TABLET | Freq: Two times a day (BID) | ORAL | Status: DC
  Administered 2015-11-08 – 2015-11-12 (×9): 50 mg via ORAL
  Filled 2015-11-08 (×9): qty 1

## 2015-11-08 MED ORDER — POTASSIUM CHLORIDE CRYS ER 20 MEQ PO TBCR
40.0000 meq | EXTENDED_RELEASE_TABLET | Freq: Two times a day (BID) | ORAL | Status: AC
  Administered 2015-11-08 (×2): 40 meq via ORAL
  Filled 2015-11-08 (×2): qty 2

## 2015-11-08 MED ORDER — METOPROLOL TARTRATE 5 MG/5ML IV SOLN
5.0000 mg | INTRAVENOUS | Status: AC
  Administered 2015-11-08: 5 mg via INTRAVENOUS

## 2015-11-08 MED ORDER — FAMOTIDINE 20 MG PO TABS
20.0000 mg | ORAL_TABLET | Freq: Two times a day (BID) | ORAL | Status: DC
  Administered 2015-11-08 – 2015-11-12 (×8): 20 mg via ORAL
  Filled 2015-11-08 (×8): qty 1

## 2015-11-08 MED ORDER — METOPROLOL TARTRATE 5 MG/5ML IV SOLN
INTRAVENOUS | Status: AC
  Administered 2015-11-08: 5 mg
  Filled 2015-11-08: qty 5

## 2015-11-08 MED ORDER — KCL IN DEXTROSE-NACL 20-5-0.9 MEQ/L-%-% IV SOLN
INTRAVENOUS | Status: DC
  Administered 2015-11-08: 06:00:00 via INTRAVENOUS
  Filled 2015-11-08 (×2): qty 1000

## 2015-11-08 MED ORDER — METOPROLOL TARTRATE 5 MG/5ML IV SOLN: 5.0000 mg | INTRAVENOUS | Status: DC | PRN

## 2015-11-08 NOTE — Progress Notes (Signed)
Pt reported that he is having some chest pain at rest and wants a nitro pill. bp checked and noted to be 200/105. Nitro administered as ordered. Prn hydralazine also administered. MD came to room and ordered 5mg  iv lopressor which was also administered. EKG done as ordered. 2l oxygen via nasal canula initiated. Pt reports relief from chest pain after the above measures ,will continue to monitor

## 2015-11-08 NOTE — Progress Notes (Signed)
1mg  prn iv ativan administered for restlessness and agitation.  Wrist restraints put back on for safety

## 2015-11-08 NOTE — Care Management Important Message (Signed)
Important Message  Patient Details  Name: [name] [name] MRN: [id number] Date of Birth: Sep 15, 1945   Medicare Important Message Given:  Yes    Orbie Pyo 11/08/2015, 1:49 PM

## 2015-11-08 NOTE — Progress Notes (Signed)
Pt was calm most of the afternoon and then became agitated and verbally abusive towards family members. Prn ativan and bilateral wrist restraints reinitiated for safety

## 2015-11-08 NOTE — Progress Notes (Signed)
Patient ID: [name] [name], male   DOB: [birth year], 70 y.o.   MRN: [id number] I spoke with his wife at the bedside. EKG OK. Georganna Skeans, MD, MPH, FACS Trauma: [phone] General Surgery: [phone]

## 2015-11-08 NOTE — Progress Notes (Signed)
PASRR: [id number]

## 2015-11-08 NOTE — Progress Notes (Signed)
3 Days Post-Op  Subjective: Agitated yesterday evening again, threatening staff. Calm this AM but C/O chest pain. BP elevated.  Objective: Vital signs in last 24 hours: Temp:  [97.4 F (36.3 C)-98.3 F (36.8 C)] 97.7 F (36.5 C) (09/22 0721) Pulse Rate:  [80-103] 92 (09/22 0721) Resp:  [15-20] 16 (09/22 0644) BP: (137-195)/(79-101) 192/101 (09/22 0721) SpO2:  [97 %-99 %] 98 % (09/22 0721) Last BM Date: 11/07/15  Intake/Output from previous day: 09/21 0701 - 09/22 0700 In: 2946.7 [P.O.:600; I.V.:1996.7; IV Piggyback:350] Out: 650 [Urine:650] Intake/Output this shift: No intake/output data recorded.  General appearance: cooperative Resp: clear to auscultation bilaterally Cardio: regular rate and rhythm GI: soft, incisions OK, some evolving ecchymosis, no cellulitis, active BS  Lab Results:   Recent Labs  11/07/15 0401 11/08/15 0420  WBC 15.0* 12.1*  HGB 13.0 11.4*  HCT 40.7 35.1*  PLT 250 256   BMET  Recent Labs  11/07/15 0401 11/08/15 0420  NA 137 135  K 3.3* 3.1*  CL 102 104  CO2 27 24  GLUCOSE 103* 122*  BUN 10 7  CREATININE 0.86 0.82  CALCIUM 8.8* 8.4*   PT/INR No results for input(s): LABPROT, INR in the last 72 hours. ABG No results for input(s): PHART, HCO3 in the last 72 hours.  Invalid input(s): PCO2, PO2  Studies/Results: No results found.  Anti-infectives: Anti-infectives    Start     Dose/Rate Route Frequency Ordered Stop   11/05/15 0630  metroNIDAZOLE (FLAGYL) IVPB 500 mg     500 mg 100 mL/hr over 60 Minutes Intravenous To ShortStay Surgical 11/04/15 1058 11/05/15 0814   11/05/15 0600  cefTRIAXone (ROCEPHIN) 2 g in dextrose 5 % 50 mL IVPB     2 g 100 mL/hr over 30 Minutes Intravenous To ShortStay Surgical 11/04/15 1058 11/05/15 0738      Assessment/Plan: s/p Procedure(s): COLOSTOMY TAKEDOWN (N/A) REPAIR INCISIONAL HERNIA (N/A) POD#3 - full liquids Delirium - add Seroquel PT eval - rec SNF at D/C FEN - correct hypokalemia,  KVO IVF CV - HTN and CP, 12 lead now, takes nitro PRN at home and he got that, Lopressor now and PRN VTE - Lovenox DIspo - eventual SNF  LOS: 3 days    Agnieszka Newhouse E 11/08/2015

## 2015-11-08 NOTE — Progress Notes (Signed)
Physical Therapy Treatment Patient Details Name: [name] [name] MRN: [id number] DOB: Feb 29, 1945 Today's Date: 11/08/2015    History of Present Illness Pt is a 70 y.o. male admitted for colostymy takedown and repair of incisional hernia on 11/05/15. PMH: PTSD, MI, HTN, DM, CHF, CAD, anxiety, TKA, Rt THA, cervical fusion.     PT Comments    Pt with decreased agitation and improved cooperation during PT session. Pt remains confused and demonstrating poor safety awareness throughout session. Based upon the patient's current mobility, PT continuing to recommend SNF following acute stay.   Follow Up Recommendations  SNF;Supervision/Assistance - 24 hour     Equipment Recommendations  None recommended by PT    Recommendations for Other Services       Precautions / Restrictions Precautions Precautions: Fall Precaution Comments: history of back pain Restrictions Weight Bearing Restrictions: No    Mobility  Bed Mobility Overal bed mobility: Needs Assistance Bed Mobility: Supine to Sit Rolling: Min assist   Supine to sit: Mod assist     General bed mobility comments: tactile and verbal cues needed for logroll.   Transfers Overall transfer level: Needs assistance Equipment used: None;2 person hand held assist Transfers: Sit to/from [facility] Sit to Stand: +2 physical assistance;Min assist Stand pivot transfers: +2 physical assistance;Min assist       General transfer comment: +2 min assist with HHA for transfers. Having to stand additional time for hygine purposes.   Ambulation/Gait                 Stairs            Wheelchair Mobility    Modified Rankin (Stroke Patients Only)       Balance Overall balance assessment: Needs assistance Sitting-balance support: No upper extremity supported Sitting balance-Leahy Scale: Fair     Standing balance support: Bilateral upper extremity supported Standing balance-Leahy Scale: Poor                       Cognition Arousal/Alertness: Awake/alert Behavior During Therapy: Flat affect Overall Cognitive Status: Impaired/Different from baseline Area of Impairment: Safety/judgement;Orientation;Awareness Orientation Level: Situation       Safety/Judgement: Decreased awareness of safety          Exercises      General Comments        Pertinent Vitals/Pain Pain Assessment: Faces Faces Pain Scale: No hurt (pt not expressing pain during session)    Home Living                      Prior Function            PT Goals (current goals can now be found in the care plan section) Acute Rehab PT Goals Patient Stated Goal: not expressed PT Goal Formulation: With patient/family Time For Goal Achievement: 11/20/15 Potential to Achieve Goals: Fair Progress towards PT goals: Progressing toward goals    Frequency    Min 2X/week      PT Plan Current plan remains appropriate    Co-evaluation             End of Session Equipment Utilized During Treatment: Gait belt Activity Tolerance: Patient tolerated treatment well Patient left: in chair;with call bell/phone within reach;with chair alarm set;with family/visitor present     Time: 1108-1130 PT Time Calculation (min) (ACUTE ONLY): 22 min  Charges:  $Therapeutic Activity: 8-22 mins  G Codes:      Cassell Clement, PT, CSCS Pager [phone] Office [phone]  11/08/2015, 4:28 PM

## 2015-11-09 ENCOUNTER — Inpatient Hospital Stay (HOSPITAL_COMMUNITY): Payer: Medicare Other

## 2015-11-09 LAB — CBC
HCT: 39.1 % (ref 39.0–52.0)
Hemoglobin: 12.7 g/dL — ABNORMAL LOW (ref 13.0–17.0)
MCH: 28.9 pg (ref 26.0–34.0)
MCHC: 32.5 g/dL (ref 30.0–36.0)
MCV: 89.1 fL (ref 78.0–100.0)
Platelets: 299 10*3/uL (ref 150–400)
RBC: 4.39 MIL/uL (ref 4.22–5.81)
RDW: 14.5 % (ref 11.5–15.5)
WBC: 16.7 10*3/uL — ABNORMAL HIGH (ref 4.0–10.5)

## 2015-11-09 LAB — URINALYSIS, ROUTINE W REFLEX MICROSCOPIC
Bilirubin Urine: NEGATIVE
Glucose, UA: NEGATIVE mg/dL
Hgb urine dipstick: NEGATIVE
KETONES UR: 15 mg/dL — AB
LEUKOCYTES UA: NEGATIVE
NITRITE: NEGATIVE
PH: 6.5 (ref 5.0–8.0)
Protein, ur: 100 mg/dL — AB
SPECIFIC GRAVITY, URINE: 1.015 (ref 1.005–1.030)

## 2015-11-09 LAB — GLUCOSE, CAPILLARY
GLUCOSE-CAPILLARY: 107 mg/dL — AB (ref 65–99)
GLUCOSE-CAPILLARY: 136 mg/dL — AB (ref 65–99)
Glucose-Capillary: 105 mg/dL — ABNORMAL HIGH (ref 65–99)
Glucose-Capillary: 107 mg/dL — ABNORMAL HIGH (ref 65–99)
Glucose-Capillary: 134 mg/dL — ABNORMAL HIGH (ref 65–99)
Glucose-Capillary: 93 mg/dL (ref 65–99)
Glucose-Capillary: 94 mg/dL (ref 65–99)

## 2015-11-09 LAB — BASIC METABOLIC PANEL
Anion gap: 9 (ref 5–15)
BUN: 10 mg/dL (ref 6–20)
CALCIUM: 9.1 mg/dL (ref 8.9–10.3)
CO2: 25 mmol/L (ref 22–32)
CREATININE: 0.88 mg/dL (ref 0.61–1.24)
Chloride: 103 mmol/L (ref 101–111)
GFR calc Af Amer: >60 mL/min (ref >60)
GLUCOSE: 101 mg/dL — AB (ref 65–99)
Potassium: 3.6 mmol/L (ref 3.5–5.1)
Sodium: 137 mmol/L (ref 135–145)

## 2015-11-09 LAB — URINE MICROSCOPIC-ADD ON
BACTERIA UA: NONE SEEN
SQUAMOUS EPITHELIAL / LPF: NONE SEEN
WBC, UA: NONE SEEN WBC/hpf (ref 0–5)

## 2015-11-09 NOTE — Progress Notes (Signed)
4 Days Post-Op  Subjective: Baseline confusion and some agitation but very pleasant and reasonable during my visit. He denies abdominal pain. Has had a bowel movement. Denies nausea. No specific complaints.  Objective: Vital signs in last 24 hours: Temp:  [97.6 F (36.4 C)-98 F (36.7 C)] 97.8 F (36.6 C) (09/23 0442) Pulse Rate:  [75-101] 88 (09/23 0442) Resp:  [17-20] 17 (09/23 0442) BP: (149-178)/(79-88) 178/87 (09/23 0442) SpO2:  [98 %-100 %] 100 % (09/23 0442) Last BM Date: 11/07/15  Intake/Output from previous day: 09/22 0701 - 09/23 0700 In: 480 [P.O.:480] Out: 1850 [Urine:1850] Intake/Output this shift: No intake/output data recorded.  General appearance: alert, no distress and Confusion which seems baseline Resp: clear to auscultation bilaterally GI: normal findings: soft, non-tender Extremities: no edema, redness or tenderness in the calves or thighs Incision/Wound: Clean and dry  Lab Results:   Recent Labs  11/08/15 0420 11/09/15 0558  WBC 12.1* 16.7*  HGB 11.4* 12.7*  HCT 35.1* 39.1  PLT 256 299   BMET  Recent Labs  11/08/15 0420 11/09/15 0558  NA 135 137  K 3.1* 3.6  CL 104 103  CO2 24 25  GLUCOSE 122* 101*  BUN 7 10  CREATININE 0.82 0.88  CALCIUM 8.4* 9.1     Studies/Results: No results found.  Anti-infectives: Anti-infectives    Start     Dose/Rate Route Frequency Ordered Stop   11/05/15 0630  metroNIDAZOLE (FLAGYL) IVPB 500 mg     500 mg 100 mL/hr over 60 Minutes Intravenous To ShortStay Surgical 11/04/15 1058 11/05/15 0814   11/05/15 0600  cefTRIAXone (ROCEPHIN) 2 g in dextrose 5 % 50 mL IVPB     2 g 100 mL/hr over 30 Minutes Intravenous To ShortStay Surgical 11/04/15 1058 11/05/15 0738      Assessment/Plan: s/p Procedure(s): COLOSTOMY TAKEDOWN REPAIR INCISIONAL HERNIA Seems to be doing well by history and exam. Leukocytosis of some concern. Check chest x-ray and urinalysis. Repeat CBC tomorrow.   LOS: 4 days     Danely Bayliss T 9/23/2017Patient ID: [name] [name], male   DOB: 1945-10-19, 69 y.o.   MRN: [id number]

## 2015-11-10 LAB — CBC WITH DIFFERENTIAL/PLATELET
BASOS ABS: 0 10*3/uL (ref 0.0–0.1)
Basophils Relative: 0 %
EOS ABS: 0.6 10*3/uL (ref 0.0–0.7)
EOS PCT: 3 %
HCT: 37 % — ABNORMAL LOW (ref 39.0–52.0)
Hemoglobin: 11.9 g/dL — ABNORMAL LOW (ref 13.0–17.0)
LYMPHS PCT: 12 %
Lymphs Abs: 2.3 10*3/uL (ref 0.7–4.0)
MCH: 28.5 pg (ref 26.0–34.0)
MCHC: 32.2 g/dL (ref 30.0–36.0)
MCV: 88.7 fL (ref 78.0–100.0)
Monocytes Absolute: 1.3 10*3/uL — ABNORMAL HIGH (ref 0.1–1.0)
Monocytes Relative: 7 %
Neutro Abs: 15.2 10*3/uL — ABNORMAL HIGH (ref 1.7–7.7)
Neutrophils Relative %: 78 %
PLATELETS: 305 10*3/uL (ref 150–400)
RBC: 4.17 MIL/uL — AB (ref 4.22–5.81)
RDW: 14.7 % (ref 11.5–15.5)
WBC: 19.6 10*3/uL — AB (ref 4.0–10.5)

## 2015-11-10 LAB — GLUCOSE, CAPILLARY
GLUCOSE-CAPILLARY: 101 mg/dL — AB (ref 65–99)
GLUCOSE-CAPILLARY: 116 mg/dL — AB (ref 65–99)
GLUCOSE-CAPILLARY: 135 mg/dL — AB (ref 65–99)
Glucose-Capillary: 99 mg/dL (ref 65–99)
Glucose-Capillary: 97 mg/dL (ref 65–99)
Glucose-Capillary: 119 mg/dL — ABNORMAL HIGH (ref 65–99)

## 2015-11-10 MED ORDER — ACETAMINOPHEN 325 MG PO TABS
650.0000 mg | ORAL_TABLET | Freq: Four times a day (QID) | ORAL | Status: DC | PRN
  Administered 2015-11-10: 650 mg via ORAL
  Filled 2015-11-10: qty 2

## 2015-11-10 MED ORDER — NYSTATIN 100000 UNIT/GM EX POWD
Freq: Two times a day (BID) | CUTANEOUS | Status: DC
  Administered 2015-11-10 – 2015-11-12 (×5): via TOPICAL
  Filled 2015-11-10: qty 15

## 2015-11-10 NOTE — Progress Notes (Signed)
5 Days Post-Op  Subjective: Pain at anus with bowel movements. Denies abdominal pain. Tolerating full liquids well.  Objective: Vital signs in last 24 hours: Temp:  [97.6 F (36.4 C)-98.5 F (36.9 C)] 98.5 F (36.9 C) (09/24 0635) Pulse Rate:  [78-84] 80 (09/24 0635) Resp:  [16] 16 (09/24 0635) BP: (131-158)/(78-91) 131/78 (09/24 0635) SpO2:  [96 %-98 %] 96 % (09/24 0635) Last BM Date: 11/09/15  Intake/Output from previous day: 09/23 0701 - 09/24 0700 In: 365.8 [P.O.:290; I.V.:75.8] Out: 1650 [Urine:1650] Intake/Output this shift: Total I/O In: 360 [P.O.:360] Out: -   General appearance: alert, cooperative and Much more calm and appropriate yesterday GI: normal findings: soft, non-tender and Nondistended Skin:  Erythematous rash over both buttocks. Linear area of skin breakdown on the left perianal area Incision/Wound: No erythema or drainage  Lab Results:   Recent Labs  11/08/15 0420 11/09/15 0558  WBC 12.1* 16.7*  HGB 11.4* 12.7*  HCT 35.1* 39.1  PLT 256 299   BMET  Recent Labs  11/08/15 0420 11/09/15 0558  NA 135 137  K 3.1* 3.6  CL 104 103  CO2 24 25  GLUCOSE 122* 101*  BUN 7 10  CREATININE 0.82 0.88  CALCIUM 8.4* 9.1   Urinalysis    Component Value Date/Time   COLORURINE YELLOW 11/09/2015 1720   APPEARANCEUR CLEAR 11/09/2015 1720   LABSPEC 1.015 11/09/2015 1720   PHURINE 6.5 11/09/2015 1720   GLUCOSEU NEGATIVE 11/09/2015 1720   HGBUR NEGATIVE 11/09/2015 1720   BILIRUBINUR NEGATIVE 11/09/2015 1720   KETONESUR 15 (A) 11/09/2015 1720   PROTEINUR 100 (A) 11/09/2015 1720   UROBILINOGEN 0.2 08/24/2014 0847   NITRITE NEGATIVE 11/09/2015 1720   LEUKOCYTESUR NEGATIVE 11/09/2015 1720      Studies/Results: Dg Chest Port 1 View  Result Date: 11/09/2015 CLINICAL DATA:  Leukocytosis EXAM: PORTABLE CHEST - 1 VIEW COMPARISON:  04/26/2015 FINDINGS: Elevation of left diaphragmatic leaflet. Atelectasis at the left lung base, slightly improved. No new  infiltrate or overt edema. Heart size upper limits normal.  Atheromatous aorta. No definite effusion.  No pneumothorax. Previous median sternotomy. IMPRESSION: Slight improvement in left lower lung atelectasis. No acute findings. Electronically Signed   By: Lucrezia Europe M.D.   On: 11/09/2015 14:52    Anti-infectives: Anti-infectives    Start     Dose/Rate Route Frequency Ordered Stop   11/05/15 0630  metroNIDAZOLE (FLAGYL) IVPB 500 mg     500 mg 100 mL/hr over 60 Minutes Intravenous To ShortStay Surgical 11/04/15 1058 11/05/15 0814   11/05/15 0600  cefTRIAXone (ROCEPHIN) 2 g in dextrose 5 % 50 mL IVPB     2 g 100 mL/hr over 30 Minutes Intravenous To ShortStay Surgical 11/04/15 1058 11/05/15 0738      Assessment/Plan: s/p Procedure(s): COLOSTOMY TAKEDOWN REPAIR INCISIONAL HERNIA Overall seems to be doing well. Leukocytosis, repeat CBC pending this morning. Abdomen benign. He has what appears to be a monilial rash over his buttock as well as some perianal skin breakdown. Nystatin ordered and wound care discussed with nursing. Advance to regular diet.   LOS: 5 days    Mora Pedraza T 9/24/2017Patient ID: [name] [name], male   DOB: Jan 28, 1946, 70 y.o.   MRN: [id number]

## 2015-11-10 NOTE — Progress Notes (Signed)
Noted reddened areas in bilateral groins, buttocks and a broken area on the left buttock, inner cheek.  Showed the area to Dr. Excell Seltzer and he examined it.  Nystatin powder ordered for yeast and foam for areas of pressure.

## 2015-11-11 LAB — CBC
HEMATOCRIT: 39.1 % (ref 39.0–52.0)
HEMOGLOBIN: 12.8 g/dL — AB (ref 13.0–17.0)
MCH: 28.9 pg (ref 26.0–34.0)
MCHC: 32.7 g/dL (ref 30.0–36.0)
MCV: 88.3 fL (ref 78.0–100.0)
Platelets: 343 10*3/uL (ref 150–400)
RBC: 4.43 MIL/uL (ref 4.22–5.81)
RDW: 14.9 % (ref 11.5–15.5)
WBC: 19.1 10*3/uL — AB (ref 4.0–10.5)

## 2015-11-11 LAB — GLUCOSE, CAPILLARY
GLUCOSE-CAPILLARY: 100 mg/dL — AB (ref 65–99)
GLUCOSE-CAPILLARY: 105 mg/dL — AB (ref 65–99)
GLUCOSE-CAPILLARY: 137 mg/dL — AB (ref 65–99)
GLUCOSE-CAPILLARY: 74 mg/dL (ref 65–99)
Glucose-Capillary: 102 mg/dL — ABNORMAL HIGH (ref 65–99)
Glucose-Capillary: 125 mg/dL — ABNORMAL HIGH (ref 65–99)

## 2015-11-11 NOTE — Progress Notes (Signed)
SW spoke with patient at bedside. Patient was alert and oriented. There was no family present. However, SW spoke with wife on the phone while in the room with patient. Patient states that he would prefer to go home with home health. Patient informed SW that he had [place] in the past.   Wife states that she would be agreeable for patient to return home. Wife states that pt has had multiple surgeries in the past and that the pt has done well when he came home. Wife states that she would be able to appropriately care for patient at home. Husband states wife is an ex-nurse. Wife states that she would like pt to get a PT and a nurse with home health. SW made Nurse CM aware.  Tilda Burrow, MSW [phone]

## 2015-11-11 NOTE — Progress Notes (Signed)
6 Days Post-Op  Subjective: Up in chair, says he feels a lot better  Objective: Vital signs in last 24 hours: Temp:  [97.5 F (36.4 C)-99.3 F (37.4 C)] 97.5 F (36.4 C) (09/25 0440) Pulse Rate:  [71-90] 71 (09/25 0440) Resp:  [16-18] 18 (09/25 0440) BP: (133-164)/(67-93) 138/82 (09/25 0440) SpO2:  [96 %-98 %] 96 % (09/25 0440) Last BM Date: 11/09/15  Intake/Output from previous day: 09/24 0701 - 09/25 0700 In: 480 [P.O.:480] Out: 600 [Urine:600] Intake/Output this shift: No intake/output data recorded.  General appearance: cooperative Resp: clear to auscultation bilaterally GI: soft, evolving ecchymosis along incision at midline, otherwise incisions OK Skin: perianal erythemotous rash  Lab Results:   Recent Labs  11/09/15 0558 11/10/15 1046  WBC 16.7* 19.6*  HGB 12.7* 11.9*  HCT 39.1 37.0*  PLT 299 305   BMET  Recent Labs  11/09/15 0558  NA 137  K 3.6  CL 103  CO2 25  GLUCOSE 101*  BUN 10  CREATININE 0.88  CALCIUM 9.1   PT/INR No results for input(s): LABPROT, INR in the last 72 hours. ABG No results for input(s): PHART, HCO3 in the last 72 hours.  Invalid input(s): PCO2, PO2  Studies/Results: Dg Chest Port 1 View  Result Date: 11/09/2015 CLINICAL DATA:  Leukocytosis EXAM: PORTABLE CHEST - 1 VIEW COMPARISON:  04/26/2015 FINDINGS: Elevation of left diaphragmatic leaflet. Atelectasis at the left lung base, slightly improved. No new infiltrate or overt edema. Heart size upper limits normal.  Atheromatous aorta. No definite effusion.  No pneumothorax. Previous median sternotomy. IMPRESSION: Slight improvement in left lower lung atelectasis. No acute findings. Electronically Signed   By: Lucrezia Europe M.D.   On: 11/09/2015 14:52    Anti-infectives: Anti-infectives    Start     Dose/Rate Route Frequency Ordered Stop   11/05/15 0630  metroNIDAZOLE (FLAGYL) IVPB 500 mg     500 mg 100 mL/hr over 60 Minutes Intravenous To ShortStay Surgical 11/04/15 1058  11/05/15 0814   11/05/15 0600  cefTRIAXone (ROCEPHIN) 2 g in dextrose 5 % 50 mL IVPB     2 g 100 mL/hr over 30 Minutes Intravenous To ShortStay Surgical 11/04/15 1058 11/05/15 0738      Assessment/Plan: POD#6 S/P colostomy takedown and repair incisional hernia Delirium - improved PT eval - rec SNF at D/C, will see their eval today as he is improving some Leukocytosis - abd exam benign, W/U unrevealing, CBC now VTE - Lovenox Perianal rash - nystatin and miconazole powders DIspo - SNF v home, OT eval  LOS: 6 days    Audreena Sachdeva E 11/11/2015

## 2015-11-11 NOTE — Progress Notes (Signed)
PT progress note  Pt improving with mobility during PT session, able to ambulate 75 ft with rw and min guard assistance. Pt with improved ability to follow commands and decreased agitation. The pt does continue to require assistance with transfers and ambulation due to poor safety awareness and increased fall risk. No family present during session or during check backs during the day to discuss D/C plan. At this time, PT continuing to recommend SNF following his acute stay.    11/11/15 1009  PT Visit Information  Last PT Received On 11/11/15  Assistance Needed +1  History of Present Illness Pt is a 70 y.o. male admitted for colostymy takedown and repair of incisional hernia on 11/05/15. PMH: PTSD, MI, HTN, DM, CHF, CAD, anxiety, TKA, Rt THA, cervical fusion.   Subjective Data  Subjective Feeling a whole lot better today.   Patient Stated Goal not expressed  Precautions  Precautions Fall  Precaution Comments history of back pain  Restrictions  Weight Bearing Restrictions No  Pain Assessment  Pain Assessment No/denies pain  Pain Intervention(s) Monitored during session  Cognition  Arousal/Alertness Awake/alert  Behavior During Therapy WFL for tasks assessed/performed  Overall Cognitive Status No family/caregiver present to determine baseline cognitive functioning  Area of Impairment Safety/judgement  Safety/Judgement Decreased awareness of safety  General Comments Pt improved but continued poor safety awareness but able to follow commands consistently throughout session.   Bed Mobility  General bed mobility comments sitting in chair upon arrival  Transfers  Overall transfer level Needs assistance  Equipment used Rolling walker (2 wheeled)  Transfers Sit to/from Stand  Sit to Stand Mod assist  General transfer comment repeated cues needed for hand placement with transfers.   Ambulation/Gait  Ambulation/Gait assistance Min guard  Ambulation Distance (Feet) 75 Feet  Assistive device  Rolling walker (2 wheeled)  Gait Pattern/deviations Step-to pattern;Narrow base of support;Trunk flexed  General Gait Details cues for posture and for safe distance from rw. Decreased stability and safety awareness during turns.  Easily distracted by other staff in room.   Gait velocity decreased  Balance  Overall balance assessment Needs assistance  Sitting-balance support No upper extremity supported  Sitting balance-Leahy Scale Fair  Standing balance support Bilateral upper extremity supported  Standing balance-Leahy Scale Poor  Standing balance comment using rw  PT - End of Session  Equipment Utilized During Treatment Gait belt  Activity Tolerance Patient tolerated treatment well  Patient left in chair;with call bell/phone within reach;with chair alarm set;with nursing/sitter in room  Nurse Communication Mobility status  PT - Assessment/Plan  PT Plan Current plan remains appropriate  PT Frequency (ACUTE ONLY) Min 2X/week  Follow Up Recommendations SNF;Supervision/Assistance - 24 hour  PT equipment None recommended by PT  PT Goal Progression  Progress towards PT goals Progressing toward goals  Acute Rehab PT Goals  PT Goal Formulation With patient/family  Time For Goal Achievement 11/20/15  Potential to Achieve Goals Fair  PT Time Calculation  PT Start Time (ACUTE ONLY) 0854  PT Stop Time (ACUTE ONLY) 0915  PT Time Calculation (min) (ACUTE ONLY) 21 min  PT General Charges  $$ ACUTE PT VISIT 1 Procedure  PT Treatments  $Gait Training 8-22 mins  Cassell Clement, PT, CSCS Pager [phone] Office [phone]

## 2015-11-12 LAB — CREATININE, SERUM
CREATININE: 1 mg/dL (ref 0.61–1.24)
GFR calc Af Amer: >60 mL/min (ref >60)
GFR calc non Af Amer: >60 mL/min (ref >60)

## 2015-11-12 LAB — GLUCOSE, CAPILLARY
GLUCOSE-CAPILLARY: 109 mg/dL — AB (ref 65–99)
GLUCOSE-CAPILLARY: 111 mg/dL — AB (ref 65–99)
Glucose-Capillary: 92 mg/dL (ref 65–99)

## 2015-11-12 MED ORDER — NYSTATIN 100000 UNIT/GM EX POWD: Freq: Two times a day (BID) | CUTANEOUS | 1 refills | Status: DC

## 2015-11-12 MED ORDER — NITROGLYCERIN 0.4 MG SL SUBL
SUBLINGUAL_TABLET | SUBLINGUAL | Status: AC
  Administered 2015-11-12: 0.4 mg
  Filled 2015-11-12: qty 1

## 2015-11-12 NOTE — Progress Notes (Signed)
Pt discharged home in stable condition despite earlier episode of angina at rest. Surgical PA responded to page and was ok with pt being discharged. Education given to wife who transported pt home

## 2015-11-12 NOTE — Care Management Important Message (Signed)
Important Message  Patient Details  Name: [name] [name] MRN: [id number] Date of Birth: Mar 05, 1945   Medicare Important Message Given:  Yes    [name] 11/12/2015, 11:11 AM

## 2015-11-12 NOTE — Progress Notes (Signed)
   11/12/15 0850  Vitals  Temp 98.5 F (36.9 C)  Temp Source Oral  BP 129/72  BP Location Left Arm  BP Method Automatic  Patient Position (if appropriate) Lying  Pulse Rate 74  Pulse Rate Source Dinamap  Resp 14  Oxygen Therapy  SpO2 97 %  O2 Device Room Air  pt requested a nitroglycerin tablet for chest pain. v/s as above. Nitro administered as ordered

## 2015-11-12 NOTE — Discharge Summary (Signed)
Physician Discharge Summary  Patient ID: [name] [name] MRN: [id number] DOB/AGE: December 30, 1945 70 y.o.  Admit date: 11/05/2015 Discharge date: 11/12/2015  Admission Diagnoses:Colostomy in place  Discharge Diagnoses: Status post colostomy takedown Active Problems:   S/P colostomy takedown   Discharged Condition: good  Hospital Course: [name] [name] underwent colostomy takedown. Postoperatively, his bowel function gradually returned. He had significant delirium and agitation. This was controlled with Haldol and the addition of Seroquel. That cleared over last weekend. He has progressed with therapies. Initially it appeared he would need skilled nursing facility but he has improved tibial to go home with home health therapies. Otherwise, he had leukocytosis which was worked up thoroughly. No cause was discovered, it may be due to the Seroquel. It is a known side effect. That will be stopped at discharge. He developed a fungal rash in his perianal region which has improved significantly on nystatin powder. We will continue that at home. I spoke with his wife again this morning to make plans for discharge home. He has the medical equipment he needs at home.  Consults: None  Significant Diagnostic Studies: above  Treatments: surgery  Discharge Exam: Blood pressure 138/75, pulse 68, temperature 97.9 F (36.6 C), temperature source Oral, resp. rate 17, height 6' (1.829 m), weight 103.2 kg (227 lb 7 oz), SpO2 97 %. see note from today  Disposition: 06-Home-Health Care Svc  Discharge Instructions    Call MD for:  persistant nausea and vomiting    Complete by:  As directed    Call MD for:  severe uncontrolled pain    Complete by:  As directed    Call MD for:  temperature >100.4    Complete by:  As directed    Diet - low sodium heart healthy    Complete by:  As directed    Increase activity slowly    Complete by:  As directed    Lifting restrictions    Complete by:  As directed    Do not lift over  10lb for 6 weeks       Medication List    TAKE these medications   allopurinol 300 MG tablet Commonly known as:  ZYLOPRIM Take 300 mg by mouth every evening.   aspirin EC 325 MG tablet Take 1 tablet (325 mg total) by mouth 2 (two) times daily after a meal. Take x 1 month post op. What changed:  when to take this  additional instructions   B-complex with vitamin C tablet Take 1 tablet by mouth daily with breakfast.   calcium-vitamin D 500-200 MG-UNIT tablet Commonly known as:  OSCAL WITH D Take 1 tablet by mouth daily with breakfast.   cholecalciferol 1000 units tablet Commonly known as:  VITAMIN D Take 2,000 Units by mouth daily.   clonazePAM 1 MG tablet Commonly known as:  KLONOPIN Take 1 mg by mouth 2 (two) times daily.   DRY EYES OP Place 1 drop into both eyes 2 (two) times daily as needed (dry eyes).   fluticasone 50 MCG/ACT nasal spray Commonly known as:  FLONASE Place 1 spray into both nostrils daily as needed.   gabapentin 300 MG capsule Commonly known as:  NEURONTIN Take 900 mg by mouth at bedtime.   HYDROcodone-acetaminophen 5-325 MG tablet Commonly known as:  NORCO/VICODIN Take 2 tablets by mouth every 4 (four) hours as needed for moderate pain.   hydrocortisone cream 1 % Apply 1 application topically daily as needed for itching (for feet).   levothyroxine 75 MCG tablet  Commonly known as:  SYNTHROID, LEVOTHROID Take 75 mcg by mouth every morning.   MAGNESIUM OXIDE PO Take 500 mg by mouth daily with breakfast.   metFORMIN 500 MG 24 hr tablet Commonly known as:  GLUCOPHAGE-XR Take 1,000 mg by mouth at bedtime.   metoprolol 100 MG tablet Commonly known as:  LOPRESSOR Take 100 mg by mouth 2 (two) times daily.   morphine 15 MG 12 hr tablet Commonly known as:  MS CONTIN Take 15 mg by mouth every 12 (twelve) hours.   nitroGLYCERIN 0.4 MG SL tablet Commonly known as:  NITROSTAT Place 1 tablet (0.4 mg total) under the tongue every 5 (five)  minutes as needed.   nystatin powder Commonly known as:  MYCOSTATIN/NYSTOP Apply topically 2 (two) times daily.   omeprazole 40 MG capsule Commonly known as:  PRILOSEC Take 40 mg by mouth daily.   potassium gluconate 595 (99 K) MG Tabs tablet Take 595 mg by mouth daily.   PROBIOTIC DAILY PO Take 1 capsule by mouth every morning.   valsartan 80 MG tablet Commonly known as:  DIOVAN Take 80 mg by mouth daily with breakfast.   venlafaxine XR 75 MG 24 hr capsule Commonly known as:  EFFEXOR-XR Take 225 mg by mouth at bedtime.        SignedZenovia Jarred 11/12/2015, 7:44 AM

## 2015-11-12 NOTE — Progress Notes (Signed)
7 Days Post-Op  Subjective: Feeling better, 2 BMs, did well with therapies  Objective: Vital signs in last 24 hours: Temp:  [97.4 F (36.3 C)-98.2 F (36.8 C)] 97.9 F (36.6 C) (09/26 0527) Pulse Rate:  [68-83] 68 (09/26 0527) Resp:  [17-18] 17 (09/26 0527) BP: (136-139)/(75-78) 138/75 (09/26 0527) SpO2:  [97 %-100 %] 97 % (09/26 0527) Last BM Date: 11/09/15  Intake/Output from previous day: 09/25 0701 - 09/26 0700 In: 360 [P.O.:360] Out: -  Intake/Output this shift: No intake/output data recorded.  General appearance: cooperative Resp: clear to auscultation bilaterally GI: soft, NT, active BS, evolving ecchymoses along incision  Lab Results:   Recent Labs  11/10/15 1046 11/11/15 1036  WBC 19.6* 19.1*  HGB 11.9* 12.8*  HCT 37.0* 39.1  PLT 305 343   BMET  Recent Labs  11/12/15 0451  CREATININE 1.00   PT/INR No results for input(s): LABPROT, INR in the last 72 hours. ABG No results for input(s): PHART, HCO3 in the last 72 hours.  Invalid input(s): PCO2, PO2  Studies/Results: No results found.  Anti-infectives: Anti-infectives    Start     Dose/Rate Route Frequency Ordered Stop   11/05/15 0630  metroNIDAZOLE (FLAGYL) IVPB 500 mg     500 mg 100 mL/hr over 60 Minutes Intravenous To ShortStay Surgical 11/04/15 1058 11/05/15 0814   11/05/15 0600  cefTRIAXone (ROCEPHIN) 2 g in dextrose 5 % 50 mL IVPB     2 g 100 mL/hr over 30 Minutes Intravenous To ShortStay Surgical 11/04/15 1058 11/05/15 0738      Assessment/Plan: s/p Procedure(s): COLOSTOMY TAKEDOWN (N/A) REPAIR INCISIONAL HERNIA (N/A) POD#7 S/P colostomy takedown and repair incisional hernia Delirium - improved PT eval - he has improved,  Leukocytosis - W/U neg, may be due to Seroquel, plan to stop at D/C VTE - Lovenox Perianal rash - nystatin and miconazole powders, improving DIspo - D/C home with [facility] therapies  LOS: 7 days    Runa Whittingham E 11/12/2015

## 2015-11-12 NOTE — Care Management Note (Signed)
Case Management Note  Patient Details  Name: ARGENIS STEEB MRN: [id number] Date of Birth: [birth year]  Subjective/Objective:                    Action/Plan:   Expected Discharge Date:                  Expected Discharge Plan:  [place]  In-House Referral:     Discharge planning Services  CM Consult  Post Acute Care Choice:    Choice offered to:  Spouse  DME Arranged:    DME Agency:     HH Arranged:  RN, PT [place] Agency:  [place]  Status of Service:  Completed, signed off  If discussed at [place] of Stay Meetings, dates discussed:    Additional Comments:  Marilu Favre, RN 11/12/2015, 10:00 AM

## 2015-11-12 NOTE — Progress Notes (Signed)
Pt reports feeling better from the nitro already. Monitoring will continue. MD paged with update

## 2015-11-26 ENCOUNTER — Encounter (HOSPITAL_COMMUNITY): Payer: Self-pay | Admitting: Emergency Medicine

## 2015-11-26 ENCOUNTER — Emergency Department (HOSPITAL_COMMUNITY)
Admission: EM | Admit: 2015-11-26 | Discharge: 2015-11-26 | Disposition: A | Discharge status: Home / Self Care | Payer: Medicare Other | Attending: Emergency Medicine | Admitting: Emergency Medicine

## 2015-11-26 DIAGNOSIS — Z79899 Other long term (current) drug therapy: Secondary | ICD-10-CM | POA: Diagnosis not present

## 2015-11-26 DIAGNOSIS — E119 Type 2 diabetes mellitus without complications: Secondary | ICD-10-CM | POA: Insufficient documentation

## 2015-11-26 DIAGNOSIS — Z7982 Long term (current) use of aspirin: Secondary | ICD-10-CM | POA: Diagnosis not present

## 2015-11-26 DIAGNOSIS — I110 Hypertensive heart disease with heart failure: Secondary | ICD-10-CM | POA: Diagnosis not present

## 2015-11-26 DIAGNOSIS — I2510 Atherosclerotic heart disease of native coronary artery without angina pectoris: Secondary | ICD-10-CM | POA: Insufficient documentation

## 2015-11-26 DIAGNOSIS — I509 Heart failure, unspecified: Secondary | ICD-10-CM | POA: Diagnosis not present

## 2015-11-26 DIAGNOSIS — Z7984 Long term (current) use of oral hypoglycemic drugs: Secondary | ICD-10-CM | POA: Diagnosis not present

## 2015-11-26 DIAGNOSIS — Z87891 Personal history of nicotine dependence: Secondary | ICD-10-CM | POA: Diagnosis not present

## 2015-11-26 DIAGNOSIS — Z859 Personal history of malignant neoplasm, unspecified: Secondary | ICD-10-CM | POA: Diagnosis not present

## 2015-11-26 DIAGNOSIS — E039 Hypothyroidism, unspecified: Secondary | ICD-10-CM | POA: Insufficient documentation

## 2015-11-26 DIAGNOSIS — Z046 Encounter for general psychiatric examination, requested by authority: Secondary | ICD-10-CM | POA: Diagnosis present

## 2015-11-26 DIAGNOSIS — Z955 Presence of coronary angioplasty implant and graft: Secondary | ICD-10-CM | POA: Insufficient documentation

## 2015-11-26 DIAGNOSIS — Z638 Other specified problems related to primary support group: Secondary | ICD-10-CM | POA: Diagnosis not present

## 2015-11-26 NOTE — ED Triage Notes (Addendum)
Per PTAR, family took out IVC paperwork because he is abusing morphine and hydrocodone-he is prescribed those meds-states he is becoming hostile with family-CBG 100

## 2015-11-26 NOTE — ED Provider Notes (Signed)
[facility] DEPT Provider Note   CSN: [id number] Arrival date & time: 11/26/15  1337  By signing my name below, I, [name] [name], attest that this documentation has been prepared under the direction and in the presence of [name] Manifold, MD. Electronically Signed: Sonum [name], Education administrator. 11/26/15. 2:09 PM.  History   Chief Complaint Chief Complaint  Patient presents with  . IVC    The history is provided by the patient and medical records. No language interpreter was used.    HPI Comments: [name] [name] is a 70 y.o. male who presents to the Emergency Department via [facility] with IVC paperwork today. Per IVC paperwork, patient has a history of drug abuse and was threatening to destroy his cars after an argument with family. He states he called Sheriff's dept today to evict his son when the family argument happened. He denies SI, HI. He denies any other physical complaints.    Past Medical History:  Diagnosis Date  . Anal fistula   . Anxiety   . Arthritis   . CAD (coronary artery disease)    a. s/p CABG 2001; b. LHC (3/09):  S-RCA, L-LAD, S-OM1/dCFX all patent, EF 55-60%;  c. myoview (12/12):  no ischemia, EF 68%;  d. Dob Echo (1/14):  + ECG changes but normal echo => med Rx cont'd  . Cancer ([place])   . CHF (congestive heart failure) ([place])   . Complication of anesthesia    CONFUSION   . Diabetes mellitus without complication ([place])   . Diverticulitis   . GERD (gastroesophageal reflux disease)   . Gout   . Hyperlipidemia   . Hypertension   . Hypothyroidism   . Myocardial infarction    no longer see cardiologist,PCP handles medications  . Pneumonia    history of pneumonia  . PTSD (post-traumatic stress disorder)     Patient Active Problem List   Diagnosis Date Noted  . S/P colostomy takedown 11/05/2015  . Abdominal pain   . Agitation   . Threatening to others   . Diverticulitis of large intestine with perforation without bleeding   . Type 2 diabetes mellitus with complication  ([place])   . CAD in native artery   . Diverticulitis 04/26/2015  . Perforated diverticulum of large intestine 04/26/2015  . Diverticulitis of colon with perforation 04/26/2015  . Depression 04/26/2015  . GERD (gastroesophageal reflux disease) 04/26/2015  . Hypothyroidism 04/26/2015  . Acute encephalopathy 08/25/2014  . Acute on chronic renal failure ([place]) 08/25/2014  . Blood poisoning ([place])   . Elevated troponin   . CAP (community acquired pneumonia) 08/24/2014  . Sepsis ([place]) 08/24/2014  . Hypokalemia 08/24/2014  . AKI (acute kidney injury) ([place]) 08/24/2014  . Alcohol abuse 08/24/2014  . DM (diabetes mellitus), type 2 ([place]) 02/28/2013  . Abnormal EKG 02/28/2013  . F[place] 11/05/2011  . Hyperlipidemia 02/06/2011  . Insomnia 02/06/2011  . Hypertension   . CAD (coronary artery disease)     Past Surgical History:  Procedure Laterality Date  . APPENDECTOMY    . BLEPHAROPLASTY Bilateral   . CERVICAL FUSION     times 2  . CHOLECYSTECTOMY    . COLOSTOMY REVERSAL  11/05/2015  . COLOSTOMY TAKEDOWN N/A 11/05/2015   Procedure: COLOSTOMY TAKEDOWN;  Surgeon: Georganna Skeans, MD;  Location: [facility];  Service: General;  Laterality: N/A;  . CORONARY ARTERY BYPASS GRAFT     2001 LIMA LAD, SVG OM/Circ dista, SVG PDA.  Cath 2009 with patent grafts  . HAND SURGERY Right   .  HIP ARTHROPLASTY Right 03/01/2013   Procedure: ARTHROPLASTY BIPOLAR HIP;  Surgeon: Alta Corning, MD;  Location: [facility];  Service: Orthopedics;  Laterality: Right;  . INCISIONAL HERNIA REPAIR N/A 11/05/2015   Procedure: REPAIR INCISIONAL HERNIA;  Surgeon: Georganna Skeans, MD;  Location: [facility];  Service: General;  Laterality: N/A;  . JOINT REPLACEMENT     Right knee  . KNEE ARTHROSCOPY Bilateral   . PARTIAL COLECTOMY N/A 04/26/2015   Procedure: EXPLORATORY LAPAROTOMY PARTIAL COLECTOMY WITH COLOSTOMY AND HARTMANN'S;  Surgeon: Georganna Skeans, MD;  Location: [facility];  Service: General;  Laterality: N/A;  . STERNAL WIRE REMOVAL     . TREATMENT FISTULA ANAL     times 3       Home Medications    Prior to Admission medications   Medication Sig Start Date End Date Taking? Authorizing Provider  allopurinol (ZYLOPRIM) 300 MG tablet Take 300 mg by mouth every evening.     Historical Provider, MD  Artificial Tear Ointment (DRY EYES OP) Place 1 drop into both eyes 2 (two) times daily as needed (dry eyes).    Historical Provider, MD  aspirin EC 325 MG tablet Take 1 tablet (325 mg total) by mouth 2 (two) times daily after a meal. Take x 1 month post op. Patient taking differently: Take 325 mg by mouth daily with breakfast.  03/01/13   Gary Fleet, PA-C  B Complex-C (B-COMPLEX WITH VITAMIN C) tablet Take 1 tablet by mouth daily with breakfast.     Historical Provider, MD  calcium-vitamin D (OSCAL WITH D) 500-200 MG-UNIT per tablet Take 1 tablet by mouth daily with breakfast.    Historical Provider, MD  cholecalciferol (VITAMIN D) 1000 UNITS tablet Take 2,000 Units by mouth daily.     Historical Provider, MD  clonazePAM (KLONOPIN) 1 MG tablet Take 1 mg by mouth 2 (two) times daily.     Historical Provider, MD  fluticasone (FLONASE) 50 MCG/ACT nasal spray Place 1 spray into both nostrils daily as needed. 04/13/15   Historical Provider, MD  gabapentin (NEURONTIN) 300 MG capsule Take 900 mg by mouth at bedtime.  02/21/13   Historical Provider, MD  HYDROcodone-acetaminophen (NORCO/VICODIN) 5-325 MG tablet Take 2 tablets by mouth every 4 (four) hours as needed for moderate pain. 05/06/15   Emina Riebock, NP  hydrocortisone cream 1 % Apply 1 application topically daily as needed for itching (for feet).    Historical Provider, MD  levothyroxine (SYNTHROID, LEVOTHROID) 75 MCG tablet Take 75 mcg by mouth every morning.  04/13/15   Historical Provider, MD  MAGNESIUM OXIDE PO Take 500 mg by mouth daily with breakfast.     Historical Provider, MD  metFORMIN (GLUCOPHAGE-XR) 500 MG 24 hr tablet Take 1,000 mg by mouth at bedtime. 04/24/15    Historical Provider, MD  metoprolol (LOPRESSOR) 100 MG tablet Take 100 mg by mouth 2 (two) times daily. 04/24/15   Historical Provider, MD  morphine (MS CONTIN) 15 MG 12 hr tablet Take 15 mg by mouth every 12 (twelve) hours.    Historical Provider, MD  nitroGLYCERIN (NITROSTAT) 0.4 MG SL tablet Place 1 tablet (0.4 mg total) under the tongue every 5 (five) minutes as needed. 02/23/12   Minus Breeding, MD  nystatin (MYCOSTATIN/NYSTOP) powder Apply topically 2 (two) times daily. 11/12/15   Georganna Skeans, MD  omeprazole (PRILOSEC) 40 MG capsule Take 40 mg by mouth daily. 04/13/15   Historical Provider, MD  potassium gluconate 595 (99 K) MG TABS tablet Take 595 mg by  mouth daily.    Historical Provider, MD  Probiotic Product (PROBIOTIC DAILY PO) Take 1 capsule by mouth every morning.     Historical Provider, MD  valsartan (DIOVAN) 80 MG tablet Take 80 mg by mouth daily with breakfast.    Historical Provider, MD  venlafaxine XR (EFFEXOR-XR) 75 MG 24 hr capsule Take 225 mg by mouth at bedtime. 04/19/15   Historical Provider, MD    Family History Family History  Problem Relation Age of Onset  . Colon cancer Mother   . Heart disease Father   . Heart disease Sister   . Heart disease Brother   . Diabetes Sister   . Heart disease Brother   . Heart disease Brother     Social History Social History  Substance Use Topics  . Smoking status: Former Smoker    Packs/day: 1.50    Years: 20.00    Types: Cigarettes    Quit date: 02/17/1976  . Smokeless tobacco: Never Used  . Alcohol use No     Allergies   Ciprofloxacin hcl and Fentanyl   Review of Systems Review of Systems  Constitutional: Negative for fever.  Psychiatric/Behavioral: Negative for agitation, dysphoric mood and suicidal ideas. The patient is not nervous/anxious.   All other systems reviewed and are negative.    Physical Exam Updated Vital Signs There were no vitals taken for this visit.  Physical Exam  Constitutional: He is  oriented to person, place, and time. He appears well-developed and well-nourished.  HENT:  Head: Normocephalic and atraumatic.  Eyes: EOM are normal.  Neck: Normal range of motion.  Cardiovascular: Normal rate, regular rhythm, normal heart sounds and intact distal pulses.   Pulmonary/Chest: Effort normal and breath sounds normal. No respiratory distress.  Abdominal: Soft. He exhibits no distension. There is no tenderness.  Musculoskeletal: Normal range of motion.  Neurological: He is alert and oriented to person, place, and time.  Skin: Skin is warm and dry.  Psychiatric: He has a normal mood and affect. His behavior is normal. Judgment and thought content normal.  Calm, cooperative. Doesn't appear to be responding to internal stimuli.   Nursing note and vitals reviewed.    ED Treatments / Results  DIAGNOSTIC STUDIES:  COORDINATION OF CARE: 2:06 PM Discussed treatment plan with pt at bedside and pt agreed to plan.   Labs (all labs ordered are listed, but only abnormal results are displayed) Labs Reviewed - No data to display  EKG  EKG Interpretation None       Radiology No results found.  Procedures Procedures (including critical care time)  Medications Ordered in ED Medications - No data to display   Initial Impression / Assessment and Plan / ED Course  I have reviewed the triage vital signs and the nursing notes.  Pertinent labs & imaging results that were available during my care of the patient were reviewed by me and considered in my medical decision making (see chart for details).  Clinical Course    69yM brought in by IVC. He denies claims but, even if true, abusing pain medication and threatening to destroy his own property is not a basis for me to uphold the commitment. I do have the impression that he is not telling me the complete story and seems to have some narcissistic traits. He does not seem psychotic though and denies SI or HI.   Final Clinical  Impressions(s) / ED Diagnoses   Final diagnoses:  Family conflict    New Prescriptions New Prescriptions  No medications on file   I personally preformed the services scribed in my presence. The recorded information has been reviewed is accurate. [name] Manifold, MD.    [name] Manifold, MD 11/26/15 [phone]

## 2015-11-26 NOTE — ED Notes (Signed)
Pt called a cab and went home.

## 2015-11-26 NOTE — ED Notes (Signed)
Bed: WA17 Expected date:  Expected time:  Means of arrival:  Comments: EMS- 70yo M, IVC/cooperative

## 2016-03-30 LAB — LIPID PANEL
Cholesterol: 158 (ref 0–200)
HDL: 30 — AB (ref 35–70)
LDL Cholesterol: 74
Triglycerides: 270 — AB (ref 40–160)

## 2016-03-30 LAB — HEPATIC FUNCTION PANEL
ALT: 10 (ref 10–40)
AST: 12 — AB (ref 14–40)
Alkaline Phosphatase: 83 (ref 25–125)

## 2016-03-30 LAB — BASIC METABOLIC PANEL
BUN: 12 (ref 4–21)
Creatinine: 1.2 (ref 0.6–1.3)
GLUCOSE: 84
POTASSIUM: 4.5 (ref 3.4–5.3)
SODIUM: 136 — AB (ref 137–147)

## 2016-03-30 LAB — HEMOGLOBIN A1C: HEMOGLOBIN A1C: 6

## 2016-05-12 ENCOUNTER — Other Ambulatory Visit: Payer: Self-pay | Admitting: General Surgery

## 2016-05-12 DIAGNOSIS — K432 Incisional hernia without obstruction or gangrene: Secondary | ICD-10-CM

## 2016-05-19 ENCOUNTER — Ambulatory Visit
Admission: RE | Admit: 2016-05-19 | Discharge: 2016-05-19 | Disposition: A | Discharge status: Home / Self Care | Payer: Medicare Other | Source: Ambulatory Visit | Attending: General Surgery | Admitting: General Surgery

## 2016-05-19 ENCOUNTER — Telehealth: Payer: Self-pay | Admitting: Cardiology

## 2016-05-19 DIAGNOSIS — K432 Incisional hernia without obstruction or gangrene: Secondary | ICD-10-CM

## 2016-05-19 MED ORDER — IOPAMIDOL (ISOVUE-300) INJECTION 61%
100.0000 mL | Freq: Once | INTRAVENOUS | Status: AC | PRN
  Administered 2016-05-19: 100 mL via INTRAVENOUS

## 2016-05-19 NOTE — Telephone Encounter (Signed)
Received records from [facility] for appointment on 05/29/16 with Dr Percival Spanish.  Records put with Dr Hochrein's schedule for 05/29/16. lp

## 2016-05-28 ENCOUNTER — Encounter: Payer: Self-pay | Admitting: Cardiology

## 2016-05-28 NOTE — Progress Notes (Signed)
Cardiology Office Note   Date:  05/30/2016   ID:  [name] [name], DOB 1945/08/01, MRN [id number]  PCP:  Merrilee Seashore, MD  Cardiologist:   Minus Breeding, MD  Referring:  Dr. Ralene Ok  Chief Complaint  Patient presents with  . Coronary Artery Disease      History of Present Illness: [name] [name] is a 71 y.o. male who presents for *CAD, status post CABG in 2001.   LHC (04/2007) demonstrated LAD occluded, then 50%, circumflex occluded, 90% into a small distal continuation branch, RCA occluded, SVG-RCA patent, LIMA-LAD patent, SVG-OM1/distal circumflex patent. EF 55-60%.   Myoview (01/2011) demonstrated no ischemia, EF 68%; normal study.  Dobutamine echocardiogram (02/2012) demonstrated a positive ECG changes but no stress-induced wall motion abnormalities on echocardiogram (normal study).  He was last seen in 2014.   He is referred by surgery for preop clearance.     The patient actually has been doing quite well. He quit drinking in 2016. He walks every day though he has some knee problems and walks with a cane. He gets his heart rate up and he denies any cardiovascular symptoms. He's had none of the chest discomfort that he previously.  The patient denies any new symptoms such as chest discomfort, neck or arm discomfort. There has been no new shortness of breath, PND or orthopnea. There have been no reported palpitations, presyncope or syncope.   Past Medical History:  Diagnosis Date  . Anal fistula   . Anxiety   . Arthritis   . CAD (coronary artery disease)    a. s/p CABG 2001; b. LHC (3/09):  S-RCA, L-LAD, S-OM1/dCFX all patent, EF 55-60%;  c. myoview (12/12):  no ischemia, EF 68%;  d. Dob Echo (1/14):  + ECG changes but normal echo => med Rx cont'd  . Cancer ([place])   . CHF (congestive heart failure) ([place])   . Complication of anesthesia    CONFUSION   . Diabetes mellitus without complication ([place])   . Diverticulitis   . GERD (gastroesophageal reflux disease)   . Gout    . Hyperlipidemia   . Hypertension   . Hypothyroidism   . PTSD (post-traumatic stress disorder)     Past Surgical History:  Procedure Laterality Date  . APPENDECTOMY    . BLEPHAROPLASTY Bilateral   . CERVICAL FUSION     times 2  . CHOLECYSTECTOMY    . COLOSTOMY REVERSAL  11/05/2015  . COLOSTOMY TAKEDOWN N/A 11/05/2015   Procedure: COLOSTOMY TAKEDOWN;  Surgeon: Georganna Skeans, MD;  Location: [facility];  Service: General;  Laterality: N/A;  . CORONARY ARTERY BYPASS GRAFT     2001 LIMA LAD, SVG OM/Circ dista, SVG PDA.  Cath 2009 with patent grafts  . HAND SURGERY Right   . HIP ARTHROPLASTY Right 03/01/2013   Procedure: ARTHROPLASTY BIPOLAR HIP;  Surgeon: Alta Corning, MD;  Location[facility];  Service: Orthopedics;  Laterality: Right;  . INCISIONAL HERNIA REPAIR N/A 11/05/2015   Procedure: REPAIR INCISIONAL HERNIA;  Surgeon: Georganna Skeans, MD;  Location: [facility];  Service: General;  Laterality: N/A;  . JOINT REPLACEMENT     Right knee  . KNEE ARTHROSCOPY Bilateral   . PARTIAL COLECTOMY N/A 04/26/2015   Procedure: EXPLORATORY LAPAROTOMY PARTIAL COLECTOMY WITH COLOSTOMY AND HARTMANN'S;  Surgeon: Georganna Skeans, MD;  Location: [facility];  Service: General;  Laterality: N/A;  . STERNAL WIRE REMOVAL    . TREATMENT FISTULA ANAL     times 3  Current Outpatient Prescriptions  Medication Sig Dispense Refill  . HYDROcodone-acetaminophen (NORCO) 10-325 MG tablet Take 2 tablets by mouth every 4 (four) hours as needed for moderate pain.  0  . allopurinol (ZYLOPRIM) 300 MG tablet Take 300 mg by mouth every evening.     . Artificial Tear Ointment (DRY EYES OP) Place 1 drop into both eyes 2 (two) times daily as needed (dry eyes).    Marland Kitchen aspirin EC 325 MG tablet Take 1 tablet (325 mg total) by mouth 2 (two) times daily after a meal. Take x 1 month post op. (Patient taking differently: Take 325 mg by mouth daily with breakfast. ) 60 tablet 0  . B Complex-C (B-COMPLEX WITH VITAMIN C) tablet Take 1 tablet by  mouth daily with breakfast.     . calcium-vitamin D (OSCAL WITH D) 500-200 MG-UNIT per tablet Take 1 tablet by mouth daily with breakfast.    . cholecalciferol (VITAMIN D) 1000 UNITS tablet Take 2,000 Units by mouth daily.     . clonazePAM (KLONOPIN) 1 MG tablet Take 1 mg by mouth 2 (two) times daily.     . fluticasone (FLONASE) 50 MCG/ACT nasal spray Place 1 spray into both nostrils daily as needed.  3  . gabapentin (NEURONTIN) 300 MG capsule Take 900 mg by mouth at bedtime.     . hydrocortisone cream 1 % Apply 1 application topically daily as needed for itching (for feet).    Marland Kitchen levothyroxine (SYNTHROID, LEVOTHROID) 75 MCG tablet Take 75 mcg by mouth every morning.   3  . MAGNESIUM OXIDE PO Take 500 mg by mouth daily with breakfast.     . metFORMIN (GLUCOPHAGE-XR) 500 MG 24 hr tablet Take 1,000 mg by mouth at bedtime.  1  . metoprolol (LOPRESSOR) 100 MG tablet Take 100 mg by mouth 2 (two) times daily.  0  . morphine (MS CONTIN) 15 MG 12 hr tablet Take 15 mg by mouth every 12 (twelve) hours.    . nitroGLYCERIN (NITROSTAT) 0.4 MG SL tablet Place 1 tablet (0.4 mg total) under the tongue every 5 (five) minutes as needed. 25 tablet 11  . omeprazole (PRILOSEC) 40 MG capsule Take 40 mg by mouth daily.  13  . potassium gluconate 595 (99 K) MG TABS tablet Take 595 mg by mouth daily.    . Probiotic Product (PROBIOTIC DAILY PO) Take 1 capsule by mouth every morning.     . valsartan (DIOVAN) 80 MG tablet Take 80 mg by mouth daily with breakfast.    . venlafaxine XR (EFFEXOR-XR) 75 MG 24 hr capsule Take 225 mg by mouth at bedtime.  3   No current facility-administered medications for this visit.     Allergies:   Ciprofloxacin hcl and Fentanyl     ROS:  Please see the history of present illness.   Otherwise, review of systems are positive for joint pain and history of ICU psychosis (which makes him nervous for the upcoming surgery).   All other systems are reviewed and negative.    PHYSICAL  EXAM: VS:  BP 126/68   Pulse 68   Ht 6' (1.829 m)   Wt 226 lb (102.5 kg)   BMI 30.65 kg/m  , BMI Body mass index is 30.65 kg/m. GENERAL:  Well appearing HEENT:  Pupils equal round and reactive, fundi not visualized, oral mucosa unremarkable NECK:  No jugular venous distention, waveform within normal limits, carotid upstroke brisk and symmetric, no bruits, no thyromegaly LYMPHATICS:  No cervical, inguinal adenopathy  LUNGS:  Clear to auscultation bilaterally BACK:  No CVA tenderness CHEST:  Well healed sternotomy scar. HEART:  PMI not displaced or sustained,S1 and S2 within normal limits, no S3, no S4, no clicks, no rubs, no murmurs ABD:  Flat, positive bowel sounds normal in frequency in pitch, no bruits, no rebound, no guarding, no midline pulsatile mass, no hepatomegaly, no splenomegaly, ventral hernia EXT:  2 plus pulses throughout, no edema, no cyanosis no clubbing SKIN:  No rashes no nodules NEURO:  Cranial nerves II through XII grossly intact, motor grossly intact throughout PSYCH:  Cognitively intact, oriented to person place and time    EKG:  EKG is ordered today. The ekg ordered today demonstrates sinus rhythm, rate 68, axis within normal limits, intervals within normal limits, nonspecific lateral T-wave inversions .     Recent Labs: 11/09/2015: BUN 10; Potassium 3.6; Sodium 137 11/11/2015: Hemoglobin 12.8; Platelets 343 11/12/2015: Creatinine, Ser 1.00    Lipid Panel    Component Value Date/Time   CHOL 121 04/28/2015 0529   TRIG 173 (H) 04/28/2015 0529   HDL 29 (L) 04/28/2015 0529   CHOLHDL 4.2 04/28/2015 0529   VLDL 35 04/28/2015 0529   LDLCALC 57 04/28/2015 0529      Wt Readings from Last 3 Encounters:  05/29/16 226 lb (102.5 kg)  11/05/15 227 lb 7 oz (103.2 kg)  10/28/15 227 lb 7 oz (103.2 kg)      Other studies Reviewed: Additional studies/ records that were reviewed today include: Previous office records including labs from [facility],  MD. Review of the above records demonstrates:  Please see elsewhere in the note.     ASSESSMENT AND PLAN:   CAD:   The patient has no cardiovascular complaints since his most recent stress test. He has a high functional level. No cardiovascular testing is indicated.  PREOP:   Given the high functional level with the absence of chest pain or high risk findings no further cardiovascular testing is suggested.  Therefore, based on ACC/AHA guidelines, the patient would be at acceptable risk for the planned procedure without further cardiovascular testing.  DM:  I did review his last A1c was 6.0 earlier here.  HTN:   The blood pressure is at target. No change in medications is indicated. We will continue with therapeutic lifestyle changes (TLC).  DYSLIPIDEMIA:    His LDL was 54 with an HDL of 30.   No change in therapy is planned.   Current medicines are reviewed at length with the patient today.  The patient does not have concerns regarding medicines.  The following changes have been made:  no change  Labs/ tests ordered today include: None  Orders Placed This Encounter  Procedures  . EKG 12-Lead     Disposition:   FU with me in one year.     Signed, Minus Breeding, MD  05/30/2016 12:09 PM    [facility]

## 2016-05-29 ENCOUNTER — Encounter: Payer: Self-pay | Admitting: Cardiology

## 2016-05-29 ENCOUNTER — Ambulatory Visit (INDEPENDENT_AMBULATORY_CARE_PROVIDER_SITE_OTHER): Payer: Medicare Other | Admitting: Cardiology

## 2016-05-29 VITALS — BP 126/68 | HR 68 | Ht 72.0 in | Wt 226.0 lb

## 2016-05-29 DIAGNOSIS — I10 Essential (primary) hypertension: Secondary | ICD-10-CM | POA: Diagnosis not present

## 2016-05-29 DIAGNOSIS — I25810 Atherosclerosis of coronary artery bypass graft(s) without angina pectoris: Secondary | ICD-10-CM

## 2016-05-29 DIAGNOSIS — E118 Type 2 diabetes mellitus with unspecified complications: Secondary | ICD-10-CM | POA: Diagnosis not present

## 2016-05-29 DIAGNOSIS — Z01810 Encounter for preprocedural cardiovascular examination: Secondary | ICD-10-CM | POA: Diagnosis not present

## 2016-05-29 NOTE — Patient Instructions (Signed)
Your physician wants you to follow-up in: ONE YEAR WITH DR HOCHREIN You will receive a reminder letter in the mail two months in advance. If you don't receive a letter, please call our office to schedule the follow-up appointment.   If you need a refill on your cardiac medications before your next appointment, please call your pharmacy.  

## 2016-05-30 ENCOUNTER — Encounter: Payer: Self-pay | Admitting: Cardiology

## 2016-05-30 DIAGNOSIS — Z01810 Encounter for preprocedural cardiovascular examination: Secondary | ICD-10-CM | POA: Insufficient documentation

## 2016-06-02 ENCOUNTER — Ambulatory Visit: Payer: Self-pay | Admitting: General Surgery
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
## 2016-07-17 NOTE — Pre-Procedure Instructions (Signed)
[name] [name]  07/17/2016      [facility] ([facility] [place] [address] [place] ([place])  [address] Phone: [phone] Fax: [phone]  [place], [place] [address] Phone: [phone] Fax: [phone]    Your procedure is scheduled on Monday June 11.  Report to [facility] Admitting at 5:30 A.M.  Call this number if you have problems the morning of surgery:  [phone]   Remember:  Do not eat food or drink liquids after midnight.  Take these medicines the morning of surgery with A SIP OF WATER:  gabapentin (neurontin), levothyroxine (synthroid), metoprolol (lopressor), omeprazole (prilosec), clonazepam (klonopin) if needed, acetaminophen (tylenol) if needed  7 days prior to surgery STOP taking any Aspirin, Aleve, Naproxen, Ibuprofen, Motrin, Advil, Goody's, BC's, all herbal medications, fish oil, and all vitamins     How to Manage Your Diabetes Before and After Surgery  Why is it important to control my blood sugar before and after surgery? . Improving blood sugar levels before and after surgery helps healing and can limit problems. . A way of improving blood sugar control is eating a healthy diet by: o  Eating less sugar and carbohydrates o  Increasing activity/exercise o  Talking with your doctor about reaching your blood sugar goals . High blood sugars (greater than 180 mg/dL) can raise your risk of infections and slow your recovery, so you will need to focus on controlling your diabetes during the weeks before surgery. . Make sure that the doctor who takes care of your diabetes knows about your planned surgery including the date and location.  How do I manage my blood sugar before surgery? . Check your blood sugar at least 4 times a day, starting 2 days before surgery, to make sure that the level is not too high or low. o Check your  blood sugar the morning of your surgery when you wake up and every 2 hours until you get to the Short Stay unit. . If your blood sugar is less than 70 mg/dL, you will need to treat for low blood sugar: o Do not take insulin. o Treat a low blood sugar (less than 70 mg/dL) with  cup of clear juice (cranberry or apple), 4 glucose tablets, OR glucose gel. o Recheck blood sugar in 15 minutes after treatment (to make sure it is greater than 70 mg/dL). If your blood sugar is not greater than 70 mg/dL on recheck, call [phone] for further instructions. . Report your blood sugar to the short stay nurse when you get to Short Stay.  . If you are admitted to the hospital after surgery: o Your blood sugar will be checked by the staff and you will probably be given insulin after surgery (instead of oral diabetes medicines) to make sure you have good blood sugar levels. o The goal for blood sugar control after surgery is 80-180 mg/dL.              WHAT DO I DO ABOUT MY DIABETES MEDICATION?   Marland Kitchen Do not take oral diabetes medicines (pills) the morning of surgery. DO NOT TAKE metformin (glucophage) the day of surgery.   . The day of surgery, do not take other diabetes injectables, including Byetta (exenatide), Bydureon (exenatide ER), Victoza (liraglutide), or Trulicity (dulaglutide).  . If your CBG is greater than 220 mg/dL, you may take  of your sliding scale (correction) dose of insulin.    Do not wear jewelry, make-up or nail polish.  Do not wear lotions, powders, or perfumes, or deoderant.  Do not shave 48 hours prior to surgery.  Men may shave face and neck.  Do not bring valuables to the hospital.  [facility] is not responsible for any belongings or valuables.  Contacts, dentures or bridgework may not be worn into surgery.  Leave your suitcase in the car.  After surgery it may be brought to your room.  For patients admitted to the hospital, discharge time will be determined by your  treatment team.  Patients discharged the day of surgery will not be allowed to drive home.    Special instructions:    [place]- Preparing For Surgery  Before surgery, you can play an important role. Because skin is not sterile, your skin needs to be as free of germs as possible. You can reduce the number of germs on your skin by washing with CHG (chlorahexidine gluconate) Soap before surgery.  CHG is an antiseptic cleaner which kills germs and bonds with the skin to continue killing germs even after washing.  Please do not use if you have an allergy to CHG or antibacterial soaps. If your skin becomes reddened/irritated stop using the CHG.  Do not shave (including legs and underarms) for at least 48 hours prior to first CHG shower. It is OK to shave your face.  Please follow these instructions carefully.   1. Shower the NIGHT BEFORE SURGERY and the MORNING OF SURGERY with CHG.   2. If you chose to wash your hair, wash your hair first as usual with your normal shampoo.  3. After you shampoo, rinse your hair and body thoroughly to remove the shampoo.  4. Use CHG as you would any other liquid soap. You can apply CHG directly to the skin and wash gently with a scrungie or a clean washcloth.   5. Apply the CHG Soap to your body ONLY FROM THE NECK DOWN.  Do not use on open wounds or open sores. Avoid contact with your eyes, ears, mouth and genitals (private parts). Wash genitals (private parts) with your normal soap.  6. Wash thoroughly, paying special attention to the area where your surgery will be performed.  7. Thoroughly rinse your body with warm water from the neck down.  8. DO NOT shower/wash with your normal soap after using and rinsing off the CHG Soap.  9. Pat yourself dry with a CLEAN TOWEL.   10. Wear CLEAN PAJAMAS   11. Place CLEAN SHEETS on your bed the night of your first shower and DO NOT SLEEP WITH PETS.    Day of Surgery: Do not apply any deodorants/lotions.  Please wear clean clothes to the hospital/surgery center.      Please read over the following fact sheets that you were given. MRSA Information

## 2016-07-20 ENCOUNTER — Encounter (HOSPITAL_COMMUNITY)
Admission: RE | Admit: 2016-07-20 | Discharge: 2016-07-20 | Disposition: A | Discharge status: Home / Self Care | Payer: Medicare Other | Source: Ambulatory Visit | Attending: General Surgery | Admitting: General Surgery

## 2016-07-20 ENCOUNTER — Encounter (HOSPITAL_COMMUNITY): Payer: Self-pay

## 2016-07-20 DIAGNOSIS — Z01812 Encounter for preprocedural laboratory examination: Secondary | ICD-10-CM | POA: Diagnosis not present

## 2016-07-20 DIAGNOSIS — K432 Incisional hernia without obstruction or gangrene: Secondary | ICD-10-CM | POA: Diagnosis not present

## 2016-07-20 HISTORY — DX: Age-related osteoporosis without current pathological fracture: M81.0

## 2016-07-20 HISTORY — DX: Scoliosis, unspecified: M41.9

## 2016-07-20 HISTORY — DX: Spinal stenosis, site unspecified: M48.00

## 2016-07-20 HISTORY — DX: Other intervertebral disc degeneration, lumbar region: M51.36

## 2016-07-20 HISTORY — DX: Personal history of pneumonia (recurrent): Z87.01

## 2016-07-20 HISTORY — DX: Other intervertebral disc degeneration, lumbar region without mention of lumbar back pain or lower extremity pain: M51.369

## 2016-07-20 LAB — BASIC METABOLIC PANEL
Anion gap: 9 (ref 5–15)
BUN: 12 mg/dL (ref 6–20)
CALCIUM: 8.9 mg/dL (ref 8.9–10.3)
CO2: 28 mmol/L (ref 22–32)
CREATININE: 1.18 mg/dL (ref 0.61–1.24)
Chloride: 98 mmol/L — ABNORMAL LOW (ref 101–111)
GFR calc Af Amer: >60 mL/min (ref >60)
GFR calc non Af Amer: >60 mL/min (ref >60)
Glucose, Bld: 92 mg/dL (ref 65–99)
Potassium: 4.2 mmol/L (ref 3.5–5.1)
SODIUM: 135 mmol/L (ref 135–145)

## 2016-07-20 LAB — CBC
HEMATOCRIT: 34.6 % — AB (ref 39.0–52.0)
Hemoglobin: 10.2 g/dL — ABNORMAL LOW (ref 13.0–17.0)
MCH: 24.2 pg — ABNORMAL LOW (ref 26.0–34.0)
MCHC: 29.5 g/dL — AB (ref 30.0–36.0)
MCV: 82 fL (ref 78.0–100.0)
PLATELETS: 255 10*3/uL (ref 150–400)
RBC: 4.22 MIL/uL (ref 4.22–5.81)
RDW: 15.9 % — AB (ref 11.5–15.5)
WBC: 7.8 10*3/uL (ref 4.0–10.5)

## 2016-07-20 LAB — GLUCOSE, CAPILLARY: Glucose-Capillary: 99 mg/dL (ref 65–99)

## 2016-07-20 LAB — SURGICAL PCR SCREEN
MRSA, PCR: NEGATIVE
STAPHYLOCOCCUS AUREUS: NEGATIVE

## 2016-07-20 MED ORDER — CHLORHEXIDINE GLUCONATE CLOTH 2 % EX PADS: 6.0000 | MEDICATED_PAD | Freq: Once | CUTANEOUS | Status: DC

## 2016-07-20 NOTE — Progress Notes (Addendum)
PCP - Merrilee Seashore, last office note and Hgb A1c requested from MD Cardiologist - Dr. Percival Spanish  EKG - 05/30/16 Stress Test - 03/08/12 ECHO - 08/26/14 Pt with hx CABG in 2001 at [facility]. Pt denies any recent cardiac issues. Pt states MD told him to stop his Aspirin 7 days prior to surgery.   Pt wife states that patient has history of post-op psychosis d/t fentanyl that lasted for a week while in the hospital. Pt also takes large amounts of pain medicine. States she will speak to anesthesiologist about this the day of surgery.   Fasting Blood Sugar - pt reports usually in 90s.   Patient denies shortness of breath, fever, cough and chest pain at PAT appointment   Patient verbalized understanding of instructions that were given to them at the PAT appointment. Patient was also instructed that they will need to review over the PAT instructions again at home before surgery.

## 2016-07-20 NOTE — Progress Notes (Signed)
   07/20/16 1116  OBSTRUCTIVE SLEEP APNEA  Have you ever been diagnosed with sleep apnea through a sleep study? No  Do you snore loudly (loud enough to be heard through closed doors)?  1  Do you often feel tired, fatigued, or sleepy during the daytime (such as falling asleep during driving or talking to someone)? 0  Has anyone observed you stop breathing during your sleep? 0  Do you have, or are you being treated for high blood pressure? 1  BMI more than 35 kg/m2? 0  Age > 50 (1-yes) 1  Neck circumference greater than:Male 16 inches or larger, Male 17inches or larger? 1  Male Gender (Yes=1) 1  Obstructive Sleep Apnea Score 5

## 2016-07-21 NOTE — Progress Notes (Signed)
Anesthesia Chart Review:  Pt is a 71 year old male scheduled for exploratory laparotomy, lysis of adhesions, incisional hernia repair, insertion of mesh on 07/27/2016 with Ralene Ok, M.D.  - PCP is Merrilee Seashore, MD. Last office visit 07/20/16 - Cardiologist is Minus Breeding, MD, who cleared pt for surgery at last office visit 05/29/16.   PMH includes: CAD (s/p CABG 2001), CHF, HTN, DM, hyperlipidemia, hypothyroidism, scoliosis, PTSD, GERD. Former smoker. BMI 32. S/p colostomy takedown 11/05/15. S/p ex lap, partial colectomy 04/26/15.  S/p R arthroplasty bipolar hip 03/01/13.   Medications include: ASA 325 mg, levothyroxine, metformin, metoprolol, Prilosec, potassium, valsartan  Preoperative labs reviewed. - HbA1c was 6.0 on 03/30/16 at PCP's office.   1 view CXR 11/09/15: Slight improvement in left lower lung atelectasis. No acute findings.  EKG 05/29/16: Sinus rhythm with first-degree AV block. Nonspecific ST and T-wave abnormality.  Echo 08/26/14:  - Left ventricle: The cavity size was normal. Wall thickness was normal. Systolic function was normal. The estimated ejection fraction was in the range of 55% to 60%. Wall motion was normal; there were no regional wall motion abnormalities. Features are consistent with a pseudonormal left ventricular filling pattern, with concomitant abnormal relaxation and increased filling pressure (grade 2 diastolic dysfunction). - Aortic valve: There was mild regurgitation. - Left atrium: The atrium was mildly dilated.  Stress echo 03/08/12:  - Stress ECG conclusions: The stress ECG was consistent withmyocardial ischemia. - Staged echo: There was no echocardiographic evidence forstress-induced ischemia. Impressions: - Dobutamine echocardiogram with chest pain and ECG changes;images technically difficult but no obvious stress-inducedwall motion abnormalities.  Cardiac cath 04/25/07:  1. Patent LIMA to the LAD, patent saphenous vein graft to the obtuse  marginal 1 and patent limb to the distal left circumflex with a patent saphenous vein graft to the distal right coronary artery. 2. Totally occluded native circulation. 3. Well preserved global left ventricular function. 4. No abdominal aortic aneurysm noted.  If no changes, I anticipate pt can proceed with surgery as scheduled.   Willeen Cass, FNP-BC [facility] Short Stay Surgical Center/Anesthesiology Phone: [phone] 07/21/2016 1:16 PM

## 2016-07-26 ENCOUNTER — Telehealth: Payer: Self-pay | Admitting: General Surgery

## 2016-07-26 NOTE — Telephone Encounter (Signed)
[name] [name] called about her husband [name] [name].  He is scheduled to have an ventral incisional hernia repair by Dr. Rosendo Gros tomorrow and is doing the bowel prep.  She reports that he fell yesterday and has multiple abrasions which have been cleaned and dressed.  She reports that he is still having wrist pain and some right sided chest pain.  I told her that the ideal situation would be to have him looked at today and x-rays done if warranted.  She is concerned because he is going to the bathroom frequently due to the bowel prep.  The other optional would be to have Dr. Rosendo Gros evaluate him in the morning to see if he needed to have x-rays.  She prefers the later option.  I told her I would inform Dr. Rosendo Gros.

## 2016-07-26 NOTE — Anesthesia Preprocedure Evaluation (Signed)
Anesthesia Evaluation  Patient identified by MRN, date of birth, ID band Patient awake    Reviewed: Allergy & Precautions, NPO status , Patient's Chart, lab work & pertinent test results  History of Anesthesia Complications Negative for: history of anesthetic complications  Airway Mallampati: II  TM Distance: >3 FB Neck ROM: Full    Dental  (+) Caps, Teeth Intact, Dental Advisory Given   Pulmonary COPD, former smoker,    breath sounds clear to auscultation       Cardiovascular hypertension, Pt. on medications and Pt. on home beta blockers (-) angina+ CAD, + Past MI, + CABG and +CHF   Rhythm:Regular Rate:Normal  '16 ECHO: EF 55-60%, mild AI '14 Dob Echo:  + ECG changes but normal echo => med Rx cont'd   Neuro/Psych Anxiety Depression negative neurological ROS     GI/Hepatic GERD  Controlled,(+)     substance abuse  alcohol use,   Endo/Other  diabetes, Oral Hypoglycemic AgentsHypothyroidism   Renal/GU Renal diseasenegative Renal ROS     Musculoskeletal  (+) Arthritis , Osteoarthritis,    Abdominal (+) + obese,   Peds  Hematology   Anesthesia Other Findings   Reproductive/Obstetrics                             Anesthesia Physical  Anesthesia Plan  ASA: III  Anesthesia Plan: General   Post-op Pain Management:    Induction: Intravenous  PONV Risk Score and Plan: 2 and Ondansetron, Dexamethasone and Treatment may vary due to age or medical condition  Airway Management Planned: Oral ETT  Additional Equipment:   Intra-op Plan:   Post-operative Plan: Extubation in OR  Informed Consent: I have reviewed the patients History and Physical, chart, labs and discussed the procedure including the risks, benefits and alternatives for the proposed anesthesia with the patient or authorized representative who has indicated his/her understanding and acceptance.   Dental advisory  given  Plan Discussed with: CRNA  Anesthesia Plan Comments:         Anesthesia Quick Evaluation

## 2016-07-27 ENCOUNTER — Encounter (HOSPITAL_COMMUNITY)
Admission: RE | Disposition: A | Discharge status: Home Health | Payer: Self-pay | Source: Ambulatory Visit | Attending: General Surgery

## 2016-07-27 ENCOUNTER — Inpatient Hospital Stay (HOSPITAL_COMMUNITY): Payer: Medicare Other | Admitting: Emergency Medicine

## 2016-07-27 ENCOUNTER — Inpatient Hospital Stay (HOSPITAL_COMMUNITY)
Admission: RE | Admit: 2016-07-27 | Discharge: 2016-08-01 | DRG: 354 | Disposition: A | Discharge status: Home Health | Payer: Medicare Other | Source: Ambulatory Visit | Attending: General Surgery | Admitting: General Surgery

## 2016-07-27 ENCOUNTER — Inpatient Hospital Stay (HOSPITAL_COMMUNITY): Payer: Medicare Other

## 2016-07-27 ENCOUNTER — Inpatient Hospital Stay (HOSPITAL_COMMUNITY): Payer: Medicare Other | Admitting: Anesthesiology

## 2016-07-27 DIAGNOSIS — Z6831 Body mass index (BMI) 31.0-31.9, adult: Secondary | ICD-10-CM

## 2016-07-27 DIAGNOSIS — F419 Anxiety disorder, unspecified: Secondary | ICD-10-CM | POA: Diagnosis present

## 2016-07-27 DIAGNOSIS — Z951 Presence of aortocoronary bypass graft: Secondary | ICD-10-CM | POA: Diagnosis not present

## 2016-07-27 DIAGNOSIS — Z9889 Other specified postprocedural states: Secondary | ICD-10-CM

## 2016-07-27 DIAGNOSIS — K219 Gastro-esophageal reflux disease without esophagitis: Secondary | ICD-10-CM | POA: Diagnosis present

## 2016-07-27 DIAGNOSIS — E039 Hypothyroidism, unspecified: Secondary | ICD-10-CM | POA: Diagnosis present

## 2016-07-27 DIAGNOSIS — K432 Incisional hernia without obstruction or gangrene: Principal | ICD-10-CM | POA: Diagnosis present

## 2016-07-27 DIAGNOSIS — Z885 Allergy status to narcotic agent status: Secondary | ICD-10-CM

## 2016-07-27 DIAGNOSIS — Z79899 Other long term (current) drug therapy: Secondary | ICD-10-CM | POA: Diagnosis not present

## 2016-07-27 DIAGNOSIS — Z7951 Long term (current) use of inhaled steroids: Secondary | ICD-10-CM

## 2016-07-27 DIAGNOSIS — F05 Delirium due to known physiological condition: Secondary | ICD-10-CM | POA: Diagnosis not present

## 2016-07-27 DIAGNOSIS — Z881 Allergy status to other antibiotic agents status: Secondary | ICD-10-CM | POA: Diagnosis not present

## 2016-07-27 DIAGNOSIS — J449 Chronic obstructive pulmonary disease, unspecified: Secondary | ICD-10-CM | POA: Diagnosis present

## 2016-07-27 DIAGNOSIS — E669 Obesity, unspecified: Secondary | ICD-10-CM | POA: Diagnosis present

## 2016-07-27 DIAGNOSIS — Z7984 Long term (current) use of oral hypoglycemic drugs: Secondary | ICD-10-CM | POA: Diagnosis not present

## 2016-07-27 DIAGNOSIS — E119 Type 2 diabetes mellitus without complications: Secondary | ICD-10-CM | POA: Diagnosis present

## 2016-07-27 DIAGNOSIS — Z87891 Personal history of nicotine dependence: Secondary | ICD-10-CM | POA: Diagnosis not present

## 2016-07-27 DIAGNOSIS — Z7982 Long term (current) use of aspirin: Secondary | ICD-10-CM | POA: Diagnosis not present

## 2016-07-27 DIAGNOSIS — Z8719 Personal history of other diseases of the digestive system: Secondary | ICD-10-CM

## 2016-07-27 DIAGNOSIS — Z79891 Long term (current) use of opiate analgesic: Secondary | ICD-10-CM | POA: Diagnosis not present

## 2016-07-27 DIAGNOSIS — Z8701 Personal history of pneumonia (recurrent): Secondary | ICD-10-CM | POA: Diagnosis not present

## 2016-07-27 DIAGNOSIS — I259 Chronic ischemic heart disease, unspecified: Secondary | ICD-10-CM

## 2016-07-27 DIAGNOSIS — W19XXXA Unspecified fall, initial encounter: Secondary | ICD-10-CM

## 2016-07-27 DIAGNOSIS — R079 Chest pain, unspecified: Secondary | ICD-10-CM

## 2016-07-27 DIAGNOSIS — Z23 Encounter for immunization: Secondary | ICD-10-CM | POA: Diagnosis present

## 2016-07-27 HISTORY — PX: LYSIS OF ADHESION: SHX5961

## 2016-07-27 HISTORY — PX: INCISIONAL HERNIA REPAIR: SHX193

## 2016-07-27 HISTORY — PX: LAPAROTOMY: SHX154

## 2016-07-27 HISTORY — PX: INSERTION OF MESH: SHX5868

## 2016-07-27 LAB — GLUCOSE, CAPILLARY
GLUCOSE-CAPILLARY: 113 mg/dL — AB (ref 65–99)
GLUCOSE-CAPILLARY: 126 mg/dL — AB (ref 65–99)
Glucose-Capillary: 165 mg/dL — ABNORMAL HIGH (ref 65–99)
Glucose-Capillary: 216 mg/dL — ABNORMAL HIGH (ref 65–99)

## 2016-07-27 LAB — TYPE AND SCREEN
ABO/RH(D): O POS
Antibody Screen: NEGATIVE

## 2016-07-27 SURGERY — LAPAROTOMY, EXPLORATORY
Anesthesia: General | Site: Abdomen

## 2016-07-27 MED ORDER — ALLOPURINOL 300 MG PO TABS
300.0000 mg | ORAL_TABLET | Freq: Every day | ORAL | Status: DC
  Administered 2016-07-27 – 2016-07-31 (×5): 300 mg via ORAL
  Filled 2016-07-27 (×6): qty 1

## 2016-07-27 MED ORDER — HYDROMORPHONE HCL 1 MG/ML IJ SOLN
INTRAMUSCULAR | Status: AC
  Filled 2016-07-27: qty 0.5

## 2016-07-27 MED ORDER — ONDANSETRON HCL 4 MG/2ML IJ SOLN
INTRAMUSCULAR | Status: DC | PRN
  Administered 2016-07-27: 4 mg via INTRAVENOUS

## 2016-07-27 MED ORDER — METOPROLOL TARTRATE 100 MG PO TABS
100.0000 mg | ORAL_TABLET | Freq: Two times a day (BID) | ORAL | Status: DC
  Administered 2016-07-27 – 2016-08-01 (×9): 100 mg via ORAL
  Filled 2016-07-27 (×9): qty 1

## 2016-07-27 MED ORDER — HYDRALAZINE HCL 20 MG/ML IJ SOLN
INTRAMUSCULAR | Status: AC
  Filled 2016-07-27: qty 1

## 2016-07-27 MED ORDER — SODIUM CHLORIDE 0.9% FLUSH: 9.0000 mL | INTRAVENOUS | Status: DC | PRN

## 2016-07-27 MED ORDER — LIDOCAINE HCL (CARDIAC) 20 MG/ML IV SOLN
INTRAVENOUS | Status: DC | PRN
  Administered 2016-07-27: 100 mg via INTRATRACHEAL

## 2016-07-27 MED ORDER — HYDRALAZINE HCL 20 MG/ML IJ SOLN
5.0000 mg | Freq: Once | INTRAMUSCULAR | Status: AC
  Administered 2016-07-27: 5 mg via INTRAVENOUS

## 2016-07-27 MED ORDER — ALVIMOPAN 12 MG PO CAPS
12.0000 mg | ORAL_CAPSULE | ORAL | Status: AC
  Administered 2016-07-27: 12 mg via ORAL
  Filled 2016-07-27: qty 1

## 2016-07-27 MED ORDER — PANTOPRAZOLE SODIUM 40 MG PO TBEC
40.0000 mg | DELAYED_RELEASE_TABLET | Freq: Every day | ORAL | Status: DC
  Administered 2016-07-27 – 2016-08-01 (×5): 40 mg via ORAL
  Filled 2016-07-27 (×5): qty 1

## 2016-07-27 MED ORDER — PHENYLEPHRINE HCL 10 MG/ML IJ SOLN
INTRAVENOUS | Status: DC | PRN
  Administered 2016-07-27: 25 ug/min via INTRAVENOUS

## 2016-07-27 MED ORDER — PROPOFOL 10 MG/ML IV BOLUS
INTRAVENOUS | Status: AC
  Filled 2016-07-27: qty 20

## 2016-07-27 MED ORDER — INSULIN ASPART 100 UNIT/ML ~~LOC~~ SOLN
0.0000 [IU] | Freq: Three times a day (TID) | SUBCUTANEOUS | Status: DC
  Administered 2016-07-28 – 2016-07-29 (×3): 4 [IU] via SUBCUTANEOUS
  Administered 2016-07-29 – 2016-07-30 (×3): 3 [IU] via SUBCUTANEOUS
  Administered 2016-07-30: 4 [IU] via SUBCUTANEOUS
  Administered 2016-07-31 – 2016-08-01 (×4): 3 [IU] via SUBCUTANEOUS

## 2016-07-27 MED ORDER — HYDROMORPHONE 1 MG/ML IV SOLN
INTRAVENOUS | Status: AC
  Filled 2016-07-27: qty 25

## 2016-07-27 MED ORDER — DIPHENHYDRAMINE HCL 12.5 MG/5ML PO ELIX
12.5000 mg | ORAL_SOLUTION | Freq: Four times a day (QID) | ORAL | Status: DC | PRN
  Administered 2016-07-27: 12.5 mg via ORAL
  Filled 2016-07-27: qty 5

## 2016-07-27 MED ORDER — HYDROMORPHONE HCL 1 MG/ML IJ SOLN
INTRAMUSCULAR | Status: DC | PRN
  Administered 2016-07-27: 0.5 mg via INTRAVENOUS

## 2016-07-27 MED ORDER — ACETAMINOPHEN 500 MG PO TABS
1000.0000 mg | ORAL_TABLET | ORAL | Status: AC
  Administered 2016-07-27: 1000 mg via ORAL
  Filled 2016-07-27: qty 2

## 2016-07-27 MED ORDER — CLONAZEPAM 1 MG PO TABS
1.0000 mg | ORAL_TABLET | Freq: Three times a day (TID) | ORAL | Status: DC | PRN
  Administered 2016-07-27 – 2016-07-31 (×7): 1 mg via ORAL
  Filled 2016-07-27 (×8): qty 1

## 2016-07-27 MED ORDER — FENTANYL CITRATE (PF) 250 MCG/5ML IJ SOLN
INTRAMUSCULAR | Status: DC | PRN
  Administered 2016-07-27 (×2): 100 ug via INTRAVENOUS
  Administered 2016-07-27: 50 ug via INTRAVENOUS

## 2016-07-27 MED ORDER — HYDRALAZINE HCL 20 MG/ML IJ SOLN
10.0000 mg | Freq: Four times a day (QID) | INTRAMUSCULAR | Status: DC | PRN
  Administered 2016-07-27 – 2016-07-28 (×3): 10 mg via INTRAVENOUS
  Filled 2016-07-27 (×3): qty 1

## 2016-07-27 MED ORDER — VENLAFAXINE HCL ER 75 MG PO CP24
225.0000 mg | ORAL_CAPSULE | Freq: Every day | ORAL | Status: DC
  Administered 2016-07-27 – 2016-07-31 (×5): 225 mg via ORAL
  Filled 2016-07-27 (×5): qty 3

## 2016-07-27 MED ORDER — NITROGLYCERIN 0.4 MG SL SUBL: 0.4000 mg | SUBLINGUAL_TABLET | SUBLINGUAL | Status: DC | PRN

## 2016-07-27 MED ORDER — GLYCOPYRROLATE 0.2 MG/ML IJ SOLN
INTRAMUSCULAR | Status: DC | PRN
  Administered 2016-07-27: .2 mg via INTRAVENOUS

## 2016-07-27 MED ORDER — ROCURONIUM BROMIDE 100 MG/10ML IV SOLN
INTRAVENOUS | Status: DC | PRN
  Administered 2016-07-27: 20 mg via INTRAVENOUS
  Administered 2016-07-27: 50 mg via INTRAVENOUS

## 2016-07-27 MED ORDER — PROMETHAZINE HCL 25 MG/ML IJ SOLN: 6.2500 mg | INTRAMUSCULAR | Status: DC | PRN

## 2016-07-27 MED ORDER — ALUM & MAG HYDROXIDE-SIMETH 200-200-20 MG/5ML PO SUSP
30.0000 mL | ORAL | Status: DC | PRN
  Administered 2016-07-27 – 2016-07-31 (×6): 30 mL via ORAL
  Filled 2016-07-27 (×6): qty 30

## 2016-07-27 MED ORDER — GABAPENTIN 300 MG PO CAPS
300.0000 mg | ORAL_CAPSULE | ORAL | Status: DC
  Filled 2016-07-27: qty 1

## 2016-07-27 MED ORDER — ACETAMINOPHEN 500 MG PO TABS
1000.0000 mg | ORAL_TABLET | Freq: Three times a day (TID) | ORAL | Status: AC
  Administered 2016-07-27 – 2016-07-30 (×7): 1000 mg via ORAL
  Filled 2016-07-27 (×8): qty 2

## 2016-07-27 MED ORDER — SUGAMMADEX SODIUM 200 MG/2ML IV SOLN
INTRAVENOUS | Status: AC
  Filled 2016-07-27: qty 2

## 2016-07-27 MED ORDER — MIDAZOLAM HCL 2 MG/2ML IJ SOLN
INTRAMUSCULAR | Status: AC
  Filled 2016-07-27: qty 2

## 2016-07-27 MED ORDER — DEXMEDETOMIDINE HCL 200 MCG/2ML IV SOLN
INTRAVENOUS | Status: DC | PRN
  Administered 2016-07-27: 12 ug via INTRAVENOUS

## 2016-07-27 MED ORDER — FENTANYL CITRATE (PF) 250 MCG/5ML IJ SOLN
INTRAMUSCULAR | Status: AC
  Filled 2016-07-27: qty 5

## 2016-07-27 MED ORDER — HYDROMORPHONE HCL 1 MG/ML IJ SOLN
1.0000 mg | INTRAMUSCULAR | Status: DC | PRN
  Administered 2016-07-29 – 2016-08-01 (×10): 1 mg via INTRAVENOUS
  Filled 2016-07-27 (×10): qty 1

## 2016-07-27 MED ORDER — 0.9 % SODIUM CHLORIDE (POUR BTL) OPTIME
TOPICAL | Status: DC | PRN
  Administered 2016-07-27 (×2): 1000 mL

## 2016-07-27 MED ORDER — ONDANSETRON 4 MG PO TBDP
4.0000 mg | ORAL_TABLET | Freq: Four times a day (QID) | ORAL | Status: DC | PRN
  Filled 2016-07-27: qty 1

## 2016-07-27 MED ORDER — IRBESARTAN 75 MG PO TABS
75.0000 mg | ORAL_TABLET | Freq: Every day | ORAL | Status: DC
  Administered 2016-07-27 – 2016-08-01 (×5): 75 mg via ORAL
  Filled 2016-07-27 (×8): qty 1

## 2016-07-27 MED ORDER — MEPERIDINE HCL 25 MG/ML IJ SOLN: 6.2500 mg | INTRAMUSCULAR | Status: DC | PRN

## 2016-07-27 MED ORDER — DEXMEDETOMIDINE HCL IN NACL 200 MCG/50ML IV SOLN
INTRAVENOUS | Status: AC
  Filled 2016-07-27: qty 50

## 2016-07-27 MED ORDER — KETAMINE HCL 10 MG/ML IJ SOLN
INTRAMUSCULAR | Status: DC | PRN
  Administered 2016-07-27: 50 mg via INTRAVENOUS
  Administered 2016-07-27: 30 mg via INTRAVENOUS
  Administered 2016-07-27: 20 mg via INTRAVENOUS

## 2016-07-27 MED ORDER — DEXAMETHASONE SODIUM PHOSPHATE 10 MG/ML IJ SOLN
INTRAMUSCULAR | Status: DC | PRN
  Administered 2016-07-27: 10 mg via INTRAVENOUS

## 2016-07-27 MED ORDER — HYDROMORPHONE 1 MG/ML IV SOLN
INTRAVENOUS | Status: DC
  Administered 2016-07-27: 3.9 mg via INTRAVENOUS
  Administered 2016-07-27: 25 mg via INTRAVENOUS
  Administered 2016-07-28: 2.1 mg via INTRAVENOUS
  Administered 2016-07-28: 0.6 mg via INTRAVENOUS
  Administered 2016-07-28: 1.2 mg via INTRAVENOUS
  Administered 2016-07-28: 0.3 mg via INTRAVENOUS

## 2016-07-27 MED ORDER — ALVIMOPAN 12 MG PO CAPS
12.0000 mg | ORAL_CAPSULE | Freq: Two times a day (BID) | ORAL | Status: DC
  Administered 2016-07-28 – 2016-07-29 (×3): 12 mg via ORAL
  Filled 2016-07-27 (×6): qty 1

## 2016-07-27 MED ORDER — LEVOTHYROXINE SODIUM 75 MCG PO TABS
75.0000 ug | ORAL_TABLET | Freq: Every day | ORAL | Status: DC
  Administered 2016-07-30 – 2016-08-01 (×3): 75 ug via ORAL
  Filled 2016-07-27 (×4): qty 1

## 2016-07-27 MED ORDER — HYDROMORPHONE HCL 1 MG/ML IJ SOLN
0.2500 mg | INTRAMUSCULAR | Status: DC | PRN
  Administered 2016-07-27 (×4): 0.25 mg via INTRAVENOUS
  Filled 2016-07-27: qty 0.5

## 2016-07-27 MED ORDER — CEFAZOLIN SODIUM-DEXTROSE 2-4 GM/100ML-% IV SOLN
2.0000 g | INTRAVENOUS | Status: AC
  Administered 2016-07-27: 2 g via INTRAVENOUS
  Filled 2016-07-27: qty 100

## 2016-07-27 MED ORDER — SUGAMMADEX SODIUM 200 MG/2ML IV SOLN
INTRAVENOUS | Status: DC | PRN
  Administered 2016-07-27: 200 mg via INTRAVENOUS

## 2016-07-27 MED ORDER — KETAMINE HCL-SODIUM CHLORIDE 100-0.9 MG/10ML-% IV SOSY
PREFILLED_SYRINGE | INTRAVENOUS | Status: AC
  Filled 2016-07-27: qty 10

## 2016-07-27 MED ORDER — DEXTROSE-NACL 5-0.9 % IV SOLN
INTRAVENOUS | Status: DC
  Administered 2016-07-27 – 2016-08-01 (×5): via INTRAVENOUS

## 2016-07-27 MED ORDER — SIMETHICONE 80 MG PO CHEW
40.0000 mg | CHEWABLE_TABLET | Freq: Four times a day (QID) | ORAL | Status: DC | PRN
  Administered 2016-07-27: 40 mg via ORAL
  Filled 2016-07-27: qty 1

## 2016-07-27 MED ORDER — ENOXAPARIN SODIUM 40 MG/0.4ML ~~LOC~~ SOLN
40.0000 mg | SUBCUTANEOUS | Status: DC
  Administered 2016-07-29 – 2016-08-01 (×4): 40 mg via SUBCUTANEOUS
  Filled 2016-07-27 (×4): qty 0.4

## 2016-07-27 MED ORDER — ONDANSETRON HCL 4 MG/2ML IJ SOLN
4.0000 mg | Freq: Four times a day (QID) | INTRAMUSCULAR | Status: DC | PRN
  Filled 2016-07-27: qty 2

## 2016-07-27 MED ORDER — DIPHENHYDRAMINE HCL 50 MG/ML IJ SOLN
INTRAMUSCULAR | Status: DC | PRN
  Administered 2016-07-27: 25 mg via INTRAVENOUS

## 2016-07-27 MED ORDER — ONDANSETRON HCL 4 MG/2ML IJ SOLN
4.0000 mg | Freq: Four times a day (QID) | INTRAMUSCULAR | Status: DC | PRN
  Administered 2016-07-27: 4 mg via INTRAVENOUS

## 2016-07-27 MED ORDER — LACTATED RINGERS IV SOLN
INTRAVENOUS | Status: DC | PRN
  Administered 2016-07-27 (×2): via INTRAVENOUS
  Administered 2016-07-27: 75 mL/h

## 2016-07-27 MED ORDER — GABAPENTIN 300 MG PO CAPS
900.0000 mg | ORAL_CAPSULE | Freq: Every day | ORAL | Status: DC
  Administered 2016-07-28 – 2016-07-31 (×4): 900 mg via ORAL
  Filled 2016-07-27 (×4): qty 3

## 2016-07-27 MED ORDER — DIPHENHYDRAMINE HCL 50 MG/ML IJ SOLN
12.5000 mg | Freq: Four times a day (QID) | INTRAMUSCULAR | Status: DC | PRN
  Filled 2016-07-27: qty 1

## 2016-07-27 MED ORDER — NALOXONE HCL 0.4 MG/ML IJ SOLN
0.4000 mg | INTRAMUSCULAR | Status: DC | PRN
  Filled 2016-07-27: qty 1

## 2016-07-27 MED ORDER — PROPOFOL 10 MG/ML IV BOLUS
INTRAVENOUS | Status: DC | PRN
  Administered 2016-07-27: 140 mg via INTRAVENOUS

## 2016-07-27 MED ORDER — METHOCARBAMOL 500 MG PO TABS
500.0000 mg | ORAL_TABLET | Freq: Four times a day (QID) | ORAL | Status: DC | PRN
  Administered 2016-07-27 – 2016-07-31 (×4): 500 mg via ORAL
  Filled 2016-07-27 (×4): qty 1

## 2016-07-27 SURGICAL SUPPLY — 62 items
BINDER ABDOMINAL 12 ML 46-62 (SOFTGOODS) ×1 IMPLANT
BIOPATCH RED 1 DISK 7.0 (GAUZE/BANDAGES/DRESSINGS) ×1 IMPLANT
BLADE CLIPPER SURG (BLADE) ×1 IMPLANT
BLADE SURG 11 STRL SS (BLADE) ×1 IMPLANT
CANISTER SUCT 3000ML PPV (MISCELLANEOUS) ×2 IMPLANT
CHLORAPREP W/TINT 26ML (MISCELLANEOUS) ×2 IMPLANT
COVER SURGICAL LIGHT HANDLE (MISCELLANEOUS) ×2 IMPLANT
DEVICE TROCAR PUNCTURE CLOSURE (ENDOMECHANICALS) ×1 IMPLANT
DRAIN CHANNEL 19F RND (DRAIN) ×1 IMPLANT
DRAPE INCISE IOBAN 66X45 STRL (DRAPES) ×1 IMPLANT
DRAPE LAPAROSCOPIC ABDOMINAL (DRAPES) ×2 IMPLANT
DRAPE WARM FLUID 44X44 (DRAPE) ×2 IMPLANT
DRSG OPSITE POSTOP 4X10 (GAUZE/BANDAGES/DRESSINGS) ×1 IMPLANT
DRSG OPSITE POSTOP 4X8 (GAUZE/BANDAGES/DRESSINGS) ×1 IMPLANT
DRSG TEGADERM 2-3/8X2-3/4 SM (GAUZE/BANDAGES/DRESSINGS) ×1 IMPLANT
ELECT BLADE 4.0 EZ CLEAN MEGAD (MISCELLANEOUS) ×2
ELECT BLADE 6.5 EXT (BLADE) IMPLANT
ELECT CAUTERY BLADE 6.4 (BLADE) ×2 IMPLANT
ELECT REM PT RETURN 9FT ADLT (ELECTROSURGICAL) ×2
ELECTRODE BLDE 4.0 EZ CLN MEGD (MISCELLANEOUS) IMPLANT
ELECTRODE REM PT RTRN 9FT ADLT (ELECTROSURGICAL) ×1 IMPLANT
EVACUATOR SILICONE 100CC (DRAIN) ×1 IMPLANT
GAUZE SPONGE 4X4 12PLY STRL (GAUZE/BANDAGES/DRESSINGS) IMPLANT
GLOVE BIO SURGEON STRL SZ7.5 (GLOVE) ×2 IMPLANT
GLOVE BIOGEL PI IND STRL 8 (GLOVE) ×1 IMPLANT
GLOVE BIOGEL PI INDICATOR 8 (GLOVE) ×2
GLOVE EUDERMIC 7 POWDERFREE (GLOVE) ×1 IMPLANT
GOWN STRL REUS W/ TWL LRG LVL3 (GOWN DISPOSABLE) ×1 IMPLANT
GOWN STRL REUS W/ TWL XL LVL3 (GOWN DISPOSABLE) ×1 IMPLANT
GOWN STRL REUS W/TWL LRG LVL3 (GOWN DISPOSABLE) ×2
GOWN STRL REUS W/TWL XL LVL3 (GOWN DISPOSABLE) ×4
KIT BASIN OR (CUSTOM PROCEDURE TRAY) ×2 IMPLANT
KIT ROOM TURNOVER OR (KITS) ×2 IMPLANT
LIGASURE IMPACT 36 18CM CVD LR (INSTRUMENTS) IMPLANT
MARKER SKIN DUAL TIP RULER LAB (MISCELLANEOUS) ×1 IMPLANT
MESH PROLENE PML 12X12 (Mesh General) ×1 IMPLANT
NS IRRIG 1000ML POUR BTL (IV SOLUTION) ×4 IMPLANT
PACK GENERAL/GYN (CUSTOM PROCEDURE TRAY) ×2 IMPLANT
PAD ARMBOARD 7.5X6 YLW CONV (MISCELLANEOUS) ×4 IMPLANT
SPONGE LAP 18X18 X RAY DECT (DISPOSABLE) ×1 IMPLANT
STAPLER VISISTAT 35W (STAPLE) ×2 IMPLANT
STRIP CLOSURE SKIN 1/2X4 (GAUZE/BANDAGES/DRESSINGS) ×1 IMPLANT
SUCTION POOLE TIP (SUCTIONS) ×2 IMPLANT
SUT ETHILON 2 0 FS 18 (SUTURE) ×1 IMPLANT
SUT ETHILON 3 0 FSL (SUTURE) IMPLANT
SUT NOVA NAB GS-21 0 18 T12 DT (SUTURE) ×3 IMPLANT
SUT PDS AB 0 CT 36 (SUTURE) IMPLANT
SUT PDS AB 1 TP1 54 (SUTURE) ×4 IMPLANT
SUT PDS AB 1 TP1 96 (SUTURE) ×4 IMPLANT
SUT SILK 2 0 SH CR/8 (SUTURE) ×2 IMPLANT
SUT SILK 2 0 TIES 10X30 (SUTURE) ×2 IMPLANT
SUT SILK 3 0 SH CR/8 (SUTURE) ×2 IMPLANT
SUT SILK 3 0 TIES 10X30 (SUTURE) ×2 IMPLANT
SUT VIC AB 2-0 CT1 18 (SUTURE) ×1 IMPLANT
SUT VIC AB 2-0 SH 27 (SUTURE) ×4
SUT VIC AB 2-0 SH 27XBRD (SUTURE) IMPLANT
SUT VICRYL AB 2 0 TIES (SUTURE) ×2 IMPLANT
TOWEL OR 17X24 6PK STRL BLUE (TOWEL DISPOSABLE) ×2 IMPLANT
TOWEL OR 17X26 10 PK STRL BLUE (TOWEL DISPOSABLE) ×2 IMPLANT
TRAY FOLEY CATH SILVER 16FR (SET/KITS/TRAYS/PACK) ×1 IMPLANT
TRAY FOLEY W/METER SILVER 16FR (SET/KITS/TRAYS/PACK) ×2 IMPLANT
YANKAUER SUCT BULB TIP NO VENT (SUCTIONS) IMPLANT

## 2016-07-27 NOTE — Anesthesia Procedure Notes (Signed)
Procedure Name: Intubation Date/Time: 07/27/2016 7:45 AM Performed by: Mariea Clonts Pre-anesthesia Checklist: Patient identified, Emergency Drugs available, Suction available and Patient being monitored Patient Re-evaluated:Patient Re-evaluated prior to inductionOxygen Delivery Method: Circle System Utilized Preoxygenation: Pre-oxygenation with 100% oxygen Intubation Type: IV induction Ventilation: Mask ventilation without difficulty Laryngoscope Size: Miller and 3 Grade View: Grade I Tube type: Oral Tube size: 8.0 mm Number of attempts: 1 Airway Equipment and Method: Stylet and Oral airway Placement Confirmation: ETT inserted through vocal cords under direct vision,  positive ETCO2 and breath sounds checked- equal and bilateral Tube secured with: Tape Dental Injury: Teeth and Oropharynx as per pre-operative assessment

## 2016-07-27 NOTE — Progress Notes (Signed)
Pt. Took Gabapentin this a.m., will omit the dose that is ordered by Dr. Rosendo Gros for this a.m.  T&S drawn this a.m. per as needed. Pt. Reports his breathing is normal, VSS, pt. Oriented & alert but reports that he is tired, CBG- wnl.  Xrays done this a.m. related to fall that pt. Endured Sat. P.m.- wnl- per Dr. Lissa Hoard.

## 2016-07-27 NOTE — Discharge Instructions (Signed)
[facility] _______[facility] ° °HERNIA REPAIR: POST OP INSTRUCTIONS ° °Always review your discharge instruction sheet given to you by the facility where your surgery was performed. °IF YOU HAVE DISABILITY OR FAMILY LEAVE FORMS, YOU MUST BRING THEM TO THE OFFICE FOR PROCESSING.   °DO NOT GIVE THEM TO YOUR DOCTOR. ° °1. A  prescription for pain medication may be given to you upon discharge.  Take your pain medication as prescribed, if needed.  If narcotic pain medicine is not needed, then you may take acetaminophen (Tylenol) or ibuprofen (Advil) as needed. °2. Take your usually prescribed medications unless otherwise directed. °If you need a refill on your pain medication, please contact your pharmacy.  They will contact our office to request authorization. Prescriptions will not be filled after 5 pm or on week-ends. °3. You should follow a light diet the first 24 hours after arrival home, such as soup and crackers, etc.  Be sure to include lots of fluids daily.  Resume your normal diet the day after surgery. °4.Most patients will experience some swelling and bruising around the umbilicus or in the groin and scrotum.  Ice packs and reclining will help.  Swelling and bruising can take several days to resolve.  °6. It is common to experience some constipation if taking pain medication after surgery.  Increasing fluid intake and taking a stool softener (such as Colace) will usually help or prevent this problem from occurring.  A mild laxative (Milk of Magnesia or Miralax) should be taken according to package directions if there are no bowel movements after 48 hours. °7. Unless discharge instructions indicate otherwise, you may remove your bandages 24-48 hours after surgery, and you may shower at that time.  You may have steri-strips (small skin tapes) in place directly over the incision.  These strips should be left on the skin for 7-10 days.  If your surgeon used skin glue on the incision, you may shower in  24 hours.  The glue will flake off over the next 2-3 weeks.  Any sutures or staples will be removed at the office during your follow-up visit. °8. ACTIVITIES:  You may resume regular (light) daily activities beginning the next day--such as daily self-care, walking, climbing stairs--gradually increasing activities as tolerated.  You may have sexual intercourse when it is comfortable.  Refrain from any heavy lifting or straining until approved by your doctor. ° °a.You may drive when you are no longer taking prescription pain medication, you can comfortably wear a seatbelt, and you can safely maneuver your car and apply brakes. °b.RETURN TO WORK:   °_____________________________________________ ° °9.You should see your doctor in the office for a follow-up appointment approximately 2-3 weeks after your surgery.  Make sure that you call for this appointment within a day or two after you arrive home to insure a convenient appointment time. °10.OTHER INSTRUCTIONS: _________________________ °   _____________________________________ ° °WHEN TO CALL YOUR DOCTOR: °1. Fever over 101.0 °2. Inability to urinate °3. Nausea and/or vomiting °4. Extreme swelling or bruising °5. Continued bleeding from incision. °6. Increased pain, redness, or drainage from the incision ° °The clinic staff is available to answer your questions during regular business hours.  Please don’t hesitate to call and ask to speak to one of the nurses for clinical concerns.  If you have a medical emergency, go to the nearest emergency room or call 911.  A surgeon from [facility] is always on call at the hospital ° ° °[address], [address], [place], [place]  [address] ? ° [address] [address], [place],    [address] °[phone] ? [phone] ? [phone] [phone] °Web site: [email or web address] ° °

## 2016-07-27 NOTE — Progress Notes (Signed)
Dr. Rosendo Gros notified of elevated BP's since arrival from PACU with scheduled Irbesartan pending delivery from pharmacy.  Verbal order received for IV Hydralazine q6 hrs PRN for SBP>160.  Read back and reviewed prior to order placement.  Also, per MD, pt to stay NPO with exception of H20 sips with meds for remainder of day.  Pt and family updated.  Will continue to monitor.

## 2016-07-27 NOTE — Progress Notes (Signed)
Arrived to unit from PACU in stable condition.  Lethargic upon arrival yet arousable. Alert to person/place.  Family present at bedside.  Reviewed POC with family (pt resting comfortably during that time).  Oriented to room and unit.  Tele placed; CCMD called and notified via 2 RN verification.  Transfer orders released and reviewed.  Will continue to monitor.

## 2016-07-27 NOTE — Transfer of Care (Signed)
Immediate Anesthesia Transfer of Care Note  Patient: [name] [name]  Procedure(s) Performed: Procedure(s): EXPLORATORY LAPAROTOMY (N/A) LYSIS OF ADHESION (N/A) HERNIA REPAIR INCISIONAL (N/A) INSERTION OF MESH (N/A)  Patient Location: PACU  Anesthesia Type:General  Level of Consciousness: awake, alert  and oriented  Airway & Oxygen Therapy: Patient Spontanous Breathing and Patient connected to face mask oxygen  Post-op Assessment: Report given to RN and Post -op Vital signs reviewed and stable  Post vital signs: Reviewed and stable  Last Vitals:  Vitals:   07/27/16 0637 07/27/16 0956  BP: 139/69   Pulse: (!) 50   Resp: 20   Temp: 36.8 C 36.6 C    Last Pain: There were no vitals filed for this visit.       Complications: No apparent anesthesia complications

## 2016-07-27 NOTE — Op Note (Signed)
07/27/2016  9:42 AM  PATIENT:  [name] [name]  71 y.o. male  PRE-OPERATIVE DIAGNOSIS:  incisional hernia x 2  POST-OPERATIVE DIAGNOSIS:  incisional hernia x 2  PROCEDURE:  Procedure(s): EXPLORATORY LAPAROTOMY (N/A) BILATERAL MUSCULOCUTANEOUS FLAP CREATION AND HERNIA REPAIR WITH MESH  SURGEON:  Surgeon(s) and Role:    * Ralene Ok, MD - Primary    Fanny Skates, MD - Assisting  ANESTHESIA:   local and general  EBL: 100  Total I/O In: 1000 [I.V.:1000] Out: 450 [Urine:450]  BLOOD ADMINISTERED:none  DRAINS: (38fR) Jackson-Pratt drain(s) with closed bulb suction in the retrorectus space   LOCAL MEDICATIONS USED:  NONE  SPECIMEN:  No Specimen  DISPOSITION OF SPECIMEN:  N/A  COUNTS:  YES  TOURNIQUET:  * No tourniquets in log *  DICTATION: .Dragon Dictation After the patient was consented he was taken back to the operating room placed supine position bilateral SCDs in place. After appropriate antibiotics were confirmed timeout was called and all facts verified.  The superficial scar was dissected off the anterior abdominal wall. This was discarded. Cautery was used to maintain hemostasis and dissection was taken down to the anterior fascia midline. This was incised. The fascia was elevated up. The hernia sac was then bluntly entered. At this time the skin and fascia the incision extended superiorly and inferiorly. There were minimal adhesions to the hernia sac in the midline. This allowed [place] to extend the incision inferiorly to the extent of the hernia as well as superiorly.  At this time proceeded to lyse adhesions of the omentum to the anterior abdominal wall. There were minimal adhesions and no major adhesiolysis.  Once this was done the rectus sheath retracted medially. The inferior portion of the rectus tissues were then incised with electrocautery. The muscle was adherent to the posterior rectus sheath, However I was able to dissect off the posterior rectus sheath and  its entirety to the left side of the abdomen.  There ostomy hernia was seen as well. This was done to the extent of the hernia. At this time a proximal 1 cm medial to the perforating vessels the transversus abdominis muscle was incised as was its fascia. We carried this dissection both inferiorly and superiorly.  This was able to be carried around the hernia. At this time the transversus abdominis muscle was then dissected laterally in a blunt fashion. A right angle was used to initially started the dissection with electrocautery. This easily dissected laterally. This allowed advancement of the peritoneum medially. The ostomy site hole was then reapproximated using a 2-0 Vicryl in a running fashion and reapproximated it well.  At this time proceeded to retract the left rectus sheath medially and the inferior portion of the rectus sheath was incised. There were minimal muscular adhesions in this area. This easily dissected away. The retrorectus space was taken superiorly and inferiorly and allowed [place] to medialize the fascia to the midline.  The midline approxmation of the fascia was check and was with undue tension.  The superior and inferior portions of the dissection were connected in this plane.  Inferiorly down to the pubic tubercle and Coopers ligament.  Superiorly this was extended to the retrosternal fat pad.This was carried approximately to 6 cm superior & inferiorly past the edge of the hernia.  At this time #1 PDS suture was used in a running standard fashion reapproximate the peritoneum in the midline.  There was no tension on the midline peritoneal closure.  At this time a  piece of 30 x 30 cm Prolene mesh was then trimmed and placed in the preperitoneal space. The Novafil sutures were used to tack the mesh. Fascial using Endo Close device 3 laterally. This was also done superior and inferior portion of the hernia 3 and x2,  respectively. The mesh lay flat and the tension was called laterally on  each side. At this time as it was used to approximate the mesh to the peritoneum.  At this time the 53 [place] Blake drains were placed over the mesh via stab incisions. This was brought out inferiorly in the midclavicular lines. These were secured using 2-0 nylon as interrupted fashion.  At this time the rectus fascia was reapproximated midline using #1 PDS in a standard running fashion. Again there was no tension on the fascia in the midline.  There subcutaneous layer was reapproximated in the midline with interupted 2-0 vicryls in a figure of 8 fashion.  The skin was reapproximated skin staples. The bilateral stab wounds were then dressed with Steri-Strips the midline skin was dressed with Steri-Strips gauze and tape. The bulb were charged on the JP drains. An abdominal binder was placed as the patient left the operating room.  Patient was awakened from general anesthesia and taken to recovery room stable condition.    PLAN OF CARE: Admit to inpatient   PATIENT DISPOSITION:  PACU - hemodynamically stable.   Delay start of Pharmacological VTE agent (>24hrs) due to surgical blood loss or risk of bleeding: no

## 2016-07-27 NOTE — H&P (Signed)
History of Present Illness  The patient is a 71 year old male who presents with an incisional hernia. Patient is a 71 year old male who comes back in after her CT scan as well as laboratory studies. Patient states she's had continued ongoing pain from his hernia. Patient has a CT scan which reveals a large midline hernia as well as a left previous ostomy site hernia to the rectus.  Patient was recently seen by cardiology, Dr. Percival Spanish in and cleared. Patient comes back in today to proceed with scheduling surgery. ----------------------------------- Patient is a 70 year old male who has undergone multiple abdominal operations. Patient initially underwent exploratory laparotomy with colectomy and colostomy in March 2017. This was by Dr. Grandville Silos. Patient subsequently went back in September 2017 for a colostomy takedown. Patient states that approximately 1 month after he noticed a increasing bulge in his midline incision area. He states that he is had some discomfort in this area. He does feel that it is getting larger. He has had no signs or symptoms of incarceration or strangulation.  The patient takes chronic pain medication and has a pain contract. He takes oxycodone.  Patient has had a previous bypass. He does not see a cardiologist at this time. He has been seen in the past by Dr. Katharina Caper while hospitalized for pneumonia.   Allergies Ciprofloxacin *CHEMICALS*  Confusion FentaNYL *ANALGESICS - OPIOID*  Myalgia Allergies Reconciled   Medication History  Allopurinol (300MG  Tablet, Oral) Active. Morphine Sulfate ER (15MG  Tablet ER, Oral) Active. MetFORMIN HCl ER (500MG  Tablet ER 24HR, Oral) Active. Aspirin (325MG  Tablet, Oral) Active. B Complex Plus Vitamin C (Oral) Active. Calcium 500/D (500-200MG -UNIT Tablet, Oral) Active. Valsartan (80MG  Tablet, Oral) Active. Metoprolol Tartrate (100MG  Tablet, Oral) Active. Potassium Gluconate (595 (99 K)MG Tablet, Oral)  Active. Nitrostat (0.4MG  Tab Sublingual, Sublingual) Active. Magnesium Oxide (250MG  Tablet, Oral) Active. ClonazePAM (1MG  Tablet, Oral) Active. Fluticasone Propionate (50MCG/ACT Suspension, Nasal) Active. Omeprazole (40MG  Capsule DR, Oral) Active. Venlafaxine HCl ER (75MG  Capsule ER 24HR, Oral) Active. Gabapentin (300MG  Capsule, Oral) Active. Hydrocodone-Acetaminophen (10-325MG  Tablet, Oral as needed) Active. Levothyroxine Sodium (75MCG Tablet, Oral) Active. Medications Reconciled  BP 139/69   Pulse (!) 50   Temp 98.3 F (36.8 C)   Resp 20   SpO2 94%     Physical ExamGeneral Mental Status-Alert. General Appearance-Consistent with stated age. Hydration-Well hydrated. Voice-Normal.  Head and Neck Head-normocephalic, atraumatic with no lesions or palpable masses. Trachea-midline. Thyroid Gland Characteristics - normal size and consistency.  Chest and Lung Exam Chest and lung exam reveals -quiet, even and easy respiratory effort with no use of accessory muscles and on auscultation, normal breath sounds, no adventitious sounds and normal vocal resonance. Inspection Chest Wall - Normal. Back - normal.  Cardiovascular Cardiovascular examination reveals -normal heart sounds, regular rate and rhythm with no murmurs and normal pedal pulses bilaterally.  Abdomen Inspection Skin - Scar - no surgical scars. Hernias - Incisional hernia - Reducible(Approximately 10-11 cm wide). Palpation/Percussion Normal exam - Soft, Non Tender, No Rebound tenderness, No Rigidity (guarding) and No hepatosplenomegaly. Auscultation Normal exam - Bowel sounds normal.    Assessment & Plan INCISIONAL HERNIA, WITHOUT OBSTRUCTION OR GANGRENE (K43.2) Impression: 71 year old male with large incisional hernia. 1. The patient like to proceed to the OR for an open abdominal wall reconstruction, lysis of adhesions. 2. Discussed with him the risks and benefits of the procedure to  include but not limited to: Infection, bleeding, damage to structures, possible recurrence. Patient was understanding and wished to proceed.  DIABETES MELLITUS  TYPE 2 IN OBESE (E11.69)

## 2016-07-27 NOTE — Anesthesia Postprocedure Evaluation (Signed)
Anesthesia Post Note  Patient: [name] [name]  Procedure(s) Performed: Procedure(s) (LRB): EXPLORATORY LAPAROTOMY (N/A) LYSIS OF ADHESION (N/A) HERNIA REPAIR INCISIONAL (N/A) INSERTION OF MESH (N/A)     Patient location during evaluation: PACU Anesthesia Type: General Level of consciousness: sedated and patient cooperative Pain management: pain level controlled Vital Signs Assessment: post-procedure vital signs reviewed and stable Respiratory status: spontaneous breathing Cardiovascular status: stable Anesthetic complications: no    Last Vitals:  Vitals:   07/27/16 1321 07/27/16 1452  BP: (!) 187/98 (!) 189/108  Pulse:    Resp:    Temp:      Last Pain:  Vitals:   07/27/16 1200  PainSc: 10-Worst pain ever                 Nolon Nations

## 2016-07-28 ENCOUNTER — Encounter (HOSPITAL_COMMUNITY): Payer: Self-pay

## 2016-07-28 LAB — GLUCOSE, CAPILLARY
GLUCOSE-CAPILLARY: 211 mg/dL — AB (ref 65–99)
Glucose-Capillary: 156 mg/dL — ABNORMAL HIGH (ref 65–99)
Glucose-Capillary: 157 mg/dL — ABNORMAL HIGH (ref 65–99)

## 2016-07-28 LAB — BASIC METABOLIC PANEL
Anion gap: 12 (ref 5–15)
BUN: 10 mg/dL (ref 6–20)
CALCIUM: 8.8 mg/dL — AB (ref 8.9–10.3)
CHLORIDE: 96 mmol/L — AB (ref 101–111)
CO2: 26 mmol/L (ref 22–32)
CREATININE: 1.04 mg/dL (ref 0.61–1.24)
GFR calc Af Amer: >60 mL/min (ref >60)
GFR calc non Af Amer: >60 mL/min (ref >60)
Glucose, Bld: 213 mg/dL — ABNORMAL HIGH (ref 65–99)
Potassium: 3.6 mmol/L (ref 3.5–5.1)
Sodium: 134 mmol/L — ABNORMAL LOW (ref 135–145)

## 2016-07-28 LAB — CBC
HCT: 37.8 % — ABNORMAL LOW (ref 39.0–52.0)
HEMOGLOBIN: 11.6 g/dL — AB (ref 13.0–17.0)
MCH: 24.8 pg — AB (ref 26.0–34.0)
MCHC: 30.7 g/dL (ref 30.0–36.0)
MCV: 80.8 fL (ref 78.0–100.0)
Platelets: 364 10*3/uL (ref 150–400)
RBC: 4.68 MIL/uL (ref 4.22–5.81)
RDW: 16.2 % — AB (ref 11.5–15.5)
WBC: 22.6 10*3/uL — ABNORMAL HIGH (ref 4.0–10.5)

## 2016-07-28 MED ORDER — HYDROMORPHONE HCL 1 MG/ML IJ SOLN
1.0000 mg | INTRAMUSCULAR | Status: DC | PRN
  Administered 2016-07-28 (×2): 2 mg via INTRAVENOUS
  Administered 2016-07-29: 1 mg via INTRAVENOUS
  Administered 2016-07-29 – 2016-07-30 (×3): 2 mg via INTRAVENOUS
  Administered 2016-07-30 (×2): 1 mg via INTRAVENOUS
  Administered 2016-07-30 – 2016-08-01 (×4): 2 mg via INTRAVENOUS
  Filled 2016-07-28: qty 1
  Filled 2016-07-28 (×5): qty 2
  Filled 2016-07-28: qty 1
  Filled 2016-07-28 (×3): qty 2
  Filled 2016-07-28: qty 1
  Filled 2016-07-28 (×2): qty 2

## 2016-07-28 MED ORDER — OXYCODONE HCL 5 MG PO TABS: 10.0000 mg | ORAL_TABLET | ORAL | Status: DC | PRN

## 2016-07-28 MED ORDER — LORAZEPAM 2 MG/ML IJ SOLN
1.0000 mg | INTRAMUSCULAR | Status: DC | PRN
  Administered 2016-07-28: 0.5 mg via INTRAVENOUS
  Administered 2016-07-29: 1 mg via INTRAVENOUS
  Filled 2016-07-28 (×2): qty 1

## 2016-07-28 MED ORDER — PNEUMOCOCCAL VAC POLYVALENT 25 MCG/0.5ML IJ INJ
0.5000 mL | INJECTION | INTRAMUSCULAR | Status: AC
  Administered 2016-07-29: 0.5 mL via INTRAMUSCULAR
  Filled 2016-07-28: qty 0.5

## 2016-07-28 MED ORDER — ORAL CARE MOUTH RINSE
15.0000 mL | Freq: Two times a day (BID) | OROMUCOSAL | Status: DC
  Administered 2016-07-28 – 2016-08-01 (×7): 15 mL via OROMUCOSAL

## 2016-07-28 NOTE — Progress Notes (Signed)
Patient has had increasing confusion, asking for a wheelchair, insisting that he is at home, and calling out for "[employer]" and "Larkin Ina".  Attempted to reorient patient multiple times, but told to "get out of here" with every attempt. He answers questions appropriately but calls out in confusion and conversation is not fluid. He appears more agitated by my presence than when he is alone.  Will continue to monitor.

## 2016-07-28 NOTE — Care Management Note (Signed)
Case Management Note  Patient Details  Name: SHIGERU LAMPERT MRN: [id number] Date of Birth: 08-16-1945  Subjective/Objective:  S/p exp lap, lysis of adhesion, hernia repair, advance diet to cld, dc pca,   PCP  Merrilee Seashore                 Action/Plan: NCM will follow for dc needs.  Expected Discharge Date:                  Expected Discharge Plan:     In-House Referral:     Discharge planning Services  CM Consult  Post Acute Care Choice:    Choice offered to:     DME Arranged:    DME Agency:     HH Arranged:    HH Agency:     Status of Service:  In process, will continue to follow  If discussed at Long Length of Stay Meetings, dates discussed:    Additional Comments:  Zenon Mayo, RN 07/28/2016, 6:00 PM

## 2016-07-28 NOTE — Progress Notes (Signed)
Patient is extremely agitated, combative, and will not follow commands. Patient refused BG stick this AM and will not take PO medication. Paged to get prescription for restraints or Ativan. Will continue to monitor.

## 2016-07-28 NOTE — Progress Notes (Signed)
Wasted 57mL Dilaudid PCA in sink, witnessed by Phineas Douglas, RN

## 2016-07-28 NOTE — Progress Notes (Signed)
1 Day Post-Op   Subjective/Chief Complaint: Pt with some confusion and combatiness as expected per his wife. Pain appears to be controlled with PCA.   Objective: Vital signs in last 24 hours: Temp:  [97.8 F (36.6 C)-99.5 F (37.5 C)] 99.5 F (37.5 C) (06/12 1205) Pulse Rate:  [94-120] 94 (06/12 1205) Resp:  [15-27] 26 (06/12 1205) BP: (125-189)/(84-108) 153/84 (06/12 1205) SpO2:  [94 %-98 %] 94 % (06/12 1205) Weight:  [106.6 kg (235 lb 0.2 oz)] 106.6 kg (235 lb 0.2 oz) (06/11 2000) Last BM Date:  (PTA)  Intake/Output from previous day: 06/11 0701 - 06/12 0700 In: 2915 [P.O.:40; I.V.:2875] Out: 2892 [Urine:2525; Drains:267; Blood:100] Intake/Output this shift: Total I/O In: 60 [P.O.:60] Out: 500 [Urine:500]  General appearance: alert Resp: clear to auscultation bilaterally Cardio: tachy ,RR GI: soft, non-tender; bowel sounds normal; no masses,  no organomegaly  Incision c/d/i, JP SS  Lab Results:   Recent Labs  07/28/16 0248  WBC 22.6*  HGB 11.6*  HCT 37.8*  PLT 364   BMET  Recent Labs  07/28/16 0248  NA 134*  K 3.6  CL 96*  CO2 26  GLUCOSE 213*  BUN 10  CREATININE 1.04  CALCIUM 8.8*  Studies/Results: Dg Chest Port 1 View  Result Date: 07/27/2016 CLINICAL DATA:  Preoperative chest radiograph for abdominal surgery. Initial encounter. EXAM: PORTABLE CHEST 1 VIEW COMPARISON:  Chest radiograph performed 11/09/2015 FINDINGS: There is mild elevation of the left hemidiaphragm, with left basilar atelectasis. No pleural effusion or pneumothorax is seen. The cardiomediastinal silhouette is borderline normal in size. The patient is status post median sternotomy. Multiple chronically fractured sternal wires are noted. No acute osseous abnormalities are seen. IMPRESSION: Mild elevation of the left hemidiaphragm, with left basilar atelectasis. Electronically Signed   By: Garald Balding M.D.   On: 07/27/2016 06:40   Dg Hand Complete Right  Result Date:  07/27/2016 CLINICAL DATA:  Status post fall, with pain and bruising about the anterior right wrist. Initial encounter. EXAM: RIGHT HAND - COMPLETE 3+ VIEW COMPARISON:  None. FINDINGS: There is no evidence of fracture or dislocation. Degenerative change is noted at the first carpometacarpal joint, with joint space loss and osteophyte formation. Mild degenerative change is noted at the first and second metacarpophalangeal joints, with mild subluxation. The carpal rows are intact, and demonstrate normal alignment. Soft tissue swelling is noted about the wrist. IMPRESSION: 1. No evidence of fracture or dislocation. 2. Degenerative change at the first carpometacarpal joint, and at the first and second metacarpophalangeal joints, possibly reflecting osteoarthritis. Electronically Signed   By: Garald Balding M.D.   On: 07/27/2016 06:52  Assessment/Plan: s/p Procedure(s): EXPLORATORY LAPAROTOMY (N/A) LYSIS OF ADHESION (N/A) HERNIA REPAIR INCISIONAL (N/A) INSERTION OF MESH (N/A) Advance diet to CLD DC PCA Mobilize as tol Ativan for anxiety/confusion  LOS: 1 day    Rosario Jacks., Anne Hahn 07/28/2016

## 2016-07-28 NOTE — Progress Notes (Addendum)
Patient c/o nausea. While RN was getting antiemetic, he had one account of brown emesis. Then patient began c/o chest pressure and stated "I feel like I'm having a heart attack".  Upon questioning, patient states "I've had one before". 12 lead EKG obtained.  To do so, RN released abdominal binder, patient said that made symptoms go away.  Abdominal binder loosened for patient comfort. BP 170/95 - MD notified of these events, ok with this for now. Will continue to monitor.

## 2016-07-29 ENCOUNTER — Encounter (HOSPITAL_COMMUNITY): Payer: Self-pay | Admitting: Emergency Medicine

## 2016-07-29 LAB — GLUCOSE, CAPILLARY
GLUCOSE-CAPILLARY: 199 mg/dL — AB (ref 65–99)
GLUCOSE-CAPILLARY: 124 mg/dL — AB (ref 65–99)
Glucose-Capillary: 140 mg/dL — ABNORMAL HIGH (ref 65–99)
Glucose-Capillary: 144 mg/dL — ABNORMAL HIGH (ref 65–99)

## 2016-07-29 LAB — CBC
HCT: 32.5 % — ABNORMAL LOW (ref 39.0–52.0)
HEMOGLOBIN: 9.8 g/dL — AB (ref 13.0–17.0)
MCH: 24.7 pg — AB (ref 26.0–34.0)
MCHC: 30.2 g/dL (ref 30.0–36.0)
MCV: 81.9 fL (ref 78.0–100.0)
Platelets: 341 10*3/uL (ref 150–400)
RBC: 3.97 MIL/uL — AB (ref 4.22–5.81)
RDW: 16.4 % — ABNORMAL HIGH (ref 11.5–15.5)
WBC: 24.4 10*3/uL — ABNORMAL HIGH (ref 4.0–10.5)

## 2016-07-29 MED ORDER — CHLORHEXIDINE GLUCONATE 0.12 % MT SOLN
15.0000 mL | Freq: Two times a day (BID) | OROMUCOSAL | Status: DC
  Administered 2016-07-30 – 2016-08-01 (×5): 15 mL via OROMUCOSAL
  Filled 2016-07-29 (×5): qty 15

## 2016-07-29 MED ORDER — CHLORHEXIDINE GLUCONATE 0.12 % MT SOLN
OROMUCOSAL | Status: AC
  Administered 2016-07-29: 15 mL
  Filled 2016-07-29: qty 15

## 2016-07-29 NOTE — Progress Notes (Signed)
Pts. wife called for update- said delirium and agitation lasted for approximately 6 days following last surgery. Will continue to monitor.

## 2016-07-29 NOTE — Progress Notes (Signed)
2 Days Post-Op   Subjective/Chief Complaint: Pt less combative last night as per RN Still c/o abdominal pain   Objective: Vital signs in last 24 hours: Temp:  [98.2 F (36.8 C)-101.2 F (38.4 C)] 100.3 F (37.9 C) (06/13 0200) Pulse Rate:  [94-133] 102 (06/13 0600) Resp:  [19-27] 19 (06/13 0600) BP: (129-171)/(77-105) 157/98 (06/13 0600) SpO2:  [91 %-96 %] 92 % (06/13 0600) Last BM Date:  (pta)  Intake/Output from previous day: 06/12 0701 - 06/13 0700 In: 847.5 [P.O.:60; I.V.:757.5] Out: 1300 [Urine:1200; Drains:100] Intake/Output this shift: No intake/output data recorded.  General appearance: alert and cooperative GI: soft, approp ttp, incision c/d/i, JP SS  Lab Results:   Recent Labs  07/28/16 0248 07/29/16 0337  WBC 22.6* 24.4*  HGB 11.6* 9.8*  HCT 37.8* 32.5*  PLT 364 341   BMET  Recent Labs  07/28/16 0248  NA 134*  K 3.6  CL 96*  CO2 26  GLUCOSE 213*  BUN 10  CREATININE 1.04  CALCIUM 8.8*   Assessment/Plan: s/p Procedure(s): EXPLORATORY LAPAROTOMY (N/A) LYSIS OF ADHESION (N/A) HERNIA REPAIR INCISIONAL (N/A) INSERTION OF MESH (N/A) -con't med tx -con't diet as tol -mobilize as tol to chair -IS    LOS: 2 days    [name] [name]., Anne Hahn 07/29/2016

## 2016-07-30 LAB — GLUCOSE, CAPILLARY
GLUCOSE-CAPILLARY: 134 mg/dL — AB (ref 65–99)
GLUCOSE-CAPILLARY: 116 mg/dL — AB (ref 65–99)
GLUCOSE-CAPILLARY: 123 mg/dL — AB (ref 65–99)
Glucose-Capillary: 156 mg/dL — ABNORMAL HIGH (ref 65–99)

## 2016-07-30 MED ORDER — HYDROCODONE-ACETAMINOPHEN 10-325 MG PO TABS
1.0000 | ORAL_TABLET | ORAL | Status: DC | PRN
  Administered 2016-07-30 – 2016-08-01 (×6): 1 via ORAL
  Filled 2016-07-30 (×6): qty 1

## 2016-07-30 MED ORDER — POLYETHYLENE GLYCOL 3350 17 G PO PACK
17.0000 g | PACK | Freq: Every day | ORAL | Status: DC
  Filled 2016-07-30 (×2): qty 1

## 2016-07-30 MED ORDER — GUAIFENESIN ER 600 MG PO TB12
600.0000 mg | ORAL_TABLET | Freq: Two times a day (BID) | ORAL | Status: DC
  Administered 2016-07-30 – 2016-08-01 (×5): 600 mg via ORAL
  Filled 2016-07-30 (×5): qty 1

## 2016-07-30 MED ORDER — TAMSULOSIN HCL 0.4 MG PO CAPS
0.4000 mg | ORAL_CAPSULE | Freq: Every day | ORAL | Status: DC
  Administered 2016-07-30 – 2016-08-01 (×3): 0.4 mg via ORAL
  Filled 2016-07-30 (×3): qty 1

## 2016-07-30 NOTE — Progress Notes (Signed)
3 Days Post-Op   Subjective/Chief Complaint: Doing well.  More lucid today No BMs   Objective: Vital signs in last 24 hours: Temp:  [97.8 F (36.6 C)-100.7 F (38.2 C)] 98.8 F (37.1 C) (06/14 0801) Pulse Rate:  [79-98] 83 (06/14 0241) Resp:  [17-23] 19 (06/14 0241) BP: (128-149)/(69-95) 142/69 (06/14 0801) SpO2:  [94 %-99 %] 99 % (06/14 0801) Last BM Date:  (PTA)  Intake/Output from previous day: 06/13 0701 - 06/14 0700 In: 2338.8 [P.O.:600; I.V.:1738.8] Out: 1185 [Urine:1100; Drains:85] Intake/Output this shift: No intake/output data recorded.  General appearance: alert and cooperative GI: normal findings: bowel sounds normal and soft, nttp, ND, incision c/d/i,  and JP SS  Lab Results:   Recent Labs  07/28/16 0248 07/29/16 0337  WBC 22.6* 24.4*  HGB 11.6* 9.8*  HCT 37.8* 32.5*  PLT 364 341   BMET  Recent Labs  07/28/16 0248  NA 134*  K 3.6  CL 96*  CO2 26  GLUCOSE 213*  BUN 10  CREATININE 1.04  CALCIUM 8.8*    Assessment/Plan: s/p Procedure(s): EXPLORATORY LAPAROTOMY (N/A) LYSIS OF ADHESION (N/A) HERNIA REPAIR INCISIONAL (N/A) INSERTION OF MESH (N/A)  Pt doing well today.  Appears more lucid today. Advance diet to soft Mobilize with PT as tol IS 10x /hr Trx to floor  LOS: 3 days    Rosario Jacks., [facility] 07/30/2016

## 2016-07-30 NOTE — Care Management Note (Signed)
Case Management Note  Patient Details  Name: [name] [name] MRN: [id number] Date of Birth: 1945/06/05  Subjective/Objective:                    Action/Plan:  6/11 exp lap, LOA, Repair of incisional hernia, 6-14 soft diet, PT eval   Await PT recommendations Expected Discharge Date:                  Expected Discharge Plan:     In-House Referral:     Discharge planning Services  CM Consult  Post Acute Care Choice:    Choice offered to:     DME Arranged:    DME Agency:     HH Arranged:    HH Agency:     Status of Service:  In process, will continue to follow  If discussed at Long Length of Stay Meetings, dates discussed:    Additional Comments:  Marilu Favre, RN 07/30/2016, 11:15 AM

## 2016-07-30 NOTE — Evaluation (Signed)
Physical Therapy Evaluation Patient Details Name: [name] [name] MRN: [id number] DOB: [birth year] Today's Date: 07/30/2016   History of Present Illness  71 y.o. male admitted to [facility] on 07/27/16 s/p exploratory lap, lysis of adhesions, incisional hernia repair and some post op confusion.  JP drain on his left side.  Pt with significant PMH of spinal stenosis/scoliosis, PTSD, MI, HTN, gout, DM, CHF, CAD s/p CABG, anxiety (panic attacks), multiple abdominal surgeries, cervical fusion, and R TKA.  Clinical Impression  Pt very reluctantly got up with PT with a significant amout of coaxing.  He did walk with one person assist and RW around the end of the bed, but did not want to walk into the hallway today.  He would likely be able to return home with his wife's assist if he used a RW, which he has at home and had home therapy follow up.  I would like for him to work with the mobility tech as well while he is here to help ensure his progress.   PT to follow acutely for deficits listed below.     Follow Up Recommendations Home health PT;Supervision for mobility/OOB    Equipment Recommendations  None recommended by PT    Recommendations for Other Services  (referral to mobility tech) RN made aware to put in referral    Precautions / Restrictions Precautions Precautions: Fall Precaution Comments: pt reports h/o falls Restrictions Other Position/Activity Restrictions: abdominal/abdominal binder, JP drain left side      Mobility  Bed Mobility Overal bed mobility: Needs Assistance Bed Mobility: Rolling;Sidelying to Sit;Sit to Sidelying Rolling: Min assist Sidelying to sit: Min assist     Sit to sidelying: Min assist General bed mobility comments: Min assist to support trunk during transitions, verbal cues for log roll to help with pain during transitions and decrease strain on his abdomen.   Transfers Overall transfer level: Needs assistance Equipment used: Rolling walker (2  wheeled) Transfers: Sit to/from Stand Sit to Stand: Min assist         General transfer comment: Min assist to support trunk to get to standing.  Verbal cues for safe hand placement.   Ambulation/Gait Ambulation/Gait assistance: Min assist Ambulation Distance (Feet): 15 Feet Assistive device: Rolling walker (2 wheeled) Gait Pattern/deviations: Step-through pattern;Shuffle;Decreased step length - left;Decreased step length - right Gait velocity: decreased Gait velocity interpretation: Below normal speed for age/gender General Gait Details: Pt with shuflling gait pattern, slow speed, and per chart, at baseline, poor endurance for walking any distance.  He has a h/o falls and usually uses a cane.  He looked good with the support of both hands on a RW and has one at home.         Balance Overall balance assessment: Needs assistance Sitting-balance support: Feet supported;Bilateral upper extremity supported Sitting balance-Leahy Scale: Fair     Standing balance support: Bilateral upper extremity supported Standing balance-Leahy Scale: Poor                               Pertinent Vitals/Pain Pain Assessment: Faces Faces Pain Scale: Hurts even more Pain Location: abdomen Pain Descriptors / Indicators: Grimacing;Guarding Pain Intervention(s): Limited activity within patient's tolerance;Monitored during session;Repositioned    Home Living Family/patient expects to be discharged to:: Private residence Living Arrangements: Spouse/significant other (retired Therapist, sports) Available Help at Discharge: Family;Available 24 hours/day Type of Home: House Home Access: Stairs to enter Entrance Stairs-Rails: Right Entrance Stairs-Number of Steps: 2  Home Layout: One level Home Equipment: Walker - 2 wheels;Cane - single point;Transport chair      Prior Function Level of Independence: Independent with assistive device(s);Needs assistance   Gait / Transfers Assistance Needed: per pt  report mod I with cane both inside the house and in the community.  He has signs of a recent fall and reports he was not using a cane at that time (scrapes on bil knees, head)     Comments: Spouse reports that the pt was ambulatory for short distances (approx. 20 ft) with SPC. For longer distances, a transport chair is used. It was reported that the pt stayed in bed for 18-20 hours of the day. Independent with bathing and dressing (from previous admission notes)     Hand Dominance        Extremity/Trunk Assessment   Upper Extremity Assessment Upper Extremity Assessment: Generalized weakness    Lower Extremity Assessment Lower Extremity Assessment: RLE deficits/detail;LLE deficits/detail RLE Deficits / Details: Pt is generally weak in bil legs, uses a cane at baseline, h/o falls, bil knees with visible abrasions from a recent falls, decreased foot clearance and stride length.  LLE Deficits / Details: Pt is generally weak in bil legs, uses a cane at baseline, h/o falls, bil knees with visible abrasions from a recent falls, decreased foot clearance and stride length.     Cervical / Trunk Assessment Cervical / Trunk Assessment: Other exceptions Cervical / Trunk Exceptions: pt with h/o cervical spine surgery  Communication   Communication: No difficulties  Cognition Arousal/Alertness: Lethargic;Suspect due to medications Behavior During Therapy: [facility] for tasks assessed/performed Overall Cognitive Status: No family/caregiver present to determine baseline cognitive functioning                                 General Comments: Pt is A and O x4, but is lethargic, slow to process and has some acute hospital confusion which the chart says is normal for him after surgery.              Assessment/Plan    PT Assessment Patient needs continued PT services  PT Problem List Decreased strength;Decreased activity tolerance;Decreased balance;Decreased mobility;Decreased knowledge  of use of DME;Decreased safety awareness;Decreased knowledge of precautions;Pain       PT Treatment Interventions DME instruction;Gait training;Functional mobility training;Therapeutic activities;Therapeutic exercise;Balance training;Patient/family education;Cognitive remediation    PT Goals (Current goals can be found in the Care Plan section)  Acute Rehab PT Goals Patient Stated Goal: to stop having surgery and let this be his last trip to the hospital.  PT Goal Formulation: With patient Time For Goal Achievement: 08/13/16 Potential to Achieve Goals: Good    Frequency Min 3X/week    AM-PAC PT "6 Clicks" Daily Activity  Outcome Measure Difficulty turning over in bed (including adjusting bedclothes, sheets and blankets)?: Total Difficulty moving from lying on back to sitting on the side of the bed? : Total Difficulty sitting down on and standing up from a chair with arms (e.g., wheelchair, bedside commode, etc,.)?: Total Help needed moving to and from a bed to chair (including a wheelchair)?: A Little Help needed walking in hospital room?: A Little Help needed climbing 3-5 steps with a railing? : A Lot 6 Click Score: 11    End of Session Equipment Utilized During Treatment: Gait belt Activity Tolerance: Patient limited by fatigue;Patient limited by pain Patient left: in bed;with call bell/phone within reach;with bed alarm  set Nurse Communication: Mobility status;Other (comment) (please get a referral for Tamekia, mobility tech) PT Visit Diagnosis: Unsteadiness on feet (R26.81);Repeated falls (R29.6);Muscle weakness (generalized) (M62.81);Difficulty in walking, not elsewhere classified (R26.2);Pain Pain - part of body:  (abdomen)    Time: 8921-1941 PT Time Calculation (min) (ACUTE ONLY): 20 min   Charges:       Wells Guiles B. Geovanie Winnett, PT, DPT [phone]   PT Evaluation $PT Eval Moderate Complexity: 1 Procedure     07/30/2016, 5:03 PM

## 2016-07-30 NOTE — Care Management Important Message (Signed)
Important Message  Patient Details  Name: [name] [name] MRN: [id number] Date of Birth: 09/19/1945   Medicare Important Message Given:  Yes    Nathen May 07/30/2016, 9:26 AM

## 2016-07-31 LAB — GLUCOSE, CAPILLARY
GLUCOSE-CAPILLARY: 141 mg/dL — AB (ref 65–99)
Glucose-Capillary: 133 mg/dL — ABNORMAL HIGH (ref 65–99)
Glucose-Capillary: 129 mg/dL — ABNORMAL HIGH (ref 65–99)
Glucose-Capillary: 164 mg/dL — ABNORMAL HIGH (ref 65–99)

## 2016-07-31 NOTE — Progress Notes (Signed)
4 Days Post-Op   Subjective/Chief Complaint: Pt doing well this AM Some abd soreness BM x 2   Objective: Vital signs in last 24 hours: Temp:  [97.6 F (36.4 C)-98.7 F (37.1 C)] 97.6 F (36.4 C) (06/15 0506) Pulse Rate:  [82-103] 90 (06/15 0506) Resp:  [19] 19 (06/15 0506) BP: (104-133)/(58-79) 133/79 (06/15 0506) SpO2:  [94 %-99 %] 99 % (06/15 0506) Weight:  [108.8 kg (239 lb 13.8 oz)] 108.8 kg (239 lb 13.8 oz) (06/14 1045) Last BM Date: 07/30/16  Intake/Output from previous day: 06/14 0701 - 06/15 0700 In: 2295 [P.O.:1320; I.V.:975] Out: 140 [Drains:140] Intake/Output this shift: No intake/output data recorded.  General appearance: alert and cooperative GI: soft, approp ttp, ND, JP SS, incision c/d/i  Lab Results:   Recent Labs  07/29/16 0337  WBC 24.4*  HGB 9.8*  HCT 32.5*  PLT 341    Assessment/Plan: s/p Procedure(s): EXPLORATORY LAPAROTOMY (N/A) LYSIS OF ADHESION (N/A) HERNIA REPAIR INCISIONAL (N/A) INSERTION OF MESH (N/A) Con't Soft diet  Mobilize with PT con't pain control Doing well and likely DC in next 1-2d based on mobility   LOS: 4 days    Rosario Jacks., [facility] 07/31/2016

## 2016-07-31 NOTE — Evaluation (Signed)
Occupational Therapy Evaluation Patient Details Name: [name] [name] MRN: [id number] DOB: 23-Jun-1945 Today's Date: 07/31/2016    History of Present Illness 70 y.o. male admitted to [facility] on 07/27/16 s/p exploratory lap, lysis of adhesions, incisional hernia repair and some post op confusion.  JP drain on his left side.  Pt with significant PMH of spinal stenosis/scoliosis, PTSD, MI, HTN, gout, DM, CHF, CAD s/p CABG, anxiety (panic attacks), multiple abdominal surgeries, cervical fusion, and R TKA.   Clinical Impression   Pt reports he was managing ADL at mod I level PTA. Currently pt overall min guard for functional mobility and min-mod assist for ADL. Pt planning to d/c home with 24/7 supervision from wife. Discussed potential for follow up [place], however, pt and wife feel like they will be able to manage at home with just PT and RN follow up. Pt would benefit from continued skilled OT to address established goals.  OF NOTE: Pt with recent fall and c/o of R wrist pain without fx per xray. Per wife, she spoke with ortho PA-C who recommended use of wrist splint with thumb support (thumb spica). Contacted MD regarding splint order and awaiting response.     Follow Up Recommendations  No OT follow up;Supervision/Assistance - 24 hour    Equipment Recommendations  None recommended by OT    Recommendations for Other Services       Precautions / Restrictions Precautions Precautions: Fall Restrictions Weight Bearing Restrictions: No Other Position/Activity Restrictions: abdominal/abdominal binder, JP drain left side      Mobility Bed Mobility Overal bed mobility: Needs Assistance Bed Mobility: Rolling;Sidelying to Sit Rolling: Supervision Sidelying to sit: Min guard       General bed mobility comments: HOB flat with use of bed rails. Min guard for balance but no physical assist needed. Increased time required.  Transfers Overall transfer level: Needs assistance Equipment used: Rolling  walker (2 wheeled) Transfers: Sit to/from Stand Sit to Stand: Min guard         General transfer comment: Cues for hand placement with RW. Min guard for safety.    Balance Overall balance assessment: Needs assistance Sitting-balance support: No upper extremity supported;Feet supported Sitting balance-Leahy Scale: Good     Standing balance support: Bilateral upper extremity supported Standing balance-Leahy Scale: Poor Standing balance comment: RW for support                           ADL either performed or assessed with clinical judgement   ADL Overall ADL's : Needs assistance/impaired Eating/Feeding: Set up;Sitting   Grooming: Set up;Supervision/safety;Sitting   Upper Body Bathing: Minimal assistance;Sitting   Lower Body Bathing: Moderate assistance;Sit to/from stand   Upper Body Dressing : Minimal assistance;Sitting   Lower Body Dressing: Moderate assistance;Sit to/from stand   Toilet Transfer: Min guard;Ambulation;RW Toilet Transfer Details (indicate cue type and reason): Simulated by sit to stand from EOB with functional mobility         Functional mobility during ADLs: Min guard;Rolling walker       Vision         Perception     Praxis      Pertinent Vitals/Pain Pain Assessment: Faces Faces Pain Scale: Hurts even more Pain Location: abdomen, L shoulder/arm Pain Descriptors / Indicators: Aching;Grimacing;Guarding Pain Intervention(s): Limited activity within patient's tolerance;Monitored during session;Patient requesting pain meds-RN notified;Repositioned     Hand Dominance Left   Extremity/Trunk Assessment Upper Extremity Assessment Upper Extremity Assessment: Generalized weakness;RUE deficits/detail  RUE Deficits / Details: reports pain in L hand/arm/shoulder from fall. AROM overall WFL. Per wife, ortho PA recommending wrist splint with thumb support   Lower Extremity Assessment Lower Extremity Assessment: Defer to PT evaluation    Cervical / Trunk Assessment Cervical / Trunk Assessment: Other exceptions Cervical / Trunk Exceptions: pt with h/o cervical spine surgery   Communication Communication Communication: No difficulties   Cognition Arousal/Alertness: Awake/alert Behavior During Therapy: WFL for tasks assessed/performed Overall Cognitive Status: Within Functional Limits for tasks assessed                                     General Comments       Exercises     Shoulder Instructions      Home Living Family/patient expects to be discharged to:: Private residence Living Arrangements: Spouse/significant other Available Help at Discharge: Family;Available 24 hours/day Type of Home: House Home Access: Stairs to enter [employer] of Steps: 2 Entrance Stairs-Rails: Right Home Layout: One level     Bathroom Shower/Tub: Tub/shower unit;Curtain   Bathroom Toilet: Handicapped height     Home Equipment: Environmental consultant - 2 wheels;Cane - single point;Transport chair;Shower seat;Bedside commode;Grab bars - toilet;Grab bars - tub/shower;Hand held shower head          Prior Functioning/Environment Level of Independence: Independent with assistive device(s);Needs assistance  Gait / Transfers Assistance Needed: per pt report mod I with cane both inside the house and in the community.  He has signs of a recent fall and reports he was not using a cane at that time (scrapes on bil knees, head)     Comments: Spouse reports that the pt was ambulatory for short distances (approx. 20 ft) with SPC. For longer distances, a transport chair is used. It was reported that the pt stayed in bed for 18-20 hours of the day. Independent with bathing and dressing.        OT Problem List: Decreased strength;Decreased activity tolerance;Impaired balance (sitting and/or standing);Obesity;Pain      OT Treatment/Interventions: Self-care/ADL training;Energy conservation;DME and/or AE instruction;Therapeutic  activities;Splinting;Patient/family education;Balance training    OT Goals(Current goals can be found in the care plan section) Acute Rehab OT Goals Patient Stated Goal: to stop having surgery and let this be his last trip to the hospital.  OT Goal Formulation: With patient/family Time For Goal Achievement: 08/14/16 Potential to Achieve Goals: Good ADL Goals Pt Will Perform Upper Body Bathing: with supervision;sitting Pt Will Perform Lower Body Bathing: with supervision;sit to/from stand Pt Will Transfer to Toilet: with supervision;ambulating;regular height toilet Pt Will Perform Toileting - Clothing Manipulation and hygiene: with supervision;sit to/from stand Pt Will Perform Tub/Shower Transfer: Tub transfer;with supervision;ambulating;shower seat;rolling walker;grab bars  OT Frequency: Min 2X/week   Barriers to D/C:            Co-evaluation              AM-PAC PT "6 Clicks" Daily Activity     Outcome Measure Help from another person eating meals?: None Help from another person taking care of personal grooming?: A Little Help from another person toileting, which includes using toliet, bedpan, or urinal?: A Little Help from another person bathing (including washing, rinsing, drying)?: A Lot Help from another person to put on and taking off regular upper body clothing?: A Little Help from another person to put on and taking off regular lower body clothing?: A Lot 6 Click Score:  17   End of Session Equipment Utilized During Treatment: Gait belt;Rolling walker Nurse Communication: Mobility status;Patient requests pain meds;Other (comment) (will check with MD for splint order)  Activity Tolerance: Patient tolerated treatment well Patient left: in chair;with call bell/phone within reach;with family/visitor present  OT Visit Diagnosis: Unsteadiness on feet (R26.81);Muscle weakness (generalized) (M62.81);History of falling (Z91.81);Pain                Time: 7076-1518 OT Time  Calculation (min): 27 min Charges:  OT General Charges $OT Visit: 1 Procedure OT Evaluation $OT Eval Moderate Complexity: 1 Procedure OT Treatments $Self Care/Home Management : 8-22 mins G-Codes:     Mel Almond A. Ulice Brilliant, M.S., OTR/L Pager: [place] 07/31/2016, 3:48 PM

## 2016-07-31 NOTE — Progress Notes (Signed)
Orthopedic Tech Progress Note Patient Details:  [name] [name] Mar 05, 1945 [id number]  Ortho Devices Type of Ortho Device: Thumb velcro splint Ortho Device/Splint Location: RUE Ortho Device/Splint Interventions: Ordered, Application   Braulio Bosch 07/31/2016, 4:45 PM

## 2016-08-01 LAB — GLUCOSE, CAPILLARY
GLUCOSE-CAPILLARY: 124 mg/dL — AB (ref 65–99)
Glucose-Capillary: 116 mg/dL — ABNORMAL HIGH (ref 65–99)

## 2016-08-01 MED ORDER — FUROSEMIDE 10 MG/ML IJ SOLN
20.0000 mg | Freq: Once | INTRAMUSCULAR | Status: AC
  Administered 2016-08-01: 20 mg via INTRAVENOUS
  Filled 2016-08-01: qty 2

## 2016-08-01 NOTE — Progress Notes (Signed)
5 Days Post-Op   Subjective/Chief Complaint: [place].  + BM Asking to go home today Pain controled   Objective: Vital signs in last 24 hours: Temp:  [97.8 F (36.6 C)-98.6 F (37 C)] 97.8 F (36.6 C) (06/16 0425) Pulse Rate:  [87-92] 88 (06/16 0425) Resp:  [19] 19 (06/16 0425) BP: (131-146)/(64-73) 146/73 (06/16 0425) SpO2:  [96 %-100 %] 100 % (06/16 0425) Last BM Date: 08/01/16  Intake/Output from previous day: 06/15 0701 - 06/16 0700 In: 2383.8 [P.O.:1020; I.V.:1363.8] Out: 125 [Drains:125] Intake/Output this shift: No intake/output data recorded.  General appearance: alert and cooperative GI: soft, non-tender; bowel sounds normal; no masses,  no organomegaly and incision c/d/i, JP SS  Assessment/Plan: s/p Procedure(s): EXPLORATORY LAPAROTOMY (N/A) LYSIS OF ADHESION (N/A) HERNIA REPAIR INCISIONAL (N/A) INSERTION OF MESH (N/A) Consult SW for [facility] [place] PT Mobilize Lasix  Home later today if can set up PT [place]  LOS: 5 days    Rosario Jacks., [facility] 08/01/2016

## 2016-08-01 NOTE — Progress Notes (Signed)
Pt discharged to home accomp by wife who is a Marine scientist.  No new med changes for home.  HHRN and HHPT arrnaged with [facility].  Supplies given and wife understands importance of abd binder and how to change dressing if needed.  Pt for follow up appt for staple removal.  I DC'd the JP drain on the left lower quadrant and dressing applied.  Copy of DC instructions given and explained.

## 2016-08-01 NOTE — Progress Notes (Signed)
CM confirmed with pt he has all DME needed at home and family choose [facility] to render HHPT/RN services.  CM has requested face to face and HHPT/RN order.  Referral called to [facility] rep, Jermaine.  No other CM needs were communicated.

## 2016-08-01 NOTE — Progress Notes (Signed)
Occupational Therapy Treatment Patient Details Name: [name] [name] MRN: [id number] DOB: 06/22/1945 Today's Date: 08/01/2016    History of present illness 71 y.o. male admitted to [facility] on 07/27/16 s/p exploratory lap, lysis of adhesions, incisional hernia repair and some post op confusion.  JP drain on his left side.  Pt with significant PMH of spinal stenosis/scoliosis, PTSD, MI, HTN, gout, DM, CHF, CAD s/p CABG, anxiety (panic attacks), multiple abdominal surgeries, cervical fusion, and R TKA.   OT comments  Pt able to perform functional mobility this session with supervision for safety. Pt required max assist to don abdominal binder; reports he does not like it and took it off because it is uncomfortable. R thumb spica splint present in room, however, not donned due to current IV site. Educated pt on donning splint and performing skin checks upon return home; he verbalized understanding. D/c plan remains appropriate. Will continue to follow acutely.   Follow Up Recommendations  No OT follow up;Supervision/Assistance - 24 hour    Equipment Recommendations  None recommended by OT    Recommendations for Other Services      Precautions / Restrictions Precautions Precautions: Fall Restrictions Weight Bearing Restrictions: No Other Position/Activity Restrictions: abdominal/abdominal binder, JP drain left side       Mobility Bed Mobility Overal bed mobility: Needs Assistance Bed Mobility: Supine to Sit     Supine to sit: Supervision     General bed mobility comments: No physical assist, increased time required  Transfers Overall transfer level: Needs assistance Equipment used: Rolling walker (2 wheeled) Transfers: Sit to/from Stand Sit to Stand: Supervision         General transfer comment: Cues for hand placement but pt insists on pulling up from RW. Supervision for safety with steadying RW for him to pull up    Balance Overall balance assessment: Needs  assistance Sitting-balance support: Feet supported;No upper extremity supported Sitting balance-Leahy Scale: Good     Standing balance support: No upper extremity supported;During functional activity Standing balance-Leahy Scale: Fair Standing balance comment: Able to take hands off RW for few seconds to assist with donning abdominal binder                           ADL either performed or assessed with clinical judgement   ADL Overall ADL's : Needs assistance/impaired                 Upper Body Dressing : Maximal assistance Upper Body Dressing Details (indicate cue type and reason): pt with abdominal binder off upon arrival, max assist to don     Toilet Transfer: Supervision/safety;Ambulation;RW           Functional mobility during ADLs: Supervision/safety;Rolling walker General ADL Comments: Pt with R thumb spica splint present in room; IV site limiting use at this moment. Donned splint to check fit; looks good. Educated pt on skin checks with wearing splint at home; look for signs of redness or pressure, if occuring do not wear splint and follow up with MD.     Vision       Perception     Praxis      Cognition Arousal/Alertness: Awake/alert Behavior During Therapy: WFL for tasks assessed/performed Overall Cognitive Status: Within Functional Limits for tasks assessed  Exercises     Shoulder Instructions       General Comments      Pertinent Vitals/ Pain       Pain Assessment: Faces Faces Pain Scale: Hurts a little bit Pain Location: abdomen Pain Descriptors / Indicators: Sore Pain Intervention(s): Limited activity within patient's tolerance;Monitored during session;Premedicated before session;Repositioned  Home Living                                          Prior Functioning/Environment              Frequency  Min 2X/week        Progress Toward  Goals  OT Goals(current goals can now be found in the care plan section)  Progress towards OT goals: Progressing toward goals  Acute Rehab OT Goals Patient Stated Goal: to stop having surgery and let this be his last trip to the hospital.  OT Goal Formulation: With patient  Plan Discharge plan remains appropriate    Co-evaluation                 AM-PAC PT "6 Clicks" Daily Activity     Outcome Measure   Help from another person eating meals?: None Help from another person taking care of personal grooming?: A Little Help from another person toileting, which includes using toliet, bedpan, or urinal?: A Little Help from another person bathing (including washing, rinsing, drying)?: A Little Help from another person to put on and taking off regular upper body clothing?: A Lot Help from another person to put on and taking off regular lower body clothing?: A Lot 6 Click Score: 17    End of Session Equipment Utilized During Treatment: Rolling walker  OT Visit Diagnosis: Unsteadiness on feet (R26.81);Muscle weakness (generalized) (M62.81);History of falling (Z91.81);Pain Pain - part of body:  (abdomen)   Activity Tolerance Patient tolerated treatment well   Patient Left in chair;with call bell/phone within reach   Nurse Communication Mobility status;Other (comment) (performed splint check and education)        Time: 2820-6015 OT Time Calculation (min): 23 min  Charges: OT General Charges $OT Visit: 1 Procedure OT Treatments $Self Care/Home Management : 23-37 mins  Zaahir Pickney A. Ulice Brilliant, M.S., OTR/L Pager: [place] 08/01/2016, 11:14 AM

## 2016-08-01 NOTE — Discharge Summary (Signed)
Physician Discharge Summary  Patient ID: [name] [name] MRN: [id number] DOB/AGE: 1945/05/16 71 y.o.  Admit date: 07/27/2016 Discharge date: 08/01/2016  Admission Diagnoses:Status post ventral hernia repair with mesh  Discharge Diagnoses: Status post ventral hernia repair with mesh, acute delirium Active Problems:   S/P hernia repair   Discharged Condition: good  Hospital Course: Patient is a 71 year old male who was admitted status post open ventral abdominal wall hernia repair with mesh. Patient was initially sent to the stepdown unit secondary to his comorbidities.    patient did have some acute delirium on postoperative day 1. This was likely due to withdrawal from home medications, even though they were continued postoperatively. After 2 days his delirium cleared. Patient was ambulating well with physical therapy. He had good pain control. He was started on a liquid diet and advanced to a soft diet and tolerated that well. He had good bowel function on his own.  Patient was otherwise afebrile throughout his hospital stay. He had good pain control. He had good bowel function. He was cleared from physical therapy. Home health nursing was requested and set up by case management. He is otherwise deemed stable for discharge and discharged home.   Consults: None  Significant Diagnostic Studies: none  Treatments: surgery: as above  Discharge Exam: Blood pressure (!) 146/73, pulse 88, temperature 97.8 F (36.6 C), temperature source Oral, resp. rate 19, height 6' (1.829 m), weight 108.8 kg (239 lb 13.8 oz), SpO2 100 %. General appearance: alert and cooperative GI: soft, non-tender; bowel sounds normal; no masses,  no organomegaly  Disposition: 01-Home or Self Care  Discharge Instructions    Diet - low sodium heart healthy    Complete by:  As directed    Increase activity slowly    Complete by:  As directed      Allergies as of 08/01/2016      Reactions   Ciprofloxacin Hcl Other  (See Comments)   Causes pain in tendons, feels like muscle spasms. BLACK BOX WARNING RE: POSSIBLE TENDON RUPTURE, MYALGIAS, and OTHERS   Fentanyl Other (See Comments)   Pt can handle having Fentanyl under anesthesia - does not tolerate after surgery for post op pain - causes mood swings, confusion, and agitation        Medication List    TAKE these medications   acetaminophen 500 MG tablet Commonly known as:  TYLENOL Take 1,000 mg by mouth every 6 (six) hours as needed (for pain/headaches.).   allopurinol 300 MG tablet Commonly known as:  ZYLOPRIM Take 300 mg by mouth at bedtime.   aspirin EC 325 MG tablet Take 1 tablet (325 mg total) by mouth 2 (two) times daily after a meal. Take x 1 month post op. What changed:  when to take this  additional instructions   B-complex with vitamin C tablet Take 1 tablet by mouth daily with breakfast.   calcium carbonate 500 MG chewable tablet Commonly known as:  TUMS - dosed in mg elemental calcium Chew 1-3 tablets by mouth 3 (three) times daily as needed for indigestion or heartburn.   Vitamin D3 5000 units Tabs Take 5,000 Units by mouth.   cholecalciferol 1000 units tablet Commonly known as:  VITAMIN D Take 1,000 Units by mouth daily with breakfast.   clonazePAM 1 MG tablet Commonly known as:  KLONOPIN Take 1 mg by mouth 3 (three) times daily as needed for anxiety.   docusate sodium 100 MG capsule Commonly known as:  COLACE Take 100 mg by  mouth 4 (four) times daily as needed (for constipation).   DRY EYES OP Place 1 drop into both eyes 2 (two) times daily as needed (dry eyes).   FLORAJEN3 PO Take 1 capsule by mouth daily with breakfast.   gabapentin 300 MG capsule Commonly known as:  NEURONTIN Take 900 mg by mouth at bedtime.   HYDROcodone-acetaminophen 10-325 MG tablet Commonly known as:  NORCO Take 1 tablet by mouth every 4 (four) hours as needed (for pain).   hydrocortisone cream 1 % Apply 1 application topically  daily as needed for itching (for feet).   levothyroxine 75 MCG tablet Commonly known as:  SYNTHROID, LEVOTHROID Take 75 mcg by mouth daily before breakfast.   Magnesium 500 MG Tabs Take 500 mg by mouth daily with breakfast.   metFORMIN 500 MG 24 hr tablet Commonly known as:  GLUCOPHAGE-XR Take 1,000 mg by mouth at bedtime.   metoprolol tartrate 100 MG tablet Commonly known as:  LOPRESSOR Take 100 mg by mouth 2 (two) times daily.   nitroGLYCERIN 0.4 MG SL tablet Commonly known as:  NITROSTAT Place 1 tablet (0.4 mg total) under the tongue every 5 (five) minutes as needed.   omeprazole 40 MG capsule Commonly known as:  PRILOSEC Take 40 mg by mouth daily with breakfast.   potassium gluconate 595 (99 K) MG Tabs tablet Take 595 mg by mouth daily with breakfast.   valsartan 80 MG tablet Commonly known as:  DIOVAN Take 80 mg by mouth daily with breakfast.   venlafaxine XR 75 MG 24 hr capsule Commonly known as:  EFFEXOR-XR Take 225 mg by mouth at bedtime.   VICKS NYQUIL COUGH PO Take by mouth. Cough syrup      Follow-up Information    Ralene Ok, MD. Schedule an appointment as soon as possible for a visit in 2 week(s).   Specialty:  General Surgery Why:  For wound re-check Contact information: [address] [place] [address] [phone]           Signed: Rosario Jacks., [facility] 08/01/2016, 11:37 AM

## 2016-08-12 LAB — BASIC METABOLIC PANEL
BUN: 10 (ref 4–21)
CREATININE: 1.3 (ref 0.6–1.3)
Glucose: 102
Potassium: 4.8 (ref 3.4–5.3)
Sodium: 137 (ref 137–147)

## 2016-08-12 LAB — CBC AND DIFFERENTIAL
HEMATOCRIT: 27 — AB (ref 41–53)
Hemoglobin: 8 — AB (ref 13.5–17.5)
PLATELETS: 521 — AB (ref 150–399)
WBC: 8.8

## 2016-08-12 LAB — HEPATIC FUNCTION PANEL
ALK PHOS: 106 (ref 25–125)
ALT: 12 (ref 10–40)
AST: 10 — AB (ref 14–40)
Bilirubin, Total: 0.2

## 2016-08-13 ENCOUNTER — Telehealth: Payer: Self-pay | Admitting: Cardiology

## 2016-08-13 NOTE — Telephone Encounter (Signed)
Please call,Pt received a call from his Primary Care doctor office that he need to contact his Cardiologist. His BMP was 854.His D Dimer Test was 2.76.

## 2016-08-13 NOTE — Telephone Encounter (Signed)
Spoke with wife and patient was seen by NP at PCP office yesterday. Patient had abdominal surgery on 07/27/16, home on 08/01/16. He has been just laying around since surgery. PCP did call this am and advise for patient to go to ED for evaluation of elevated BNP 854 and elevated D-dimer 2.76. Patient did not feel up to going to ED and wanted Dr Hochrein's recommendations. Patients HHN saw patient today lungs clear except diminished left base, per wife is secondary to his history of pneumonia.  Denies any shortness of breath, lower extremity swelling, or pain. Only complaint is fatigue. Wife thinks there may be some abdominal swelling and will start measuring daily. O2 sat 97%. Patient had Lasix 20 mg this am and has voided multiple times. Discussed with Estrella Myrtle PA who felt patient needed to follow recommendation. Advised wife who stated she would discuss with her husband but would still like Dr Hochrein's recommendations. Explained he was not in the office but would forward for review

## 2016-08-14 ENCOUNTER — Emergency Department (HOSPITAL_COMMUNITY)
Admission: EM | Admit: 2016-08-14 | Discharge: 2016-08-14 | Disposition: A | Discharge status: Home / Self Care | Payer: Medicare Other | Attending: Emergency Medicine | Admitting: Emergency Medicine

## 2016-08-14 ENCOUNTER — Emergency Department (HOSPITAL_COMMUNITY): Payer: Medicare Other

## 2016-08-14 ENCOUNTER — Encounter (HOSPITAL_COMMUNITY): Payer: Self-pay | Admitting: *Deleted

## 2016-08-14 DIAGNOSIS — I110 Hypertensive heart disease with heart failure: Secondary | ICD-10-CM | POA: Insufficient documentation

## 2016-08-14 DIAGNOSIS — E119 Type 2 diabetes mellitus without complications: Secondary | ICD-10-CM | POA: Diagnosis not present

## 2016-08-14 DIAGNOSIS — N390 Urinary tract infection, site not specified: Secondary | ICD-10-CM | POA: Diagnosis not present

## 2016-08-14 DIAGNOSIS — E039 Hypothyroidism, unspecified: Secondary | ICD-10-CM | POA: Insufficient documentation

## 2016-08-14 DIAGNOSIS — I2510 Atherosclerotic heart disease of native coronary artery without angina pectoris: Secondary | ICD-10-CM | POA: Diagnosis not present

## 2016-08-14 DIAGNOSIS — I252 Old myocardial infarction: Secondary | ICD-10-CM | POA: Diagnosis not present

## 2016-08-14 DIAGNOSIS — R0602 Shortness of breath: Secondary | ICD-10-CM | POA: Diagnosis present

## 2016-08-14 DIAGNOSIS — Z87891 Personal history of nicotine dependence: Secondary | ICD-10-CM | POA: Insufficient documentation

## 2016-08-14 DIAGNOSIS — Z7982 Long term (current) use of aspirin: Secondary | ICD-10-CM | POA: Diagnosis not present

## 2016-08-14 DIAGNOSIS — Z7984 Long term (current) use of oral hypoglycemic drugs: Secondary | ICD-10-CM | POA: Insufficient documentation

## 2016-08-14 DIAGNOSIS — I509 Heart failure, unspecified: Secondary | ICD-10-CM

## 2016-08-14 DIAGNOSIS — Z951 Presence of aortocoronary bypass graft: Secondary | ICD-10-CM | POA: Insufficient documentation

## 2016-08-14 LAB — URINALYSIS, ROUTINE W REFLEX MICROSCOPIC
BILIRUBIN URINE: NEGATIVE
Glucose, UA: NEGATIVE mg/dL
Ketones, ur: NEGATIVE mg/dL
NITRITE: NEGATIVE
PROTEIN: 30 mg/dL — AB
Specific Gravity, Urine: 1.039 — ABNORMAL HIGH (ref 1.005–1.030)
pH: 6 (ref 5.0–8.0)

## 2016-08-14 LAB — BASIC METABOLIC PANEL
ANION GAP: 8 (ref 5–15)
BUN: 7 mg/dL (ref 6–20)
CHLORIDE: 100 mmol/L — AB (ref 101–111)
CO2: 28 mmol/L (ref 22–32)
Calcium: 8.7 mg/dL — ABNORMAL LOW (ref 8.9–10.3)
Creatinine, Ser: 1.12 mg/dL (ref 0.61–1.24)
GFR calc Af Amer: >60 mL/min (ref >60)
Glucose, Bld: 116 mg/dL — ABNORMAL HIGH (ref 65–99)
POTASSIUM: 4.2 mmol/L (ref 3.5–5.1)
SODIUM: 136 mmol/L (ref 135–145)

## 2016-08-14 LAB — BRAIN NATRIURETIC PEPTIDE: B NATRIURETIC PEPTIDE 5: 627.9 pg/mL — AB (ref 0.0–100.0)

## 2016-08-14 LAB — CBC
HEMATOCRIT: 28.1 % — AB (ref 39.0–52.0)
HEMOGLOBIN: 8.3 g/dL — AB (ref 13.0–17.0)
MCH: 24.3 pg — ABNORMAL LOW (ref 26.0–34.0)
MCHC: 29.5 g/dL — ABNORMAL LOW (ref 30.0–36.0)
MCV: 82.4 fL (ref 78.0–100.0)
Platelets: 453 10*3/uL — ABNORMAL HIGH (ref 150–400)
RBC: 3.41 MIL/uL — AB (ref 4.22–5.81)
RDW: 15.9 % — AB (ref 11.5–15.5)
WBC: 6.2 10*3/uL (ref 4.0–10.5)

## 2016-08-14 LAB — CBG MONITORING, ED: GLUCOSE-CAPILLARY: 120 mg/dL — AB (ref 65–99)

## 2016-08-14 LAB — TROPONIN I: Troponin I: <0.03 ng/mL (ref <0.03)

## 2016-08-14 MED ORDER — FUROSEMIDE 10 MG/ML IJ SOLN
20.0000 mg | Freq: Once | INTRAMUSCULAR | Status: AC
  Administered 2016-08-14: 20 mg via INTRAVENOUS
  Filled 2016-08-14: qty 2

## 2016-08-14 MED ORDER — TAMSULOSIN HCL 0.4 MG PO CAPS: 0.4000 mg | ORAL_CAPSULE | Freq: Every day | ORAL | 0 refills | Status: DC

## 2016-08-14 MED ORDER — DEXTROSE 5 % IV SOLN
1.0000 g | Freq: Once | INTRAVENOUS | Status: AC
  Administered 2016-08-14: 1 g via INTRAVENOUS
  Filled 2016-08-14: qty 10

## 2016-08-14 MED ORDER — CEPHALEXIN 500 MG PO CAPS: 500.0000 mg | ORAL_CAPSULE | Freq: Two times a day (BID) | ORAL | 0 refills | Status: DC

## 2016-08-14 MED ORDER — IOPAMIDOL (ISOVUE-370) INJECTION 76%
INTRAVENOUS | Status: AC
  Administered 2016-08-14: 100 mL
  Filled 2016-08-14: qty 100

## 2016-08-14 NOTE — Discharge Instructions (Signed)
Take 1 dose of lasix (20mg ) tomorrow and 1 dose the following day. Continue to check daily weights and watch salt intake. See Helayne Seminole early next week for re-evaluation. Return to ER if any worsening shortness of breath, chest pain, or fever.

## 2016-08-14 NOTE — ED Provider Notes (Signed)
[facility] DEPT Provider Note   CSN: [id number] Arrival date & time: 08/14/16  3500     History   Chief Complaint Chief Complaint  Patient presents with  . Shortness of Breath    HPI [name] [name] is a 71 y.o. male.  71 year old male with past medical history including CAD s/p CABG, CHF, HTN who p/w shortness of breath and abnormal lab results. The patient had a ventral hernia repair on 6/11 and has been recovering at home since then. He reports that his abdominal pain has been slowly improving. Wife notes that he has been very fatigued and not recovering well, laying around the house a lot with very little physical activity. He had a routine follow-up appointment with his PCP office 2 days ago. Because of his symptoms, they obtained BNP and d-dimer which were abnormal and they were instructed to come to the ED. They initially declined, but later talked with the patient's cardiologist Dr. Percival Spanish and decided to come in. The patient initially denies any shortness of breath but then later clarifies that he does have shortness of breath with exertion. He was complaining of some right rib pain last night according to wife but states that several weeks ago he had a bad fall onto his right side. He denies any chest pain currently. He has a mild cough at night but no fevers, vomiting, or severe abdominal pain. No drainage from his wound. No diarrhea or problems with bowel movements. He was given a prescription for Lasix and wife has been spot dosing 20 mg doses, last dose was yesterday. She has been tracking his weight over the past few weeks and he was as high as 238lb, this morning was 228lb. he denies any leg pain or swelling. No blood in his stool. He has had poor appetite.   The history is provided by the patient and the spouse.  Shortness of Breath     Past Medical History:  Diagnosis Date  . Anal fistula   . Anxiety    panic attacks  . Arthritis   . CAD (coronary artery disease)      a. s/p CABG 2001; b. LHC (3/09):  S-RCA, L-LAD, S-OM1/dCFX all patent, EF 55-60%;  c. myoview (12/12):  no ischemia, EF 68%;  d. Dob Echo (1/14):  + ECG changes but normal echo => med Rx cont'd  . Cancer [facility])    pt denies  . CHF (congestive heart failure) ([place])   . Complication of anesthesia    CONFUSION, psychosis after surgery for a week  . DDD (degenerative disc disease), lumbar    cervical thoracic and lumbar  . Diabetes mellitus without complication ([place])   . Diverticulitis   . GERD (gastroesophageal reflux disease)   . Gout   . History of pneumonia    septic pneumonia  . Hyperlipidemia   . Hypertension   . Hypothyroidism   . Myocardial infarction ([place])   . Osteoporosis   . PTSD (post-traumatic stress disorder)   . Scoliosis   . Spinal stenosis     Patient Active Problem List   Diagnosis Date Noted  . S/P hernia repair 07/27/2016  . Preop cardiovascular exam 05/30/2016  . S/P colostomy takedown 11/05/2015  . Abdominal pain   . Agitation   . Threatening to others   . Diverticulitis of large intestine with perforation without bleeding   . Type 2 diabetes mellitus with complication ([place])   . CAD in native artery   . Diverticulitis 04/26/2015  .  Perforated diverticulum of large intestine 04/26/2015  . Diverticulitis of colon with perforation 04/26/2015  . Depression 04/26/2015  . GERD (gastroesophageal reflux disease) 04/26/2015  . Hypothyroidism 04/26/2015  . Acute encephalopathy 08/25/2014  . Acute on chronic renal failure ([place]) 08/25/2014  . Blood poisoning   . Elevated troponin   . CAP (community acquired pneumonia) 08/24/2014  . Sepsis ([place]) 08/24/2014  . Hypokalemia 08/24/2014  . AKI (acute kidney injury) ([place]) 08/24/2014  . Alcohol abuse 08/24/2014  . DM (diabetes mellitus), type 2 ([place]) 02/28/2013  . Abnormal EKG 02/28/2013  . Fist[place]in-[place] 11/05/2011  . Hyperlipidemia 02/06/2011  . Insomnia 02/06/2011  . Hypertension   . CAD (coronary artery  disease)     Past Surgical History:  Procedure Laterality Date  . APPENDECTOMY    . BLEPHAROPLASTY Bilateral   . CERVICAL FUSION     times 2  . CHOLECYSTECTOMY    . COLOSTOMY REVERSAL  11/05/2015  . COLOSTOMY TAKEDOWN N/A 11/05/2015   Procedure: COLOSTOMY TAKEDOWN;  Surgeon: Georganna Skeans, MD;  Location: [facility];  Service: General;  Laterality: N/A;  . CORONARY ARTERY BYPASS GRAFT     2001 LIMA LAD, SVG OM/Circ dista, SVG PDA.  Cath 2009 with patent grafts  . HAND SURGERY Right   . HIP ARTHROPLASTY Right 03/01/2013   Procedure: ARTHROPLASTY BIPOLAR HIP;  Surgeon: Alta Corning, MD;  Location: [facility];  Service: Orthopedics;  Laterality: Right;  . INCISIONAL HERNIA REPAIR N/A 11/05/2015   Procedure: REPAIR INCISIONAL HERNIA;  Surgeon: Georganna Skeans, MD;  Location: [facility];  Service: General;  Laterality: N/A;  . INCISIONAL HERNIA REPAIR N/A 07/27/2016   Procedure: HERNIA REPAIR INCISIONAL;  Surgeon: Ralene Ok, MD;  Location: [facility];  Service: General;  Laterality: N/A;  . INSERTION OF MESH N/A 07/27/2016   Procedure: INSERTION OF MESH;  Surgeon: Ralene Ok, MD;  Location: [facility] [place];  Service: General;  Laterality: N/A;  . JOINT REPLACEMENT     Right knee  . KNEE ARTHROSCOPY Bilateral   . LAPAROTOMY N/A 07/27/2016   Procedure: EXPLORATORY LAPAROTOMY;  Surgeon: Ralene Ok, MD;  Location: [place] [facility];  Service: General;  Laterality: N/A;  . LYSIS OF ADHESION N/A 07/27/2016   Procedure: LYSIS OF ADHESION;  Surgeon: Ralene Ok, MD;  Location: [facility];  Service: General;  Laterality: N/A;  . PARTIAL COLECTOMY N/A 04/26/2015   Procedure: EXPLORATORY LAPAROTOMY PARTIAL COLECTOMY WITH COLOSTOMY AND HARTMANN'S;  Surgeon: Georganna Skeans, MD;  Location: [facility];  Service: General;  Laterality: N/A;  . PLACEMENT OF SETON     seton stitch  . STERNAL WIRE REMOVAL    . TREATMENT FISTULA ANAL     times 3       Home Medications    Prior to Admission medications   Medication Sig Start Date  End Date Taking? Authorizing Provider  acetaminophen (TYLENOL) 500 MG tablet Take 1,000 mg by mouth every 6 (six) hours as needed (for pain/headaches.).    [provider]  allopurinol (ZYLOPRIM) 300 MG tablet Take 300 mg by mouth at bedtime.     [provider]  Artificial Tear Ointment (DRY EYES OP) Place 1 drop into both eyes 2 (two) times daily as needed (dry eyes).    [provider]  aspirin EC 325 MG tablet Take 1 tablet (325 mg total) by mouth 2 (two) times daily after a meal. Take x 1 month post op. Patient taking differently: Take 325 mg by mouth daily with breakfast.  03/01/13  Gary Fleet, PA-C  B Complex-C (B-COMPLEX WITH VITAMIN C) tablet Take 1 tablet by mouth daily with breakfast.     [provider]  calcium carbonate (TUMS - DOSED IN MG ELEMENTAL CALCIUM) 500 MG chewable tablet Chew 1-3 tablets by mouth 3 (three) times daily as needed for indigestion or heartburn.    [provider]  cephALEXin (KEFLEX) 500 MG capsule Take 1 capsule (500 mg total) by mouth 2 (two) times daily. 08/14/16   Little, Wenda Overland, MD  cholecalciferol (VITAMIN D) 1000 UNITS tablet Take 1,000 Units by mouth daily with breakfast.     [provider]  Cholecalciferol (VITAMIN D3) 5000 units TABS Take 5,000 Units by mouth.    [provider]  clonazePAM (KLONOPIN) 1 MG tablet Take 1 mg by mouth 3 (three) times daily as needed for anxiety.     [provider]  docusate sodium (COLACE) 100 MG capsule Take 100 mg by mouth 4 (four) times daily as needed (for constipation).    [provider]  Doxylamine-DM (VICKS NYQUIL COUGH PO) Take by mouth. Cough syrup    [provider]  gabapentin (NEURONTIN) 300 MG capsule Take 900 mg by mouth at bedtime.  02/21/13   [provider]  HYDROcodone-acetaminophen (NORCO) 10-325 MG tablet Take 1 tablet by mouth every 4 (four) hours as needed (for pain).  04/29/16   [provider]  hydrocortisone cream 1 % Apply 1 application topically daily as needed for itching (for feet).    [provider]  levothyroxine (SYNTHROID, LEVOTHROID) 75 MCG tablet Take 75 mcg by mouth daily before breakfast.  04/13/15   [provider]  Magnesium 500 MG TABS Take 500 mg by mouth daily with breakfast.    [provider]  metFORMIN (GLUCOPHAGE-XR) 500 MG 24 hr tablet Take 1,000 mg by mouth at bedtime. 04/24/15   [provider]  metoprolol (LOPRESSOR) 100 MG tablet Take 100 mg by mouth 2 (two) times daily. 04/24/15   [provider]  nitroGLYCERIN (NITROSTAT) 0.4 MG SL tablet Place 1 tablet (0.4 mg total) under the tongue every 5 (five) minutes as needed. 02/23/12   Minus Breeding, MD  omeprazole (PRILOSEC) 40 MG capsule Take 40 mg by mouth daily with breakfast.  04/13/15   [provider]  potassium gluconate 595 (99 K) MG TABS tablet Take 595 mg by mouth daily with breakfast.     [provider]  Probiotic Product (FLORAJEN3 PO) Take 1 capsule by mouth daily with breakfast.    [provider]  tamsulosin (FLOMAX) 0.4 MG CAPS capsule Take 1 capsule (0.4 mg total) by mouth daily. 08/14/16   Little, Wenda Overland, MD  valsartan (DIOVAN) 80 MG tablet Take 80 mg by mouth daily with breakfast.    [provider]  venlafaxine XR (EFFEXOR-XR) 75 MG 24 hr capsule Take 225 mg by mouth at bedtime. 04/19/15   [provider]    Family History Family History  Problem Relation Age of Onset  . Colon cancer Mother   . Heart disease Father   . Heart disease Sister   . Heart disease Brother   . Diabetes Sister   . Heart disease Brother   . Heart disease Brother     Social History Social History  Substance Use Topics  . Smoking status: Former Smoker    Packs/day: 1.50    Years: 20.00    Types: Cigarettes    Quit date: 02/17/1976  . Smokeless tobacco:  Never Used  . Alcohol use No     Comment: over a  year      Allergies   Ciprofloxacin hcl and Fentanyl   Review of Systems Review of Systems  Respiratory: Positive for shortness of breath.    All other systems reviewed and are negative except that which was mentioned in HPI  Physical Exam Updated Vital Signs BP 121/72   Pulse (!) 59   Temp 98.3 F (36.8 C) (Oral)   Resp 13   Ht 6' (1.829 m)   Wt 103.4 kg (228 lb)   SpO2 100%   BMI 30.92 kg/m   Physical Exam  Constitutional: He is oriented to person, place, and time. He appears well-developed and well-nourished. No distress.  Pale but resting comfortably  HENT:  Head: Normocephalic and atraumatic.  Dry mouth  Eyes: Conjunctivae are normal. Pupils are equal, round, and reactive to light.  Neck: Neck supple.  Cardiovascular: Normal rate, regular rhythm and normal heart sounds.   No murmur heard. Pulmonary/Chest: He has no wheezes. He has no rales.  Very mildly increased WOB, diminished BS bases, faint crackles R lower lung  Abdominal: Soft. Bowel sounds are normal. There is tenderness (along incision).  Mild abd distension, midline incision clean/dry/intact w/ no erythema  Musculoskeletal: He exhibits no edema.  Neurological: He is alert and oriented to person, place, and time.  Fluent speech  Skin: Skin is warm and dry.  Psychiatric:  Flat affect  Nursing note and vitals reviewed.    ED Treatments / Results  Labs (all labs ordered are listed, but only abnormal results are displayed) Labs Reviewed  BASIC METABOLIC PANEL - Abnormal; Notable for the following:       Result Value   Chloride 100 (*)    Glucose, Bld 116 (*)    Calcium 8.7 (*)    All other components within normal limits  CBC - Abnormal; Notable for the following:    RBC 3.41 (*)    Hemoglobin 8.3 (*)    HCT 28.1 (*)    MCH 24.3 (*)    MCHC 29.5 (*)    RDW 15.9 (*)    Platelets 453 (*)    All other components within normal limits  URINALYSIS, ROUTINE W REFLEX MICROSCOPIC - Abnormal;  Notable for the following:    APPearance HAZY (*)    Specific Gravity, Urine 1.039 (*)    Hgb urine dipstick SMALL (*)    Protein, ur 30 (*)    Leukocytes, UA LARGE (*)    Bacteria, UA FEW (*)    Squamous Epithelial / LPF 0-5 (*)    All other components within normal limits  BRAIN NATRIURETIC PEPTIDE - Abnormal; Notable for the following:    B Natriuretic Peptide 627.9 (*)    All other components within normal limits  CBG MONITORING, ED - Abnormal; Notable for the following:    Glucose-Capillary 120 (*)    All other components within normal limits  URINE CULTURE  TROPONIN I  CBG MONITORING, ED    EKG  EKG Interpretation  Date/Time:  Friday August 14 2016 09:17:13 EDT Ventricular Rate:  63 PR Interval:  306 QRS Duration: 106 QT Interval:  420 QTC Calculation: 429 R Axis:   41 Text Interpretation:  Sinus rhythm with 1st degree A-V block ST & T wave abnormality, consider lateral ischemia Abnormal ECG since previous tracing, tachycardia and ST depressions in precordial leads improved T wave inversions in I and aVL new Confirmed by  Theotis Burrow ([id number]) on 08/14/2016 10:11:24 AM       Radiology Ct Angio Chest Pe W/cm &/or Wo Cm  Result Date: 08/14/2016 CLINICAL DATA:  71 year old male with history of hernia repair on 07/27/2016. Increasing shortness of breath for the past 2 days. Mid chest pain. Elevated D-dimer. EXAM: CT ANGIOGRAPHY CHEST WITH CONTRAST TECHNIQUE: Multidetector CT imaging of the chest was performed using the standard protocol during bolus administration of intravenous contrast. Multiplanar CT image reconstructions and MIPs were obtained to evaluate the vascular anatomy. CONTRAST:  80 mL of Isovue 370. COMPARISON:  No priors. FINDINGS: Cardiovascular: No filling defects within the pulmonary chill tree to suggest underlying pulmonary embolism. Heart size is mildly enlarged. There is no significant pericardial fluid, thickening or pericardial calcification. There is  aortic atherosclerosis, as well as atherosclerosis of the great vessels of the mediastinum and the coronary arteries, including calcified atherosclerotic plaque in the left main, left anterior descending, left circumflex and right coronary arteries. Status post median sternotomy for CABG, including LIMA to the LAD. Bypass graft to the left circumflex distribution appears likely occluded, although the chronicity of this is uncertain. Additionally, the bypass graft to the right coronary artery circulation is also potentially occluded or stenotic. Calcification of the aortic valve (mild). Mediastinum/Nodes: No pathologically enlarged mediastinal or hilar lymph nodes. Esophagus is unremarkable in appearance. No axillary lymphadenopathy. Lungs/Pleura: No acute consolidative airspace disease. No pleural effusions. Linear scarring in the left lower lobe and posterior aspect of the left upper lobe. No definite suspicious appearing pulmonary nodules or masses are noted. Mosaic attenuation throughout the lungs bilaterally, suggestive of air trapping related to small airways disease. Upper Abdomen: Aortic atherosclerosis. Musculoskeletal: Median sternotomy wires. There are no aggressive appearing lytic or blastic lesions noted in the visualized portions of the skeleton. Review of the MIP images confirms the above findings. IMPRESSION: 1. No evidence of pulmonary embolism. 2. Aortic atherosclerosis, in addition to left main and 3 vessel coronary artery disease. Status post median sternotomy for CABG including LIMA to the LAD. Although the LIMA graft is grossly patent, the other 2 bypass grafts extending to left circumflex and right coronary artery distributions appear likely occluded (particularly the left circumflex distribution graft) although the the chronicity of this is uncertain. Clinical correlation is suggested. 3. There are calcifications of the aortic valve. Echocardiographic correlation for evaluation of potential  valvular dysfunction may be warranted if clinically indicated. 4. Probable air trapping indicative of small airways disease. Aortic Atherosclerosis (ICD10-I70.0). Electronically Signed   By: Vinnie Langton M.D.   On: 08/14/2016 10:55    Procedures Procedures (including critical care time)  Medications Ordered in ED Medications  cefTRIAXone (ROCEPHIN) 1 g in dextrose 5 % 50 mL IVPB (1 g Intravenous New Bag/Given 08/14/16 1215)  iopamidol (ISOVUE-370) 76 % injection (100 mLs  Contrast Given 08/14/16 1030)  furosemide (LASIX) injection 20 mg (20 mg Intravenous Given 08/14/16 1125)     Initial Impression / Assessment and Plan / ED Course  I have reviewed the triage vital signs and the nursing notes.  Pertinent labs & imaging results that were available during my care of the patient were reviewed by me and considered in my medical decision making (see chart for details).    Pt sent by PCP After he had elevated d-dimer and BNP, history of surgery of a few weeks ago. On exam, he had stable vital signs, O2 saturation 98% on room air. Afebrile. He denied any complaints, specifically no chest pain and  initially denied shortness of breath although he later said he does have some shortness of breath with exertion. They have been managing him at home with doses of Lasix and his weight has gone from 238 to 228lb today.   Workup shows troponin negative, BNP 628, CTA negative for PE, consolidation, or edema. UA is c/w infection, culture added. I suspect mild volume overload given his BNP but he has normal creatinine today, no hypoxia, and no significant complaints therefore I feel he is appropriate for outpatient management. I have given a dose of IV Lasix here as well as ceftriaxone and given prescription for Keflex. He also endorses BPH symptoms and we will start him on Flomax as chronic urinary retention may be contributing to his UTI. He will follow-up with his PCP early next week and understands return  precautions including any worsening symptoms or fever. Wife will give 1 dose of Lasix tomorrow and the following day for further diuresis. They have voiced understanding of plan and patient discharged in satisfactory condition.  Final Clinical Impressions(s) / ED Diagnoses   Final diagnoses:  Acute on chronic congestive heart failure, unspecified heart failure type (HCC)  Urinary tract infection without hematuria, site unspecified    New Prescriptions New Prescriptions   CEPHALEXIN (KEFLEX) 500 MG CAPSULE    Take 1 capsule (500 mg total) by mouth 2 (two) times daily.   TAMSULOSIN (FLOMAX) 0.4 MG CAPS CAPSULE    Take 1 capsule (0.4 mg total) by mouth daily.     Little, Wenda Overland, MD 08/14/16 1227

## 2016-08-14 NOTE — ED Notes (Signed)
Pt wheeled to room from waiting area. Pt assisted into bed and onto monitor. Pt repositioned in bed.

## 2016-08-14 NOTE — ED Triage Notes (Signed)
Pt in r/o for blood clot in lung, pt seen PCP x 2 days ago for f/u post surgery for hernia repair on 07/27/16, pt has 2.76 D dimer x 2 days ago with BNP 800+, pt c/o SOB, denies CP, &I weakness, A&O x4

## 2016-08-14 NOTE — ED Notes (Signed)
Pt up to side of bed to use urinal with assistance from his wife.

## 2016-08-14 NOTE — ED Notes (Signed)
Pt assisted to standing at the side of the bed to try and provide a urine sample, pt was unable to urinate at this time. Stated need for urine sample, pt stated he would try again shortly.

## 2016-08-14 NOTE — ED Notes (Signed)
Pt verbalized understanding of d/c instructions and has no further questions. Pt stable and NAD. VSS.  

## 2016-08-14 NOTE — ED Notes (Signed)
Pt assisted to side of bed, pt stated he was ready to give urine sample. Pt then assisted back into bed and repositioned.

## 2016-08-14 NOTE — ED Notes (Signed)
Pt assisted to standing at the side of the bed to use the urinal. Pt then assisted back into bed.

## 2016-08-16 LAB — URINE CULTURE
Culture: >=100000 — AB
SPECIAL REQUESTS: NORMAL

## 2016-08-16 NOTE — Telephone Encounter (Signed)
No new suggestions.  Sounds like he should continue the meds as listed.

## 2016-08-17 ENCOUNTER — Telehealth: Payer: Self-pay | Admitting: Emergency Medicine

## 2016-08-17 NOTE — Progress Notes (Signed)
ED Antimicrobial Stewardship Positive Culture Follow Up   [name] [name] is an 71 y.o. male who presented to [facility] on 08/14/2016 with a chief complaint of  Chief Complaint  Patient presents with  . Shortness of Breath    Recent Results (from the past 720 hour(s))  Surgical pcr screen     Status: None   Collection Time: 07/20/16 11:36 AM  Result Value Ref Range Status   MRSA, PCR NEGATIVE NEGATIVE Final   Staphylococcus aureus NEGATIVE NEGATIVE Final    Comment:        The Xpert SA Assay (FDA approved for NASAL specimens in patients over 15 years of age), is one component of a comprehensive surveillance program.  Test performance has been validated by [facility] for patients greater than or equal to 31 year old. It is not intended to diagnose infection nor to guide or monitor treatment.   Urine culture     Status: Abnormal   Collection Time: 08/14/16 11:00 AM  Result Value Ref Range Status   Specimen Description URINE, CLEAN CATCH  Final   Special Requests Normal  Final   Culture >=100,000 COLONIES/mL ENTEROCOCCUS FAECALIS (A)  Final   Report Status 08/16/2016 FINAL  Final   Organism ID, Bacteria ENTEROCOCCUS FAECALIS (A)  Final      Susceptibility   Enterococcus faecalis - MIC*    AMPICILLIN <=2 SENSITIVE Sensitive     LEVOFLOXACIN 1 SENSITIVE Sensitive     NITROFURANTOIN <=16 SENSITIVE Sensitive     VANCOMYCIN 1 SENSITIVE Sensitive     * >=100,000 COLONIES/mL ENTEROCOCCUS FAECALIS    [x]  Treated with cephalexin, organism resistant to prescribed antimicrobial []  Patient discharged originally without antimicrobial agent and treatment is now indicated  New antibiotic prescription: amoxicillin 500 mg bid x 10d   ED Provider: Andee Poles, PA-C  Wynell Balloon 08/17/2016, 9:19 AM Infectious Diseases Pharmacist Phone# [phone]

## 2016-08-17 NOTE — Telephone Encounter (Signed)
Post ED Visit - Positive Culture Follow-up: Successful Patient Follow-Up  Culture assessed and recommendations reviewed by: []  Elenor Quinones, Pharm.D. []  Heide Guile, Pharm.D., BCPS AQ-ID [x]  Parks Neptune, Pharm.D., BCPS []  Alycia Rossetti, Pharm.D., BCPS []  [place], [place].D., BCPS, AAHIVP []  Legrand Como, Pharm.D., BCPS, AAHIVP []  Salome Arnt, PharmD, BCPS []  Dimitri Ped, PharmD, BCPS []  Vincenza Hews, PharmD, BCPS  Positive urine culture  []  Patient discharged without antimicrobial prescription and treatment is now indicated [x]  Organism is resistant to prescribed ED discharge antimicrobial []  Patient with positive blood cultures  Changes discussed with ED provider: Andee Poles NP New antibiotic prescription stop cephalexin, start amxoicillin 500mg  po bid x 10 days Called to [place] @ [phone] Contacted wife on patient's behalf   Hazle Nordmann 08/17/2016, 12:33 PM

## 2016-08-18 NOTE — Telephone Encounter (Signed)
Spoke with pt wife, aware of dr hochrein's recommendations. Questions regarding flomax, and furosemide answered.

## 2016-11-23 ENCOUNTER — Ambulatory Visit: Payer: Medicare Other | Admitting: Psychology

## 2016-12-02 ENCOUNTER — Ambulatory Visit: Payer: Medicare Other | Admitting: Neurology

## 2016-12-15 LAB — LIPID PANEL
Cholesterol: 114 (ref 0–200)
HDL: 29 — AB (ref 35–70)
LDL CALC: 51
TRIGLYCERIDES: 168 — AB (ref 40–160)

## 2016-12-15 LAB — BASIC METABOLIC PANEL
BUN: 12 (ref 4–21)
CREATININE: 1.2 (ref 0.6–1.3)
GLUCOSE: 104
Potassium: 4.4 (ref 3.4–5.3)
SODIUM: 136 — AB (ref 137–147)

## 2016-12-15 LAB — HEPATIC FUNCTION PANEL
ALT: 7 — AB (ref 10–40)
AST: 11 — AB (ref 14–40)
Alkaline Phosphatase: 69 (ref 25–125)
BILIRUBIN, TOTAL: 0.2

## 2016-12-15 LAB — HEMOGLOBIN A1C: HEMOGLOBIN A1C: 6.1

## 2016-12-28 ENCOUNTER — Telehealth: Payer: Self-pay | Admitting: Cardiology

## 2016-12-28 NOTE — Telephone Encounter (Signed)
Spoke with pt/wife (pt states that ok to speak w/wife) they state that pt went to the ER and and pt started taking Mucinex and started lasix 20mg , this is not on medication list, will add to list. . Also, since procedure in July 2018 he has been experiencing fatigue, cough at night and has been less active and only getting up to sit in chair and then feels like going back to bed. Pt has swelling in bilateral swelling  LE below the knee from the calf down and has not gained any weight his weight is now 235 and states that last week he went to the doctor and this was his weight then. They will continue to weigh daily and take and extra 20mg  now. Pt received a call from [facility] pharmacist and they changed valsartan to losartan 50mg  d/t recall, will change med list. Scheduled appt wednesday , as pt is unable to come today or tomorrow d/t another appt, pt is taking all other medications as ordered. Pt will call back tomorrow if the swelling continues or worsens or develops any new sx or weight inceases. Scheduled appt for 11-14.

## 2016-12-28 NOTE — Telephone Encounter (Signed)
Pt's wife called and said pt is in CHF and seems to betting worse. I made the pt an appointment for 12-31-16. Please call to evaluate.

## 2016-12-30 ENCOUNTER — Other Ambulatory Visit: Payer: Self-pay | Admitting: Physician Assistant

## 2016-12-30 ENCOUNTER — Encounter: Payer: Self-pay | Admitting: Physician Assistant

## 2016-12-30 ENCOUNTER — Ambulatory Visit: Payer: Medicare Other | Admitting: Physician Assistant

## 2016-12-30 VITALS — BP 124/72 | HR 58 | Ht 72.0 in | Wt 238.0 lb

## 2016-12-30 DIAGNOSIS — I5033 Acute on chronic diastolic (congestive) heart failure: Secondary | ICD-10-CM

## 2016-12-30 DIAGNOSIS — I5031 Acute diastolic (congestive) heart failure: Secondary | ICD-10-CM

## 2016-12-30 DIAGNOSIS — I10 Essential (primary) hypertension: Secondary | ICD-10-CM | POA: Diagnosis not present

## 2016-12-30 DIAGNOSIS — R079 Chest pain, unspecified: Secondary | ICD-10-CM

## 2016-12-30 MED ORDER — NITROGLYCERIN 0.4 MG SL SUBL: 0.4000 mg | SUBLINGUAL_TABLET | SUBLINGUAL | 2 refills | Status: DC | PRN

## 2016-12-30 MED ORDER — FUROSEMIDE 20 MG PO TABS: ORAL_TABLET | ORAL | 3 refills | Status: DC

## 2016-12-30 NOTE — Patient Instructions (Addendum)
Medication Instructions: INCREASE the furosemide to 60 mg daily for four days then decrease it back to 20 mg tablet daily. Take one potassium gluconate for every furosemide tablet that you take.  If you need a refill on your cardiac medications before your next appointment, please call your pharmacy.   Labwork: Your physician recommends that you return for lab work at next appointment for a BMET   Procedures/Testing: Your physician has requested that you have an echocardiogram. Echocardiography is a painless test that uses sound waves to create images of your heart. It provides your doctor with information about the size and shape of your heart and how well your heart's chambers and valves are working. This procedure takes approximately one hour. There are no restrictions for this procedure. This will be done at [address], [address].   Follow-Up: Your physician has made an appointment for 11/26 at 9 am with Rosaria Ferries, PA.  Special Instructions:  * If you gain 3 pounds overnight or 5 pounds in a week, then please increase your furosemide to 40 mg daily until you are back to your original weight.  * Please limit your fluid intake to 2 liters a day. This includes all fluids.   * Please keep a log of your daily weight. Weigh in the morning at the same time, on the same scale, after you have used the restroom.     Thank you for choosing [facility] at [facility]!!

## 2016-12-30 NOTE — Telephone Encounter (Signed)
Patient has OV 12/20/16 with Suanne Marker, [place]

## 2016-12-30 NOTE — Progress Notes (Signed)
Cardiology Office Note   Date:  12/30/2016   ID:  [name] [name], DOB October 14, 1945, MRN [id number]  PCP:  Merrilee Seashore, MD  Cardiologist: Dr. Percival Spanish, 05/29/2016 Rosaria Ferries, PA-C   Chief Complaint  Patient presents with  . Shortness of Breath  . Edema    abdomen    History of Present Illness: [name] [name] is a 71 y.o. male with a history of MI/CABG in 2001.LHC (04/2007) demonstrated LAD occluded, then 50%, circumflex 90% into a small distal continuation branch, RCA occluded, SVG-RCA patent, LIMA-LAD patent, SVG-OM1/distal circumflex patent. EF 55-60%.Myoview (01/2011) demonstrated no ischemia, EF 68%; normal study. Dobutamine echocardiogram (02/2012) demonstrated a positive ECG changes but no stress-induced wall motion abnormalities on echocardiogram (normal study). Seen in 2014>>05/2016  05/29/2016 office visit for preoperative evaluation, no further testing needed, patient doing well 11/12 phone note regarding shortness of breath and possible CHF, appointment made  [name] [name] presents for cardiology follow up. He is here today with his wife.  He was discharged after hernia surgery 06/16 ER visit 06/29 for SOB, elevated BNP and d-dimer, CT neg for PE or edema, rx w/ abx and IV Lasix>>d/c home  Ever since then, pt has been having problems w/ DOE. He developed PND and orthopnea. Mild LE edema, mostly daytime, especially when he wears a knee brace.  He gets chest pain, has had 3-4 episodes in the last month. It is a stinging pain, lower L chest. No change w/ deep inspiration, 7/10. No change in SOB, no N&V or diaphoresis. His BP has been as high as 200/100 during the episodes. High BP is consistent w/ CP. Takes 1-2 sl NTG w/ relief.   His movement is limited by back problems.  Ambulates very little.  He is on significant doses of hydrocodone, Neurontin, Klonopin and Effexor.  He had Bowen's disease and had cryotherapy to his upper sternum in early November.  The  wound is dry and not draining, but is still open and she is keeping it bandaged and dressed.  No signs of infection.  His blood sugar was up this am, 180, usually runs about 100.     Past Medical History:  Diagnosis Date  . Anal fistula   . Anxiety    panic attacks  . Arthritis   . CAD (coronary artery disease)    a. s/p CABG 2001; b. LHC (3/09):  S-RCA, L-LAD, S-OM1/dCFX all patent, EF 55-60%;  c. myoview (12/12):  no ischemia, EF 68%;  d. Dob Echo (1/14):  + ECG changes but normal echo => med Rx cont'd  . Cancer [facility])    pt denies  . CHF (congestive heart failure) ([place])   . Complication of anesthesia    CONFUSION, psychosis after surgery for a week  . DDD (degenerative disc disease), lumbar    cervical thoracic and lumbar  . Diabetes mellitus without complication ([place])   . Diverticulitis   . GERD (gastroesophageal reflux disease)   . Gout   . History of pneumonia    septic pneumonia  . Hyperlipidemia   . Hypertension   . Hypothyroidism   . Myocardial infarction ([place])   . Osteoporosis   . PTSD (post-traumatic stress disorder)   . Scoliosis   . Spinal stenosis     Past Surgical History:  Procedure Laterality Date  . APPENDECTOMY    . BLEPHAROPLASTY Bilateral   . CERVICAL FUSION     times 2  . CHOLECYSTECTOMY    . COLOSTOMY REVERSAL  11/05/2015  . CORONARY ARTERY BYPASS GRAFT     2001 LIMA LAD, SVG OM/Circ dista, SVG PDA.  Cath 2009 with patent grafts  . HAND SURGERY Right   . JOINT REPLACEMENT     Right knee  . KNEE ARTHROSCOPY Bilateral   . PLACEMENT OF SETON     seton stitch  . STERNAL WIRE REMOVAL    . TREATMENT FISTULA ANAL     times 3    Current Outpatient Medications  Medication Sig Dispense Refill  . acetaminophen (TYLENOL) 500 MG tablet Take 1,000 mg by mouth every 6 (six) hours as needed (for pain/headaches.).    Marland Kitchen allopurinol (ZYLOPRIM) 300 MG tablet Take 300 mg by mouth at bedtime.     . Artificial Tear Ointment (DRY EYES OP) Place 1 drop into  both eyes 2 (two) times daily as needed (dry eyes).    Marland Kitchen aspirin EC 325 MG tablet Take 1 tablet (325 mg total) by mouth 2 (two) times daily after a meal. Take x 1 month post op. (Patient taking differently: Take 325 mg by mouth daily with breakfast. ) 60 tablet 0  . B Complex-C (B-COMPLEX WITH VITAMIN C) tablet Take 1 tablet by mouth daily with breakfast.     . calcium carbonate (TUMS - DOSED IN MG ELEMENTAL CALCIUM) 500 MG chewable tablet Chew 1-3 tablets by mouth 3 (three) times daily as needed for indigestion or heartburn.    . Cholecalciferol (VITAMIN D3) 5000 units TABS Take 5,000 Units by mouth.    . clonazePAM (KLONOPIN) 1 MG tablet Take 1 mg by mouth 3 (three) times daily as needed for anxiety.      Furosemide 20 mg  1 tab daily    . docusate sodium (COLACE) 100 MG capsule Take 100 mg by mouth 4 (four) times daily as needed (for constipation).    . gabapentin (NEURONTIN) 300 MG capsule Take 900 mg by mouth at bedtime.     Marland Kitchen guaiFENesin (MUCINEX) 600 MG 12 hr tablet Take 600 mg 2 (two) times daily by mouth.    Marland Kitchen HYDROcodone-acetaminophen (NORCO) 10-325 MG tablet Take 1 tablet by mouth every 4 (four) hours as needed (for pain).   0  . hydrocortisone cream 1 % Apply 1 application topically daily as needed for itching (for feet).     Losartan 50 mg  1/2 tablet daily    . levothyroxine (SYNTHROID, LEVOTHROID) 75 MCG tablet Take 75 mcg by mouth daily before breakfast.   3  . Magnesium 500 MG TABS Take 500 mg by mouth daily with breakfast.    . metFORMIN (GLUCOPHAGE-XR) 500 MG 24 hr tablet Take 1,000 mg by mouth at bedtime.  1  . metoprolol (LOPRESSOR) 100 MG tablet Take 100 mg by mouth 2 (two) times daily.  0  . nitroGLYCERIN (NITROSTAT) 0.4 MG SL tablet Place 1 tablet (0.4 mg total) under the tongue every 5 (five) minutes as needed. 25 tablet 11  . omeprazole (PRILOSEC) 40 MG capsule Take 40 mg by mouth daily with breakfast.   13  . potassium gluconate 595 (99 K) MG TABS tablet Take 595 mg by  mouth daily with breakfast.     . Probiotic Product (FLORAJEN3 PO) Take 1 capsule by mouth daily with breakfast.    . tamsulosin (FLOMAX) 0.4 MG CAPS capsule Take 1 capsule (0.4 mg total) by mouth daily. 30 capsule 0  . venlafaxine XR (EFFEXOR-XR) 75 MG 24 hr capsule Take 225 mg by mouth at bedtime.  3  No current facility-administered medications for this visit.     Allergies:   Ciprofloxacin hcl and Fentanyl    Social History:  The patient  reports that he quit smoking about 40 years ago. His smoking use included cigarettes. He has a 30.00 pack-year smoking history. he has never used smokeless tobacco. He reports that he does not drink alcohol or use drugs.   Family History:  The patient's family history includes Colon cancer in his mother; Diabetes in his sister; Heart disease in his brother, brother, brother, father, and sister.    ROS:  Please see the history of present illness. All other systems are reviewed and negative.    PHYSICAL EXAM: VS:  BP 124/72   Pulse (!) 58   Ht 6' (1.829 m)   Wt 238 lb (108 kg)   BMI 32.28 kg/m  , BMI Body mass index is 32.28 kg/m. GEN: Well nourished, well developed, male in no acute distress  HEENT: normal for age  Neck: JVD 10-11 cm, no carotid bruit, no masses Cardiac: RRR; soft murmur, no rubs, or gallops Respiratory: Decreased breath sounds bases bilaterally, normal work of breathing GI: A bit firm, nontender, nondistended, + BS MS: no deformity or atrophy; trace lower extremity edema; distal pulses are 2+ in all 4 extremities   Skin: warm and dry, no rash Neuro:  Strength and sensation are at baseline Psych: euthymic mood, full affect   EKG:  EKG is ordered today. The ekg ordered today demonstrates sinus bradycardia, heart rate 58, first-degree AV block with PR interval 268 ms Lateral T wave flattening similar to 08/14/2016 ECG  ECHO: 08/26/2014 - Left ventricle: The cavity size was normal. Wall thickness was   normal. Systolic  function was normal. The estimated ejection   fraction was in the range of 55% to 60%. Wall motion was normal;   there were no regional wall motion abnormalities. Features are   consistent with a pseudonormal left ventricular filling pattern,   with concomitant abnormal relaxation and increased filling   pressure (grade 2 diastolic dysfunction). - Aortic valve: There was mild regurgitation. - Left atrium: The atrium was mildly dilated.   Recent Labs: 08/14/2016: B Natriuretic Peptide 627.9; BUN 7; Creatinine, Ser 1.12; Hemoglobin 8.3; Platelets 453; Potassium 4.2; Sodium 136    Lipid Panel    Component Value Date/Time   CHOL 121 04/28/2015 0529   TRIG 173 (H) 04/28/2015 0529   HDL 29 (L) 04/28/2015 0529   CHOLHDL 4.2 04/28/2015 0529   VLDL 35 04/28/2015 0529   LDLCALC 57 04/28/2015 0529     Wt Readings from Last 3 Encounters:  12/30/16 238 lb (108 kg)  08/14/16 228 lb (103.4 kg)  07/30/16 239 lb 13.8 oz (108.8 kg)     Other studies Reviewed: Additional studies/ records that were reviewed today include: Office notes, hospital records and testing.  ASSESSMENT AND PLAN:  1.  Acute on chronic diastolic CHF: Low-sodium diet at 2000 mg/day.  Limit fluids to 2 L daily.  Increase Lasix from 20 mg daily up to 60 mg daily for 4 days.  For every 20 mg of Lasix, take one potassium tablet.    After 4 days, go back to 20 mg daily but increase to 40 mg as needed for weight gains of 3 pounds in a day or 5 pounds in a week.  He had recent lab work done by his PCP.  Follow-up in 2 weeks with a BMET.  Check an echo.  2.  Hypertension: His blood pressure is under good control today.  His blood pressure was 90 systolic 1 day and his wife started cutting the losartan in half.  She is to continue the losartan at the lower dose.  Continue the metoprolol at current dose.  3.  Chest pain: The chest pain occurs without exertion.  It may be associated with high blood pressure readings, but it is not  clear which comes first.  If he is still having chest pain after his volume status is improved, consider Lexiscan Myoview   Current medicines are reviewed at length with the patient today.  The patient does not have concerns regarding medicines.  The following changes have been made: Increase Lasix and potassium temporarily  Labs/ tests ordered today include:   Orders Placed This Encounter  Procedures  . Basic metabolic panel  . EKG 12-Lead     Disposition:   FU with Dr. Percival Spanish  Signed, Rosaria Ferries, PA-C  12/30/2016 4:41 PM    [name] [name] Phone: [phone]; Fax: [phone]  This note was written with the assistance of speech recognition software. Please excuse any transcriptional errors.

## 2016-12-30 NOTE — Telephone Encounter (Signed)
Noted  

## 2016-12-31 ENCOUNTER — Ambulatory Visit: Payer: Medicare Other | Admitting: Cardiology

## 2016-12-31 NOTE — Telephone Encounter (Signed)
OV 11/26 with rhonda barrett

## 2017-01-04 LAB — HM DIABETES EYE EXAM

## 2017-01-11 ENCOUNTER — Ambulatory Visit: Payer: Medicare Other | Admitting: Physician Assistant

## 2017-01-11 ENCOUNTER — Telehealth: Payer: Self-pay | Admitting: Physician Assistant

## 2017-01-11 ENCOUNTER — Encounter: Payer: Self-pay | Admitting: Physician Assistant

## 2017-01-11 VITALS — BP 124/71 | HR 59 | Ht 72.0 in | Wt 226.0 lb

## 2017-01-11 DIAGNOSIS — I25810 Atherosclerosis of coronary artery bypass graft(s) without angina pectoris: Secondary | ICD-10-CM | POA: Diagnosis not present

## 2017-01-11 DIAGNOSIS — Z79899 Other long term (current) drug therapy: Secondary | ICD-10-CM

## 2017-01-11 DIAGNOSIS — I5032 Chronic diastolic (congestive) heart failure: Secondary | ICD-10-CM

## 2017-01-11 NOTE — Patient Instructions (Addendum)
Medication Instructions:  CONTINUE LASIX MAY TAKE EXTRA UP TO 60MG  DAILY FOR WT GAIN OR SWELLING  MAKE SURE TO TAKE POTASSIUM FOR EVERY LASIX YOU ARE TAKING  If you need a refill on your cardiac medications before your next appointment, please call your pharmacy.  Labwork: BMET TODAY HERE IN OUR OFFICE AT [facility]  Special Instructions: EXERCISE THREE TIMES DAILY FOR 10 MINUTES  TARGET HR WHILE EXERCISING 110-115  PLEASE CALL AND SCHEDULE APPOINTMENT IF YOU HAVE CHEST PAIN   Follow-Up: Your physician wants you to follow-up in: IN April WITH DR [facility] Please call our office February 2019 to schedule the April 2019 follow-up appointment.   Thank you for choosing [facility]!!

## 2017-01-11 NOTE — Progress Notes (Signed)
Cardiology Office Note   Date:  01/11/2017   ID:  [name] [name], DOB Jun 22, 1945, MRN [id number]  PCP:  Merrilee Seashore, MD  Cardiologist: Dr. Percival Spanish, 05/29/2016 Rosaria Ferries, PA-C 12/30/2016  Chief Complaint  Patient presents with  . Follow-up    History of Present Illness: [name] [name] is a 71 y.o. male with a history of  MI/CABG in 2001.LHC (04/2007)demonstratedLAD occluded, then 50%, circumflex 90% into a small distal continuation branch, RCA occluded, SVG-RCA patent, LIMA-LAD patent, SVG-OM1/distal circumflex patent. EF 55-60%.Myoview (01/2011)demonstrated no ischemia, EF 68%; normal study. Dobutamine echocardiogram (02/2012)demonstrated a positive ECG changes but no stress-induced wall motion abnormalities on echocardiogram (normal study).Seen in 2014>>05/2016  11/14 office visit, patient weight up 10 pounds, Lasix increased, potassium increased, losartan decreased, chest pain evaluation deferred until volume status improved  [name] [name] presents for cardiology follow up.  His breathing is better now. No PND, +nocturia x 1 per night, no orthopnea. No LE edema. He has been able to move around a little more without getting sob.  He feels 100% better. Activity is now limited only by his back problems. He can walk some in the house and does this. He goes 150 ft down the driveway or down the hall in the house. He can do this now without getting SOB.  His chest pain has resolved except in response to certain foods. His BP will go up and he will have intense chest pain. He will lie down and sx will gradually resolve. He only has this in response to certain foods. He tries to take small bites and chew thoroughly. Has take nitro for this with no response.  He does not feel his chest pain is cardiac.  He feels his heart does not give him any problems. He remembers the MI, has no sx reminiscent of that.    Past Medical History:  Diagnosis Date  . Anal fistula   .  Anxiety    panic attacks  . Arthritis   . CAD (coronary artery disease)    a. s/p CABG 2001; b. LHC (3/09):  S-RCA, L-LAD, S-OM1/dCFX all patent, EF 55-60%;  c. myoview (12/12):  no ischemia, EF 68%;  d. Dob Echo (1/14):  + ECG changes but normal echo => med Rx cont'd  . Cancer [facility])    pt denies  . CHF (congestive heart failure) ([place])   . Complication of anesthesia    CONFUSION, psychosis after surgery for a week  . DDD (degenerative disc disease), lumbar    cervical thoracic and lumbar  . Diabetes mellitus without complication ([place])   . Diverticulitis   . GERD (gastroesophageal reflux disease)   . Gout   . History of pneumonia    septic pneumonia  . Hyperlipidemia   . Hypertension   . Hypothyroidism   . Myocardial infarction ([place])   . Osteoporosis   . PTSD (post-traumatic stress disorder)   . Scoliosis   . Spinal stenosis     Past Surgical History:  Procedure Laterality Date  . APPENDECTOMY    . BLEPHAROPLASTY Bilateral   . CERVICAL FUSION     times 2  . CHOLECYSTECTOMY    . COLOSTOMY REVERSAL  11/05/2015  . COLOSTOMY TAKEDOWN N/A 11/05/2015   Procedure: COLOSTOMY TAKEDOWN;  Surgeon: Georganna Skeans, MD;  Location: [place];  Service: General;  Laterality: N/A;  . CORONARY ARTERY BYPASS GRAFT     2001 LIMA LAD, SVG OM/Circ dista, SVG PDA.  Cath 2009 with patent  grafts  . HAND SURGERY Right   . HIP ARTHROPLASTY Right 03/01/2013   Procedure: ARTHROPLASTY BIPOLAR HIP;  Surgeon: Alta Corning, MD;  Location: [facility];  Service: Orthopedics;  Laterality: Right;  . INCISIONAL HERNIA REPAIR N/A 11/05/2015   Procedure: REPAIR INCISIONAL HERNIA;  Surgeon: Georganna Skeans, MD;  Location: [place];  Service: General;  Laterality: N/A;  . INCISIONAL HERNIA REPAIR N/A 07/27/2016   Procedure: HERNIA REPAIR INCISIONAL;  Surgeon: Ralene Ok, MD;  Location: [place];  Service: General;  Laterality: N/A;  . INSERTION OF MESH N/A 07/27/2016   Procedure: INSERTION OF MESH;  Surgeon: Ralene Ok, MD;  Location: [place];  Service: General;  Laterality: N/A;  . JOINT REPLACEMENT     Right knee  . KNEE ARTHROSCOPY Bilateral   . LAPAROTOMY N/A 07/27/2016   Procedure: EXPLORATORY LAPAROTOMY;  Surgeon: Ralene Ok, MD;  Location: [place];  Service: General;  Laterality: N/A;  . LYSIS OF ADHESION N/A 07/27/2016   Procedure: LYSIS OF ADHESION;  Surgeon: Ralene Ok, MD;  Location: [place];  Service: General;  Laterality: N/A;  . PARTIAL COLECTOMY N/A 04/26/2015   Procedure: EXPLORATORY LAPAROTOMY PARTIAL COLECTOMY WITH COLOSTOMY AND HARTMANN'S;  Surgeon: Georganna Skeans, MD;  Location: [place];  Service: General;  Laterality: N/A;  . PLACEMENT OF SETON     seton stitch  . STERNAL WIRE REMOVAL    . TREATMENT FISTULA ANAL     times 3    Current Outpatient Medications  Medication Sig Dispense Refill  . acetaminophen (TYLENOL) 500 MG tablet Take 1,000 mg by mouth every 6 (six) hours as needed (for pain/headaches.).    Marland Kitchen allopurinol (ZYLOPRIM) 300 MG tablet Take 300 mg by mouth at bedtime.     . Artificial Tear Ointment (DRY EYES OP) Place 1 drop into both eyes 2 (two) times daily as needed (dry eyes).    Marland Kitchen aspirin EC 325 MG tablet Take 1 tablet (325 mg total) by mouth 2 (two) times daily after a meal. Take x 1 month post op. (Patient taking differently: Take 325 mg by mouth daily with breakfast. ) 60 tablet 0  . B Complex-C (B-COMPLEX WITH VITAMIN C) tablet Take 1 tablet by mouth daily with breakfast.     . calcium carbonate (TUMS - DOSED IN MG ELEMENTAL CALCIUM) 500 MG chewable tablet Chew 1-3 tablets by mouth 3 (three) times daily as needed for indigestion or heartburn.    . Cholecalciferol (VITAMIN D3) 5000 units TABS Take 5,000 Units by mouth.    . clonazePAM (KLONOPIN) 1 MG tablet Take 1 mg by mouth 3 (three) times daily as needed for anxiety.     . docusate sodium (COLACE) 100 MG capsule Take 100 mg by mouth 4 (four) times daily as needed (for constipation).    . furosemide  (LASIX) 20 MG tablet Take a 20 mg tablet daily or as directed 90 tablet 3  . gabapentin (NEURONTIN) 300 MG capsule Take 900 mg by mouth at bedtime.     Marland Kitchen guaiFENesin (MUCINEX) 600 MG 12 hr tablet Take 600 mg 2 (two) times daily by mouth.    Marland Kitchen HYDROcodone-acetaminophen (NORCO) 10-325 MG tablet Take 1 tablet by mouth every 6 (six) hours as needed (for pain).   0  . hydrocortisone cream 1 % Apply 1 application topically daily as needed for itching (for feet).    Marland Kitchen levothyroxine (SYNTHROID, LEVOTHROID) 75 MCG tablet Take 75 mcg by mouth daily before breakfast.   3  .  Magnesium 500 MG TABS Take 500 mg by mouth daily with breakfast.    . metFORMIN (GLUCOPHAGE-XR) 500 MG 24 hr tablet Take 1,000 mg by mouth at bedtime.  1  . metoprolol (LOPRESSOR) 100 MG tablet Take 100 mg by mouth 2 (two) times daily.  0  . nitroGLYCERIN (NITROSTAT) 0.4 MG SL tablet Place 1 tablet (0.4 mg total) every 5 (five) minutes as needed under the tongue. 25 tablet 2  . omeprazole (PRILOSEC) 40 MG capsule Take 40 mg by mouth daily with breakfast.   13  . potassium gluconate 595 (99 K) MG TABS tablet Take 595 mg by mouth daily with breakfast.     . Probiotic Product (FLORAJEN3 PO) Take 1 capsule by mouth daily with breakfast.    . venlafaxine XR (EFFEXOR-XR) 75 MG 24 hr capsule Take 225 mg by mouth at bedtime.  3   No current facility-administered medications for this visit.     Allergies:   Ciprofloxacin hcl and Fentanyl    Social History:  The patient  reports that he quit smoking about 40 years ago. His smoking use included cigarettes. He has a 30.00 pack-year smoking history. he has never used smokeless tobacco. He reports that he does not drink alcohol or use drugs.   Family History:  The patient's family history includes Colon cancer in his mother; Diabetes in his sister; Heart disease in his brother, brother, brother, father, and sister.    ROS:  Please see the history of present illness. All other systems are  reviewed and negative.    PHYSICAL EXAM: VS:  BP 124/71   Pulse (!) 59   Ht 6' (1.829 m)   Wt 226 lb (102.5 kg)   BMI 30.65 kg/m  , BMI Body mass index is 30.65 kg/m. GEN: Well nourished, well developed, male in no acute distress  HEENT: normal for age  Neck: no JVD, no carotid bruit, no masses Cardiac: RRR; no murmur, no rubs, or gallops Respiratory:  clear to auscultation bilaterally, normal work of breathing GI: soft, nontender, nondistended, + BS MS: no deformity or atrophy; no edema; distal pulses are 2+ in all 4 extremities   Skin: warm and dry, no rash Neuro:  Strength and sensation are intact Psych: euthymic mood, full affect   EKG:  EKG is not ordered today.  ECHO: 08/26/2014 - Left ventricle: The cavity size was normal. Wall thickness was normal. Systolic function was normal. The estimated ejection fraction was in the range of 55% to 60%. Wall motion was normal; there were no regional wall motion abnormalities. Features are consistent with a pseudonormal left ventricular filling pattern, with concomitant abnormal relaxation and increased filling pressure (grade 2 diastolic dysfunction). - Aortic valve: There was mild regurgitation. - Left atrium: The atrium was mildly dilated   Recent Labs: 08/14/2016: B Natriuretic Peptide 627.9; BUN 7; Creatinine, Ser 1.12; Hemoglobin 8.3; Platelets 453; Potassium 4.2; Sodium 136    Lipid Panel    Component Value Date/Time   CHOL 121 04/28/2015 0529   TRIG 173 (H) 04/28/2015 0529   HDL 29 (L) 04/28/2015 0529   CHOLHDL 4.2 04/28/2015 0529   VLDL 35 04/28/2015 0529   LDLCALC 57 04/28/2015 0529     Wt Readings from Last 3 Encounters:  01/11/17 226 lb (102.5 kg)  12/30/16 238 lb (108 kg)  08/14/16 228 lb (103.4 kg)     Other studies Reviewed: Additional studies/ records that were reviewed today include: Office notes, hospital records and testing.  ASSESSMENT  AND PLAN:  1.  Chronic diastolic CHF: His  weight is down 12 pounds since his last visit.  His breathing is greatly improved.  He is encouraged to continue a low-sodium diet and daily weights.  His wife is working diligently to make sure the food they eat is low in sodium.  He does not appear to be over drinking.  To continue Lasix 20 mg daily with extra doses as needed for weight gain or edema.  Potassium tablet for every Lasix tablet.  BMET today  2.  CAD: He is having no ischemic symptoms.  He does not wish to cut his aspirin back to 81 mg a day.  Continue beta-blocker.  He is not currently on a statin.  His LDL was 57 in 2017.  Previously, his LFTs were abnormal secondary to EtOH use.  Currently denies alcohol use.  According to Mr. Lipschutz, he is able to walk for 10 minutes at a time.  He was advised that his target heart rate would only be 110-120 bpm.  He is encouraged to to walk or do some sort of activity for 10 minutes 3 times a day.  3.  Medication management: His med list is on 325 mg of aspirin twice daily.  Since he had a hip fracture in January 2015 when he was supposed to take it for 1 month postop after hip fracture and repair.  In discussion with the patient, it was noted that he is only taking it once a day.  He wishes to continue the aspirin at 3 225 mg/day   Current medicines are reviewed at length with the patient today.  The patient does not have concerns regarding medicines.  The following changes have been made:   Labs/ tests ordered today include:   Orders Placed This Encounter  Procedures  . Basic metabolic panel     Disposition:   FU with Dr. Percival Spanish  Signed, Rosaria Ferries, PA-C  01/11/2017 2:13 PM    [facility] Phone: [phone]; Fax: [phone]  This note was written with the assistance of speech recognition software. Please excuse any transcriptional errors.

## 2017-01-11 NOTE — Telephone Encounter (Signed)
Patient's wife called because she had concerns about his testing and medications. I had previously asked him if it was okay if I speak freely in front of her and he had said yes.  I reviewed in detail his echocardiogram from 2016. I explained that he improved so quickly I did not feel strongly that he had to have another one right now. As he has multiple doctors visits and procedures, especially for his back, she wishes to defer this also.  She will contact us if she wishes to schedule the echo.  Otherwise, she understands to keep follow-up appointments as scheduled.  I also reviewed his ECG and cardiac medications with her. I advised that although his PR interval was prolonged, I did not feel this was causing any symptoms. I explained that his heart rate was in the 50s when we did his EKG.  As he is asymptomatic, that is not a problem.  If she feels he is having any symptoms related to a low heart rate, she needs to call us so that we can decrease his metoprolol.  I advised that I did not feel the need to make any additional medication changes at this time.  She agrees, will call us if she has any questions or concerns. She appreciated the call and the information.  Rosaria Ferries, PA-C 01/11/2017 5:26 PM [facility] [phone]

## 2017-01-12 LAB — BASIC METABOLIC PANEL
BUN/Creatinine Ratio: 10 (ref 10–24)
BUN: 12 mg/dL (ref 8–27)
CALCIUM: 9.2 mg/dL (ref 8.6–10.2)
CHLORIDE: 97 mmol/L (ref 96–106)
CO2: 29 mmol/L (ref 20–29)
Creatinine, Ser: 1.15 mg/dL (ref 0.76–1.27)
GFR calc Af Amer: 73 mL/min/{1.73_m2} (ref >59)
GFR calc non Af Amer: 63 mL/min/{1.73_m2} (ref >59)
GLUCOSE: 82 mg/dL (ref 65–99)
Potassium: 4.5 mmol/L (ref 3.5–5.2)
Sodium: 139 mmol/L (ref 134–144)

## 2017-01-14 ENCOUNTER — Other Ambulatory Visit (HOSPITAL_COMMUNITY): Payer: Medicare Other

## 2017-02-26 ENCOUNTER — Encounter: Payer: Medicare Other | Admitting: Psychology

## 2017-03-23 ENCOUNTER — Telehealth: Payer: Self-pay | Admitting: Physician Assistant

## 2017-03-23 NOTE — Telephone Encounter (Signed)
New message  Pt wife verbalized that she is calling for the RN  Pt needs to come in before his recall date she   Says pt has CHF and he is tired and dont want to get out bed

## 2017-03-23 NOTE — Telephone Encounter (Signed)
Returned call to patient's wife. She reports patient is tired, no energy. These symptoms have been going on for a few weeks. She reports occasional really high BP (190/100 for example) but then it'll settle down. He will take NTG and ASA when he has chest pain - has not had any in the last 2-3 weeks. She states he is not having SOB, but he does not do anything. He monitors weight - he will take additional lasix as needed for weight gain. He is not needing additional diuretic often.   Offered PA office visit but patient/wife prefer that he see MD. There is no availability until March.  Routed to MD to advise

## 2017-03-24 NOTE — Telephone Encounter (Signed)
I could see him at 4:20 on Monday.

## 2017-03-25 NOTE — Telephone Encounter (Signed)
Pt have appt with Dr Percival Spanish 02/11 @ 1:40 pm

## 2017-03-28 NOTE — Progress Notes (Signed)
Cardiology Office Note   Date:  03/29/2017   ID:  [name] [name], DOB Dec 31, 1945, MRN [id number]  PCP:  Merrilee Seashore, MD  Cardiologist:   Minus Breeding, MD  Referring:  Dr. Ralene Ok  Chief Complaint  Patient presents with  . Fatigue      History of Present Illness: [name] [name] is a 72 y.o. male who presents for *CAD, status post CABG in 2001.   LHC (04/2007) demonstrated LAD occluded, then 50%, circumflex occluded, 90% into a small distal continuation branch, RCA occluded, SVG-RCA patent, LIMA-LAD patent, SVG-OM1/distal circumflex patent. EF 55-60%.   Myoview (01/2011) demonstrated no ischemia, EF 68%; normal study.  Dobutamine echocardiogram (02/2012) demonstrated a positive ECG changes but no stress-induced wall motion abnormalities on echocardiogram (normal study).    Since I last saw him April of last year he has been seen and had to have his ARB reduced because of low BPs.  He had some excess fluid and and his Lasix increased in Nov.  His wife called last week because he has had no energy.  He has had problems walking he gets around with a cane.  He has spinal stenosis and knee problems.  He does not sleep well.  However, his fatigue has been progressive and his wife who is a nurse thinks is out of proportion to his joint problems.  He is not had any PND or orthopnea.  Is not any palpitations, presyncope or syncope.  He denies any weight gain or edema.  He will occasionally have taken nitroglycerin.  He does this if his blood pressure is going up and he has "esophageal spasm".    Of note we did walking around the office and he had a normal heart rate response though he walks slowly.  His oxygen level stayed fine.   Past Medical History:  Diagnosis Date  . Anal fistula   . Anxiety    panic attacks  . Arthritis   . CAD (coronary artery disease)    a. s/p CABG 2001; b. LHC (3/09):  S-RCA, L-LAD, S-OM1/dCFX all patent, EF 55-60%;  c. myoview (12/12):  no ischemia, EF  68%;  d. Dob Echo (1/14):  + ECG changes but normal echo => med Rx cont'd  . Cancer [facility])    pt denies  . CHF (congestive heart failure) ([place])   . Complication of anesthesia    CONFUSION, psychosis after surgery for a week  . DDD (degenerative disc disease), lumbar    cervical thoracic and lumbar  . Diabetes mellitus without complication ([place])   . Diverticulitis   . GERD (gastroesophageal reflux disease)   . Gout   . History of pneumonia    septic pneumonia  . Hyperlipidemia   . Hypertension   . Hypothyroidism   . Myocardial infarction ([place])   . Osteoporosis   . PTSD (post-traumatic stress disorder)   . Scoliosis   . Spinal stenosis     Past Surgical History:  Procedure Laterality Date  . APPENDECTOMY    . BLEPHAROPLASTY Bilateral   . CERVICAL FUSION     times 2  . CHOLECYSTECTOMY    . COLOSTOMY REVERSAL  11/05/2015  . COLOSTOMY TAKEDOWN N/A 11/05/2015   Procedure: COLOSTOMY TAKEDOWN;  Surgeon: Georganna Skeans, MD;  Location: [place];  Service: General;  Laterality: N/A;  . CORONARY ARTERY BYPASS GRAFT     2001 LIMA LAD, SVG OM/Circ dista, SVG PDA.  Cath 2009 with patent grafts  . HAND SURGERY  Right   . HIP ARTHROPLASTY Right 03/01/2013   Procedure: ARTHROPLASTY BIPOLAR HIP;  Surgeon: Alta Corning, MD;  Location: [facility];  Service: Orthopedics;  Laterality: Right;  . INCISIONAL HERNIA REPAIR N/A 11/05/2015   Procedure: REPAIR INCISIONAL HERNIA;  Surgeon: Georganna Skeans, MD;  Location: [place];  Service: General;  Laterality: N/A;  . INCISIONAL HERNIA REPAIR N/A 07/27/2016   Procedure: HERNIA REPAIR INCISIONAL;  Surgeon: Ralene Ok, MD;  Location: [place];  Service: General;  Laterality: N/A;  . INSERTION OF MESH N/A 07/27/2016   Procedure: INSERTION OF MESH;  Surgeon: Ralene Ok, MD;  Location: [place];  Service: General;  Laterality: N/A;  . JOINT REPLACEMENT     Right knee  . KNEE ARTHROSCOPY Bilateral   . LAPAROTOMY N/A 07/27/2016   Procedure: EXPLORATORY LAPAROTOMY;   Surgeon: Ralene Ok, MD;  Location: [place];  Service: General;  Laterality: N/A;  . LYSIS OF ADHESION N/A 07/27/2016   Procedure: LYSIS OF ADHESION;  Surgeon: Ralene Ok, MD;  Location: [place];  Service: General;  Laterality: N/A;  . PARTIAL COLECTOMY N/A 04/26/2015   Procedure: EXPLORATORY LAPAROTOMY PARTIAL COLECTOMY WITH COLOSTOMY AND HARTMANN'S;  Surgeon: Georganna Skeans, MD;  Location: [place];  Service: General;  Laterality: N/A;  . PLACEMENT OF SETON     seton stitch  . STERNAL WIRE REMOVAL    . TREATMENT FISTULA ANAL     times 3     Current Outpatient Medications  Medication Sig Dispense Refill  . acetaminophen (TYLENOL) 500 MG tablet Take 1,000 mg by mouth every 6 (six) hours as needed (for pain/headaches.).    Marland Kitchen allopurinol (ZYLOPRIM) 300 MG tablet Take 300 mg by mouth at bedtime.     . Artificial Tear Ointment (DRY EYES OP) Place 1 drop into both eyes 2 (two) times daily as needed (dry eyes).    Marland Kitchen aspirin EC 325 MG tablet Take 1 tablet (325 mg total) by mouth 2 (two) times daily after a meal. Take x 1 month post op. (Patient taking differently: Take 325 mg by mouth daily with breakfast. ) 60 tablet 0  . B Complex-C (B-COMPLEX WITH VITAMIN C) tablet Take 1 tablet by mouth daily with breakfast.     . calcium carbonate (TUMS - DOSED IN MG ELEMENTAL CALCIUM) 500 MG chewable tablet Chew 1-3 tablets by mouth 3 (three) times daily as needed for indigestion or heartburn.    . Cholecalciferol (VITAMIN D3) 5000 units TABS Take 5,000 Units by mouth.    . clonazePAM (KLONOPIN) 1 MG tablet Take 1 mg by mouth 3 (three) times daily as needed for anxiety.     . docusate sodium (COLACE) 100 MG capsule Take 100 mg by mouth 4 (four) times daily as needed (for constipation).    . furosemide (LASIX) 20 MG tablet Take a 20 mg tablet daily or as directed 90 tablet 3  . gabapentin (NEURONTIN) 300 MG capsule Take 900 mg by mouth at bedtime.     Marland Kitchen guaiFENesin (MUCINEX) 600 MG 12 hr tablet Take 600 mg  2 (two) times daily by mouth.    Marland Kitchen HYDROcodone-acetaminophen (NORCO) 10-325 MG tablet Take 1 tablet by mouth every 6 (six) hours as needed (for pain).   0  . hydrocortisone cream 1 % Apply 1 application topically daily as needed for itching (for feet).    Marland Kitchen HYDROmorphone (DILAUDID) 2 MG tablet Take 2 mg by mouth every 12 (twelve) hours as needed for severe pain.    Marland Kitchen  levothyroxine (SYNTHROID, LEVOTHROID) 75 MCG tablet Take 75 mcg by mouth daily before breakfast.   3  . losartan (COZAAR) 50 MG tablet Take 1 tablet by mouth daily.  6  . Magnesium 500 MG TABS Take 500 mg by mouth daily with breakfast.    . metFORMIN (GLUCOPHAGE-XR) 500 MG 24 hr tablet Take 1,000 mg by mouth at bedtime.  1  . metoprolol (LOPRESSOR) 100 MG tablet Take 100 mg by mouth 2 (two) times daily.  0  . nitroGLYCERIN (NITROSTAT) 0.4 MG SL tablet Place 1 tablet (0.4 mg total) every 5 (five) minutes as needed under the tongue. 25 tablet 2  . omeprazole (PRILOSEC) 40 MG capsule Take 40 mg by mouth daily with breakfast.   13  . potassium gluconate 595 (99 K) MG TABS tablet Take 595 mg by mouth daily with breakfast.     . Probiotic Product (FLORAJEN3 PO) Take 1 capsule by mouth daily with breakfast.    . rOPINIRole (REQUIP) 0.5 MG tablet Take 1 tablet by mouth daily.  2  . venlafaxine XR (EFFEXOR-XR) 75 MG 24 hr capsule Take 225 mg by mouth at bedtime.  3   No current facility-administered medications for this visit.     Allergies:   Ciprofloxacin hcl and Fentanyl     ROS:  Please see the history of present illness.   Otherwise, review of systems are positive for none.  All other systems are reviewed and negative.    PHYSICAL EXAM: VS:  BP 116/74   Pulse 65   Ht 5\' 10"  (1.778 m)   Wt 230 lb (104.3 kg)   SpO2 97%   BMI 33.00 kg/m  , BMI Body mass index is 33 kg/m.  GENERAL: Chronically ill-appearing NECK:  No jugular venous distention, waveform within normal limits, carotid upstroke brisk and symmetric, no bruits, no  thyromegaly LUNGS:  Clear to auscultation bilaterally CHEST:  Well healed sternotomy scar. HEART:  PMI not displaced or sustained,S1 and S2 within normal limits, no S3, no S4, no clicks, no rubs, no murmurs ABD:  Flat, positive bowel sounds normal in frequency in pitch, no bruits, no rebound, no guarding, no midline pulsatile mass, no hepatomegaly, no splenomegaly EXT:  2 plus pulses throughout, no edema, no cyanosis no clubbing    EKG:  EKG is ordered today. Sinus rhythm, rate 64, axis within normal limits, first degree AV block, lateral T wave inversions.  No change from previous  Recent Labs: 08/14/2016: B Natriuretic Peptide 627.9; Hemoglobin 8.3; Platelets 453 01/11/2017: BUN 12; Creatinine, Ser 1.15; Potassium 4.5; Sodium 139    Lipid Panel    Component Value Date/Time   CHOL 121 04/28/2015 0529   TRIG 173 (H) 04/28/2015 0529   HDL 29 (L) 04/28/2015 0529   CHOLHDL 4.2 04/28/2015 0529   VLDL 35 04/28/2015 0529   LDLCALC 57 04/28/2015 0529     Lab Results  Component Value Date   HGBA1C 5.9 (H) 10/28/2015     Wt Readings from Last 3 Encounters:  03/29/17 230 lb (104.3 kg)  01/11/17 226 lb (102.5 kg)  12/30/16 238 lb (108 kg)      Other studies Reviewed: Additional studies/ records that were reviewed today include: labs Review of the above records demonstrates:     ASSESSMENT AND PLAN:   CAD:   He has some atypical pain.  I do not strongly suspect ischemia as the etiology but I will plan to evaluate this if he has no other explanation for his  chronic fatigue.  He might need a dobutamine stress test.   DM:  I did review his last A1c which was 6.1.  He will continue meds as listed.  HTN:   The blood pressure is at target.  He will continue the meds as listed.   DYSLIPIDEMIA:    His LDL was 51.  He will continue meds as listed.   FATIGUE: I think this might be related in part to the fact that he is not sleeping.  He has severe insomnia.  However, he also he had  hemoglobin of 8.3 in the summer and I do not see a repeat and we called his primary office to see if there are any other labs.  I am going to start with a CBC and TSH.  We did walk him around the office and he had chronotropic competence.  Current medicines are reviewed at length with the patient today.  The patient does not have concerns regarding medicines.  The following changes have been made:   None  Labs/ tests ordered today include:    Orders Placed This Encounter  Procedures  . CBC  . TSH  . Basic Metabolic Panel (BMET)  . EKG 12-Lead     Disposition:   FU with me in APP in two months.     Signed, Minus Breeding, MD  03/29/2017 3:09 PM    [facility]

## 2017-03-29 ENCOUNTER — Ambulatory Visit: Payer: Medicare Other | Admitting: Cardiology

## 2017-03-29 ENCOUNTER — Encounter: Payer: Self-pay | Admitting: Cardiology

## 2017-03-29 VITALS — BP 116/74 | HR 65 | Ht 70.0 in | Wt 230.0 lb

## 2017-03-29 DIAGNOSIS — Z79899 Other long term (current) drug therapy: Secondary | ICD-10-CM

## 2017-03-29 DIAGNOSIS — D649 Anemia, unspecified: Secondary | ICD-10-CM

## 2017-03-29 DIAGNOSIS — I25119 Atherosclerotic heart disease of native coronary artery with unspecified angina pectoris: Secondary | ICD-10-CM

## 2017-03-29 DIAGNOSIS — I10 Essential (primary) hypertension: Secondary | ICD-10-CM

## 2017-03-29 DIAGNOSIS — R5383 Other fatigue: Secondary | ICD-10-CM

## 2017-03-29 NOTE — Patient Instructions (Addendum)
Medication Instructions:  Continue current medications  If you need a refill on your cardiac medications before your next appointment, please call your pharmacy.  Labwork: CBC, TSH and BMP HERE IN OUR OFFICE AT [facility]  Take the provided lab slips for you to take with you to the lab for you blood draw.   You will NOT need to fast   You may go to any [facility] lab that is convenient for you however, we do have a lab in our office that is able to assist you. You do NOT need an appointment for our lab. Once in our office lobby there is a podium to the right of the check-in desk where you are to sign-in and ring a doorbell to alert [place] you are here. Lab is open Monday-Friday from 8:00am to 4:00pm; and is closed for lunch from 12:45p-1:45pm   Testing/Procedures: None Ordered  Follow-Up: Your physician wants you to follow-up in: 2 Months with Rosaria Ferries.     Thank you for choosing [facility] at [facility]!!

## 2017-03-30 LAB — CBC
HEMATOCRIT: 29.4 % — AB (ref 37.5–51.0)
HEMOGLOBIN: 8.1 g/dL — AB (ref 13.0–17.7)
MCH: 19.7 pg — ABNORMAL LOW (ref 26.6–33.0)
MCHC: 27.6 g/dL — ABNORMAL LOW (ref 31.5–35.7)
MCV: 72 fL — ABNORMAL LOW (ref 79–97)
Platelets: 253 10*3/uL (ref 150–379)
RBC: 4.11 x10E6/uL — ABNORMAL LOW (ref 4.14–5.80)
RDW: 17.4 % — ABNORMAL HIGH (ref 12.3–15.4)
WBC: 8.1 10*3/uL (ref 3.4–10.8)

## 2017-03-30 LAB — BASIC METABOLIC PANEL
BUN / CREAT RATIO: 13 (ref 10–24)
BUN: 15 mg/dL (ref 8–27)
CALCIUM: 9.3 mg/dL (ref 8.6–10.2)
CHLORIDE: 96 mmol/L (ref 96–106)
CO2: 30 mmol/L — AB (ref 20–29)
CREATININE: 1.15 mg/dL (ref 0.76–1.27)
GFR calc Af Amer: 73 mL/min/{1.73_m2} (ref >59)
GFR calc non Af Amer: 63 mL/min/{1.73_m2} (ref >59)
GLUCOSE: 76 mg/dL (ref 65–99)
Potassium: 4.6 mmol/L (ref 3.5–5.2)
Sodium: 139 mmol/L (ref 134–144)

## 2017-03-30 LAB — TSH: TSH: 3.49 u[IU]/mL (ref 0.450–4.500)

## 2017-04-01 ENCOUNTER — Encounter: Payer: Self-pay | Admitting: Family Medicine

## 2017-04-01 ENCOUNTER — Ambulatory Visit (INDEPENDENT_AMBULATORY_CARE_PROVIDER_SITE_OTHER): Payer: Medicare Other | Admitting: Family Medicine

## 2017-04-01 VITALS — BP 128/74 | HR 65 | Ht 70.0 in | Wt 230.6 lb

## 2017-04-01 DIAGNOSIS — Z87891 Personal history of nicotine dependence: Secondary | ICD-10-CM

## 2017-04-01 DIAGNOSIS — D649 Anemia, unspecified: Secondary | ICD-10-CM | POA: Diagnosis not present

## 2017-04-01 DIAGNOSIS — I152 Hypertension secondary to endocrine disorders: Secondary | ICD-10-CM

## 2017-04-01 DIAGNOSIS — I10 Essential (primary) hypertension: Secondary | ICD-10-CM | POA: Diagnosis not present

## 2017-04-01 DIAGNOSIS — E1169 Type 2 diabetes mellitus with other specified complication: Secondary | ICD-10-CM

## 2017-04-01 DIAGNOSIS — I509 Heart failure, unspecified: Secondary | ICD-10-CM

## 2017-04-01 DIAGNOSIS — E118 Type 2 diabetes mellitus with unspecified complications: Secondary | ICD-10-CM

## 2017-04-01 DIAGNOSIS — R5383 Other fatigue: Secondary | ICD-10-CM

## 2017-04-01 DIAGNOSIS — I2510 Atherosclerotic heart disease of native coronary artery without angina pectoris: Secondary | ICD-10-CM

## 2017-04-01 DIAGNOSIS — E1159 Type 2 diabetes mellitus with other circulatory complications: Secondary | ICD-10-CM | POA: Diagnosis not present

## 2017-04-01 DIAGNOSIS — I5032 Chronic diastolic (congestive) heart failure: Secondary | ICD-10-CM | POA: Insufficient documentation

## 2017-04-01 DIAGNOSIS — E782 Mixed hyperlipidemia: Secondary | ICD-10-CM

## 2017-04-01 NOTE — Patient Instructions (Addendum)
Come in in near future for fasting labs.  I will need to see your old medical records for comparison.  - In the short time I spent with you folks today and from what you are telling me, it appears this anemia is a relatively new thing in the past 1-2 years. Also, from your historical information, it appears the anemia is new and occurred after various procedures were done on the patient. This would point to the fact that he could have a leak or bleed from one of those procedures.   While we will be getting blood work in the near future, I do believe the anemia is real and this warrants follow up with your general surgeons as well as possibly GI to rule out other etiologies for bleeding. Also please call your cardiologist and see at what point Naomi would require a transfusion due to his CAD and various other cardiovascular conditions. Come in the near future to get FBW and if the anemia is real, this will continue to be my recommendation.

## 2017-04-01 NOTE — Progress Notes (Signed)
New patient office visit note:  Impression and Recommendations:    1. Type 2 diabetes mellitus with complication, unspecified whether long term insulin use ([place])   2. Hypertension associated with diabetes ([place])   3. Mixed diabetic hyperlipidemia associated with type 2 diabetes mellitus ([place])   4. Congestive heart failure, unspecified HF chronicity, unspecified heart failure type ([place])   5. Coronary artery disease involving native heart, angina presence unspecified, unspecified vessel or lesion type   6. Fatigue, unspecified type   7. History of smoking 30 or more pack years   8. Anemia, unspecified type    1. DM2- last A1c in 12-2016 was 6.5. Pt is tolerating his medications well. Continue at this time. He was due to follow up in March 2019.  2. HTN assoc with diabetes- pt is compliant with medications, continue as prescribed.  3. Mixed diabetic HLD- pt is compliant with medications, continue as prescribed.   4. CHF- Pt's cardiologist, Dr. Percival Spanish is monitoring his condition closely. Pt is tolerating his medications well, continue at this time.  5. CAD- Dr. Percival Spanish manages his condition and he sees him every 6 months.  6. Fatigue- In the short time I spent with you folks today and from what you are telling me, it appears this anemia is a relatively new thing in the past 1-2 years. Also, from your historical information, it appears the anemia is new and occurred after various procedures were done on the patient. This would point to the fact that he could have a leak or bleed from one of those procedures. While we will be getting blood work in the near future, I do believe the anemia is real and this warrants follow up with your general surgeons as well as possibly GI to rule out other etiologies for bleeding.  - Also please call your cardiologist and see at what point [place] would require a transfusion due to his CAD and various other cardiovascular conditions. Come in the near future  to get FBW and if the anemia is real, this will continue to be my recommendation.   Follow up with GI.   Pt's last Hgb was 8.1 on 03-29-17 done at Dr. Percival Spanish, cardiologist.   7. H/o smoking 30 years- continue stopping smoking.  8. Anemia- Wife is pressing [place] on this issue. Looking at the records this is not new and has been ongoing for several years. He has had gradual decline in 2.5 years ago. However, explained to the wife that I just met them and do not have medical records from his old PCP. Explained these are important to get so we can understand what old values were to be able to compare to more recent values. ordered anemia panel today. Pt sent home with hemoccult cards today. Pt given ambulatory referral to hematology.   -Obtain FBW, T4,  and B12 in 1 month. Told pt to contact his previous healthcare office and send his medical records to our office for our records.    Education and routine counseling performed. Handouts provided.  Orders Placed This Encounter  Procedures  . Comprehensive metabolic panel  . Hemoglobin A1c  . Lipid panel  . TSH  . VITAMIN D 25 Hydroxy (Vit-D Deficiency, Fractures)  . CBC with Differential/Platelet  . Vitamin B12  . T4, free  . Microalbumin / creatinine urine ratio  . Vitamin B12  . Folate  . Iron and TIBC  . Ferritin  . Transferrin  . Ambulatory referral to Hematology /  Oncology   Gross side effects, risk and benefits, and alternatives of medications discussed with patient.  Patient is aware that all medications have potential side effects and we are unable to predict every side effect or drug-drug interaction that may occur.  Expresses verbal understanding and consents to current therapy plan and treatment regimen.  Return for f/up for chronic OV with me.  Obtain FBW in one month, then OV with me. .  Please see AVS handed out to patient at the end of our visit for further patient instructions/ counseling done pertaining to today's office  visit.    Note: This document was prepared using Dragon voice recognition software and may include unintentional dictation errors.  This document serves as a record of services personally performed by Mellody Dance, DO. It was created on her behalf by Mayer Masker, a trained medical scribe. The creation of this record is based on the scribe's personal observations and the provider's statements to them.   I have reviewed the above medical documentation for accuracy and completeness and I concur.  Mellody Dance 04/19/17 5:02 PM  ------------------------------------------------------------------------------------------------   Subjective:    Chief complaint:   Chief Complaint  Patient presents with  . Establish Care     HPI: [name] [name] is a pleasant 72 y.o. male who presents to [place] at [facility] today to review their medical history with me and establish care.   I asked the patient to review their chronic problem list with me to ensure everything was updated and accurate.    All recent office visits with other providers, any medical records that patient brought in etc  - I reviewed today.     We asked pt to get [place] their medical records from [facility] providers/ specialists that they had seen within the past 3-5 years- if they are in private practice and/or do not work for [employer], [facility], [place], [place] or [employer] owned practice.  Told them to call their specialists to clarify this if they are not sure.   Previous/other providers: [facility]: Dr. Ashby Dawes is his previous PCP. He is an Administrator, Civil Service. That office has not sent lab results and records to the office. His wife states the office would not discuss lab results with them, which they prefer to talk about in person. He wants a new PCP because "he does not do anything".   Dr. Allyn Kenner, dermatologist. Bowen's disease on chest, which he had frozen off.  Dr. Percival Spanish, cardiology.  Clydell Hakim,  pain management, for chronic pain.  PMHx: Pt has an extensive medical history including many comorbidities and several surgeries.  -Pt was a smoker for 30 years, but has not smoked for 30-40 years. He was 1 pack/day then.  -DM: last A1c 6.5 12-2016. He checks his BP at home, they are around 105. He is on metformin. He does not eat a lot and does not exercise. He had another appointment in March 2019 for A1c recheck.  -Chronic pain: Dr Maryjean Ka manages his chronic pain. Pt has had two effusions of his neck, scoliosis. He has lost 3 inches in his height. He also has a h/o several accidents. He has had other procedures done on his back. He is now not qualified for surgery.  -CHF: Dr. Percival Spanish, cardiologist, gives him lasix. He does not cough or wheeze. His symptoms are improved.   -MI 2001. He has had two catheterizations, last one in 2013. He sees Dr. Percival Spanish every 6 months. Stable  at this time, he denies CP.   -Fatigue and anemia- Dr Percival Spanish ordered a CBC and his Hgb is 8.1. His wife states his hgb has been historically low after a surgery 8 months ago. He has been falling recently and appearing weak.   -Esophagus problem: it "goes into convulsions", especially if he eats something and it "doesn't go down right, and it will react as if it's a heart attack, but his heart is fine". It does not get caused by exertion. He does not have a GI doctor. He has had an upper and lower endoscopy in 08-2012 with [facility]. It was with Dr. Penelope Coop. He had negative results.   -Anal fistula- he has not had a rectal exam recently.  PSHx: 3 major abdominal surgeries. (He has a h/o diverticulitis and colostomy), including abdominal hernia repair. He has had two effusions of his neck Back surgery.   Wt Readings from Last 3 Encounters:  04/07/17 229 lb 3.2 oz (104 kg)  04/01/17 230 lb 9.6 oz (104.6 kg)  03/29/17 230 lb (104.3 kg)   BP Readings from Last 3 Encounters:  04/07/17 (!) 144/73  04/01/17 128/74    03/29/17 116/74   Pulse Readings from Last 3 Encounters:  04/07/17 75  04/01/17 65  03/29/17 65   BMI Readings from Last 3 Encounters:  04/07/17 32.89 kg/m  04/01/17 33.09 kg/m  03/29/17 33.00 kg/m    Patient Care Team    Relationship Specialty Notifications Start End  Mellody Dance, DO PCP - General Family Medicine  03/30/17   Minus Breeding, MD Consulting Physician Cardiology  04/01/17   Dorna Leitz, MD Consulting Physician Orthopedic Surgery  04/01/17   Idamae Lusher, MD  Ophthalmology  04/01/17   Clydell Hakim, MD Consulting Physician Anesthesiology  04/01/17    Comment: pain mgt-  chronic pain syn.   Allyn Kenner, MD  Dermatology  04/01/17    Comment: bowen's disease on chest- cryo txmnt  Ralene Ok, MD Consulting Physician General Surgery  04/01/17   Fanny Skates, MD Consulting Physician General Surgery  04/01/17   Georganna Skeans, MD Consulting Physician General Surgery  04/01/17   Wonda Horner, MD Consulting Physician Gastroenterology  04/01/17     Patient Active Problem List   Diagnosis Date Noted  . Mixed diabetic hyperlipidemia associated with type 2 diabetes mellitus ([place]) 04/01/2017    Priority: High  . Type 2 diabetes mellitus with complication (HCC)     Priority: High  . CHF (congestive heart failure) ([place]) 04/01/2017    Priority: Medium  . CAD (coronary artery disease)     Priority: Medium  . Anemia 04/07/2017  . Hypertension associated with diabetes ([place]) 04/01/2017  . Fatigue 04/01/2017  . History of smoking 30 or more pack years 04/01/2017  . S/P hernia repair 07/27/2016  . Preop cardiovascular exam 05/30/2016  . S/P colostomy takedown 11/05/2015  . Abdominal pain   . Agitation   . Threatening to others   . Diverticulitis of large intestine with perforation without bleeding   . CAD in native artery   . Diverticulitis 04/26/2015  . Perforated diverticulum of large intestine 04/26/2015  . Diverticulitis of colon with perforation  04/26/2015  . Depression 04/26/2015  . GERD (gastroesophageal reflux disease) 04/26/2015  . Hypothyroidism 04/26/2015  . Acute encephalopathy 08/25/2014  . Acute on chronic renal failure ([place]) 08/25/2014  . Blood poisoning   . Elevated troponin   . CAP (community acquired pneumonia) 08/24/2014  . Sepsis ([place]) 08/24/2014  .  Hypokalemia 08/24/2014  . AKI (acute kidney injury) ([employer]) 08/24/2014  . Alcohol abuse 08/24/2014  . DM (diabetes mellitus), type 2 ([place]) 02/28/2013  . Abnormal EKG 02/28/2013  . Fistula-in-ano 11/05/2011  . Hyperlipidemia 02/06/2011  . Insomnia 02/06/2011  . Hypertension      Past Medical History:  Diagnosis Date  . Anal fistula   . Anemia   . Anxiety    panic attacks  . Arthritis   . CAD (coronary artery disease)    a. s/p CABG 2001; b. LHC (3/09):  S-RCA, L-LAD, S-OM1/dCFX all patent, EF 55-60%;  c. myoview (12/12):  no ischemia, EF 68%;  d. Dob Echo (1/14):  + ECG changes but normal echo => med Rx cont'd  . [facility])    pt denies  . CHF (congestive heart failure) ([place])   . Complication of anesthesia    CONFUSION, psychosis after surgery for a week  . DDD (degenerative disc disease), lumbar    cervical thoracic and lumbar  . Diabetes mellitus without complication ([place])   . Diverticulitis   . GERD (gastroesophageal reflux disease)   . Gout   . Gout   . History of pneumonia    septic pneumonia  . Hyperlipidemia   . Hypertension   . Hypothyroidism   . IBS (irritable bowel syndrome)   . Myocardial infarction ([place])   . Osteoporosis   . PTSD (post-traumatic stress disorder)   . PTSD (post-traumatic stress disorder)   . Scoliosis   . Spinal stenosis      Past Medical History:  Diagnosis Date  . Anal fistula   . Anemia   . Anxiety    panic attacks  . Arthritis   . CAD (coronary artery disease)    a. s/p CABG 2001; b. LHC (3/09):  S-RCA, L-LAD, S-OM1/dCFX all patent, EF 55-60%;  c. myoview (12/12):  no ischemia, EF 68%;  d. Dob Echo  (1/14):  + ECG changes but normal echo => med Rx cont'd  . [facility])    pt denies  . CHF (congestive heart failure) ([place])   . Complication of anesthesia    CONFUSION, psychosis after surgery for a week  . DDD (degenerative disc disease), lumbar    cervical thoracic and lumbar  . Diabetes mellitus without complication ([place])   . Diverticulitis   . GERD (gastroesophageal reflux disease)   . Gout   . Gout   . History of pneumonia    septic pneumonia  . Hyperlipidemia   . Hypertension   . Hypothyroidism   . IBS (irritable bowel syndrome)   . Myocardial infarction ([place])   . Osteoporosis   . PTSD (post-traumatic stress disorder)   . PTSD (post-traumatic stress disorder)   . Scoliosis   . Spinal stenosis      Past Surgical History:  Procedure Laterality Date  . APPENDECTOMY    . BLEPHAROPLASTY Bilateral   . CERVICAL FUSION     times 2  . CHOLECYSTECTOMY    . COLOSTOMY REVERSAL  11/05/2015  . COLOSTOMY TAKEDOWN N/A 11/05/2015   Procedure: COLOSTOMY TAKEDOWN;  Surgeon: Georganna Skeans, MD;  Location: [facility];  Service: General;  Laterality: N/A;  . CORONARY ARTERY BYPASS GRAFT     2001 LIMA LAD, SVG OM/Circ dista, SVG PDA.  Cath 2009 with patent grafts  . HAND SURGERY Right   . HIP ARTHROPLASTY Right 03/01/2013   Procedure: ARTHROPLASTY BIPOLAR HIP;  Surgeon: Alta Corning, MD;  Location: [facility];  Service: Orthopedics;  Laterality: Right;  . INCISIONAL HERNIA REPAIR N/A 11/05/2015   Procedure: REPAIR INCISIONAL HERNIA;  Surgeon: Georganna Skeans, MD;  Location: [facility];  Service: General;  Laterality: N/A;  . INCISIONAL HERNIA REPAIR N/A 07/27/2016   Procedure: HERNIA REPAIR INCISIONAL;  Surgeon: Ralene Ok, MD;  Location: [facility];  Service: General;  Laterality: N/A;  . INSERTION OF MESH N/A 07/27/2016   Procedure: INSERTION OF MESH;  Surgeon: Ralene Ok, MD;  Location: [facility];  Service: General;  Laterality: N/A;  . JOINT REPLACEMENT     Right knee  . KNEE ARTHROSCOPY  Bilateral   . LAPAROTOMY N/A 07/27/2016   Procedure: EXPLORATORY LAPAROTOMY;  Surgeon: Ralene Ok, MD;  Location: [facility];  Service: General;  Laterality: N/A;  . LYSIS OF ADHESION N/A 07/27/2016   Procedure: LYSIS OF ADHESION;  Surgeon: Ralene Ok, MD;  Location: [facility];  Service: General;  Laterality: N/A;  . PARTIAL COLECTOMY N/A 04/26/2015   Procedure: EXPLORATORY LAPAROTOMY PARTIAL COLECTOMY WITH COLOSTOMY AND HARTMANN'S;  Surgeon: Georganna Skeans, MD;  Location: [facility];  Service: General;  Laterality: N/A;  . PLACEMENT OF SETON     seton stitch  . STERNAL WIRE REMOVAL    . TREATMENT FISTULA ANAL     times 3     Family History  Problem Relation Age of Onset  . Colon cancer Mother   . Heart disease Father   . Heart disease Sister   . Heart disease Brother   . Diabetes Sister   . Heart disease Brother   . Heart disease Brother      Social History   Substance and Sexual Activity  Drug Use No   Comment: pt denies     Social History   Substance and Sexual Activity  Alcohol Use No   Comment: over a year      Social History   Tobacco Use  Smoking Status Former Smoker  . Packs/day: 1.50  . Years: 30.00  . Pack years: 45.00  . Types: Cigarettes  . Last attempt to quit: 02/17/1976  . Years since quitting: 41.1  Smokeless Tobacco Never Used     Current Meds  Medication Sig  . allopurinol (ZYLOPRIM) 300 MG tablet Take 300 mg by mouth at bedtime.   Marland Kitchen aspirin EC 325 MG tablet Take 1 tablet (325 mg total) by mouth 2 (two) times daily after a meal. Take x 1 month post op. (Patient taking differently: Take 325 mg by mouth daily with breakfast. )  . B Complex-C (B-COMPLEX WITH VITAMIN C) tablet Take 1 tablet by mouth daily with breakfast.   . calcium carbonate (TUMS - DOSED IN MG ELEMENTAL CALCIUM) 500 MG chewable tablet Chew 1-3 tablets by mouth 3 (three) times daily as needed for indigestion or heartburn.  . cholecalciferol (VITAMIN D) 1000 units tablet Take  3,000 Units by mouth daily.  . clonazePAM (KLONOPIN) 1 MG tablet Take 1 mg by mouth 3 (three) times daily as needed for anxiety.   . docusate sodium (COLACE) 100 MG capsule Take 100 mg by mouth 4 (four) times daily as needed (for constipation).  . furosemide (LASIX) 20 MG tablet Take a 20 mg tablet daily or as directed  . gabapentin (NEURONTIN) 300 MG capsule Take 900 mg by mouth at bedtime.   . Guaifenesin (MUCINEX MAXIMUM STRENGTH) 1200 MG TB12 Take 1 tablet by mouth 2 (two) times daily.  Marland Kitchen HYDROcodone-acetaminophen (NORCO) 10-325 MG tablet Take 1 tablet by mouth every 6 (six) hours as needed (  for pain).   . hydrocortisone cream 1 % Apply 1 application topically daily as needed for itching (for feet).  Marland Kitchen HYDROmorphone (DILAUDID) 2 MG tablet Take 2 mg by mouth every 12 (twelve) hours as needed for severe pain.  Marland Kitchen levothyroxine (SYNTHROID, LEVOTHROID) 75 MCG tablet Take 75 mcg by mouth daily before breakfast.   . losartan (COZAAR) 50 MG tablet Take 1 tablet by mouth daily.  . Magnesium 500 MG TABS Take 500 mg by mouth daily with breakfast.  . Melatonin 5 MG CAPS Take 1 capsule by mouth daily.  . metFORMIN (GLUCOPHAGE-XR) 500 MG 24 hr tablet Take 1,000 mg by mouth at bedtime.  . metoprolol (LOPRESSOR) 100 MG tablet Take 100 mg by mouth 2 (two) times daily.  Marland Kitchen omeprazole (PRILOSEC) 40 MG capsule Take 40 mg by mouth daily with breakfast.   . Polyethyl Glycol-Propyl Glycol (SYSTANE OP) Apply to eye.  . potassium gluconate 595 (99 K) MG TABS tablet Take 595 mg by mouth daily with breakfast.   . Probiotic Product (FLORAJEN3 PO) Take 1 capsule by mouth daily with breakfast.  . rOPINIRole (REQUIP) 0.5 MG tablet Take 1 tablet by mouth daily.  Marland Kitchen venlafaxine XR (EFFEXOR-XR) 75 MG 24 hr capsule Take 225 mg by mouth at bedtime.  . [DISCONTINUED] Cholecalciferol (VITAMIN D3) 5000 units TABS Take 5,000 Units by mouth.  . [DISCONTINUED] nitroGLYCERIN (NITROSTAT) 0.4 MG SL tablet Place 1 tablet (0.4 mg total)  every 5 (five) minutes as needed under the tongue. (Patient not taking: Reported on 04/07/2017)    Allergies: Ciprofloxacin hcl; Fentanyl; and Flomax [tamsulosin hcl]   Review of Systems  Constitutional: Negative for chills, diaphoresis, fever, malaise/fatigue and weight loss.  HENT: Negative for congestion, sore throat and tinnitus.   Eyes: Negative for blurred vision, double vision and photophobia.  Respiratory: Negative for cough and wheezing.   Cardiovascular: Negative for chest pain and palpitations.  Gastrointestinal: Negative for blood in stool, diarrhea, nausea and vomiting.  Genitourinary: Negative for dysuria, frequency and urgency.  Musculoskeletal: Negative for joint pain and myalgias.  Skin: Negative for itching and rash.  Neurological: Negative for dizziness, focal weakness, weakness and headaches.  Endo/Heme/Allergies: Negative for environmental allergies and polydipsia. Does not bruise/bleed easily.  Psychiatric/Behavioral: Negative for depression and memory loss. The patient is not nervous/anxious and does not have insomnia.      Objective:   Blood pressure 128/74, pulse 65, height _0  (1.778 m), weight 230 lb 9.6 oz (104.6 kg), SpO2 100 %. Body mass index is 33.09 kg/m. General: Well Developed, well nourished, and in no acute distress.  Neuro: Alert and oriented x3, extra-ocular muscles intact, sensation grossly intact.  HEENT:[place]/AT, PERRLA, neck supple, No carotid bruits Skin: no gross rashes  Cardiac: Regular rate and rhythm Respiratory: Essentially clear to auscultation bilaterally. Not using accessory muscles, speaking in full sentences.  Abdominal: not grossly distended Musculoskeletal: Ambulates w/o diff, FROM * 4 ext.  Vasc: less 2 sec cap RF, warm and pink  Psych:  No HI/SI, judgement and insight good, Euthymic mood. Full Affect.    Recent Results (from the past 2160 hour(s))  CBC     Status: Abnormal   Collection Time: 03/29/17  2:56 PM  Result  Value Ref Range   WBC 8.1 3.4 - 10.8 x10E3/uL   RBC 4.11 (L) 4.14 - 5.80 x10E6/uL   Hemoglobin 8.1 (L) 13.0 - 17.7 g/dL   Hematocrit 29.4 (L) 37.5 - 51.0 %   MCV 72 (L) 79 - 97 fL  MCH 19.7 (L) 26.6 - 33.0 pg   MCHC 27.6 (L) 31.5 - 35.7 g/dL   RDW 17.4 (H) 12.3 - 15.4 %   Platelets 253 150 - 379 x10E3/uL  TSH     Status: None   Collection Time: 03/29/17  2:56 PM  Result Value Ref Range   TSH 3.490 0.450 - 4.500 uIU/mL  Basic Metabolic Panel (BMET)     Status: Abnormal   Collection Time: 03/29/17  2:56 PM  Result Value Ref Range   Glucose 76 65 - 99 mg/dL   BUN 15 8 - 27 mg/dL   Creatinine, Ser 1.15 0.76 - 1.27 mg/dL   GFR calc non Af Amer 63 >59 mL/min/1.73   GFR calc Af Amer 73 >59 mL/min/1.73   BUN/Creatinine Ratio 13 10 - 24   Sodium 139 134 - 144 mmol/L   Potassium 4.6 3.5 - 5.2 mmol/L   Chloride 96 96 - 106 mmol/L   CO2 30 (H) 20 - 29 mmol/L   Calcium 9.3 8.6 - 10.2 mg/dL  IFOBT POC (occult bld, rslt in office)     Status: Normal   Collection Time: 04/01/17 12:00 AM  Result Value Ref Range   IFOBT Negative   IFOBT POC (occult bld, rslt in office)     Status: Normal   Collection Time: 04/02/17 12:00 AM  Result Value Ref Range   IFOBT Negative   IFOBT POC (occult bld, rslt in office)     Status: Normal   Collection Time: 04/05/17 12:00 AM  Result Value Ref Range   IFOBT Negative   CBC with Differential (Cancer Center Only)     Status: Abnormal   Collection Time: 04/07/17  2:37 PM  Result Value Ref Range   WBC Count 9.2 4.0 - 10.3 K/uL   RBC 4.37 4.20 - 5.82 MIL/uL   Hemoglobin 8.7 (L) 13.0 - 17.1 g/dL   HCT 32.2 (L) 38.4 - 49.9 %   MCV 73.7 (L) 79.3 - 98.0 fL   MCH 19.9 (L) 27.2 - 33.4 pg   MCHC 27.0 (L) 32.0 - 36.0 g/dL   RDW 17.8 (H) 11.0 - 14.6 %   Platelet Count 259 140 - 400 K/uL   Neutrophils Relative % 50 %   Neutro Abs 4.6 1.5 - 6.5 K/uL   Lymphocytes Relative 40 %   Lymphs Abs 3.7 (H) 0.9 - 3.3 K/uL   Monocytes Relative 7 %   Monocytes Absolute  0.7 0.1 - 0.9 K/uL   Eosinophils Relative 2 %   Eosinophils Absolute 0.2 0.0 - 0.5 K/uL   Basophils Relative 1 %   Basophils Absolute 0.1 0.0 - 0.1 K/uL    Comment: Performed at [facility], [address], [place], [place] [address]  CMP ([place] only)     Status: Abnormal   Collection Time: 04/07/17  2:37 PM  Result Value Ref Range   Sodium 139 136 - 145 mmol/L   Potassium 3.9 3.5 - 5.1 mmol/L   Chloride 98 98 - 109 mmol/L   CO2 31 (H) 22 - 29 mmol/L   Glucose, Bld 92 70 - 140 mg/dL   BUN 16 7 - 26 mg/dL   Creatinine 1.18 0.70 - 1.30 mg/dL   Calcium 9.6 8.4 - 10.4 mg/dL   Total Protein 7.2 6.4 - 8.3 g/dL   Albumin 4.0 3.5 - 5.0 g/dL   AST 11 5 - 34 U/L   ALT 12 0 - 55 U/L   Alkaline  Phosphatase 77 40 - 150 U/L   Total Bilirubin 0.3 0.2 - 1.2 mg/dL   GFR, Est Non Af Am 60 (L) >60 mL/min   GFR, Est AFR Am >60 >60 mL/min    Comment: (NOTE) The eGFR has been calculated using the CKD EPI equation. This calculation has not been validated in all clinical situations. eGFR's persistently <60 mL/min signify possible Chronic Kidney Disease.    Anion gap 10 3 - 11    Comment: Performed at [facility], [address] [place], [place] [address]  Erythropoietin     Status: Abnormal   Collection Time: 04/07/17  2:37 PM  Result Value Ref Range   Erythropoietin 83.4 (H) 2.6 - 18.5 mIU/mL    Comment: (NOTE) [employer] Coul[employer] UniCel DxI 800 Immunoassay System Values obtained with different assay methods or kits cannot be used interchangeably. Results cannot be interpreted as absolute evidence of the presence or absence of malignant disease. Performed At: [facility], [place] [id number] Rush Farmer MD [id number] Performed at [facility], [place] [address], [place], [place] [address]   Ferritin     Status: Abnormal   Collection Time: 04/07/17  2:37 PM  Result Value Ref  Range   Ferritin 8 (L) 24 - 336 ng/mL    Comment: Performed at [facility], [place] [address] [place], [place] [address]  Iron and TIBC     Status: Abnormal   Collection Time: 04/07/17  2:37 PM  Result Value Ref Range   Iron 13 (L) 45 - 182 ug/dL   TIBC 462 (H) 250 - 450 ug/dL   Saturation Ratios 3 (L) 17.9 - 39.5 %   UIBC 449 ug/dL    Comment: Performed at [facility], [place] [address], [place], [place] [address]  Multiple Myeloma Panel (SPEP&IFE w/QIG)     Status: None   Collection Time: 04/07/17  2:37 PM  Result Value Ref Range   IgG (Immunoglobin G), Serum 952 700 - 1,600 mg/dL   IgA 296 61 - 437 mg/dL   IgM (Immunoglobulin M), Srm 81 15 - 143 mg/dL   Total Protein ELP 7.1 6.0 - 8.5 g/dL   Albumin SerPl Elph-Mcnc 4.0 2.9 - 4.4 g/dL   Alpha 1 0.2 0.0 - 0.4 g/dL   Alpha2 Glob SerPl Elph-Mcnc 0.8 0.4 - 1.0 g/dL   B-Globulin SerPl Elph-Mcnc 1.2 0.7 - 1.3 g/dL   Gamma Glob SerPl Elph-Mcnc 0.9 0.4 - 1.8 g/dL   M Protein SerPl Elph-Mcnc Not Observed Not Observed g/dL   Globulin, Total 3.1 2.2 - 3.9 g/dL   Albumin/Glob SerPl 1.3 0.7 - 1.7   IFE 1 Comment     Comment: An apparent normal immunofixation pattern.   Please Note Comment     Comment: (NOTE) Protein electrophoresis scan will follow via computer, mail, or courier delivery. Performed At: [facility] [place], [place] [id number] Rush Farmer MD [id number] Performed at [facility], [place] [address] [place], [place] [address]   Vitamin B12     Status: Abnormal   Collection Time: 04/07/17  2:37 PM  Result Value Ref Range   Vitamin B-12 952 (H) 180 - 914 pg/mL    Comment: (NOTE) This assay is not validated for testing neonatal or myeloproliferative syndrome specimens for Vitamin B12 levels. Performed at [facility], [place] [address] [place], [place] [address]

## 2017-04-01 NOTE — Assessment & Plan Note (Signed)
Quit [phone]

## 2017-04-05 ENCOUNTER — Telehealth: Payer: Self-pay | Admitting: Oncology

## 2017-04-05 NOTE — Telephone Encounter (Signed)
Pt has been scheduled to see Dr. [name] on 2/20 at 2pm. Pt aware to arrive 30 minutes early.

## 2017-04-06 ENCOUNTER — Other Ambulatory Visit (INDEPENDENT_AMBULATORY_CARE_PROVIDER_SITE_OTHER): Payer: Medicare Other

## 2017-04-06 DIAGNOSIS — Z1211 Encounter for screening for malignant neoplasm of colon: Secondary | ICD-10-CM

## 2017-04-06 NOTE — Progress Notes (Signed)
Patient brought in stool cards. MPulliam, CMA/RT(R)'

## 2017-04-06 NOTE — Addendum Note (Signed)
Addended by: Lanier Prude D on: 04/06/2017 05:24 PM   Modules accepted: Orders

## 2017-04-07 ENCOUNTER — Telehealth: Payer: Self-pay | Admitting: Oncology

## 2017-04-07 ENCOUNTER — Inpatient Hospital Stay: Payer: Medicare Other | Attending: Oncology | Admitting: Oncology

## 2017-04-07 ENCOUNTER — Inpatient Hospital Stay: Payer: Medicare Other

## 2017-04-07 VITALS — BP 144/73 | HR 75 | Temp 97.8°F | Resp 18 | Ht 70.0 in | Wt 229.2 lb

## 2017-04-07 DIAGNOSIS — I2510 Atherosclerotic heart disease of native coronary artery without angina pectoris: Secondary | ICD-10-CM

## 2017-04-07 DIAGNOSIS — Z87891 Personal history of nicotine dependence: Secondary | ICD-10-CM | POA: Insufficient documentation

## 2017-04-07 DIAGNOSIS — Z800 Family history of malignant neoplasm of digestive organs: Secondary | ICD-10-CM | POA: Diagnosis not present

## 2017-04-07 DIAGNOSIS — D649 Anemia, unspecified: Secondary | ICD-10-CM

## 2017-04-07 DIAGNOSIS — D509 Iron deficiency anemia, unspecified: Secondary | ICD-10-CM

## 2017-04-07 DIAGNOSIS — I509 Heart failure, unspecified: Secondary | ICD-10-CM | POA: Diagnosis not present

## 2017-04-07 LAB — CBC WITH DIFFERENTIAL (CANCER CENTER ONLY)
BASOS PCT: 1 %
Basophils Absolute: 0.1 10*3/uL (ref 0.0–0.1)
EOS ABS: 0.2 10*3/uL (ref 0.0–0.5)
EOS PCT: 2 %
HCT: 32.2 % — ABNORMAL LOW (ref 38.4–49.9)
Hemoglobin: 8.7 g/dL — ABNORMAL LOW (ref 13.0–17.1)
LYMPHS ABS: 3.7 10*3/uL — AB (ref 0.9–3.3)
Lymphocytes Relative: 40 %
MCH: 19.9 pg — AB (ref 27.2–33.4)
MCHC: 27 g/dL — AB (ref 32.0–36.0)
MCV: 73.7 fL — ABNORMAL LOW (ref 79.3–98.0)
MONOS PCT: 7 %
Monocytes Absolute: 0.7 10*3/uL (ref 0.1–0.9)
Neutro Abs: 4.6 10*3/uL (ref 1.5–6.5)
Neutrophils Relative %: 50 %
Platelet Count: 259 10*3/uL (ref 140–400)
RBC: 4.37 MIL/uL (ref 4.20–5.82)
RDW: 17.8 % — ABNORMAL HIGH (ref 11.0–14.6)
WBC: 9.2 10*3/uL (ref 4.0–10.3)

## 2017-04-07 LAB — IFOBT (OCCULT BLOOD)
IFOBT: NEGATIVE
IFOBT: NEGATIVE
IFOBT: NEGATIVE

## 2017-04-07 LAB — CMP (CANCER CENTER ONLY)
ALBUMIN: 4 g/dL (ref 3.5–5.0)
ALT: 12 U/L (ref 0–55)
ANION GAP: 10 (ref 3–11)
AST: 11 U/L (ref 5–34)
Alkaline Phosphatase: 77 U/L (ref 40–150)
BUN: 16 mg/dL (ref 7–26)
CHLORIDE: 98 mmol/L (ref 98–109)
CO2: 31 mmol/L — AB (ref 22–29)
Calcium: 9.6 mg/dL (ref 8.4–10.4)
Creatinine: 1.18 mg/dL (ref 0.70–1.30)
GFR, Estimated: 60 mL/min — ABNORMAL LOW (ref >60)
Glucose, Bld: 92 mg/dL (ref 70–140)
Potassium: 3.9 mmol/L (ref 3.5–5.1)
SODIUM: 139 mmol/L (ref 136–145)
Total Bilirubin: 0.3 mg/dL (ref 0.2–1.2)
Total Protein: 7.2 g/dL (ref 6.4–8.3)

## 2017-04-07 LAB — VITAMIN B12: VITAMIN B 12: 952 pg/mL — AB (ref 180–914)

## 2017-04-07 LAB — IRON AND TIBC
IRON: 13 ug/dL — AB (ref 45–182)
Saturation Ratios: 3 % — ABNORMAL LOW (ref 17.9–39.5)
TIBC: 462 ug/dL — AB (ref 250–450)
UIBC: 449 ug/dL

## 2017-04-07 LAB — FERRITIN: Ferritin: 8 ng/mL — ABNORMAL LOW (ref 24–336)

## 2017-04-07 NOTE — Telephone Encounter (Signed)
Scheduled appt per 2/20 los - per FS okay to schedule appts for Iron out two weeks due to patient availability and treatment availability. - Gave patient AVS and calender per los.

## 2017-04-07 NOTE — Progress Notes (Signed)
Reason for Referral: Anemia.  HPI: 72 year old gentleman native of [place] but currently resides in [place], [place].  He has history of coronary artery disease, congestive heart failure as well as degenerative arthritis.  He has reported symptoms of excessive fatigue and tiredness and was evaluated by his cardiologist in February 2019.  CBC at that time showed a hemoglobin of 8.1, white cell count of 8.1 and a platelet count of 253.  His MCV was 72 with RDW of 17.4 that is elevated.  Previous hemoglobin in June 2018 was 8.3.  His hemoglobin was close to normal range and 2017 ranging between 12 and 12.8.  He was started on iron replacement therapy orally in the last few days.  He does report diffuse bone pain and back pain that has been chronic in nature and is evaluated at the pain clinic and prescribed hydrocodone.  He denied any recent pathological fractures or bone pain.  He denies any hematochezia or melena.  His   He does not report any headaches, blurry vision, syncope or seizures. Does not report any fevers, chills or sweats.  Does not report any cough, wheezing or hemoptysis.  Does not report any chest pain, palpitation, orthopnea or leg edema.    He does report dyspnea on exertion.  Does not report any nausea, vomiting or abdominal pain.  She does not report any constipation or diarrhea.  Does not report any skeletal complaints.    Does not report frequency, urgency or hematuria.  Does not report any skin rashes or lesions. Does not report any heat or cold intolerance.  Does not report any lymphadenopathy or petechiae.  Does not report any anxiety or depression.  Remaining review of systems is negative.    Past Medical History:  Diagnosis Date  . Anal fistula   . Anemia   . Anxiety    panic attacks  . Arthritis   . CAD (coronary artery disease)    a. s/p CABG 2001; b. LHC (3/09):  S-RCA, L-LAD, S-OM1/dCFX all patent, EF 55-60%;  c. myoview (12/12):  no ischemia, EF 68%;  d. Dob Echo  (1/14):  + ECG changes but normal echo => med Rx cont'd  . Cancer [facility])    pt denies  . CHF (congestive heart failure) ([place])   . Complication of anesthesia    CONFUSION, psychosis after surgery for a week  . DDD (degenerative disc disease), lumbar    cervical thoracic and lumbar  . Diabetes mellitus without complication ([place])   . Diverticulitis   . GERD (gastroesophageal reflux disease)   . Gout   . Gout   . History of pneumonia    septic pneumonia  . Hyperlipidemia   . Hypertension   . Hypothyroidism   . IBS (irritable bowel syndrome)   . Myocardial infarction ([place])   . Osteoporosis   . PTSD (post-traumatic stress disorder)   . PTSD (post-traumatic stress disorder)   . Scoliosis   . Spinal stenosis   :  Past Surgical History:  Procedure Laterality Date  . APPENDECTOMY    . BLEPHAROPLASTY Bilateral   . CERVICAL FUSION     times 2  . CHOLECYSTECTOMY    . COLOSTOMY REVERSAL  11/05/2015  . COLOSTOMY TAKEDOWN N/A 11/05/2015   Procedure: COLOSTOMY TAKEDOWN;  Surgeon: Georganna Skeans, MD;  Location: [place];  Service: General;  Laterality: N/A;  . CORONARY ARTERY BYPASS GRAFT     2001 LIMA LAD, SVG OM/Circ dista, SVG PDA.  Cath 2009 with patent  grafts  . HAND SURGERY Right   . HIP ARTHROPLASTY Right 03/01/2013   Procedure: ARTHROPLASTY BIPOLAR HIP;  Surgeon: Alta Corning, MD;  Location: [facility];  Service: Orthopedics;  Laterality: Right;  . INCISIONAL HERNIA REPAIR N/A 11/05/2015   Procedure: REPAIR INCISIONAL HERNIA;  Surgeon: Georganna Skeans, MD;  Location: [place];  Service: General;  Laterality: N/A;  . INCISIONAL HERNIA REPAIR N/A 07/27/2016   Procedure: HERNIA REPAIR INCISIONAL;  Surgeon: Ralene Ok, MD;  Location: [place];  Service: General;  Laterality: N/A;  . INSERTION OF MESH N/A 07/27/2016   Procedure: INSERTION OF MESH;  Surgeon: Ralene Ok, MD;  Location: [place];  Service: General;  Laterality: N/A;  . JOINT REPLACEMENT     Right knee  . KNEE ARTHROSCOPY  Bilateral   . LAPAROTOMY N/A 07/27/2016   Procedure: EXPLORATORY LAPAROTOMY;  Surgeon: Ralene Ok, MD;  Location: [place];  Service: General;  Laterality: N/A;  . LYSIS OF ADHESION N/A 07/27/2016   Procedure: LYSIS OF ADHESION;  Surgeon: Ralene Ok, MD;  Location: [place];  Service: General;  Laterality: N/A;  . PARTIAL COLECTOMY N/A 04/26/2015   Procedure: EXPLORATORY LAPAROTOMY PARTIAL COLECTOMY WITH COLOSTOMY AND HARTMANN'S;  Surgeon: Georganna Skeans, MD;  Location: [place];  Service: General;  Laterality: N/A;  . PLACEMENT OF SETON     seton stitch  . STERNAL WIRE REMOVAL    . TREATMENT FISTULA ANAL     times 3  :   Current Outpatient Medications:  .  allopurinol (ZYLOPRIM) 300 MG tablet, Take 300 mg by mouth at bedtime. , Disp: , Rfl:  .  aspirin EC 325 MG tablet, Take 1 tablet (325 mg total) by mouth 2 (two) times daily after a meal. Take x 1 month post op. (Patient taking differently: Take 325 mg by mouth daily with breakfast. ), Disp: 60 tablet, Rfl: 0 .  B Complex-C (B-COMPLEX WITH VITAMIN C) tablet, Take 1 tablet by mouth daily with breakfast. , Disp: , Rfl:  .  calcium carbonate (TUMS - DOSED IN MG ELEMENTAL CALCIUM) 500 MG chewable tablet, Chew 1-3 tablets by mouth 3 (three) times daily as needed for indigestion or heartburn., Disp: , Rfl:  .  cholecalciferol (VITAMIN D) 1000 units tablet, Take 3,000 Units by mouth daily., Disp: , Rfl:  .  clonazePAM (KLONOPIN) 1 MG tablet, Take 1 mg by mouth 3 (three) times daily as needed for anxiety. , Disp: , Rfl:  .  docusate sodium (COLACE) 100 MG capsule, Take 100 mg by mouth 4 (four) times daily as needed (for constipation)., Disp: , Rfl:  .  furosemide (LASIX) 20 MG tablet, Take a 20 mg tablet daily or as directed, Disp: 90 tablet, Rfl: 3 .  gabapentin (NEURONTIN) 300 MG capsule, Take 900 mg by mouth at bedtime. , Disp: , Rfl:  .  Guaifenesin (MUCINEX MAXIMUM STRENGTH) 1200 MG TB12, Take 1 tablet by mouth 2 (two) times daily., Disp: ,  Rfl:  .  HYDROcodone-acetaminophen (NORCO) 10-325 MG tablet, Take 1 tablet by mouth every 6 (six) hours as needed (for pain). , Disp: , Rfl: 0 .  hydrocortisone cream 1 %, Apply 1 application topically daily as needed for itching (for feet)., Disp: , Rfl:  .  HYDROmorphone (DILAUDID) 2 MG tablet, Take 2 mg by mouth every 12 (twelve) hours as needed for severe pain., Disp: , Rfl:  .  levothyroxine (SYNTHROID, LEVOTHROID) 75 MCG tablet, Take 75 mcg by mouth daily before breakfast. , Disp: , Rfl:  3 .  losartan (COZAAR) 50 MG tablet, Take 1 tablet by mouth daily., Disp: , Rfl: 6 .  Magnesium 500 MG TABS, Take 500 mg by mouth daily with breakfast., Disp: , Rfl:  .  Melatonin 5 MG CAPS, Take 1 capsule by mouth daily., Disp: , Rfl:  .  metFORMIN (GLUCOPHAGE-XR) 500 MG 24 hr tablet, Take 1,000 mg by mouth at bedtime., Disp: , Rfl: 1 .  metoprolol (LOPRESSOR) 100 MG tablet, Take 100 mg by mouth 2 (two) times daily., Disp: , Rfl: 0 .  nitroGLYCERIN (NITROSTAT) 0.4 MG SL tablet, Place 1 tablet (0.4 mg total) every 5 (five) minutes as needed under the tongue., Disp: 25 tablet, Rfl: 2 .  omeprazole (PRILOSEC) 40 MG capsule, Take 40 mg by mouth daily with breakfast. , Disp: , Rfl: 13 .  Polyethyl Glycol-Propyl Glycol (SYSTANE OP), Apply to eye., Disp: , Rfl:  .  potassium gluconate 595 (99 K) MG TABS tablet, Take 595 mg by mouth daily with breakfast. , Disp: , Rfl:  .  Probiotic Product (FLORAJEN3 PO), Take 1 capsule by mouth daily with breakfast., Disp: , Rfl:  .  rOPINIRole (REQUIP) 0.5 MG tablet, Take 1 tablet by mouth daily., Disp: , Rfl: 2 .  venlafaxine XR (EFFEXOR-XR) 75 MG 24 hr capsule, Take 225 mg by mouth at bedtime., Disp: , Rfl: 3:  Allergies  Allergen Reactions  . Ciprofloxacin Hcl Other (See Comments)    Causes pain in tendons, feels like muscle spasms. BLACK BOX WARNING RE: POSSIBLE TENDON RUPTURE, MYALGIAS, and OTHERS  . Fentanyl Other (See Comments)    Pt can handle having Fentanyl under  anesthesia - does not tolerate after surgery for post op pain - causes mood swings, confusion, and agitation    :  Family History  Problem Relation Age of Onset  . Colon cancer Mother   . Heart disease Father   . Heart disease Sister   . Heart disease Brother   . Diabetes Sister   . Heart disease Brother   . Heart disease Brother   :  Social History   Socioeconomic History  . Marital status: Married    Spouse name: Not on file  . Number of children: Not on file  . Years of education: Not on file  . Highest education level: Not on file  Social Needs  . Financial resource strain: Not on file  . Food insecurity - worry: Not on file  . Food insecurity - inability: Not on file  . Transportation needs - medical: Not on file  . Transportation needs - non-medical: Not on file  Occupational History  . Not on file  Tobacco Use  . Smoking status: Former Smoker    Packs/day: 1.50    Years: 30.00    Pack years: 45.00    Types: Cigarettes    Last attempt to quit: 02/17/1976    Years since quitting: 41.1  . Smokeless tobacco: Never Used  Substance and Sexual Activity  . Alcohol use: No    Comment: over a year   . Drug use: No    Comment: pt denies  . Sexual activity: Not Currently    Birth control/protection: None  Other Topics Concern  . Not on file  Social History Narrative  . Not on file  :  Pertinent items are noted in HPI.  Exam: Blood pressure (!) 144/73, pulse 75, temperature 97.8 F (36.6 C), temperature source Oral, resp. rate 18, height 5' 10"  (1.778 m), weight 229  lb 3.2 oz (104 kg), SpO2 99 %.   ECOG 1 General appearance: alert and cooperative appeared without distress. Head: atraumatic without any abnormalities. Eyes: conjunctivae/corneas clear. PERRL.  Sclera anicteric. Throat: lips, mucosa, and tongue normal; without oral thrush or ulcers. Resp: clear to auscultation bilaterally without rhonchi, wheezes or dullness to percussion. Cardio: regular rate and  rhythm, S1, S2 normal, no murmur, click, rub or gallop GI: soft, non-tender; bowel sounds normal; no masses,  no organomegaly Skin: Skin color, texture, turgor normal. No rashes or lesions Lymph nodes: Cervical, supraclavicular, and axillary nodes normal. Neurologic: Grossly normal without any motor, sensory or deep tendon reflexes. Musculoskeletal: No joint deformity or effusion.  CBC    Component Value Date/Time   WBC 8.1 03/29/2017 1456   WBC 6.2 08/14/2016 0916   RBC 4.11 (L) 03/29/2017 1456   RBC 3.41 (L) 08/14/2016 0916   HGB 8.1 (L) 03/29/2017 1456   HCT 29.4 (L) 03/29/2017 1456   PLT 253 03/29/2017 1456   MCV 72 (L) 03/29/2017 1456   MCH 19.7 (L) 03/29/2017 1456   MCH 24.3 (L) 08/14/2016 0916   MCHC 27.6 (L) 03/29/2017 1456   MCHC 29.5 (L) 08/14/2016 0916   RDW 17.4 (H) 03/29/2017 1456   LYMPHSABS 2.3 11/10/2015 1046   MONOABS 1.3 (H) 11/10/2015 1046   EOSABS 0.6 11/10/2015 1046   BASOSABS 0.0 11/10/2015 1046     Chemistry      Component Value Date/Time   NA 139 03/29/2017 1456   K 4.6 03/29/2017 1456   CL 96 03/29/2017 1456   CO2 30 (H) 03/29/2017 1456   BUN 15 03/29/2017 1456   CREATININE 1.15 03/29/2017 1456      Component Value Date/Time   CALCIUM 9.3 03/29/2017 1456   ALKPHOS 81 04/29/2015 0320   AST 19 04/29/2015 0320   ALT 38 04/29/2015 0320   BILITOT 0.8 04/29/2015 0320       Assessment and Plan:   72 year old gentleman with the following issues:  1.  Microcytic anemia noted in February 2019.  His hemoglobin was 8.1 with an MCV of 72.  His hemoglobin in June 2018 was 8.3 with an MCV of 82 and elevated RDW.  His hemoglobin has been close to normal range in 2017.  Differential diagnosis was discussed today and he could have multifactorial anemia with element of iron deficiency, chronic disease and possible renal insufficiency.  To work this up, we will obtain laboratory testing including iron studies, vitamin H37, folic acid, serum protein  electrophoresis as well as erythropoietin.  Depending on these findings we will replace any deficiencies as needed.  We have discussed the role of intravenous iron risks and benefits associated with this therapy that include arthralgias, myalgias and infusion related toxicities with Feraheme.  After discussion today, he is agreeable to proceed with intravenous iron and if his iron studies today support this diagnosis.  Myeloma workup will also be done at this time although I think is less likely in a plasma cell disorder.  Bone marrow biopsy was also discussed today is a possibility if his workup is unrevealing.  Conditions such as myelodysplastic syndrome would be a consideration.  2.  Colon cancer screening: He had a colonoscopy 5 years ago and if his fecal occult testing is positive I recommend repeat colonoscopy.  3.  Follow-up: He will have laboratory testing today and intravenous iron infusion scheduled tentatively next week and a follow-up in the next 2-3 weeks to follow his progress.  [facility]  minutes was spent with the patient face-to-face today.  More than 50% of time was dedicated to patient counseling, education and coordination of his multifaceted care.

## 2017-04-07 NOTE — Addendum Note (Signed)
Addended by: Lanier Prude D on: 04/07/2017 09:26 AM   Modules accepted: Orders

## 2017-04-08 ENCOUNTER — Telehealth: Payer: Self-pay | Admitting: *Deleted

## 2017-04-08 LAB — ERYTHROPOIETIN: Erythropoietin: 83.4 m[IU]/mL — ABNORMAL HIGH (ref 2.6–18.5)

## 2017-04-08 NOTE — Telephone Encounter (Signed)
As noted below by Dr. [name], I informed the wife of his iron level. He is already scheduled for IV iron. She verbalized understanding.

## 2017-04-08 NOTE — Telephone Encounter (Signed)
-----   Message from Wyatt Portela, MD sent at 04/08/2017  9:26 AM EST ----- Please let him know his iron is low and needs IV iron as we discussed.

## 2017-04-12 LAB — MULTIPLE MYELOMA PANEL, SERUM
ALPHA 1: 0.2 g/dL (ref 0.0–0.4)
Albumin SerPl Elph-Mcnc: 4 g/dL (ref 2.9–4.4)
Albumin/Glob SerPl: 1.3 (ref 0.7–1.7)
Alpha2 Glob SerPl Elph-Mcnc: 0.8 g/dL (ref 0.4–1.0)
B-GLOBULIN SERPL ELPH-MCNC: 1.2 g/dL (ref 0.7–1.3)
GAMMA GLOB SERPL ELPH-MCNC: 0.9 g/dL (ref 0.4–1.8)
GLOBULIN, TOTAL: 3.1 g/dL (ref 2.2–3.9)
IgA: 296 mg/dL (ref 61–437)
IgG (Immunoglobin G), Serum: 952 mg/dL (ref 700–1600)
IgM (Immunoglobulin M), Srm: 81 mg/dL (ref 15–143)
Total Protein ELP: 7.1 g/dL (ref 6.0–8.5)

## 2017-04-16 ENCOUNTER — Telehealth: Payer: Self-pay | Admitting: *Deleted

## 2017-04-16 NOTE — Telephone Encounter (Signed)
Wife requesting copy of labs, envelope left at dr shadad's nurses desk for patient to p/u when they come for iron on 04/20/17

## 2017-04-19 ENCOUNTER — Other Ambulatory Visit: Payer: Self-pay

## 2017-04-19 ENCOUNTER — Other Ambulatory Visit: Payer: Self-pay | Admitting: Physician Assistant

## 2017-04-19 MED ORDER — OMEPRAZOLE 20 MG PO CPDR: 20.0000 mg | DELAYED_RELEASE_CAPSULE | Freq: Every day | ORAL | 1 refills | Status: DC

## 2017-04-19 NOTE — Telephone Encounter (Signed)
Patient has a history of reflux.  I do not see any history in his chart why 40 mg daily would be warranted.    -Okay to refill 20 mg 1 p.o. daily, dispense 90 with 1 refill only

## 2017-04-19 NOTE — Telephone Encounter (Signed)
Pharmacy sent refill request for Omeprazole. Medication was last filled by pervious provider, request was sent to Dr. Raliegh Scarlet. MPulliam, CMA/RT(R)

## 2017-04-19 NOTE — Telephone Encounter (Signed)
Please see my note on pt below.Marland KitchenMarland KitchenMarland Kitchen

## 2017-04-20 ENCOUNTER — Inpatient Hospital Stay: Payer: Medicare Other | Attending: Oncology

## 2017-04-20 VITALS — BP 114/70 | HR 65 | Temp 97.9°F | Resp 18

## 2017-04-20 DIAGNOSIS — D509 Iron deficiency anemia, unspecified: Secondary | ICD-10-CM | POA: Diagnosis not present

## 2017-04-20 DIAGNOSIS — D649 Anemia, unspecified: Secondary | ICD-10-CM

## 2017-04-20 MED ORDER — SODIUM CHLORIDE 0.9 % IV SOLN
Freq: Once | INTRAVENOUS | Status: AC
  Administered 2017-04-20: 14:00:00 via INTRAVENOUS

## 2017-04-20 MED ORDER — SODIUM CHLORIDE 0.9 % IV SOLN
510.0000 mg | Freq: Once | INTRAVENOUS | Status: AC
  Administered 2017-04-20: 510 mg via INTRAVENOUS
  Filled 2017-04-20: qty 17

## 2017-04-20 NOTE — Patient Instructions (Signed)

## 2017-04-27 ENCOUNTER — Inpatient Hospital Stay: Payer: Medicare Other

## 2017-04-27 VITALS — BP 117/63 | HR 62 | Temp 98.0°F | Resp 18

## 2017-04-27 DIAGNOSIS — D509 Iron deficiency anemia, unspecified: Secondary | ICD-10-CM | POA: Diagnosis not present

## 2017-04-27 DIAGNOSIS — D649 Anemia, unspecified: Secondary | ICD-10-CM

## 2017-04-27 MED ORDER — SODIUM CHLORIDE 0.9 % IV SOLN
Freq: Once | INTRAVENOUS | Status: AC
  Administered 2017-04-27: 11:00:00 via INTRAVENOUS

## 2017-04-27 MED ORDER — SODIUM CHLORIDE 0.9 % IV SOLN
510.0000 mg | Freq: Once | INTRAVENOUS | Status: AC
  Administered 2017-04-27: 510 mg via INTRAVENOUS
  Filled 2017-04-27: qty 17

## 2017-04-27 NOTE — Patient Instructions (Signed)

## 2017-04-30 ENCOUNTER — Other Ambulatory Visit (INDEPENDENT_AMBULATORY_CARE_PROVIDER_SITE_OTHER): Payer: Medicare Other

## 2017-04-30 DIAGNOSIS — I10 Essential (primary) hypertension: Secondary | ICD-10-CM

## 2017-04-30 DIAGNOSIS — I2510 Atherosclerotic heart disease of native coronary artery without angina pectoris: Secondary | ICD-10-CM

## 2017-04-30 DIAGNOSIS — Z87891 Personal history of nicotine dependence: Secondary | ICD-10-CM

## 2017-04-30 DIAGNOSIS — E1169 Type 2 diabetes mellitus with other specified complication: Secondary | ICD-10-CM

## 2017-04-30 DIAGNOSIS — D649 Anemia, unspecified: Secondary | ICD-10-CM

## 2017-04-30 DIAGNOSIS — I509 Heart failure, unspecified: Secondary | ICD-10-CM

## 2017-04-30 DIAGNOSIS — E118 Type 2 diabetes mellitus with unspecified complications: Secondary | ICD-10-CM

## 2017-04-30 DIAGNOSIS — R5383 Other fatigue: Secondary | ICD-10-CM

## 2017-04-30 DIAGNOSIS — E1159 Type 2 diabetes mellitus with other circulatory complications: Secondary | ICD-10-CM

## 2017-04-30 DIAGNOSIS — I152 Hypertension secondary to endocrine disorders: Secondary | ICD-10-CM

## 2017-04-30 DIAGNOSIS — E782 Mixed hyperlipidemia: Secondary | ICD-10-CM

## 2017-05-01 LAB — COMPREHENSIVE METABOLIC PANEL
ALBUMIN: 4.3 g/dL (ref 3.5–4.8)
ALK PHOS: 83 IU/L (ref 39–117)
ALT: 15 IU/L (ref 0–44)
AST: 18 IU/L (ref 0–40)
Albumin/Globulin Ratio: 1.7 (ref 1.2–2.2)
BILIRUBIN TOTAL: 0.3 mg/dL (ref 0.0–1.2)
BUN / CREAT RATIO: 10 (ref 10–24)
BUN: 13 mg/dL (ref 8–27)
CO2: 29 mmol/L (ref 20–29)
Calcium: 9.3 mg/dL (ref 8.6–10.2)
Chloride: 96 mmol/L (ref 96–106)
Creatinine, Ser: 1.26 mg/dL (ref 0.76–1.27)
GFR calc non Af Amer: 57 mL/min/{1.73_m2} — ABNORMAL LOW (ref >59)
GFR, EST AFRICAN AMERICAN: 65 mL/min/{1.73_m2} (ref >59)
GLOBULIN, TOTAL: 2.5 g/dL (ref 1.5–4.5)
Glucose: 84 mg/dL (ref 65–99)
Potassium: 4.6 mmol/L (ref 3.5–5.2)
SODIUM: 140 mmol/L (ref 134–144)
TOTAL PROTEIN: 6.8 g/dL (ref 6.0–8.5)

## 2017-05-01 LAB — CBC WITH DIFFERENTIAL/PLATELET
BASOS: 2 %
Basophils Absolute: 0.1 10*3/uL (ref 0.0–0.2)
EOS (ABSOLUTE): 0.4 10*3/uL (ref 0.0–0.4)
EOS: 5 %
HEMATOCRIT: 33.9 % — AB (ref 37.5–51.0)
HEMOGLOBIN: 10 g/dL — AB (ref 13.0–17.7)
IMMATURE GRANULOCYTES: 1 %
Immature Grans (Abs): 0.1 10*3/uL (ref 0.0–0.1)
LYMPHS ABS: 3.2 10*3/uL — AB (ref 0.7–3.1)
Lymphs: 40 %
MCH: 21.5 pg — AB (ref 26.6–33.0)
MCHC: 29.5 g/dL — ABNORMAL LOW (ref 31.5–35.7)
MCV: 73 fL — ABNORMAL LOW (ref 79–97)
Monocytes Absolute: 0.5 10*3/uL (ref 0.1–0.9)
Monocytes: 6 %
NEUTROS PCT: 46 %
Neutrophils Absolute: 3.8 10*3/uL (ref 1.4–7.0)
Platelets: 208 10*3/uL (ref 150–379)
RBC: 4.65 x10E6/uL (ref 4.14–5.80)
RDW: 23.1 % — AB (ref 12.3–15.4)
WBC: 8 10*3/uL (ref 3.4–10.8)

## 2017-05-01 LAB — TSH: TSH: 4.89 u[IU]/mL — ABNORMAL HIGH (ref 0.450–4.500)

## 2017-05-01 LAB — LIPID PANEL
CHOL/HDL RATIO: 4 ratio (ref 0.0–5.0)
CHOLESTEROL TOTAL: 133 mg/dL (ref 100–199)
HDL: 33 mg/dL — AB (ref >39)
LDL CALC: 67 mg/dL (ref 0–99)
TRIGLYCERIDES: 163 mg/dL — AB (ref 0–149)
VLDL CHOLESTEROL CAL: 33 mg/dL (ref 5–40)

## 2017-05-01 LAB — T4, FREE: Free T4: 1.1 ng/dL (ref 0.82–1.77)

## 2017-05-01 LAB — IRON AND TIBC
IRON: 175 ug/dL — AB (ref 38–169)
Iron Saturation: 48 % (ref 15–55)
Total Iron Binding Capacity: 368 ug/dL (ref 250–450)
UIBC: 193 ug/dL (ref 111–343)

## 2017-05-01 LAB — FOLATE

## 2017-05-01 LAB — HEMOGLOBIN A1C
Est. average glucose Bld gHb Est-mCnc: 117 mg/dL
Hgb A1c MFr Bld: 5.7 % — ABNORMAL HIGH (ref 4.8–5.6)

## 2017-05-01 LAB — VITAMIN B12: Vitamin B-12: 843 pg/mL (ref 232–1245)

## 2017-05-01 LAB — VITAMIN D 25 HYDROXY (VIT D DEFICIENCY, FRACTURES): Vit D, 25-Hydroxy: 27.6 ng/mL — ABNORMAL LOW (ref 30.0–100.0)

## 2017-05-01 LAB — FERRITIN: Ferritin: 565 ng/mL — ABNORMAL HIGH (ref 30–400)

## 2017-05-01 LAB — TRANSFERRIN: Transferrin: 255 mg/dL (ref 200–370)

## 2017-05-03 ENCOUNTER — Ambulatory Visit (INDEPENDENT_AMBULATORY_CARE_PROVIDER_SITE_OTHER): Payer: Medicare Other | Admitting: Family Medicine

## 2017-05-03 ENCOUNTER — Encounter: Payer: Self-pay | Admitting: Family Medicine

## 2017-05-03 VITALS — BP 129/80 | HR 75 | Ht 70.0 in | Wt 230.0 lb

## 2017-05-03 DIAGNOSIS — I152 Hypertension secondary to endocrine disorders: Secondary | ICD-10-CM

## 2017-05-03 DIAGNOSIS — M8580 Other specified disorders of bone density and structure, unspecified site: Secondary | ICD-10-CM | POA: Diagnosis not present

## 2017-05-03 DIAGNOSIS — E1169 Type 2 diabetes mellitus with other specified complication: Secondary | ICD-10-CM

## 2017-05-03 DIAGNOSIS — F39 Unspecified mood [affective] disorder: Secondary | ICD-10-CM | POA: Diagnosis not present

## 2017-05-03 DIAGNOSIS — N183 Chronic kidney disease, stage 3 (moderate): Secondary | ICD-10-CM

## 2017-05-03 DIAGNOSIS — I509 Heart failure, unspecified: Secondary | ICD-10-CM

## 2017-05-03 DIAGNOSIS — D649 Anemia, unspecified: Secondary | ICD-10-CM | POA: Diagnosis not present

## 2017-05-03 DIAGNOSIS — E559 Vitamin D deficiency, unspecified: Secondary | ICD-10-CM | POA: Diagnosis not present

## 2017-05-03 DIAGNOSIS — E118 Type 2 diabetes mellitus with unspecified complications: Secondary | ICD-10-CM

## 2017-05-03 DIAGNOSIS — R5383 Other fatigue: Secondary | ICD-10-CM | POA: Diagnosis not present

## 2017-05-03 DIAGNOSIS — I10 Essential (primary) hypertension: Secondary | ICD-10-CM | POA: Diagnosis not present

## 2017-05-03 DIAGNOSIS — E1159 Type 2 diabetes mellitus with other circulatory complications: Secondary | ICD-10-CM | POA: Diagnosis not present

## 2017-05-03 DIAGNOSIS — E782 Mixed hyperlipidemia: Secondary | ICD-10-CM | POA: Diagnosis not present

## 2017-05-03 DIAGNOSIS — N179 Acute kidney failure, unspecified: Secondary | ICD-10-CM | POA: Diagnosis not present

## 2017-05-03 MED ORDER — VITAMIN D (ERGOCALCIFEROL) 1.25 MG (50000 UNIT) PO CAPS: 50000.0000 [IU] | ORAL_CAPSULE | ORAL | 10 refills | Status: DC

## 2017-05-03 MED ORDER — GABAPENTIN 300 MG PO CAPS: 900.0000 mg | ORAL_CAPSULE | Freq: Every day | ORAL | 1 refills | Status: DC

## 2017-05-03 NOTE — Progress Notes (Signed)
Assessment and plan:  1. Type 2 diabetes mellitus with complication, unspecified whether long term insulin use ([place])   2. Hypertension associated with diabetes ([place])   3. Mixed diabetic hyperlipidemia associated with type 2 diabetes mellitus ([place])   4. Congestive heart failure, unspecified HF chronicity, unspecified heart failure type ([place])   5. Fatigue, unspecified type   6. Anemia, unspecified type   7. Acute renal failure superimposed on stage 3 chronic kidney disease, unspecified acute renal failure type ([place])   8. Mood disorder ([place])- mixed PTSD and depression   9. Vitamin D deficiency   10. Osteopenia, unspecified location    Lab work reviewed in detail today.  1. Type 2 diabetes mellitus with complication, unspecified whether long term insulin use (HCC)  - A1c measurements looking very good. - 2 years ago 6.1. - 5.7 today.  - Wife notes that she believes eating peanut butter & banana sandwiches has helped lower his A1c. - Metformin - Patient currently takes 2 tabs of 500's at night.  - Discussed that we can start taking him down on the metformin if he watches what he eats and gets more active. - Advised to make sure he is not bottoming out, as he eats better and moves more. - Re-check in 3-4 months.  If sugar is slow and he continues doing well, we will keep him on 500 metformin, but not 1000.  - Encouraged to take blood sugar measurements in the mornings and 2 hours post-prandial.  - Continue to monitor.  2. Hypertension associated with diabetes ([place])  - Continue to monitor.  3. Mixed diabetic hyperlipidemia associated with type 2 diabetes mellitus (HCC)  - Both HDL and triglycerides have improved from last measure 2 years ago.   - HDL is too low - discussed we want it 40 or above as normal. - Advised that the less active he is, the worse this number will be. - Encouraged patient to exercise more to  increase this number.  - LDL is under 70 - reviewed that this is within goal.  - Advised patient to eat more omega 3 fatty acids.  - Continue to monitor.  4. Congestive heart failure, unspecified HF chronicity, unspecified heart failure type ([place])  - Continue to monitor.  5. Fatigue, unspecified type  - Continue to monitor.  6. Anemia, unspecified type  - All levels have normalized. - Transferrin levels normal.  Folate is normal. - Ferritin levels a little bit high, which is a good thing. - Iron saturation is good - iron levels went from 13 to 175. - Hemoglobin went up to 10. - Was on PO iron per wife, but stopped it with his iron infusions.  - Discussed that with his increased iron, he should be feeling less fatigue, less tired. - Will continue to monitor.  Patient will follow up with hematology.  7. Acute renal failure superimposed on stage 3 chronic kidney disease, unspecified acute renal failure type [facility])  - Reviewed kidney health in detail with the patient. - Is in chronic renal failure - reviewed that his kidneys are a little bit up from where we want them to be - Typically function of age, possibly of medications, likely due to not moving around much, not drinking enough fluids. - Electrolytes are stable.  8. Thyroid - hypothyroidism - T4 hormone is good.  Minor high TSH.  - Advised patient that he should be taking his thyroid medication alone at noontime before he eats. -  Though the patient has symptoms of fatigue and cold intolerance, since he is not taking his thyroid medication as prescribed, apart from other medicines, we are going to try this first before adjusting dosage.  - In the future, we may keep him on the 75 but give him half of a 25 microgram pill as needed. - This will allow [place] to flexibly medicate with 75 mcg one day, 88 mcg the next.  9. Calcium & Vitamin D - Osteoporosis - Will continue to check his Calcium and Vitamin D, in addition to regular  follow-up.  - Vitamin D is 27.6.  Discussed that it should be int he 50-60 range. - Reviewed that Vitamin D is important in the body to absorb and use Calcium. - Advised patient to begin walking outside more or begin supplementation. - Patient's wife gives him supplementation of Vitamin D at 2 tabs of 1000's every day. - He will begin once weekly prescription vitamin D in addition.  - Will continue to monitor.  10. Liver Function - Reviewed liver function - advised patient that it is normal.  - Continued to advise patient to walk more, drink more water, try to be more physically active - Reviewed that organs are healthier the more blood flow they receive.  11. Nerve Pain - Continues to take gabapentin for diabetic nerve pain in his legs and feet, and his right knee and back pain.  12. Joint Pain (Knee Pain) - Recommended to continue going to orthopedics for joint pain.  13. General Health Maintenance - Patient has a rolling walker that he agrees to use regularly.  - Advised patient to continue working toward exercising to improve health.   - Patient should use his wheeled walker and challenge himself to walk up & back on his driveway twice a day.  - Patient may begin with 15 minutes of activity daily.  Goal should be 30 minutes 5+ days per week.  Recommended that the patient eventually strive for at least 150 minutes of cardio per week according to guidelines established by the [facility].   - Healthy dietary habits encouraged, including low-carb, and high amounts of lean protein in diet.   - Patient should also consume adequate amounts of water - half of body weight in oz of water per day  Mood, PTSD, Panic Attacks - To manage anxiety, reviewed that he should walk more, try to use alternatives to his klonopin for PTSD and panic.  - Had a long conversation with the patient that he should be trying to avoid use of klonopin, given the fact that it is indicated with onset of dementia and  not recommended in elderly patients.  - Had a long discussion about mood in general.  PHQ is better this visit, and patient emphasized that his mood is okay.  14. Follow-Up - In 3 months, check Vitamin D, A1c, TSH, and T4.  - Given the patient's general concerns about his health, advised regular follow-up both here and with his specialists, including a complete physical exam once a year.  - He should be obtaining regular health screenings such as colonoscopies, regular rectal exams, regular physicals, and regular health maintenance with his specialists.  Encouraged patient to continue visiting dermatology once a year.  Pt was in the office today for 36+ minutes, with over 50% time spent in face to face counseling of patients various medical conditions, treatment plans of those medical conditions including medicine management and lifestyle modification, strategies to improve health and well being;  and in coordination of care. SEE ABOVE FOR DETAILS    Education and routine counseling performed. Handouts provided.  No orders of the defined types were placed in this encounter.   Meds ordered this encounter  Medications  . Vitamin D, Ergocalciferol, (DRISDOL) 50000 units CAPS capsule    Sig: Take 1 capsule (50,000 Units total) by mouth every 7 (seven) days.    Dispense:  12 capsule    Refill:  10  . gabapentin (NEURONTIN) 300 MG capsule    Sig: Take 3 capsules (900 mg total) by mouth at bedtime.    Dispense:  270 capsule    Refill:  1     Return for Come in 3-4 months chronic follow-up; also additionally for Medicare wellness.   Anticipatory guidance and routine counseling done re: condition, txmnt options and need for follow up. All questions of patient's were answered.   Gross side effects, risk and benefits, and alternatives of medications discussed with patient.  Patient is aware that all medications have potential side effects and we are unable to predict every sideeffect or  drug-drug interaction that may occur.  Expresses verbal understanding and consents to current therapy plan and treatment regiment.  Please see AVS handed out to patient at the end of our visit for additional patient instructions/ counseling done pertaining to today's office visit.  Note: This document was prepared using Dragon voice recognition software and may include unintentional dictation errors.    This document serves as a record of services personally performed by Mellody Dance, DO. It was created on her behalf by Toni Amend, a trained medical scribe. The creation of this record is based on the scribe's personal observations and the provider's statements to them.   I have reviewed the above medical documentation for accuracy and completeness and I concur.  Mellody Dance 05/03/17 11:09 AM   ----------------------------------------------------------------------------------------------------------------------  Subjective:   CC:   [name] [name] is a 72 y.o. male who presents to [facility] today for review and discussion of recent bloodwork that was done.  1. All recent blood work that we ordered was reviewed with patient today.  Patient was counseled on all abnormalities and we discussed dietary and lifestyle changes that could help those values (also medications when appropriate).  Extensive health counseling performed and all patient's concerns/ questions were addressed.   Wife is a retired Therapist, sports and monitors patient closely.  Acute Left Hand Injury from Fall Hand injury from fall 1 week ago.  Broke skin, bruising.  Wife is a retired Therapist, sports and has been taking care of his injury.  Patient notes that he was going to the bathroom, when he tripped and fell.  He grabbed an [employer] to break his fall, and he went down, "fighting to the end."  Wife notes that he also hit his chest and his knee, but his fingers looked the worst.  Wife believes his main  pain was due to muscle strain when he tried to catch himself.  She iced his left hand the first night.  She has been applying dry dressings with a sterile cloth, and washes it once a day with antibiotics.  Patient denies pain on palpation of his hand and chest.  Wife notes that he's had broken ribs before, so this wasn't so bad.  Patient does have a rolling walker that he agrees to begin using.  Osteoporosis Wife notes that he has osteoporosis.  He is not receiving treatment for this.  He takes calcium and vitamin D supplements, and Tums (per wife, "he eats 10 a day").  Anemia Returns to Hematology in April, and Cardiology sometime in April or May. Per wife, will be obtaining 2 total infusions before follow-up.  Nerve Pain Takes gabapentin for diabetic nerve pain in his legs and feet, and his right knee and back pain. Patient had knee injections in the past, and notes that it worked well on his left knee.  When he gets up in the night, he feels tingling in his feet. Wife notes that his feet also itch a lot.  Thyroid Wife notes that he is cold all the time.  Has been on 75 micrograms for over a year. Wife notes that he has a hard time taking his thyroid medication as recommended, by itself prior to eating.  Per patient, was told at the [place] in [place] that he had growth on his thyroid 2 years ago.  Mood Does not see a psychiatrist. Takes klonopin for panic attacks associated with PTSD.  Per wife, patient is concerned about his multiple medical problems and wonders if they could be caused by something like cancer.  Patient was advised that if he is worried about that, he should be obtaining regular health screenings such as colonoscopies, regular rectal exams, regular physicals, and regular health maintenance with his specialists.   Wt Readings from Last 3 Encounters:  05/03/17 230 lb (104.3 kg)  04/07/17 229 lb 3.2 oz (104 kg)  04/01/17 230 lb 9.6 oz (104.6 kg)   BP Readings  from Last 3 Encounters:  05/03/17 129/80  04/27/17 117/63  04/20/17 114/70   Pulse Readings from Last 3 Encounters:  05/03/17 75  04/27/17 62  04/20/17 65   BMI Readings from Last 3 Encounters:  05/03/17 33.00 kg/m  04/07/17 32.89 kg/m  04/01/17 33.09 kg/m     Depression screen [facility] 2/9 05/03/2017 04/01/2017  Decreased Interest 3 3  Down, Depressed, Hopeless 2 3  PHQ - 2 Score 5 6  Altered sleeping 3 2  Tired, decreased energy 3 3  Change in appetite 2 3  Feeling bad or failure about yourself  2 3  Trouble concentrating 2 2  Moving slowly or fidgety/restless 2 3  Suicidal thoughts 0 0  PHQ-9 Score 19 22    Patient Care Team    Relationship Specialty Notifications Start End  Mellody Dance, DO PCP - General Family Medicine  03/30/17   Minus Breeding, MD Consulting Physician Cardiology  04/01/17   Dorna Leitz, MD Consulting Physician Orthopedic Surgery  04/01/17   Idamae Lusher, MD  Ophthalmology  04/01/17   Clydell Hakim, MD Consulting Physician Anesthesiology  04/01/17    Comment: pain mgt-  chronic pain syn.   Allyn Kenner, MD  Dermatology  04/01/17    Comment: bowen's disease on chest- cryo txmnt  Ralene Ok, MD Consulting Physician General Surgery  04/01/17   Fanny Skates, MD Consulting Physician General Surgery  04/01/17   Georganna Skeans, MD Consulting Physician General Surgery  04/01/17   Wonda Horner, MD Consulting Physician Gastroenterology  04/01/17     Full medical history updated and reviewed in the office today  Patient Active Problem List   Diagnosis Date Noted  . Hypertension associated with diabetes ([place]) 04/01/2017    Priority: High  . Mixed diabetic hyperlipidemia associated with type 2 diabetes mellitus ([place]) 04/01/2017    Priority: High  . Type 2 diabetes mellitus with complication [facility])     Priority:  High  . Acute on chronic renal failure ([place]) 08/25/2014    Priority: High  . Anemia 04/07/2017    Priority: Medium  . CHF (congestive  heart failure) ([place]) 04/01/2017    Priority: Medium  . History of smoking 30 or more pack years 04/01/2017    Priority: Medium  . Mood disorder ([place])- mixed PTSD and depression 04/26/2015    Priority: Medium  . GERD (gastroesophageal reflux disease) 04/26/2015    Priority: Medium  . Insomnia 02/06/2011    Priority: Medium  . CAD (coronary artery disease)     Priority: Medium  . Hypothyroidism 04/26/2015    Priority: Low  . Hypokalemia 08/24/2014    Priority: Low  . Alcohol abuse 08/24/2014    Priority: Low  . Fatigue 04/01/2017  . S/P hernia repair 07/27/2016  . Preop cardiovascular exam 05/30/2016  . S/P colostomy takedown 11/05/2015  . Abdominal pain   . Agitation   . Threatening to others   . Diverticulitis of large intestine with perforation without bleeding   . CAD in native artery   . Diverticulitis 04/26/2015  . Perforated diverticulum of large intestine 04/26/2015  . Diverticulitis of colon with perforation 04/26/2015  . Acute encephalopathy 08/25/2014  . Blood poisoning   . Elevated troponin   . CAP (community acquired pneumonia) 08/24/2014  . Sepsis ([place]) 08/24/2014  . AKI (acute kidney injury) ([place]) 08/24/2014  . DM (diabetes mellitus), type 2 ([place]) 02/28/2013  . Abnormal EKG 02/28/2013  . Fistula-in-ano 11/05/2011  . Hyperlipidemia 02/06/2011  . Hypertension     Past Medical History:  Diagnosis Date  . Anal fistula   . Anemia   . Anxiety    panic attacks  . Arthritis   . CAD (coronary artery disease)    a. s/p CABG 2001; b. LHC (3/09):  S-RCA, L-LAD, S-OM1/dCFX all patent, EF 55-60%;  c. myoview (12/12):  no ischemia, EF 68%;  d. Dob Echo (1/14):  + ECG changes but normal echo => med Rx cont'd  . Cancer [facility])    pt denies  . CHF (congestive heart failure) ([place])   . Complication of anesthesia    CONFUSION, psychosis after surgery for a week  . DDD (degenerative disc disease), lumbar    cervical thoracic and lumbar  . Diabetes mellitus without  complication ([place])   . Diverticulitis   . GERD (gastroesophageal reflux disease)   . Gout   . Gout   . History of pneumonia    septic pneumonia  . Hyperlipidemia   . Hypertension   . Hypothyroidism   . IBS (irritable bowel syndrome)   . Myocardial infarction ([place])   . Osteoporosis   . PTSD (post-traumatic stress disorder)   . PTSD (post-traumatic stress disorder)   . Scoliosis   . Spinal stenosis     Past Surgical History:  Procedure Laterality Date  . APPENDECTOMY    . BLEPHAROPLASTY Bilateral   . CERVICAL FUSION     times 2  . CHOLECYSTECTOMY    . COLOSTOMY REVERSAL  11/05/2015  . COLOSTOMY TAKEDOWN N/A 11/05/2015   Procedure: COLOSTOMY TAKEDOWN;  Surgeon: Georganna Skeans, MD;  Location: [place];  Service: General;  Laterality: N/A;  . CORONARY ARTERY BYPASS GRAFT     2001 LIMA LAD, SVG OM/Circ dista, SVG PDA.  Cath 2009 with patent grafts  . HAND SURGERY Right   . HIP ARTHROPLASTY Right 03/01/2013   Procedure: ARTHROPLASTY BIPOLAR HIP;  Surgeon: Alta Corning, MD;  Location: [facility];  Service: Orthopedics;  Laterality: Right;  . INCISIONAL HERNIA REPAIR N/A 11/05/2015   Procedure: REPAIR INCISIONAL HERNIA;  Surgeon: Georganna Skeans, MD;  Location: [place];  Service: General;  Laterality: N/A;  . INCISIONAL HERNIA REPAIR N/A 07/27/2016   Procedure: HERNIA REPAIR INCISIONAL;  Surgeon: Ralene Ok, MD;  Location: [place];  Service: General;  Laterality: N/A;  . INSERTION OF MESH N/A 07/27/2016   Procedure: INSERTION OF MESH;  Surgeon: Ralene Ok, MD;  Location: [place];  Service: General;  Laterality: N/A;  . JOINT REPLACEMENT     Right knee  . KNEE ARTHROSCOPY Bilateral   . LAPAROTOMY N/A 07/27/2016   Procedure: EXPLORATORY LAPAROTOMY;  Surgeon: Ralene Ok, MD;  Location: [place];  Service: General;  Laterality: N/A;  . LYSIS OF ADHESION N/A 07/27/2016   Procedure: LYSIS OF ADHESION;  Surgeon: Ralene Ok, MD;  Location: [place];  Service: General;  Laterality: N/A;  .  PARTIAL COLECTOMY N/A 04/26/2015   Procedure: EXPLORATORY LAPAROTOMY PARTIAL COLECTOMY WITH COLOSTOMY AND HARTMANN'S;  Surgeon: Georganna Skeans, MD;  Location: [place];  Service: General;  Laterality: N/A;  . PLACEMENT OF SETON     seton stitch  . STERNAL WIRE REMOVAL    . TREATMENT FISTULA ANAL     times 3    Social History   Tobacco Use  . Smoking status: Former Smoker    Packs/day: 1.50    Years: 30.00    Pack years: 45.00    Types: Cigarettes    Last attempt to quit: 02/17/1976    Years since quitting: 41.2  . Smokeless tobacco: Never Used  Substance Use Topics  . Alcohol use: No    Comment: over a year     Family Hx: Family History  Problem Relation Age of Onset  . Colon cancer Mother   . Heart disease Father   . Heart disease Sister   . Heart disease Brother   . Diabetes Sister   . Heart disease Brother   . Heart disease Brother      Medications: Current Outpatient Medications  Medication Sig Dispense Refill  . allopurinol (ZYLOPRIM) 300 MG tablet Take 300 mg by mouth at bedtime.     Marland Kitchen aspirin EC 325 MG tablet Take 1 tablet (325 mg total) by mouth 2 (two) times daily after a meal. Take x 1 month post op. (Patient taking differently: Take 325 mg by mouth daily with breakfast. ) 60 tablet 0  . B Complex-C (B-COMPLEX WITH VITAMIN C) tablet Take 1 tablet by mouth daily with breakfast.     . calcium carbonate (TUMS - DOSED IN MG ELEMENTAL CALCIUM) 500 MG chewable tablet Chew 1-3 tablets by mouth 3 (three) times daily as needed for indigestion or heartburn.    . cholecalciferol (VITAMIN D) 1000 units tablet Take 3,000 Units by mouth daily.    . clonazePAM (KLONOPIN) 1 MG tablet Take 1 mg by mouth 3 (three) times daily as needed for anxiety.     . Cobalamine Combinations (K-53 + FOLIC ACID) 9767-341 MCG TBDP Take 1 tablet by mouth daily.    Marland Kitchen docusate sodium (COLACE) 100 MG capsule Take 100 mg by mouth 4 (four) times daily as needed (for constipation).    . furosemide  (LASIX) 20 MG tablet Take a 20 mg tablet daily or as directed 90 tablet 3  . gabapentin (NEURONTIN) 300 MG capsule Take 3 capsules (900 mg total) by mouth at bedtime. 270 capsule 1  . Guaifenesin ([place])  1200 MG TB12 Take 1 tablet by mouth 2 (two) times daily.    Marland Kitchen HYDROcodone-acetaminophen (NORCO) 10-325 MG tablet Take 1 tablet by mouth every 6 (six) hours as needed (for pain).   0  . hydrocortisone cream 1 % Apply 1 application topically daily as needed for itching (for feet).    Marland Kitchen HYDROmorphone (DILAUDID) 2 MG tablet Take 2 mg by mouth every 12 (twelve) hours as needed for severe pain.    Marland Kitchen levothyroxine (SYNTHROID, LEVOTHROID) 75 MCG tablet Take 75 mcg by mouth daily before breakfast.   3  . losartan (COZAAR) 50 MG tablet Take 1 tablet by mouth daily.  6  . Magnesium 500 MG TABS Take 500 mg by mouth daily with breakfast.    . Melatonin 5 MG CAPS Take 1 capsule by mouth daily.    . metFORMIN (GLUCOPHAGE-XR) 500 MG 24 hr tablet Take 1,000 mg by mouth at bedtime.  1  . metoprolol (LOPRESSOR) 100 MG tablet Take 100 mg by mouth 2 (two) times daily.  0  . nitroGLYCERIN (NITROSTAT) 0.4 MG SL tablet DISSOLVE 1 TABLET UNDER THE TONGUE EVERY 5 MINUTES FOR UP TO 3 DOSES AS NEEDED FOR CHEST PAIN. IF NO RELIEF AFTER 3 DOSES, CALL 911 OR GO TO 25 tablet 2  . omeprazole (PRILOSEC) 20 MG capsule Take 1 capsule (20 mg total) by mouth daily with breakfast. 90 capsule 1  . Polyethyl Glycol-Propyl Glycol (SYSTANE OP) Apply to eye.    . potassium gluconate 595 (99 K) MG TABS tablet Take 595 mg by mouth daily with breakfast.     . Probiotic Product (FLORAJEN3 PO) Take 1 capsule by mouth daily with breakfast.    . rOPINIRole (REQUIP) 0.5 MG tablet Take 1 tablet by mouth daily.  2  . venlafaxine XR (EFFEXOR-XR) 75 MG 24 hr capsule Take 225 mg by mouth at bedtime.  3  . Vitamin D, Ergocalciferol, (DRISDOL) 50000 units CAPS capsule Take 1 capsule (50,000 Units total) by mouth every 7 (seven) days. 12  capsule 10   No current facility-administered medications for this visit.     Allergies:  Allergies  Allergen Reactions  . Ciprofloxacin Hcl Other (See Comments)    Causes pain in tendons, feels like muscle spasms. BLACK BOX WARNING RE: POSSIBLE TENDON RUPTURE, MYALGIAS, and OTHERS  . Fentanyl Other (See Comments)    Pt can handle having Fentanyl under anesthesia - does not tolerate after surgery for post op pain - causes mood swings, confusion, and agitation    . Flomax [Tamsulosin Hcl] Other (See Comments)    DROPS BLOOD PRESSURE     Review of Systems: General:   No F/C, wt loss Pulm:   No DIB, SOB, pleuritic chest pain Card:  No CP, palpitations Abd:  No n/v/d or pain Ext:  No inc edema from baseline  Objective:  Blood pressure 129/80, pulse 75, height 5' 10"  (1.778 m), weight 230 lb (104.3 kg), SpO2 94 %. Body mass index is 33 kg/m. Gen:   Well NAD, A and O *3 HEENT:    [place]/AT, EOMI,  MMM Lungs:   Normal work of breathing. CTA B/L, no Wh, rhonchi Chest: No tenderness to palpation along any of the bony areas of the ribs or chest wall on the left. Heart:   RRR, S1, S2 WNL's, no MRG Abd:   No gross distention Exts:    warm, pink,  Brisk capillary refill, warm and well perfused, patient with abrasions on left fourth and fifth  digits as well as ecchymosis proximal dorsal surface of hand in that area.  No tenderness to palpation, no bony tenderness.  Full range of motion of the hand and fingers.  Grip strength normal. Psych:    No HI/SI, judgement and insight good, Euthymic mood. Full Affect.   Recent Results (from the past 2160 hour(s))  CBC     Status: Abnormal   Collection Time: 03/29/17  2:56 PM  Result Value Ref Range   WBC 8.1 3.4 - 10.8 x10E3/uL   RBC 4.11 (L) 4.14 - 5.80 x10E6/uL   Hemoglobin 8.1 (L) 13.0 - 17.7 g/dL   Hematocrit 29.4 (L) 37.5 - 51.0 %   MCV 72 (L) 79 - 97 fL   MCH 19.7 (L) 26.6 - 33.0 pg   MCHC 27.6 (L) 31.5 - 35.7 g/dL   RDW 17.4 (H) 12.3 -  15.4 %   Platelets 253 150 - 379 x10E3/uL  TSH     Status: None   Collection Time: 03/29/17  2:56 PM  Result Value Ref Range   TSH 3.490 0.450 - 4.500 uIU/mL  Basic Metabolic Panel (BMET)     Status: Abnormal   Collection Time: 03/29/17  2:56 PM  Result Value Ref Range   Glucose 76 65 - 99 mg/dL   BUN 15 8 - 27 mg/dL   Creatinine, Ser 1.15 0.76 - 1.27 mg/dL   GFR calc non Af Amer 63 >59 mL/min/1.73   GFR calc Af Amer 73 >59 mL/min/1.73   BUN/Creatinine Ratio 13 10 - 24   Sodium 139 134 - 144 mmol/L   Potassium 4.6 3.5 - 5.2 mmol/L   Chloride 96 96 - 106 mmol/L   CO2 30 (H) 20 - 29 mmol/L   Calcium 9.3 8.6 - 10.2 mg/dL  IFOBT POC (occult bld, rslt in office)     Status: Normal   Collection Time: 04/01/17 12:00 AM  Result Value Ref Range   IFOBT Negative   IFOBT POC (occult bld, rslt in office)     Status: Normal   Collection Time: 04/02/17 12:00 AM  Result Value Ref Range   IFOBT Negative   IFOBT POC (occult bld, rslt in office)     Status: Normal   Collection Time: 04/05/17 12:00 AM  Result Value Ref Range   IFOBT Negative   CBC with Differential (Cancer Center Only)     Status: Abnormal   Collection Time: 04/07/17  2:37 PM  Result Value Ref Range   WBC Count 9.2 4.0 - 10.3 K/uL   RBC 4.37 4.20 - 5.82 MIL/uL   Hemoglobin 8.7 (L) 13.0 - 17.1 g/dL   HCT 32.2 (L) 38.4 - 49.9 %   MCV 73.7 (L) 79.3 - 98.0 fL   MCH 19.9 (L) 27.2 - 33.4 pg   MCHC 27.0 (L) 32.0 - 36.0 g/dL   RDW 17.8 (H) 11.0 - 14.6 %   Platelet Count 259 140 - 400 K/uL   Neutrophils Relative % 50 %   Neutro Abs 4.6 1.5 - 6.5 K/uL   Lymphocytes Relative 40 %   Lymphs Abs 3.7 (H) 0.9 - 3.3 K/uL   Monocytes Relative 7 %   Monocytes Absolute 0.7 0.1 - 0.9 K/uL   Eosinophils Relative 2 %   Eosinophils Absolute 0.2 0.0 - 0.5 K/uL   Basophils Relative 1 %   Basophils Absolute 0.1 0.0 - 0.1 K/uL    Comment: Performed at [facility], [address], [place], [place] [address]  CMP  (Cancer Center only)     Status: Abnormal   Collection Time: 04/07/17  2:37 PM  Result Value Ref Range   Sodium 139 136 - 145 mmol/L   Potassium 3.9 3.5 - 5.1 mmol/L   Chloride 98 98 - 109 mmol/L   CO2 31 (H) 22 - 29 mmol/L   Glucose, Bld 92 70 - 140 mg/dL   BUN 16 7 - 26 mg/dL   Creatinine 1.18 0.70 - 1.30 mg/dL   Calcium 9.6 8.4 - 10.4 mg/dL   Total Protein 7.2 6.4 - 8.3 g/dL   Albumin 4.0 3.5 - 5.0 g/dL   AST 11 5 - 34 U/L   ALT 12 0 - 55 U/L   Alkaline Phosphatase 77 40 - 150 U/L   Total Bilirubin 0.3 0.2 - 1.2 mg/dL   GFR, Est Non Af Am 60 (L) >60 mL/min   GFR, Est AFR Am >60 >60 mL/min    Comment: (NOTE) The eGFR has been calculated using the CKD EPI equation. This calculation has not been validated in all clinical situations. eGFR's persistently <60 mL/min signify possible Chronic Kidney Disease.    Anion gap 10 3 - 11    Comment: Performed at [facility], [address] [place], [place] [address]  Erythropoietin     Status: Abnormal   Collection Time: 04/07/17  2:37 PM  Result Value Ref Range   Erythropoietin 83.4 (H) 2.6 - 18.5 mIU/mL    Comment: (NOTE) [employer] UniCel DxI 800 Immunoassay System Values obtained with different assay methods or kits cannot be used interchangeably. Results cannot be interpreted as absolute evidence of the presence or absence of malignant disease. Performed At: [facility] [place], [place] [id number] Rush Farmer MD [id number] Performed at [facility], [place] [address] [place], [place] [address]   Ferritin     Status: Abnormal   Collection Time: 04/07/17  2:37 PM  Result Value Ref Range   Ferritin 8 (L) 24 - 336 ng/mL    Comment: Performed at [facility], [place] [address], [place], [place] [address]  Iron and TIBC     Status: Abnormal   Collection Time: 04/07/17  2:37 PM  Result Value Ref Range   Iron 13 (L) 45 - 182 ug/dL   TIBC 462  (H) 250 - 450 ug/dL   Saturation Ratios 3 (L) 17.9 - 39.5 %   UIBC 449 ug/dL    Comment: Performed at [facility], [place] [address] [place], [place] [address]  Multiple Myeloma Panel (SPEP&IFE w/QIG)     Status: None   Collection Time: 04/07/17  2:37 PM  Result Value Ref Range   IgG (Immunoglobin G), Serum 952 700 - 1,600 mg/dL   IgA 296 61 - 437 mg/dL   IgM (Immunoglobulin M), Srm 81 15 - 143 mg/dL   Total Protein ELP 7.1 6.0 - 8.5 g/dL   Albumin SerPl Elph-Mcnc 4.0 2.9 - 4.4 g/dL   Alpha 1 0.2 0.0 - 0.4 g/dL   Alpha2 Glob SerPl Elph-Mcnc 0.8 0.4 - 1.0 g/dL   B-Globulin SerPl Elph-Mcnc 1.2 0.7 - 1.3 g/dL   Gamma Glob SerPl Elph-Mcnc 0.9 0.4 - 1.8 g/dL   M Protein SerPl Elph-Mcnc Not Observed Not Observed g/dL   Globulin, Total 3.1 2.2 - 3.9 g/dL   Albumin/Glob SerPl 1.3 0.7 - 1.7   IFE 1 Comment     Comment: An apparent normal immunofixation  pattern.   Please Note Comment     Comment: (NOTE) Protein electrophoresis scan will follow via computer, mail, or courier delivery. Performed At: [facility] [place], [place] [id number] Rush Farmer MD [id number] Performed at [facility], [place] [address], [place], [place] [address]   Vitamin B12     Status: Abnormal   Collection Time: 04/07/17  2:37 PM  Result Value Ref Range   Vitamin B-12 952 (H) 180 - 914 pg/mL    Comment: (NOTE) This assay is not validated for testing neonatal or myeloproliferative syndrome specimens for Vitamin B12 levels. Performed at [facility], [place] [address] [place], [place] [address]   Transferrin     Status: None   Collection Time: 04/30/17  8:54 AM  Result Value Ref Range   Transferrin 255 200 - 370 mg/dL  Ferritin     Status: Abnormal   Collection Time: 04/30/17  8:54 AM  Result Value Ref Range   Ferritin 565 (H) 30 - 400 ng/mL  Iron and TIBC     Status: Abnormal   Collection Time: 04/30/17  8:54 AM  Result Value Ref Range    Total Iron Binding Capacity 368 250 - 450 ug/dL   UIBC 193 111 - 343 ug/dL   Iron 175 (H) 38 - 169 ug/dL   Iron Saturation 48 15 - 55 %  Folate     Status: None   Collection Time: 04/30/17  8:54 AM  Result Value Ref Range   Folate >20.0 >3.0 ng/mL    Comment: A serum folate concentration of less than 3.1 ng/mL is considered to represent clinical deficiency.   T4, free     Status: None   Collection Time: 04/30/17  8:54 AM  Result Value Ref Range   Free T4 1.10 0.82 - 1.77 ng/dL  CBC with Differential/Platelet     Status: Abnormal   Collection Time: 04/30/17  8:54 AM  Result Value Ref Range   WBC 8.0 3.4 - 10.8 x10E3/uL   RBC 4.65 4.14 - 5.80 x10E6/uL    Comment: Few tear drops. Polychromasia present Elliptocytes present.    Hemoglobin 10.0 (L) 13.0 - 17.7 g/dL   Hematocrit 33.9 (L) 37.5 - 51.0 %   MCV 73 (L) 79 - 97 fL   MCH 21.5 (L) 26.6 - 33.0 pg   MCHC 29.5 (L) 31.5 - 35.7 g/dL   RDW 23.1 (H) 12.3 - 15.4 %   Platelets 208 150 - 379 x10E3/uL   Neutrophils 46 Not Estab. %   Lymphs 40 Not Estab. %   Monocytes 6 Not Estab. %   Eos 5 Not Estab. %   Basos 2 Not Estab. %   Neutrophils Absolute 3.8 1.4 - 7.0 x10E3/uL   Lymphocytes Absolute 3.2 (H) 0.7 - 3.1 x10E3/uL   Monocytes Absolute 0.5 0.1 - 0.9 x10E3/uL   EOS (ABSOLUTE) 0.4 0.0 - 0.4 x10E3/uL   Basophils Absolute 0.1 0.0 - 0.2 x10E3/uL   Immature Granulocytes 1 Not Estab. %   Immature Grans (Abs) 0.1 0.0 - 0.1 x10E3/uL   Hematology Comments: Note:     Comment: Verified by microscopic examination.  VITAMIN D 25 Hydroxy (Vit-D Deficiency, Fractures)     Status: Abnormal   Collection Time: 04/30/17  8:54 AM  Result Value Ref Range   Vit D, 25-Hydroxy 27.6 (L) 30.0 - 100.0 ng/mL    Comment: Vitamin D deficiency has been defined by the Institute of Medicine and an Endocrine  Society practice guideline as a level of serum 25-OH vitamin D less than 20 ng/mL (1,2). The Endocrine Society went on to further define vitamin  D insufficiency as a level between 21 and 29 ng/mL (2). 1. IOM (Institute of Medicine). 2010. Dietary reference    intakes for calcium and D. [place]: The    [employer]. 2. Holick MF, Binkley Harrison, Bischoff-Ferrari HA, et al.    Evaluation, treatment, and prevention of vitamin D    deficiency: an Endocrine Society clinical practice    guideline. JCEM. 2011 Jul; 96(7):1911-30.   TSH     Status: Abnormal   Collection Time: 04/30/17  8:54 AM  Result Value Ref Range   TSH 4.890 (H) 0.450 - 4.500 uIU/mL  Lipid panel     Status: Abnormal   Collection Time: 04/30/17  8:54 AM  Result Value Ref Range   Cholesterol, Total 133 100 - 199 mg/dL   Triglycerides 163 (H) 0 - 149 mg/dL   HDL 33 (L) >39 mg/dL   VLDL Cholesterol Cal 33 5 - 40 mg/dL   LDL Calculated 67 0 - 99 mg/dL   Chol/HDL Ratio 4.0 0.0 - 5.0 ratio    Comment:                                   T. Chol/HDL Ratio                                             Men  Women                               1/2 Avg.Risk  3.4    3.3                                   Avg.Risk  5.0    4.4                                2X Avg.Risk  9.6    7.1                                3X Avg.Risk 23.4   11.0   Hemoglobin A1c     Status: Abnormal   Collection Time: 04/30/17  8:54 AM  Result Value Ref Range   Hgb A1c MFr Bld 5.7 (H) 4.8 - 5.6 %    Comment:          Prediabetes: 5.7 - 6.4          Diabetes: >6.4          Glycemic control for adults with diabetes: <7.0    Est. average glucose Bld gHb Est-mCnc 117 mg/dL  Comprehensive metabolic panel     Status: Abnormal   Collection Time: 04/30/17  8:54 AM  Result Value Ref Range   Glucose 84 65 - 99 mg/dL   BUN 13 8 - 27 mg/dL   Creatinine, Ser 1.26 0.76 - 1.27 mg/dL   GFR calc non Af Amer 57 (L) >59 mL/min/1.73   GFR calc  Af Amer 65 >59 mL/min/1.73   BUN/Creatinine Ratio 10 10 - 24   Sodium 140 134 - 144 mmol/L   Potassium 4.6 3.5 - 5.2 mmol/L   Chloride 96 96 - 106 mmol/L    CO2 29 20 - 29 mmol/L   Calcium 9.3 8.6 - 10.2 mg/dL   Total Protein 6.8 6.0 - 8.5 g/dL   Albumin 4.3 3.5 - 4.8 g/dL   Globulin, Total 2.5 1.5 - 4.5 g/dL   Albumin/Globulin Ratio 1.7 1.2 - 2.2   Bilirubin Total 0.3 0.0 - 1.2 mg/dL   Alkaline Phosphatase 83 39 - 117 IU/L   AST 18 0 - 40 IU/L   ALT 15 0 - 44 IU/L

## 2017-05-03 NOTE — Patient Instructions (Signed)
Follow-up 3-4 months for follow-up of your diabetes.  At this time we will check an A1c, recheck vitamin D, TSH and T4.  As we discussed please make sure you are taking her thyroid medicine as far away from any other pills, medicines, supplements as possible.  As mentioned, please try to walk daily using your wheeled walker to a goal of up to 20-30 minutes every day

## 2017-05-04 ENCOUNTER — Ambulatory Visit: Payer: Medicare Other

## 2017-05-06 ENCOUNTER — Ambulatory Visit: Payer: Medicare Other | Admitting: Family Medicine

## 2017-05-27 ENCOUNTER — Encounter: Payer: Self-pay | Admitting: Physician Assistant

## 2017-05-27 ENCOUNTER — Ambulatory Visit: Payer: Medicare Other | Admitting: Physician Assistant

## 2017-05-27 VITALS — BP 118/62 | HR 68 | Ht 70.0 in | Wt 237.0 lb

## 2017-05-27 DIAGNOSIS — D509 Iron deficiency anemia, unspecified: Secondary | ICD-10-CM | POA: Diagnosis not present

## 2017-05-27 DIAGNOSIS — I5033 Acute on chronic diastolic (congestive) heart failure: Secondary | ICD-10-CM | POA: Diagnosis not present

## 2017-05-27 DIAGNOSIS — I10 Essential (primary) hypertension: Secondary | ICD-10-CM

## 2017-05-27 NOTE — Progress Notes (Signed)
Cardiology Office Note   Date:  05/27/2017   ID:  [name] [name], DOB [birth year], MRN [id number]  PCP:  Mellody Dance, DO  Cardiologist:   Dr. Percival Spanish, 03/29/2017 Rosaria Ferries, PA-C 01/11/2017  Chief Complaint  Patient presents with  . Follow-up    pt has noticed some dyspnea Denies chest pain,chest tightness    History of Present Illness: [name] [name] is a 72 y.o. male with a history of MI/CABG in 2001.LHC (04/2007)demonstratedLAD occluded, then 50%, circumflex 90% into a small distal continuation branch, RCA occluded, SVG-RCA patent, LIMA-LAD patent, SVG-OM1/distal circumflex patent. EF 55-60%.Myoview (01/2011)demonstrated no ischemia, EF 68%; normal study. Dobutamine echocardiogram (02/2012)demonstrated a positive ECG changes but no stress-induced wall motion abnormalities on echocardiogram (normal study).Seen in 2014>>05/2016  01/11/2017 office visit, vol status good, weight 226 pounds patient wished to continue ASA 325 mg qd  03/29/2017 office visit, patient activity level was down, he was taking nitroglycerin for symptoms he described as esophageal spasm.  If no other cause for his chronic fatigue is found, he may need a dobutamine stress test  [name] [name] presents for cardiology follow up.  He notices increased DOE and his weight up 7 lbs. Does not feel SOB has changed recently. He is taking Lasix  20 mg qd, takes extra 40 mg prn, has not done that in several weeks. His waistband is tight today.  He denies PND, but may have orthopnea.  He is not short of breath at rest.  His wife does the cooking and there is good compliance with a low-sodium diet.  However, he may be over drinking.  His weight has generally been below 230, but is up now.  He had iron infusions and that helped his fatigue initially. However, he is getting tired again. He follows up with Dr [name] in late May.   No chest pain.   He was having esophageal spasms, mainly after taking his pills.  Severe chest pain. It would run his BP up as well. He is taking his pills 2 or 3 at a time. This is working for him, no more spasms.   He has a rectal fistula and it has recently started bleeding. Partly because of this, they are willing to change ASA to 81 mg qd.   He is not on a statin, reluctant to take it because he takes so many pills. LDL checked 3 weeks ago and was 67.     Past Medical History:  Diagnosis Date  . Anal fistula   . Anemia   . Anxiety    panic attacks  . Arthritis   . CAD (coronary artery disease)    a. s/p CABG 2001; b. LHC (3/09):  S-RCA, L-LAD, S-OM1/dCFX all patent, EF 55-60%;  c. myoview (12/12):  no ischemia, EF 68%;  d. Dob Echo (1/14):  + ECG changes but normal echo => med Rx cont'd  . Cancer [facility])    pt denies  . CHF (congestive heart failure) ([place])   . Complication of anesthesia    CONFUSION, psychosis after surgery for a week  . DDD (degenerative disc disease), lumbar    cervical thoracic and lumbar  . Diabetes mellitus without complication ([place])   . Diverticulitis   . GERD (gastroesophageal reflux disease)   . Gout   . Gout   . History of pneumonia    septic pneumonia  . Hyperlipidemia   . Hypertension   . Hypothyroidism   . IBS (irritable bowel syndrome)   .  Myocardial infarction ([place])   . Osteoporosis   . PTSD (post-traumatic stress disorder)   . PTSD (post-traumatic stress disorder)   . Scoliosis   . Spinal stenosis     Past Surgical History:  Procedure Laterality Date  . APPENDECTOMY    . BLEPHAROPLASTY Bilateral   . CERVICAL FUSION     times 2  . CHOLECYSTECTOMY    . COLOSTOMY REVERSAL  11/05/2015  . COLOSTOMY TAKEDOWN N/A 11/05/2015   Procedure: COLOSTOMY TAKEDOWN;  Surgeon: Georganna Skeans, MD;  Location: [place];  Service: General;  Laterality: N/A;  . CORONARY ARTERY BYPASS GRAFT     2001 LIMA LAD, SVG OM/Circ dista, SVG PDA.  Cath 2009 with patent grafts  . HAND SURGERY Right   . HIP ARTHROPLASTY Right 03/01/2013    Procedure: ARTHROPLASTY BIPOLAR HIP;  Surgeon: Alta Corning, MD;  Location: [facility];  Service: Orthopedics;  Laterality: Right;  . INCISIONAL HERNIA REPAIR N/A 11/05/2015   Procedure: REPAIR INCISIONAL HERNIA;  Surgeon: Georganna Skeans, MD;  Location: [place];  Service: General;  Laterality: N/A;  . INCISIONAL HERNIA REPAIR N/A 07/27/2016   Procedure: HERNIA REPAIR INCISIONAL;  Surgeon: Ralene Ok, MD;  Location: [place];  Service: General;  Laterality: N/A;  . INSERTION OF MESH N/A 07/27/2016   Procedure: INSERTION OF MESH;  Surgeon: Ralene Ok, MD;  Location: [place];  Service: General;  Laterality: N/A;  . JOINT REPLACEMENT     Right knee  . KNEE ARTHROSCOPY Bilateral   . LAPAROTOMY N/A 07/27/2016   Procedure: EXPLORATORY LAPAROTOMY;  Surgeon: Ralene Ok, MD;  Location: [place];  Service: General;  Laterality: N/A;  . LYSIS OF ADHESION N/A 07/27/2016   Procedure: LYSIS OF ADHESION;  Surgeon: Ralene Ok, MD;  Location: [place];  Service: General;  Laterality: N/A;  . PARTIAL COLECTOMY N/A 04/26/2015   Procedure: EXPLORATORY LAPAROTOMY PARTIAL COLECTOMY WITH COLOSTOMY AND HARTMANN'S;  Surgeon: Georganna Skeans, MD;  Location: [place];  Service: General;  Laterality: N/A;  . PLACEMENT OF SETON     seton stitch  . STERNAL WIRE REMOVAL    . TREATMENT FISTULA ANAL     times 3    Current Outpatient Medications  Medication Sig Dispense Refill  . allopurinol (ZYLOPRIM) 300 MG tablet Take 300 mg by mouth at bedtime.     Marland Kitchen aspirin EC 325 MG tablet Take 1 tablet (325 mg total) by mouth 2 (two) times daily after a meal. Take x 1 month post op. (Patient taking differently: Take 325 mg by mouth daily with breakfast. ) 60 tablet 0  . B Complex-C (B-COMPLEX WITH VITAMIN C) tablet Take 1 tablet by mouth daily with breakfast.     . calcium carbonate (TUMS - DOSED IN MG ELEMENTAL CALCIUM) 500 MG chewable tablet Chew 1-3 tablets by mouth 3 (three) times daily as needed for indigestion or heartburn.      . cholecalciferol (VITAMIN D) 1000 units tablet Take 3,000 Units by mouth daily.    . clonazePAM (KLONOPIN) 1 MG tablet Take 1 mg by mouth 3 (three) times daily as needed for anxiety.     . Cobalamine Combinations (J-82 + FOLIC ACID) 5053-976 MCG TBDP Take 1 tablet by mouth daily.    Marland Kitchen docusate sodium (COLACE) 100 MG capsule Take 100 mg by mouth 4 (four) times daily as needed (for constipation).    . furosemide (LASIX) 20 MG tablet Take a 20 mg tablet daily or as directed 90 tablet 3  . gabapentin (NEURONTIN)  300 MG capsule Take 3 capsules (900 mg total) by mouth at bedtime. 270 capsule 1  . Guaifenesin (MUCINEX MAXIMUM STRENGTH) 1200 MG TB12 Take 1 tablet by mouth 2 (two) times daily.    Marland Kitchen HYDROcodone-acetaminophen (NORCO) 10-325 MG tablet Take 1 tablet by mouth every 6 (six) hours as needed (for pain).   0  . hydrocortisone cream 1 % Apply 1 application topically daily as needed for itching (for feet).    Marland Kitchen HYDROmorphone (DILAUDID) 2 MG tablet Take 2 mg by mouth every 12 (twelve) hours as needed for severe pain.    Marland Kitchen levothyroxine (SYNTHROID, LEVOTHROID) 75 MCG tablet Take 75 mcg by mouth daily before breakfast.   3  . losartan (COZAAR) 50 MG tablet Take 1 tablet by mouth daily.  6  . Magnesium 500 MG TABS Take 500 mg by mouth daily with breakfast.    . Melatonin 5 MG CAPS Take 1 capsule by mouth daily.    . metFORMIN (GLUCOPHAGE-XR) 500 MG 24 hr tablet Take 1,000 mg by mouth at bedtime.  1  . metoprolol (LOPRESSOR) 100 MG tablet Take 100 mg by mouth 2 (two) times daily.  0  . nitroGLYCERIN (NITROSTAT) 0.4 MG SL tablet DISSOLVE 1 TABLET UNDER THE TONGUE EVERY 5 MINUTES FOR UP TO 3 DOSES AS NEEDED FOR CHEST PAIN. IF NO RELIEF AFTER 3 DOSES, CALL 911 OR GO TO 25 tablet 2  . omeprazole (PRILOSEC) 20 MG capsule Take 1 capsule (20 mg total) by mouth daily with breakfast. 90 capsule 1  . Polyethyl Glycol-Propyl Glycol (SYSTANE OP) Apply to eye.    . potassium gluconate 595 (99 K) MG TABS tablet Take  595 mg by mouth daily with breakfast.     . Probiotic Product (FLORAJEN3 PO) Take 1 capsule by mouth daily with breakfast.    . rOPINIRole (REQUIP) 0.5 MG tablet Take 1 tablet by mouth daily.  2  . venlafaxine XR (EFFEXOR-XR) 75 MG 24 hr capsule Take 225 mg by mouth at bedtime.  3  . Vitamin D, Ergocalciferol, (DRISDOL) 50000 units CAPS capsule Take 1 capsule (50,000 Units total) by mouth every 7 (seven) days. 12 capsule 10   No current facility-administered medications for this visit.     Allergies:   Ciprofloxacin hcl; Fentanyl; Flomax [tamsulosin hcl]; and Ace inhibitors    Social History:  The patient  reports that he quit smoking about 41 years ago. His smoking use included cigarettes. He has a 45.00 pack-year smoking history. He has never used smokeless tobacco. He reports that he does not drink alcohol or use drugs.   Family History:  The patient's family history includes Colon cancer in his mother; Diabetes in his sister; Heart disease in his brother, brother, brother, father, and sister.    ROS:  Please see the history of present illness. All other systems are reviewed and negative.    PHYSICAL EXAM: VS:  BP 118/62 (BP Location: Left Arm, Patient Position: Sitting, Cuff Size: Normal)   Pulse 68   Ht 5\' 10"  (1.778 m)   Wt 237 lb (107.5 kg)   SpO2 96%   BMI 34.01 kg/m  , BMI Body mass index is 34.01 kg/m. GEN: Well nourished, well developed, male in no acute distress  HEENT: normal for age  Neck:JVD 29 cm, no carotid bruit, no masses Cardiac: RRR; soft murmur, no rubs, or gallops Respiratory: rales bases bilaterally, normal work of breathing GI: abd firm, nontender, nondistended, + BS MS: no deformity or atrophy; no  edema; distal pulses are 2+ in all 4 extremities   Skin: warm and dry, no rash Neuro:  Strength and sensation are intact Psych: euthymic mood, full affect   EKG:  EKG is not ordered today.  ECHO: 08/26/2014 - Left ventricle: The cavity size was normal.  Wall thickness was   normal. Systolic function was normal. The estimated ejection   fraction was in the range of 55% to 60%. Wall motion was normal;   there were no regional wall motion abnormalities. Features are   consistent with a pseudonormal left ventricular filling pattern,   with concomitant abnormal relaxation and increased filling   pressure (grade 2 diastolic dysfunction). - Aortic valve: There was mild regurgitation. - Left atrium: The atrium was mildly dilated.  Recent Labs: 08/14/2016: B Natriuretic Peptide 627.9 04/30/2017: ALT 15; BUN 13; Creatinine, Ser 1.26; Hemoglobin 10.0; Platelets 208; Potassium 4.6; Sodium 140; TSH 4.890    Lipid Panel    Component Value Date/Time   CHOL 133 04/30/2017 0854   TRIG 163 (H) 04/30/2017 0854   HDL 33 (L) 04/30/2017 0854   CHOLHDL 4.0 04/30/2017 0854   CHOLHDL 4.2 04/28/2015 0529   VLDL 35 04/28/2015 0529   LDLCALC 67 04/30/2017 0854     Wt Readings from Last 3 Encounters:  05/27/17 237 lb (107.5 kg)  05/03/17 230 lb (104.3 kg)  04/07/17 229 lb 3.2 oz (104 kg)     Other studies Reviewed: Additional studies/ records that were reviewed today include: Office notes, hospital records and testing.  ASSESSMENT AND PLAN:  1.  Acute on chronic diastolic CHF: He needs to take extra Lasix and potassium 2 out of the next 4 days.  His last potassium and creatinine were 04/30/2017.  Potassium 4.6 and creatinine 1.26.  We will not recheck today.  He is encouraged to be more conscious of the amount of liquids he drinks.  Limit fluids to 2 L daily.  Make sure he weighs every day as he has been missing some days.  Contact us if his weight does not return to baseline, otherwise follow-up in 3 months.  2.  Hypertension: His blood pressures under good control on current medical therapy.  3.  Iron deficiency anemia: Because of his worsening fatigue, I will go ahead and check a CBC today.  Decrease aspirin to 81 mg daily.   Current medicines are  reviewed at length with the patient today.  The patient does not have concerns regarding medicines.  The following changes have been made: Temporarily increase Lasix and potassium  Labs/ tests ordered today include:   Orders Placed This Encounter  Procedures  . CBC w/Diff/Platelet     Disposition:   FU with Dr Percival Spanish  Signed, Rosaria Ferries, PA-C  05/27/2017 4:36 PM    [facility] Phone: [phone]; Fax: [phone]  This note was written with the assistance of speech recognition software. Please excuse any transcriptional errors.

## 2017-05-27 NOTE — Patient Instructions (Addendum)
Take 2 out of next 4 days your higher dose of Lasix ( 20 mg 3 tablets )   Decrease Aspirin to 81 mg daily   Weigh daily   No more than 2 liters of fluid daily   Lab work today ( cbc )    Your physician recommends that you schedule a follow-up appointment in: 3 months with Dr.Hochrein.

## 2017-05-28 LAB — CBC WITH DIFFERENTIAL/PLATELET
BASOS: 1 %
Basophils Absolute: 0.1 10*3/uL (ref 0.0–0.2)
EOS (ABSOLUTE): 0.3 10*3/uL (ref 0.0–0.4)
EOS: 3 %
HEMATOCRIT: 40.3 % (ref 37.5–51.0)
Hemoglobin: 12.4 g/dL — ABNORMAL LOW (ref 13.0–17.7)
Immature Grans (Abs): 0 10*3/uL (ref 0.0–0.1)
Immature Granulocytes: 0 %
Lymphocytes Absolute: 4.1 10*3/uL — ABNORMAL HIGH (ref 0.7–3.1)
Lymphs: 42 %
MCH: 25.5 pg — ABNORMAL LOW (ref 26.6–33.0)
MCHC: 30.8 g/dL — ABNORMAL LOW (ref 31.5–35.7)
MCV: 83 fL (ref 79–97)
MONOCYTES: 8 %
MONOS ABS: 0.7 10*3/uL (ref 0.1–0.9)
Neutrophils Absolute: 4.6 10*3/uL (ref 1.4–7.0)
Neutrophils: 46 %
Platelets: 267 10*3/uL (ref 150–379)
RBC: 4.86 x10E6/uL (ref 4.14–5.80)
WBC: 9.7 10*3/uL (ref 3.4–10.8)

## 2017-06-09 ENCOUNTER — Ambulatory Visit: Payer: Medicare Other | Admitting: Family Medicine

## 2017-06-09 ENCOUNTER — Encounter: Payer: Self-pay | Admitting: Family Medicine

## 2017-06-09 VITALS — BP 124/78 | HR 62 | Ht 70.0 in | Wt 237.0 lb

## 2017-06-09 DIAGNOSIS — M2012 Hallux valgus (acquired), left foot: Secondary | ICD-10-CM

## 2017-06-09 DIAGNOSIS — M21612 Bunion of left foot: Secondary | ICD-10-CM

## 2017-06-09 DIAGNOSIS — B351 Tinea unguium: Secondary | ICD-10-CM | POA: Diagnosis not present

## 2017-06-09 DIAGNOSIS — M1A00X0 Idiopathic chronic gout, unspecified site, without tophus (tophi): Secondary | ICD-10-CM

## 2017-06-09 NOTE — Progress Notes (Signed)
Pt here for an acute care OV today   Impression and Recommendations:    1. Chronic gouty arthropathy- L 1st toe   2. Bunion of great toe of left foot   3. Hallux valgus (acquired), left foot   4. Toenail fungus     1. Concerns about Chronic Changes in Left Great Toe - Emphasized that patient should be seeing podiatry regularly given his diagnosis of diabetes.  - Reviewed that his left big toe is a chronic issue that should also be monitored by podiatry.  - Reviewed that patient should schedule follow up with podiatry to assess the state of his left great toe and alleviate concerns.  - For any continued gout flares, recommended that the patient continue on allopurinol.  - Advised patient to stay away from purine rich foods such as red meat and beer.  2. Follow-Up - Return for chronic OV as scheduled. - Patient knows to let us know in the meantime if new concerns arise.   Education and routine counseling performed. Handouts provided  Gross side effects, risk and benefits, and alternatives of medications and treatment plan in general discussed with patient.  Patient is aware that all medications have potential side effects and we are unable to predict every side effect or drug-drug interaction that may occur.   Patient will call with any questions prior to using medication if they have concerns.  Expresses verbal understanding and consents to current therapy and treatment regimen.  No barriers to understanding were identified.  Red flag symptoms and signs discussed in detail.  Patient expressed understanding regarding what to do in case of emergency\urgent symptoms   Please see AVS handed out to patient at the end of our visit for further patient instructions/ counseling done pertaining to today's office visit.   Return for Please keep your chronic follow-up office visit in future.     Note: This document was prepared occasionally using Dragon voice recognition software and  may include unintentional dictation errors in addition to a scribe.  This document serves as a record of services personally performed by Mellody Dance, DO. It was created on her behalf by Toni Amend, a trained medical scribe. The creation of this record is based on the scribe's personal observations and the provider's statements to them.   I have reviewed the above medical documentation for accuracy and completeness and I concur.  Mellody Dance 06/17/17 2:30 PM   ----------------------------------------------------------------------------------------------------------------------------------------------------------------------   Subjective:    CC:  Chief Complaint  Patient presents with  . Toe Pain    left great toe redness and skin peeling x 10-14 days     HPI: [name] [name] is a 72 y.o. male who presents to [facility] today for issues as discussed below.  At start of visit, patient notes that he has gout.  "I've had gout since I was a baby I think."  Takes allopurinol to treat this.  Left Great Toe - Chronic Changes Noticed about a month ago that his left great toe started hurting.  At that time, he was experiencing a gout flare.  He took an extra allopurinol and the pain was alleviated.  Noted that his toe was still red afterward, but he doubled up the dose of allopurinol for a couple of days.  The pain completely resolved.  He notes that this left great toe has chronic changes.  It is permanently bent, and has been like that for 3-4 years.  Recently his toe started peeling at the end.  He is concerned that he might lose the toenail now, as he had once before in the past.  The patient refers to concerns that arose after speaking to a friend about fear of amputation.    Patient confirms that his left great toe is no longer acutely achy.  He confirms that he knows the difference between how it feels at present versus how it feels with gout,  and he is not experiencing gout in that toe at present.  No problems updated.   Wt Readings from Last 3 Encounters:  06/09/17 237 lb (107.5 kg)  05/27/17 237 lb (107.5 kg)  05/03/17 230 lb (104.3 kg)   BP Readings from Last 3 Encounters:  06/09/17 124/78  05/27/17 118/62  05/03/17 129/80   BMI Readings from Last 3 Encounters:  06/09/17 34.01 kg/m  05/27/17 34.01 kg/m  05/03/17 33.00 kg/m     Patient Care Team    Relationship Specialty Notifications Start End  Mellody Dance, DO PCP - General Family Medicine  03/30/17   Minus Breeding, MD PCP - Cardiology Cardiology Admissions 05/26/17   Minus Breeding, MD Consulting Physician Cardiology  04/01/17   Dorna Leitz, MD Consulting Physician Orthopedic Surgery  04/01/17   Idamae Lusher, MD  Ophthalmology  04/01/17   Clydell Hakim, MD Consulting Physician Anesthesiology  04/01/17    Comment: pain mgt-  chronic pain syn.   Allyn Kenner, MD  Dermatology  04/01/17    Comment: bowen's disease on chest- cryo txmnt  Ralene Ok, MD Consulting Physician General Surgery  04/01/17   Fanny Skates, MD Consulting Physician General Surgery  04/01/17   Georganna Skeans, MD Consulting Physician General Surgery  04/01/17   Wonda Horner, MD Consulting Physician Gastroenterology  04/01/17      Patient Active Problem List   Diagnosis Date Noted  . Hypertension associated with diabetes ([place]) 04/01/2017    Priority: High  . Mixed diabetic hyperlipidemia associated with type 2 diabetes mellitus ([place]) 04/01/2017    Priority: High  . Type 2 diabetes mellitus with complication (HCC)     Priority: High  . Acute on chronic renal failure ([place]) 08/25/2014    Priority: High  . Anemia 04/07/2017    Priority: Medium  . CHF (congestive heart failure) ([place]) 04/01/2017    Priority: Medium  . History of smoking 30 or more pack years 04/01/2017    Priority: Medium  . Mood disorder ([place])- mixed PTSD and depression 04/26/2015    Priority: Medium  .  GERD (gastroesophageal reflux disease) 04/26/2015    Priority: Medium  . Insomnia 02/06/2011    Priority: Medium  . CAD (coronary artery disease)     Priority: Medium  . Hypothyroidism 04/26/2015    Priority: Low  . Hypokalemia 08/24/2014    Priority: Low  . Alcohol abuse 08/24/2014    Priority: Low  . Chronic gouty arthropathy- L 1st toe 06/09/2017  . Bunion of great toe of left foot 06/09/2017  . Hallux valgus (acquired), left foot 06/09/2017  . Toenail fungus 06/09/2017  . Fatigue 04/01/2017  . S/P hernia repair 07/27/2016  . Preop cardiovascular exam 05/30/2016  . S/P colostomy takedown 11/05/2015  . Abdominal pain   . Agitation   . Threatening to others   . Diverticulitis of large intestine with perforation without bleeding   . CAD in native artery   . Diverticulitis 04/26/2015  . Perforated diverticulum of large intestine 04/26/2015  . Diverticulitis of  colon with perforation 04/26/2015  . Acute encephalopathy 08/25/2014  . Blood poisoning   . Elevated troponin   . CAP (community acquired pneumonia) 08/24/2014  . Sepsis ([place]) 08/24/2014  . AKI (acute kidney injury) ([place]) 08/24/2014  . DM (diabetes mellitus), type 2 ([place]) 02/28/2013  . Abnormal EKG 02/28/2013  . Fistula-in-ano 11/05/2011  . Hyperlipidemia 02/06/2011  . Hypertension     Past Medical history, Surgical history, Family history, Social history, Allergies and Medications have been entered into the medical record, reviewed and changed as needed.    Current Meds  Medication Sig  . allopurinol (ZYLOPRIM) 300 MG tablet Take 300 mg by mouth at bedtime.   Marland Kitchen aspirin EC 81 MG tablet Take 1 tablet (81 mg total) by mouth daily.  . B Complex-C (B-COMPLEX WITH VITAMIN C) tablet Take 1 tablet by mouth daily with breakfast.   . calcium carbonate (TUMS - DOSED IN MG ELEMENTAL CALCIUM) 500 MG chewable tablet Chew 1-3 tablets by mouth 3 (three) times daily as needed for indigestion or heartburn.  . cholecalciferol  (VITAMIN D) 1000 units tablet Take 3,000 Units by mouth daily.  . clonazePAM (KLONOPIN) 1 MG tablet Take 1 mg by mouth 3 (three) times daily as needed for anxiety.   . Cobalamine Combinations (K-02 + FOLIC ACID) 5427-062 MCG TBDP Take 1 tablet by mouth daily.  Marland Kitchen docusate sodium (COLACE) 100 MG capsule Take 100 mg by mouth 4 (four) times daily as needed (for constipation).  . furosemide (LASIX) 20 MG tablet Take a 20 mg tablet daily or as directed  . gabapentin (NEURONTIN) 300 MG capsule Take 3 capsules (900 mg total) by mouth at bedtime.  . Guaifenesin (MUCINEX MAXIMUM STRENGTH) 1200 MG TB12 Take 1 tablet by mouth 2 (two) times daily.  Marland Kitchen HYDROcodone-acetaminophen (NORCO) 10-325 MG tablet Take 1 tablet by mouth every 6 (six) hours as needed (for pain).   . hydrocortisone cream 1 % Apply 1 application topically daily as needed for itching (for feet).  Marland Kitchen HYDROmorphone (DILAUDID) 2 MG tablet Take 2 mg by mouth every 12 (twelve) hours as needed for severe pain.  Marland Kitchen levothyroxine (SYNTHROID, LEVOTHROID) 75 MCG tablet Take 75 mcg by mouth daily before breakfast.   . losartan (COZAAR) 50 MG tablet Take 1 tablet by mouth daily.  . Magnesium 500 MG TABS Take 500 mg by mouth daily with breakfast.  . Melatonin 5 MG CAPS Take 1 capsule by mouth daily.  . metFORMIN (GLUCOPHAGE-XR) 500 MG 24 hr tablet Take 1,000 mg by mouth at bedtime.  . metoprolol (LOPRESSOR) 100 MG tablet Take 100 mg by mouth 2 (two) times daily.  . nitroGLYCERIN (NITROSTAT) 0.4 MG SL tablet DISSOLVE 1 TABLET UNDER THE TONGUE EVERY 5 MINUTES FOR UP TO 3 DOSES AS NEEDED FOR CHEST PAIN. IF NO RELIEF AFTER 3 DOSES, CALL 911 OR GO TO  . omeprazole (PRILOSEC) 20 MG capsule Take 1 capsule (20 mg total) by mouth daily with breakfast.  . Polyethyl Glycol-Propyl Glycol (SYSTANE OP) Apply to eye.  . potassium gluconate 595 (99 K) MG TABS tablet Take 595 mg by mouth daily with breakfast.   . Probiotic Product (FLORAJEN3 PO) Take 1 capsule by mouth daily  with breakfast.  . rOPINIRole (REQUIP) 0.5 MG tablet Take 1 tablet by mouth daily.  Marland Kitchen venlafaxine XR (EFFEXOR-XR) 75 MG 24 hr capsule Take 225 mg by mouth at bedtime.  . Vitamin D, Ergocalciferol, (DRISDOL) 50000 units CAPS capsule Take 1 capsule (50,000 Units total) by mouth every  7 (seven) days.    Allergies:  Allergies  Allergen Reactions  . Ciprofloxacin Hcl Other (See Comments)    Causes pain in tendons, feels like muscle spasms. BLACK BOX WARNING RE: POSSIBLE TENDON RUPTURE, MYALGIAS, and OTHERS  . Fentanyl Other (See Comments)    Pt can handle having Fentanyl under anesthesia - does not tolerate after surgery for post op pain - causes mood swings, confusion, and agitation    . Flomax [Tamsulosin Hcl] Other (See Comments)    DROPS BLOOD PRESSURE  . Ace Inhibitors Cough     Review of Systems: General:   Denies fever, chills, unexplained weight loss.  Optho/Auditory:   Denies visual changes, blurred vision/LOV Respiratory:   Denies wheeze, DOE more than baseline levels.  Cardiovascular:   Denies chest pain, palpitations, new onset peripheral edema  Gastrointestinal:   Denies nausea, vomiting, diarrhea, abd pain.  Genitourinary: Denies dysuria, freq/ urgency, flank pain or discharge from genitals.  Endocrine:     Denies hot or cold intolerance, polyuria, polydipsia. Musculoskeletal:   Denies unexplained myalgias, joint swelling, unexplained arthralgias, gait problems.  Skin:  Denies new onset rash, suspicious lesions Neurological:     Denies dizziness, unexplained weakness, numbness  Psychiatric/Behavioral:   Denies mood changes, suicidal or homicidal ideations, hallucinations    Objective:   Blood pressure 124/78, pulse 62, height 5\' 10"  (1.778 m), weight 237 lb (107.5 kg), SpO2 97 %. Body mass index is 34.01 kg/m. General:  Well Developed, well nourished, appropriate for stated age.  Neuro:  Alert and oriented,  extra-ocular muscles intact  HEENT:  Normocephalic,  atraumatic, neck supple Skin:  no gross rash, warm, pink. Cardiac:  RRR, S1 S2 Respiratory:  ECTA B/L and A/P, Not using accessory muscles, speaking in full sentences- unlabored. Vascular:  Ext warm, no cyanosis apprec.; cap RF less 2 sec. Psych:  No HI/SI, judgement and insight good, Euthymic mood. Full Affect. Left Great Toe:  Prominence of bunion with hallux valgus left big toe.  Evidence of thickening toenail with yellowing.  Skin peeling distally, scant amount.  No evidence of infection, erythema, warmth, or TTP.

## 2017-06-09 NOTE — Patient Instructions (Signed)
Please follow-up with podiatry on a yearly basis or more frequently.  They have the ones that can help you with the toenail as well as routine foot care and any problems with your bunion that you are having.  Please remember importance of great supportive shoes is very important in diabetics  -Continue allopurinol which is working great to keep your gout in check.  Please follow low purine diet which will help prevent acute flareups.

## 2017-07-05 ENCOUNTER — Other Ambulatory Visit: Payer: Self-pay

## 2017-07-05 NOTE — Telephone Encounter (Signed)
Pharmacy sent refill request for Clonazepam.  Medication last filled by pervious provider. Sent to Dr. Raliegh Scarlet to review.  LOV 06/09/17. MPulliam, CMA/RT(R)

## 2017-07-06 ENCOUNTER — Inpatient Hospital Stay: Payer: Medicare Other | Attending: Oncology

## 2017-07-06 ENCOUNTER — Inpatient Hospital Stay (HOSPITAL_BASED_OUTPATIENT_CLINIC_OR_DEPARTMENT_OTHER): Payer: Medicare Other | Admitting: Oncology

## 2017-07-06 ENCOUNTER — Telehealth: Payer: Self-pay | Admitting: Oncology

## 2017-07-06 VITALS — BP 142/72 | HR 69 | Temp 97.9°F | Resp 18 | Ht 70.0 in | Wt 237.3 lb

## 2017-07-06 DIAGNOSIS — D509 Iron deficiency anemia, unspecified: Secondary | ICD-10-CM | POA: Insufficient documentation

## 2017-07-06 DIAGNOSIS — D649 Anemia, unspecified: Secondary | ICD-10-CM

## 2017-07-06 LAB — CBC WITH DIFFERENTIAL (CANCER CENTER ONLY)
BASOS ABS: 0.1 10*3/uL (ref 0.0–0.1)
Basophils Relative: 1 %
EOS ABS: 0.2 10*3/uL (ref 0.0–0.5)
EOS PCT: 2 %
HCT: 39.5 % (ref 38.4–49.9)
Hemoglobin: 12.8 g/dL — ABNORMAL LOW (ref 13.0–17.1)
LYMPHS PCT: 42 %
Lymphs Abs: 3.2 10*3/uL (ref 0.9–3.3)
MCH: 27.9 pg (ref 27.2–33.4)
MCHC: 32.5 g/dL (ref 32.0–36.0)
MCV: 85.9 fL (ref 79.3–98.0)
MONO ABS: 0.6 10*3/uL (ref 0.1–0.9)
Monocytes Relative: 9 %
Neutro Abs: 3.5 10*3/uL (ref 1.5–6.5)
Neutrophils Relative %: 46 %
PLATELETS: 264 10*3/uL (ref 140–400)
RBC: 4.6 MIL/uL (ref 4.20–5.82)
RDW: 21.8 % — AB (ref 11.0–14.6)
WBC Count: 7.5 10*3/uL (ref 4.0–10.3)

## 2017-07-06 LAB — IRON AND TIBC
IRON: 32 ug/dL — AB (ref 42–163)
SATURATION RATIOS: 9 % — AB (ref 42–163)
TIBC: 360 ug/dL (ref 202–409)
UIBC: 328 ug/dL

## 2017-07-06 LAB — FERRITIN: FERRITIN: 20 ng/mL — AB (ref 22–316)

## 2017-07-06 NOTE — Telephone Encounter (Signed)
Scheduled appt per 5/21 los - pt aware of appt date and time.

## 2017-07-06 NOTE — Progress Notes (Signed)
Hematology and Oncology Follow Up Visit  [name] [name] [id number] 01-Sep-1945 72 y.o. 07/06/2017 2:32 PM Opalski, Para Skeans, DO   Principle Diagnosis: 72 year old man with iron deficiency anemia diagnosed in February 2019.  At that time his iron level was 13 and ferritin of 8.  His hemoglobin was 8.1.   Prior Therapy: IV iron therapy in the form of Feraheme.  He completed 1000 mg in March 2019.  Current therapy: Active surveillance.  Interim History: [name] [name] presents today for a follow-up visit.  Since the last visit, he received intravenous iron without complications.  He had a excellent response with improvement in his hemoglobin up to 12.4 in April 2019.  He noticed a significant improvement in his activity level and performance status.  He reported increased energy without any dyspnea on exertion.  He denies any hematochezia, melena or hemoptysis.  He attends activities of daily living without any decline in ability to do so.  He does not report any headaches, blurry vision, syncope or seizures. Does not report any fevers, chills or sweats.  Does not report any cough, wheezing or hemoptysis.  Does not report any chest pain, palpitation, orthopnea or leg edema.  Does not report any nausea, vomiting or abdominal pain.  Does not report any constipation or diarrhea.  Does not report any skeletal complaints.    Does not report frequency, urgency or hematuria.  Does not report any skin rashes or lesions. Does not report any heat or cold intolerance.  Does not report any lymphadenopathy or petechiae.  Does not report any anxiety or depression.  Remaining review of systems is negative.    Medications: I have reviewed the patient's current medications.  Current Outpatient Medications  Medication Sig Dispense Refill  . allopurinol (ZYLOPRIM) 300 MG tablet Take 300 mg by mouth at bedtime.     Marland Kitchen aspirin EC 81 MG tablet Take 1 tablet (81 mg total) by mouth daily. 90 tablet 3  . B  Complex-C (B-COMPLEX WITH VITAMIN C) tablet Take 1 tablet by mouth daily with breakfast.     . calcium carbonate (TUMS - DOSED IN MG ELEMENTAL CALCIUM) 500 MG chewable tablet Chew 1-3 tablets by mouth 3 (three) times daily as needed for indigestion or heartburn.    . cholecalciferol (VITAMIN D) 1000 units tablet Take 3,000 Units by mouth daily.    . clonazePAM (KLONOPIN) 1 MG tablet Take 1 mg by mouth 3 (three) times daily as needed for anxiety.     . Cobalamine Combinations (O-45 + FOLIC ACID) 9977-414 MCG TBDP Take 1 tablet by mouth daily.    Marland Kitchen docusate sodium (COLACE) 100 MG capsule Take 100 mg by mouth 4 (four) times daily as needed (for constipation).    . furosemide (LASIX) 20 MG tablet Take a 20 mg tablet daily or as directed 90 tablet 3  . gabapentin (NEURONTIN) 300 MG capsule Take 3 capsules (900 mg total) by mouth at bedtime. 270 capsule 1  . Guaifenesin (MUCINEX MAXIMUM STRENGTH) 1200 MG TB12 Take 1 tablet by mouth 2 (two) times daily.    Marland Kitchen HYDROcodone-acetaminophen (NORCO) 10-325 MG tablet Take 1 tablet by mouth every 6 (six) hours as needed (for pain).   0  . hydrocortisone cream 1 % Apply 1 application topically daily as needed for itching (for feet).    Marland Kitchen HYDROmorphone (DILAUDID) 2 MG tablet Take 2 mg by mouth every 12 (twelve) hours as needed for severe pain.    Marland Kitchen levothyroxine (SYNTHROID, LEVOTHROID) 75  MCG tablet Take 75 mcg by mouth daily before breakfast.   3  . losartan (COZAAR) 50 MG tablet Take 1 tablet by mouth daily.  6  . Magnesium 500 MG TABS Take 500 mg by mouth daily with breakfast.    . Melatonin 5 MG CAPS Take 1 capsule by mouth daily.    . metFORMIN (GLUCOPHAGE-XR) 500 MG 24 hr tablet Take 1,000 mg by mouth at bedtime.  1  . metoprolol (LOPRESSOR) 100 MG tablet Take 100 mg by mouth 2 (two) times daily.  0  . nitroGLYCERIN (NITROSTAT) 0.4 MG SL tablet DISSOLVE 1 TABLET UNDER THE TONGUE EVERY 5 MINUTES FOR UP TO 3 DOSES AS NEEDED FOR CHEST PAIN. IF NO RELIEF AFTER 3  DOSES, CALL 911 OR GO TO 25 tablet 2  . omeprazole (PRILOSEC) 20 MG capsule Take 1 capsule (20 mg total) by mouth daily with breakfast. 90 capsule 1  . Polyethyl Glycol-Propyl Glycol (SYSTANE OP) Apply to eye.    . potassium gluconate 595 (99 K) MG TABS tablet Take 595 mg by mouth daily with breakfast.     . Probiotic Product (FLORAJEN3 PO) Take 1 capsule by mouth daily with breakfast.    . rOPINIRole (REQUIP) 0.5 MG tablet Take 1 tablet by mouth daily.  2  . venlafaxine XR (EFFEXOR-XR) 75 MG 24 hr capsule Take 225 mg by mouth at bedtime.  3  . Vitamin D, Ergocalciferol, (DRISDOL) 50000 units CAPS capsule Take 1 capsule (50,000 Units total) by mouth every 7 (seven) days. 12 capsule 10   No current facility-administered medications for this visit.      Allergies:  Allergies  Allergen Reactions  . Ciprofloxacin Hcl Other (See Comments)    Causes pain in tendons, feels like muscle spasms. BLACK BOX WARNING RE: POSSIBLE TENDON RUPTURE, MYALGIAS, and OTHERS  . Fentanyl Other (See Comments)    Pt can handle having Fentanyl under anesthesia - does not tolerate after surgery for post op pain - causes mood swings, confusion, and agitation    . Flomax [Tamsulosin Hcl] Other (See Comments)    DROPS BLOOD PRESSURE  . Ace Inhibitors Cough    Past Medical History, Surgical history, Social history, and Family History were reviewed and updated.   Physical Exam:   ECOG: 1 General appearance: alert and cooperative appeared without distress. Head: Normocephalic, without obvious abnormality Oropharynx: No oral thrush or ulcers. Eyes: No scleral icterus.  Pupils are equal and round reactive to light. Lymph nodes: Cervical, supraclavicular, and axillary nodes normal. Heart:regular rate and rhythm, S1, S2 normal, no murmur, click, rub or gallop Lung:chest clear, no wheezing, rales, normal symmetric air entry Abdomin: soft, non-tender, without masses or organomegaly. Neurological: No motor, sensory  deficits.  Intact deep tendon reflexes. Skin: No rashes or lesions.  No ecchymosis or petechiae. Musculoskeletal: No joint deformity or effusion. Psychiatric: Mood and affect are appropriate.    Lab Results: Lab Results  Component Value Date   WBC 9.7 05/27/2017   HGB 12.4 (L) 05/27/2017   HCT 40.3 05/27/2017   MCV 83 05/27/2017   PLT 267 05/27/2017     Chemistry      Component Value Date/Time   NA 140 04/30/2017 0854   K 4.6 04/30/2017 0854   CL 96 04/30/2017 0854   CO2 29 04/30/2017 0854   BUN 13 04/30/2017 0854   CREATININE 1.26 04/30/2017 0854   CREATININE 1.18 04/07/2017 1437   GLU 104 12/15/2016      Component Value Date/Time  CALCIUM 9.3 04/30/2017 0854   ALKPHOS 83 04/30/2017 0854   AST 18 04/30/2017 0854   AST 11 04/07/2017 1437   ALT 15 04/30/2017 0854   ALT 12 04/07/2017 1437   BILITOT 0.3 04/30/2017 0854   BILITOT 0.3 04/07/2017 1437     Results for [name], [name] [name] (MRN [id number]) as of 07/06/2017 14:29  Ref. Range 04/07/2017 14:37 04/30/2017 08:54  Iron Latest Ref Range: 38 - 169 ug/dL 13 (L) 175 (H)  UIBC Latest Ref Range: 111 - 343 ug/dL 449 193  TIBC Latest Ref Range: 250 - 450 ug/dL 462 (H) 368  Saturation Ratios Latest Ref Range: 17.9 - 39.5 % 3 (L)   Ferritin Latest Ref Range: 30 - 400 ng/mL 8 (L) 565 (H)  Iron Saturation Latest Ref Range: 15 - 55 %  48  Transferrin Latest Ref Range: 200 - 370 mg/dL  255  Folate Latest Ref Range: >3.0 ng/mL  >20.0    Impression and Plan:  72 year old man with:  1.    Iron deficiency anemia diagnosed  February 2019.  His hemoglobin was 8.1 with an MCV of 72.   His iron level was 13 with ferritin of 8. His anemia work-up did not reveal any etiology at that time.  He received intravenous iron in February 2019 with normalization of his iron studies in March 2019.  His hemoglobin today is close to normal range without any need for further iron replacement.  His iron deficiency etiology is unclear but likely  related to chronic blood losses.  The plan is to continue with active surveillance and repeat iron studies in 6 months to ensure stability.   2.  Colon cancer screening: He is scheduled for colonoscopy next week for evaluation for possible blood loss sources.  3.  Follow-up: We will be in 6 months.  15  minutes was spent with the patient face-to-face today.  More than 50% of time was dedicated to patient counseling, education and coordination of his care.     Zola Button, MD 5/21/20192:32 PM

## 2017-07-07 ENCOUNTER — Telehealth: Payer: Self-pay | Admitting: Family Medicine

## 2017-07-07 NOTE — Telephone Encounter (Signed)
Patient is requesting a refill of his clonazepam, if approved please send to [facility]

## 2017-07-08 ENCOUNTER — Other Ambulatory Visit: Payer: Self-pay | Admitting: Family Medicine

## 2017-07-08 MED ORDER — CLONAZEPAM 1 MG PO TABS: 1.0000 mg | ORAL_TABLET | Freq: Two times a day (BID) | ORAL | 0 refills | Status: DC | PRN

## 2017-07-08 NOTE — Progress Notes (Signed)
After speaking with my CMA Melissa who spoke with wife about pt-  She says that they are calling hospice into the home for terminal care as pt's conditions have continued to deteriorate.  Since he is near end of life and has been on this medicine for many years, I feel risks of meds are less than benefits he gets from them and I oked RF.

## 2017-07-08 NOTE — Telephone Encounter (Signed)
Additional information, patient's wife states that he is terminal and that patient close to needing or already needing hospice care. Patient's wife is trying to keep him comfortable and is concerned with the effects of coming of the medication.

## 2017-07-08 NOTE — Telephone Encounter (Signed)
Spoke to the patient's wife and she states that the patient is on the clonazepam for PTSD and has been on the medication for years.  Patient takes the medication 3 times daily.  Wife is concerned with patient coming off the medication. It is difficulty for the patient to get to the [place] and this is a reason that patient needed to establish with a local PCP.  Also verified that the patient is taking his Effexor daily, patient is and still had refills from his pervious provider. Please advise. MPulliam, CMA/RT(R)

## 2017-08-05 ENCOUNTER — Ambulatory Visit: Payer: Medicare Other | Admitting: Family Medicine

## 2017-08-05 ENCOUNTER — Encounter: Payer: Self-pay | Admitting: Family Medicine

## 2017-08-05 VITALS — BP 108/70 | HR 66 | Ht 72.0 in | Wt 242.7 lb

## 2017-08-05 DIAGNOSIS — E1169 Type 2 diabetes mellitus with other specified complication: Secondary | ICD-10-CM

## 2017-08-05 DIAGNOSIS — G4700 Insomnia, unspecified: Secondary | ICD-10-CM

## 2017-08-05 DIAGNOSIS — E038 Other specified hypothyroidism: Secondary | ICD-10-CM | POA: Diagnosis not present

## 2017-08-05 DIAGNOSIS — N179 Acute kidney failure, unspecified: Secondary | ICD-10-CM | POA: Diagnosis not present

## 2017-08-05 DIAGNOSIS — I10 Essential (primary) hypertension: Secondary | ICD-10-CM | POA: Diagnosis not present

## 2017-08-05 DIAGNOSIS — F4312 Post-traumatic stress disorder, chronic: Secondary | ICD-10-CM | POA: Diagnosis not present

## 2017-08-05 DIAGNOSIS — F39 Unspecified mood [affective] disorder: Secondary | ICD-10-CM | POA: Diagnosis not present

## 2017-08-05 DIAGNOSIS — I509 Heart failure, unspecified: Secondary | ICD-10-CM | POA: Diagnosis not present

## 2017-08-05 DIAGNOSIS — N183 Chronic kidney disease, stage 3 (moderate): Secondary | ICD-10-CM | POA: Diagnosis not present

## 2017-08-05 DIAGNOSIS — E1159 Type 2 diabetes mellitus with other circulatory complications: Secondary | ICD-10-CM

## 2017-08-05 DIAGNOSIS — E782 Mixed hyperlipidemia: Secondary | ICD-10-CM

## 2017-08-05 DIAGNOSIS — E118 Type 2 diabetes mellitus with unspecified complications: Secondary | ICD-10-CM | POA: Diagnosis not present

## 2017-08-05 DIAGNOSIS — I152 Hypertension secondary to endocrine disorders: Secondary | ICD-10-CM

## 2017-08-05 MED ORDER — CLONAZEPAM 1 MG PO TABS: 1.0000 mg | ORAL_TABLET | Freq: Two times a day (BID) | ORAL | 0 refills | Status: DC | PRN

## 2017-08-05 NOTE — Patient Instructions (Signed)
. Supporting Someone With Post-Traumatic Stress Disorder Post-traumatic stress disorder (PTSD) is a mental health disorder that can develop after a traumatic event, such as a threat to life, a serious injury, sexual violence, or any type of abuse. When a person has PTSD, his or her condition can affect others around him or her, such as friends and family members. Friends and family can help by offering support and understanding. What do I need to know about this condition? Experiencing a traumatic event can cause some people to develop PTSD. Sometimes, PTSD can occur in people who hear about trauma that happens to a close family member or friend. PTSD can be present for anyone at any age. PTSD symptoms may start soon after a frightening event, or months or years later. Symptoms last at least one month, and they tend to disrupt relationships, work or school, and daily activities. Symptoms may include:  Re-experiencing the traumatic event through: ? Dreams. ? Feelings of fear, terror, sadness, or anger. ? Unwanted memories. ? Physical symptoms, such as rapid heartbeat, shallow breathing, sweating, or shaking. ? Flashbacks, or feeling like the past event is happening in the present.  Avoidance of reminders of the event. This may involve: ? Decreased interest or participation in daily activities. ? Avoidance of people or loss of connection with them.  Being very sensitive or having reactions to surroundings. The person with PTSD may: ? Be easily startled. ? Engage in self-destructive behavior. ? Be irritable. ? Be highly stressed or feel "on edge." ? Have outbursts toward others. ? Have trouble concentrating or sleeping.  Negative moods and thoughts. These may involve: ? Negative thoughts about self and others. ? Stressful and negative feelings without a clear cause. ? Inability to remember parts of the event. ? Blaming oneself or others. ? Trouble experiencing positive feelings or  events.  What do I need to know about the treatment options? Treatment for PTSD may include a combination of:  Medicines to reduce certain symptoms.  Cognitive behavioral therapy (CBT) with a mental health professional who is experienced in treating PTSD. This type of therapy teaches a person how to recognize unhealthy feelings, thoughts, and behaviors, and how to replace those feelings with positive thoughts and actions.  Eye movement desensitization and reprocessing therapy (EMDR). This type of therapy involves moving the eyes from side to side or alternating tapping the left and right sides of the body (knees, feet, shoulders). These activities combined with positive imagery can help to lower sensitivity to the traumatic event.  If your loved one has other mental health problems, such as depression, alcohol abuse, or drug addiction, his or her treatment plan will include treatment for those conditions. How can I support my loved one? Talk about the condition  Tell your loved one that you want to hear how he or she is feeling, but give your loved one space if he or she does not feel like talking.  Keep conversations positive.  Listen well. Ask questions if you do not understand. Know that it is okay for your loved one to talk about the traumatic event, and that you do not have to "fix" things. Find support and resources  Ask your loved one's health care provider to recommend therapists or support groups that specialize in PTSD.  Attend family therapy. Working with a family counselor can help to reduce family tension and stress, and that can help with your loved one's PTSD symptoms.  If your loved one is a English as a second language teacher, look for  information from a local veterans organization. Consider calling the [facility] to get support and resources from a social worker: [phone]  Think about joining self-help and support groups, not only for your friend or family member, but  also for yourself. People in these peer and family support groups understand what you and your loved one are going through. They can help you feel a sense of comfort and connect you with local resources to help you learn more. General support  Make an effort to learn all you can about PTSD.  Help your loved one follow his or her treatment plan as directed by health care providers. This could mean driving him or her to therapy sessions or suggesting ways to cope with stress.  Ask your loved one how you can best help him or her.  Offer to go with your loved one to health care visits. You can help your loved one to stay on track with medicine and therapy.  Plan a walk or other exercises with your loved one. Doing these activities can help your loved one stay healthy and give him or her a chance to talk with you.  Encourage your loved one to build up a support system by staying in touch with close friends and family. Plan activities like having dinner or seeing a movie together.  Try not to take your loved one's feelings or actions personally. How can I create a safe environment? Personal safety  Encourage your loved one not to drink alcohol. Alcohol can make PTSD symptoms worse and can prevent treatment from working. If you need help with this, talk to your loved one's health care provider.  Make sure your loved one has a good relationship with his or her mental health provider. Mental health treatment can help to lower the risk of self-harm or suicide.  If your loved one's PTSD causes violent behavior, talk about ways to handle angry situations. Think about setting up a "time-out" system that allows anyone to stop a discussion and step away to calm down.  If your loved one is often angry or violent, talk with his or her health care provider about these symptoms. Your loved one's health care provider may recommend different therapy or ways to reduce this behavior. Home safety  Think about  putting a security system in your loved one's home. This may help your loved one feel safer and reduce his or her symptoms.  Support your loved one to create a safe and healthy home setting.  If your loved one becomes angry or violent, go to a safe place and call for help. If there are children in the home, get them to a safe place as well. How should I care for myself? Supporting someone with PTSD can cause stress. It is important to find ways to care for your body, mind, and well-being.  Eat a healthy diet, stay hydrated, and get regular exercise.  Be patient. The process of PTSD treatment can be slow. Remember that you cannot change your loved one yourself. It is up to him or her.  Stay positive. Helping a loved one with PTSD can be stressful, but try to remember fun times and celebrate small victories.  Make time for activities and hobbies that you enjoy, as well as time for yourself. This will help you to reduce stress and gather your thoughts.  Ask for support from family members, friends, coworkers, neighbors, support groups, or a health care provider.  What are some signs  that the condition is getting worse? If your loved one's symptoms are more intense or more frequent, the PTSD may be getting worse. If treatment does not seem to be helping symptoms, talk to your loved one's health care provider about a new treatment approach or added PTSD resources. Get help right away if:  Your loved one expresses thoughts about harming himself or herself or others.  You are in a situation that threatens your life. Leave the situation and call emergency services (911 in the [place] as soon as possible.  Your loved one is: ? Acting suspicious and angry. ? Having repeated flashbacks.  You and your loved one are having an increasing number of fights.  Your loved one expresses that he or she is very depressed or anxious. If you ever feel like your loved one may hurt himself or herself or others,  or may have thoughts about taking his or her own life, get help right away. You can go to your nearest emergency department or call:  Your local emergency services (911 in the [place]  A suicide crisis helpline, such as the [facility] at 929-814-5345. This is open 24 hours a day.  Summary  PTSD is a mental health disorder that can develop after a traumatic event, such as a threat to life, a serious injury, sexual violence, or abuse of any type.  Treatment for PTSD may include medicines, counseling (cognitive behavioral therapy), eye movement desensitization and reprocessing therapy (EMDR), or a combination of these approaches.  Talk with your loved one about PTSD and its symptoms, and seek information from health care providers or support groups.  Make sure your loved one has a good relationship with his or her mental health provider. Mental health treatment can help to reduce your loved one's risk of self-harm or suicide.  It is important to take care of yourself so you can give your loved one the best care. Do not wait to take care of yourself if you feel threatened by your loved one. Get away and get help. This information is not intended to replace advice given to you by your health care provider. Make sure you discuss any questions you have with your health care provider. Document Released: 06/16/2016 Document Revised: 06/16/2016 Document Reviewed: 06/16/2016 Elsevier Interactive Patient Education  2018 [place] Disorder If you have been diagnosed with post-traumatic stress disorder (PTSD), you may be relieved that you now know why you have felt or behaved a certain way. Still, you may feel overwhelmed about the treatment ahead. You may also wonder how to get the support you need and how to deal with the condition day-to-day. If you are living with PTSD, there are ways to help you recover from it and manage your  symptoms. How to manage lifestyle changes Managing stress Stress is your body's reaction to life changes and events, both good and bad. Stress can make PTSD worse. Take the following steps to cope with stress:  Talk with your health care provider or a counselor if you would like to learn more about techniques to reduce your stress. He or she may suggest some stress reduction techniques such as: ? Muscle relaxation exercises. ? Regular exercise. ? Meditation, yoga, or other mind-body exercises. ? Breathing exercises. ? Listening to quiet music. ? Spending time outside.  Maintain a healthy lifestyle. Eat a healthy diet, exercise regularly, get plenty of sleep, and take time to relax.  Spend time with others. Talk  with them about how you are feeling and what kind of support you need. Try to not isolate yourself, even though you may feel like doing that. Isolating yourself can delay your recovery.  Do activities and hobbies that you enjoy.  Pace yourself when doing stressful things. Take breaks, and reward yourself when you finish. Make sure that you do not overload your schedule.  Medicines Your health care provider may suggest certain medicines if he or she feels that they will help to improve your condition. Antidepressants or antipsychotic medicines may be used to treat PTSD. Avoid using alcohol and other substances that may prevent your medicines from working properly (may interact). It is also important to:  Talk with your pharmacist or health care provider about all medicines that you take, their possible side effects, and which medicines are safe to take together.  Make it your goal to take part in all treatment decisions (shared decision-making). Ask about possible side effects of medicines that your health care provider recommends, and tell him or her how you feel about having those side effects. It is best if shared decision-making with your health care provider is part of your total  treatment plan.  If your health care provider prescribes a medicine, you may not notice the full benefits of it for 4-8 weeks. Most people who are treated for PTSD need to take medicine for at least 6-12 months after they feel better. If you are taking medicines as part of your treatment, do not stop taking medicines before you ask your health care provider if it is safe to stop. You may need to have the medicine slowly decreased (tapered) over time to lower the risk of harmful side effects. Relationships Many people who have PTSD have difficulty trusting others. Make an effort to:  Take risks and develop trust with close friends and family members. Developing trust in others can help you feel safe and connect you with emotional support.  Be open and honest about your feelings.  Try to have fun and relax in safe spaces, such as with friends and family.  Think about going to couples counseling, family education classes, or family therapy. Your loved ones may not always know how to be supportive. Therapy can be helpful for everyone.  How to recognize changes in your condition Be aware of your symptoms and how often you have them. The following symptoms mean that you need to seek help for your PTSD:  You feel suspicious and angry.  You have repeated flashbacks.  You avoid going out or being with others.  You have an increasing number of fights with close friends or family members, such as your spouse.  You have thoughts about hurting yourself or others.  You cannot get relief from feelings of depression or anxiety.  Where to find support Talking to others  Explain that PTSD is a mental health problem. It is something that a person can develop after experiencing or seeing a life-threatening event. Tell them that PTSD makes you feel stress like you did during the event.  Talk to your loved ones about the symptoms you have. Also tell them what things or situations can cause symptoms to  start (are triggers for you).  Assure your loved ones that there are treatments to help PTSD. Discuss possibly seeking family therapy or couples therapy.  If you are worried or fearful about seeking treatment, ask for support. Finances Not all insurance plans cover mental health care, so it is important to  check with your insurance carrier. If paying for co-pays or counseling services is a problem, search for a local or county mental health care center. Public mental health care services may be offered there at a low cost or no cost when you are not able to see a private health care provider. If you are a veteran, contact a local veterans organization or veterans hospital for more information. If you are taking medicine for PTSD, you may be able to get the genericform, which may be less expensive than brand-name medicine. Some makers of prescription medicines also offer help to patients who cannot afford the medicines that they need. Community resources  Find a support group in your community. Often, groups are available for [employer] veterans, trauma victims, and family members or caregivers.  Look into volunteer opportunities. Taking part in these can help you feel more connected to your community.  Contact a local organization to find out if you are eligible for a service dog.  Keep daily contact with at least one trusted friend or family member. Follow these instructions at home: Lifestyle  Exercise regularly. Try to do 30 or more minutes of physical activity on most days of the week.  Try to get 7-9 hours of sleep each night. To help with sleep: ? Keep your bedroom cool and dark. ? Do not eat a heavy meal during the hour before you go to bed. ? Do not drink alcohol or caffeinated drinks before bed. ? Avoid screen time before bedtime. This means avoiding use of your TV, computer, tablet, and cell phone.  Avoid using alcohol or drugs.  Practice self-soothing skills and use them  daily.  Try to have fun and seek humor in your life. General instructions  If your PTSD is affecting your marriage or family, seek help from a family therapist.  Take over-the-counter and prescription medicines only as told by your health care provider.  Make sure to let all of your health care providers know that you have PTSD. This is especially important if you are having surgery or need to be admitted to the hospital.  Keep all follow-up visits as told by your health care providers. This is important. Where to find more information: Go to this website to find more information about PTSD, treatment for PTSD, and how to get support:  [facility] for PTSD: www.ptsd.[email or web address].cz  Contact a health care provider if:  Your symptoms get worse or they do not get better. Get help right away if:  You have thoughts about hurting yourself or others. If you ever feel like you may hurt yourself or others, or have thoughts about taking your own life, get help right away. You can go to your nearest emergency department or call:  Your local emergency services (911 in the [place]  A suicide crisis helpline, such as the [facility] at 9516873904. This is open 24-hours a day.  Summary  If you are living with PTSD, there are ways to help you recover from it and manage your symptoms.  Find supportive environments and people who understand PTSD. Spend time in those places, and maintain contact with those people.  Work with your health care team to create a management plan that includes counseling, stress management techniques, and healthy lifestyle habits. This information is not intended to replace advice given to you by your health care provider. Make sure you discuss any questions you have with your health care provider. Document Released: 06/04/2016 Document Revised: 06/04/2016 Document Reviewed:  06/04/2016 Elsevier Interactive Patient Education  [employer].

## 2017-08-05 NOTE — Progress Notes (Signed)
Assessment and Plan:  1. Type 2 diabetes mellitus with complication, unspecified whether long term insulin use ([place])   2. Mixed diabetic hyperlipidemia associated with type 2 diabetes mellitus ([place])   3. Hypertension associated with diabetes ([place])   4. Acute renal failure superimposed on stage 3 chronic kidney disease, unspecified acute renal failure type ([place])   5. Mood disorder ([place])- mixed PTSD and depression   6. Insomnia, unspecified type   7. Other specified hypothyroidism   8. Congestive heart failure, unspecified HF chronicity, unspecified heart failure type ([place])   9. Chronic post-traumatic stress disorder (PTSD)   10. Mood disorder ([place])     - Hypertension: - stable  Treatment regimen- see med orders from today for any changes  Blood pressure today at 108/70.  Importance of ambulatory blood pressure monitoring d/c pt- bring in log next OV DASH diet.  Routine exercise- 150 min /wk   - Cholesterol:  -Last fasting lipid profile LDL at goal at 67, this was done 04/30/2017 Counseling diet and exercise.  Check cholesterol +/- CMET q 40mo   - Diabetes:    -Last A1c 5.7  3 months ago.  Will check every 6 months if remains well controlled as it is - cont meds Counseling diet and exercise.  Eye & foot exams and urine microalbumin:crt ratio yearly -Urine collected today to obtain microalbumin to creatinine ratio.  - Vitamin D:   monitor level and medications/ supplements as indicated.    - Weight Mgt: Explained to patient what BMI refers to, and what it means medically.   Told patient to think about it as a "medical risk stratification measurement" and how increasing BMI is associated with increasing risk/ or worsening state of various diseases such as hypertension, hyperlipidemia, diabetes, premature OA, depression etc.  American Heart Association guidelines for healthy diet, basically Mediterranean diet, and exercise guidelines of 30 minutes 5 days per week or  more discussed in detail.  -Reminded patient the need for yearly complete physical exam office visits in addition to office visits for management of the chronic diseases  - Mood disorder:  -Discussed with the patient and his wife today regarding his PHQ score today, 08/05/2017 at 10 and I recommended that the patient follow up for an appointment with his psychiatrist at the [facility] to aid with his mood medications.  [facility] understands [facility] many [facility] counsel[facility] as well as group support programs for patients with PTSD.  -Wife concerned if he does not have his clonazepam and he might have withdrawal seizures.  This has happened in the past.  Since they're completely out-  only a 30-day supply it was given -Wife and patient both understand he will get clonazepam and his Effexor by psychiatry in the future. Patient denies wanting to be referred to a psychiatrist today, however, his wife notes that he will follow up with a [place] psychiatrist.  They will call [facility] for an appointment. -Advised pt not to drive  -I advised the patient and his wife today that the patient shouldn't be driving while on this medication.  -Gross side effects, risk and benefits, and alternatives of medications discussed with patient.  Patient is aware that all medications have potential side effects and we are unable to predict every side effect or drug-drug interaction that may occur.  Expresses verbal understanding and consents to current therapy plan and treatment regimen.   -Also spent 63min or more going over entire medication list with wife and patient,  one by one to determine what he gets refilled from [place] and what he will get from his specialists.   -He will continue to get controlled substances from his psychiatrist as well as from his pain doctors.  Most everything else we will prescribe.  Pt was in the office today for 32.5+ minutes, with over 50% time spent in face to face counseling of patients various medical  conditions, treatment plans of those medical conditions including medicine management and lifestyle modification, strategies to improve health and well being; and in coordination of care. SEE ABOVE TREATMENT PLAN FOR DETAILS  Follow Up:  Return for f/up  3 months for reck A1c/ DM and other chronic conditions.   Please see AVS handed out to patient at the end of our visit for further patient instructions/ counseling done pertaining to today's office visit.    Note: This document was prepared using Dragon voice recognition software and may include unintentional dictation errors.  This document serves as a record of services personally performed by Mellody Dance, DO. It was created on her behalf by Steva Colder, a trained medical scribe. The creation of this record is based on the scribe's personal observations and the provider's statements to them.   I have reviewed the above medical documentation for accuracy and completeness and I concur.  Mellody Dance 08/05/17 1:35 PM    ----------------------------------------------------------------------------------------------------------------------  Subjective:  HPI: [name] [name] 72 y.o. male  presents for 3 month follow up for multiple medical problems.   He notes back pain that he has had an extensive workup for. He is on lasix that he takes due to CHF-like symptoms that include abdominal bloating. He was diagnosed with PTSD following being in the [employer] and had to follow up with the [place] [facility] for a psych eval to receive benefits. Patient's wife notes that the patient is driving while on klonopin BID.    HPI:  [name] [name] y.o. male presents for 3 month follow up for multiple medical problems.   1) Diabetes   Pt reports compliance with medications and/ or treatment plan. He is exercising as much as he can and notes that he can't stand for longer than 5 minutes.  Denies S-E  Home fasting glucose readings range -     110-150  Denies polyuria/polydipsia. Denies hypoglycemia symptoms  Last diabetic eye exam was No results found for: HMDIABEYEEXA  Foot exam-  UTD  Lab Results  Component Value Date   HGBA1C 5.7 (H) 04/30/2017   HGBA1C 6.1 12/15/2016   HGBA1C 6.0 03/30/2016    Lab Results  Component Value Date   LDLCALC 67 04/30/2017   CREATININE 1.26 04/30/2017    2) Hyperlipidemia  Pt reports compliance with medications and/ or treatment plan Denies S-E  RUQ pain-  n/a    Muscle aches- n/a    Patient reports poor compliance with low fat/low cholesterol diet and healthier lifestyle  modifications.   Last lipid panel as follows:  Lab Results  Component Value Date   CHOL 133 04/30/2017   HDL 33 (L) 04/30/2017   LDLCALC 67 04/30/2017   TRIG 163 (H) 04/30/2017   CHOLHDL 4.0 04/30/2017     3) Hypertension  Pt reports compliance with medications and/ or treatment plan Denies S-E. Home Blood pressure range-  n/a  Low salt diet-   n/a  Exercise-    He doesn't exercise as much due to a prior back injury.  Smoking Status noted  Patient denies new onset of  sx- no chest pain, dizziness, HA, DIB/ shortness of breath  or swelling. Lab Results  Component Value Date   CREATININE 1.26 04/30/2017    Last 3 blood pressure readings in our office are as follows: BP Readings from Last 3 Encounters:  08/05/17 108/70  07/06/17 (!) 142/72  06/09/17 124/78    The CVD Risk score (D'Agostino, et al., 2008) failed to calculate for the following reasons:   CVD risk score not calculated   4) Weight:  Wt Readings from Last 3 Encounters:  08/05/17 242 lb 11.2 oz (110.1 kg)  07/06/17 237 lb 4.8 oz (107.6 kg)  06/09/17 237 lb (107.5 kg)   BMI Readings from Last 3 Encounters:  08/05/17 32.92 kg/m  07/06/17 34.05 kg/m  06/09/17 34.01 kg/m      Patient Care Team    Relationship Specialty Notifications Start End  Mellody Dance, DO PCP - General Family Medicine  03/30/17   Minus Breeding, MD PCP - Cardiology Cardiology Admissions 05/26/17   Minus Breeding, MD Consulting Physician Cardiology  04/01/17   Dorna Leitz, MD Consulting Physician Orthopedic Surgery  04/01/17   Idamae Lusher, MD  Ophthalmology  04/01/17   Clydell Hakim, MD Consulting Physician Anesthesiology  04/01/17    Comment: pain mgt-  chronic pain syn.   Allyn Kenner, MD  Dermatology  04/01/17    Comment: bowen's disease on chest- cryo txmnt  Ralene Ok, MD Consulting Physician General Surgery  04/01/17   Fanny Skates, MD Consulting Physician General Surgery  04/01/17   Georganna Skeans, MD Consulting Physician General Surgery  04/01/17   Wonda Horner, MD Consulting Physician Gastroenterology  04/01/17      Review of Systems: General:   Denies fever, chills, unexplained weight loss.  Optho/Auditory:   Denies visual changes, blurred vision/LOV Respiratory:   Denies SOB, DOE more than baseline levels.  Cardiovascular:   Denies chest pain, palpitations, new onset peripheral edema  Gastrointestinal:   Denies nausea, vomiting, diarrhea.  Genitourinary: Denies dysuria, freq/ urgency, flank pain or discharge from genitals.  Endocrine:     Denies hot or cold intolerance, polyuria, polydipsia. Musculoskeletal:   Denies unexplained myalgias, joint swelling, unexplained arthralgias, gait problems.  Skin:  Denies rash, suspicious lesions Neurological:     Denies dizziness, unexplained weakness, numbness  Psychiatric/Behavioral:   Denies mood changes, suicidal or homicidal ideations, hallucinations    Objective: Physical Exam: BP 108/70   Pulse 66   Ht 6' (1.829 m)   Wt 242 lb 11.2 oz (110.1 kg)   SpO2 92%   BMI 32.92 kg/m  Body mass index is 32.92 kg/m. General: Well nourished, in no apparent distress. Eyes: PERRLA, EOMs, conjunctiva clr no swelling or erythema ENT/Mouth: Hearing appears normal.  Mucus Membranes Moist  Neck: Supple, no masses, no carotid bruits b/l Resp: Respiratory effort-  normal, Breath sounds blunted to LLE posterior lobe, otherwise clear to auscultation, w/o W/R/R  Cardio: RRR w/o MRGs. Abdomen: no gross distention. Lymphatics:  Brisk peripheral pulses, less 2 sec cap RF, no gross edema  M-sk: Full ROM, 5/5 strength, normal gait.  Skin: Warm, dry without rashes, lesions, ecchymosis.  Neuro: Alert, Oriented Psych: Normal affect, Insight and Judgment questionable, agitation, angered by everything we discussed   Current Medications:  Current Outpatient Medications on File Prior to Visit  Medication Sig Dispense Refill  . allopurinol (ZYLOPRIM) 300 MG tablet Take 300 mg by mouth at bedtime.     Marland Kitchen aspirin EC 81 MG tablet Take 1 tablet (  81 mg total) by mouth daily. 90 tablet 3  . B Complex-C (B-COMPLEX WITH VITAMIN C) tablet Take 1 tablet by mouth daily with breakfast.     . calcium carbonate (TUMS - DOSED IN MG ELEMENTAL CALCIUM) 500 MG chewable tablet Chew 1-3 tablets by mouth 3 (three) times daily as needed for indigestion or heartburn.    . cephALEXin (KEFLEX) 500 MG capsule Take 1 capsule by mouth daily.  0  . cholecalciferol (VITAMIN D) 1000 units tablet Take 3,000 Units by mouth daily.    . Cobalamine Combinations (O-27 + FOLIC ACID) 0350-093 MCG TBDP Take 1 tablet by mouth daily.    Marland Kitchen docusate sodium (COLACE) 100 MG capsule Take 100 mg by mouth 4 (four) times daily as needed (for constipation).    . furosemide (LASIX) 20 MG tablet Take a 20 mg tablet daily or as directed 90 tablet 3  . gabapentin (NEURONTIN) 300 MG capsule Take 3 capsules (900 mg total) by mouth at bedtime. 270 capsule 1  . Guaifenesin (MUCINEX MAXIMUM STRENGTH) 1200 MG TB12 Take 1 tablet by mouth 2 (two) times daily.    Marland Kitchen HYDROcodone-acetaminophen (NORCO) 10-325 MG tablet Take 1 tablet by mouth every 6 (six) hours as needed (for pain).   0  . hydrocortisone cream 1 % Apply 1 application topically daily as needed for itching (for feet).    Marland Kitchen levothyroxine (SYNTHROID, LEVOTHROID) 75 MCG  tablet Take 75 mcg by mouth daily before breakfast.   3  . losartan (COZAAR) 50 MG tablet Take 1 tablet by mouth daily.  6  . Magnesium 500 MG TABS Take 500 mg by mouth daily with breakfast.    . Melatonin 5 MG CAPS Take 1 capsule by mouth daily.    . metFORMIN (GLUCOPHAGE-XR) 500 MG 24 hr tablet Take 1,000 mg by mouth at bedtime.  1  . metoprolol (LOPRESSOR) 100 MG tablet Take 100 mg by mouth 2 (two) times daily.  0  . nitroGLYCERIN (NITROSTAT) 0.4 MG SL tablet DISSOLVE 1 TABLET UNDER THE TONGUE EVERY 5 MINUTES FOR UP TO 3 DOSES AS NEEDED FOR CHEST PAIN. IF NO RELIEF AFTER 3 DOSES, CALL 911 OR GO TO 25 tablet 2  . omeprazole (PRILOSEC) 20 MG capsule Take 1 capsule (20 mg total) by mouth daily with breakfast. 90 capsule 1  . Polyethyl Glycol-Propyl Glycol (SYSTANE OP) Apply to eye.    . potassium gluconate 595 (99 K) MG TABS tablet Take 595 mg by mouth daily with breakfast.     . Probiotic Product (FLORAJEN3 PO) Take 1 capsule by mouth daily with breakfast.    . venlafaxine XR (EFFEXOR-XR) 75 MG 24 hr capsule Take 225 mg by mouth at bedtime.  3  . Vitamin D, Ergocalciferol, (DRISDOL) 50000 units CAPS capsule Take 1 capsule (50,000 Units total) by mouth every 7 (seven) days. 12 capsule 10   No current facility-administered medications on file prior to visit.     Medical History:  Patient Active Problem List   Diagnosis Date Noted  . Hypertension associated with diabetes ([place]) 04/01/2017    Priority: High  . Mixed diabetic hyperlipidemia associated with type 2 diabetes mellitus ([place]) 04/01/2017    Priority: High  . Type 2 diabetes mellitus with complication ([facility])     Priority: High  . Acute on chronic renal failure ([place]) 08/25/2014    Priority: High  . Anemia 04/07/2017    Priority: Medium  . CHF (congestive heart failure) ([place]) 04/01/2017    Priority: Medium  .  History of smoking 30 or more pack years 04/01/2017    Priority: Medium  . Mood disorder ([place])- mixed PTSD and depression  04/26/2015    Priority: Medium  . GERD (gastroesophageal reflux disease) 04/26/2015    Priority: Medium  . Insomnia 02/06/2011    Priority: Medium  . CAD (coronary artery disease)     Priority: Medium  . Hypothyroidism 04/26/2015    Priority: Low  . Hypokalemia 08/24/2014    Priority: Low  . Alcohol abuse 08/24/2014    Priority: Low  . Chronic gouty arthropathy- L 1st toe 06/09/2017  . Bunion of great toe of left foot 06/09/2017  . Hallux valgus (acquired), left foot 06/09/2017  . Toenail fungus 06/09/2017  . Fatigue 04/01/2017  . S/P hernia repair 07/27/2016  . Preop cardiovascular exam 05/30/2016  . S/P colostomy takedown 11/05/2015  . Abdominal pain   . Agitation   . Threatening to others   . Diverticulitis of large intestine with perforation without bleeding   . CAD in native artery   . Diverticulitis 04/26/2015  . Perforated diverticulum of large intestine 04/26/2015  . Diverticulitis of colon with perforation 04/26/2015  . Acute encephalopathy 08/25/2014  . Blood poisoning   . Elevated troponin   . CAP (community acquired pneumonia) 08/24/2014  . Sepsis ([place]) 08/24/2014  . AKI (acute kidney injury) ([place]) 08/24/2014  . DM (diabetes mellitus), type 2 ([place]) 02/28/2013  . Abnormal EKG 02/28/2013  . Fistula-in-ano 11/05/2011  . Hyperlipidemia 02/06/2011  . Hypertension     Allergies:  Allergies  Allergen Reactions  . Ciprofloxacin Hcl Other (See Comments)    Causes pain in tendons, feels like muscle spasms. BLACK BOX WARNING RE: POSSIBLE TENDON RUPTURE, MYALGIAS, and OTHERS  . Fentanyl Other (See Comments)    Pt can handle having Fentanyl under anesthesia - does not tolerate after surgery for post op pain - causes mood swings, confusion, and agitation    . Flomax [Tamsulosin Hcl] Other (See Comments)    DROPS BLOOD PRESSURE  . Ace Inhibitors Cough     Family history-  Reviewed; changed as appropriate  Social history-  Reviewed; changed as appropriate

## 2017-08-09 ENCOUNTER — Other Ambulatory Visit: Payer: Self-pay

## 2017-08-09 MED ORDER — METOPROLOL TARTRATE 100 MG PO TABS: 100.0000 mg | ORAL_TABLET | Freq: Two times a day (BID) | ORAL | 0 refills | Status: DC

## 2017-08-09 MED ORDER — ALLOPURINOL 300 MG PO TABS: 300.0000 mg | ORAL_TABLET | Freq: Every day | ORAL | 0 refills | Status: DC

## 2017-08-09 NOTE — Telephone Encounter (Signed)
Pharmacy sent refill request for Allopurinol and Metoprolol.  Medications were last filled by pervious provider.  Last office visit was 08/05/2017.  Sent request to Dr. Raliegh Scarlet to review and advise. MPulliam, CMA/RT(R)

## 2017-08-12 ENCOUNTER — Telehealth: Payer: Self-pay | Admitting: Family Medicine

## 2017-08-12 NOTE — Telephone Encounter (Signed)
Patient request provider call him when available @ [phone].  Pt would not share message ,says needs doctor to call him.  --Forwarding message

## 2017-08-13 NOTE — Telephone Encounter (Signed)
Called and spoke to the patient's wife and she states that he just wanted to apologize for his behavior at his last office visit. MPulliam, CMA/RT(R)

## 2017-08-16 NOTE — Telephone Encounter (Signed)
Please let them know its no problem at all.  Please reassure them not to worry about it one bit, we all have bad days and good.  All I wantand care about is what is best for the well being of the pt.

## 2017-08-18 NOTE — Telephone Encounter (Signed)
Pt's spouse informed.  T. Jenese Mischke, CMA 

## 2017-08-28 ENCOUNTER — Encounter: Payer: Self-pay | Admitting: Cardiology

## 2017-08-28 NOTE — Progress Notes (Signed)
Cardiology Office Note   Date:  08/31/2017   ID:  [name] [name], DOB 1945-08-07, MRN [id number]  PCP:  Mellody Dance, DO  Cardiologist:   Minus Breeding, MD  Referring:  Dr. Ralene Ok  Chief Complaint  Patient presents with  . Coronary Artery Disease      History of Present Illness: [name] [name] is a 72 y.o. male who presents for *CAD, status post CABG in 2001.   LHC (04/2007) demonstrated LAD occluded, then 50%, circumflex occluded, 90% into a small distal continuation branch, RCA occluded, SVG-RCA patent, LIMA-LAD patent, SVG-OM1/distal circumflex patent. EF 55-60%.   Myoview (01/2011) demonstrated no ischemia, EF 68%; normal study.  Dobutamine echocardiogram (02/2012) demonstrated a positive ECG changes but no stress-induced wall motion abnormalities on echocardiogram (normal study).  He has not tolerated med titration because of low BPs.    Since I saw him he is done relatively well.  He gets around slowly with a cane or a rolling walker.  He has not had any acute cardiovascular complaints.  He has some congestion and has to take Mucinex but if he does this he can get in bed and not cough and not have shortness of breath, PND or orthopnea.  He does not really notice any palpitations, presyncope or syncope.  He does describe bendopathy.    Past Medical History:  Diagnosis Date  . Anal fistula   . Anemia   . Anxiety    panic attacks  . Arthritis   . CAD (coronary artery disease)    a. s/p CABG 2001; b. LHC (3/09):  S-RCA, L-LAD, S-OM1/dCFX all patent, EF 55-60%;  c. myoview (12/12):  no ischemia, EF 68%;  d. Dob Echo (1/14):  + ECG changes but normal echo => med Rx cont'd  . [facility])    pt denies  . CHF (congestive heart failure) (HCC)    EF 55%  . Complication of anesthesia    CONFUSION, psychosis after surgery for a week  . DDD (degenerative disc disease), lumbar    cervical thoracic and lumbar  . Diabetes mellitus without complication ([place])   .  Diverticulitis   . GERD (gastroesophageal reflux disease)   . Gout   . History of pneumonia    septic pneumonia  . Hyperlipidemia   . Hypertension   . Hypothyroidism   . IBS (irritable bowel syndrome)   . Osteoporosis   . PTSD (post-traumatic stress disorder)   . Scoliosis   . Spinal stenosis     Past Surgical History:  Procedure Laterality Date  . APPENDECTOMY    . BLEPHAROPLASTY Bilateral   . CERVICAL FUSION     times 2  . CHOLECYSTECTOMY    . COLOSTOMY REVERSAL  11/05/2015  . COLOSTOMY TAKEDOWN N/A 11/05/2015   Procedure: COLOSTOMY TAKEDOWN;  Surgeon: Georganna Skeans, MD;  Location: [place];  Service: General;  Laterality: N/A;  . CORONARY ARTERY BYPASS GRAFT     2001 LIMA LAD, SVG OM/Circ dista, SVG PDA.  Cath 2009 with patent grafts  . HAND SURGERY Right   . HIP ARTHROPLASTY Right 03/01/2013   Procedure: ARTHROPLASTY BIPOLAR HIP;  Surgeon: Alta Corning, MD;  Location: [facility];  Service: Orthopedics;  Laterality: Right;  . INCISIONAL HERNIA REPAIR N/A 11/05/2015   Procedure: REPAIR INCISIONAL HERNIA;  Surgeon: Georganna Skeans, MD;  Location: [place];  Service: General;  Laterality: N/A;  . INCISIONAL HERNIA REPAIR N/A 07/27/2016   Procedure: HERNIA REPAIR INCISIONAL;  Surgeon: Rosendo Gros,  Anne Hahn, MD;  Location: [place];  Service: General;  Laterality: N/A;  . INSERTION OF MESH N/A 07/27/2016   Procedure: INSERTION OF MESH;  Surgeon: Ralene Ok, MD;  Location: [place];  Service: General;  Laterality: N/A;  . JOINT REPLACEMENT     Right knee  . KNEE ARTHROSCOPY Bilateral   . LAPAROTOMY N/A 07/27/2016   Procedure: EXPLORATORY LAPAROTOMY;  Surgeon: Ralene Ok, MD;  Location: [place];  Service: General;  Laterality: N/A;  . LYSIS OF ADHESION N/A 07/27/2016   Procedure: LYSIS OF ADHESION;  Surgeon: Ralene Ok, MD;  Location: [place];  Service: General;  Laterality: N/A;  . PARTIAL COLECTOMY N/A 04/26/2015   Procedure: EXPLORATORY LAPAROTOMY PARTIAL COLECTOMY WITH COLOSTOMY AND  HARTMANN'S;  Surgeon: Georganna Skeans, MD;  Location: [place];  Service: General;  Laterality: N/A;  . PLACEMENT OF SETON     seton stitch  . STERNAL WIRE REMOVAL    . TREATMENT FISTULA ANAL     times 3     Current Outpatient Medications  Medication Sig Dispense Refill  . allopurinol (ZYLOPRIM) 300 MG tablet Take 1 tablet (300 mg total) by mouth at bedtime. 90 tablet 0  . aspirin EC 81 MG tablet Take 1 tablet (81 mg total) by mouth daily. 90 tablet 3  . B Complex-C (B-COMPLEX WITH VITAMIN C) tablet Take 1 tablet by mouth daily with breakfast.     . calcium carbonate (TUMS - DOSED IN MG ELEMENTAL CALCIUM) 500 MG chewable tablet Chew 1-3 tablets by mouth 3 (three) times daily as needed for indigestion or heartburn.    . cholecalciferol (VITAMIN D) 1000 units tablet Take 3,000 Units by mouth daily.    . clonazePAM (KLONOPIN) 1 MG tablet Take 1 tablet (1 mg total) by mouth 2 (two) times daily as needed for anxiety. 60 tablet 0  . Cobalamine Combinations (K-02 + FOLIC ACID) 5427-062 MCG TBDP Take 1 tablet by mouth daily.    Marland Kitchen docusate sodium (COLACE) 100 MG capsule Take 100 mg by mouth 4 (four) times daily as needed (for constipation).    . furosemide (LASIX) 20 MG tablet Take a 20 mg tablet daily or as directed 90 tablet 3  . gabapentin (NEURONTIN) 300 MG capsule Take 3 capsules (900 mg total) by mouth at bedtime. 270 capsule 1  . Guaifenesin (MUCINEX MAXIMUM STRENGTH) 1200 MG TB12 Take 1 tablet by mouth 2 (two) times daily.    Marland Kitchen HYDROcodone-acetaminophen (NORCO) 10-325 MG tablet Take 1 tablet by mouth every 6 (six) hours as needed (for pain).   0  . hydrocortisone cream 1 % Apply 1 application topically daily as needed for itching (for feet).    Marland Kitchen levothyroxine (SYNTHROID, LEVOTHROID) 75 MCG tablet Take 75 mcg by mouth daily before breakfast.   3  . losartan (COZAAR) 50 MG tablet Take 1 tablet (50 mg total) by mouth daily. 90 tablet 3  . Magnesium 500 MG TABS Take 500 mg by mouth daily with  breakfast.    . Melatonin 5 MG CAPS Take 1 capsule by mouth daily.    . metFORMIN (GLUCOPHAGE-XR) 500 MG 24 hr tablet Take 1,000 mg by mouth at bedtime.  1  . metoprolol tartrate (LOPRESSOR) 100 MG tablet Take 1 tablet (100 mg total) by mouth 2 (two) times daily. 180 tablet 3  . nitroGLYCERIN (NITROSTAT) 0.4 MG SL tablet DISSOLVE 1 TABLET UNDER THE TONGUE EVERY 5 MINUTES FOR UP TO 3 DOSES AS NEEDED FOR CHEST PAIN. IF NO RELIEF AFTER  3 DOSES, CALL 911 OR GO TO 25 tablet 2  . omeprazole (PRILOSEC) 20 MG capsule Take 1 capsule (20 mg total) by mouth daily with breakfast. 90 capsule 1  . Polyethyl Glycol-Propyl Glycol (SYSTANE OP) Apply to eye.    . potassium gluconate 595 (99 K) MG TABS tablet Take 595 mg by mouth daily with breakfast.     . Probiotic Product (FLORAJEN3 PO) Take 1 capsule by mouth daily with breakfast.    . venlafaxine XR (EFFEXOR-XR) 75 MG 24 hr capsule Take 225 mg by mouth at bedtime.  3  . Vitamin D, Ergocalciferol, (DRISDOL) 50000 units CAPS capsule Take 1 capsule (50,000 Units total) by mouth every 7 (seven) days. 12 capsule 10   No current facility-administered medications for this visit.     Allergies:   Ciprofloxacin hcl; Fentanyl; Flomax [tamsulosin hcl]; and Ace inhibitors     ROS:  Please see the history of present illness.   Otherwise, review of systems are positive for none.  All other systems are reviewed and negative.    PHYSICAL EXAM: VS:  BP 134/62   Pulse 64   Ht 5\' 10"  (1.778 m)   Wt 240 lb 3.2 oz (109 kg)   SpO2 95%   BMI 34.47 kg/m  , BMI Body mass index is 34.47 kg/m.  GENERAL:  Well appearing NECK:  No jugular venous distention, waveform within normal limits, carotid upstroke brisk and symmetric, no bruits, no thyromegaly LUNGS:  Clear to auscultation bilaterally CHEST:  Well healed sternotomy scar. HEART:  PMI not displaced or sustained,S1 and S2 within normal limits, no S3, no S4, no clicks, no rubs, no murmurs ABD:  Flat, positive bowel  sounds normal in frequency in pitch, no bruits, no rebound, no guarding, no midline pulsatile mass, no hepatomegaly, no splenomegaly EXT:  2 plus pulses throughout, no edema, no cyanosis no clubbing    EKG:  EKG is not ordered today.  Recent Labs: 04/30/2017: ALT 15; BUN 13; Creatinine, Ser 1.26; Potassium 4.6; Sodium 140; TSH 4.890 07/06/2017: Hemoglobin 12.8; Platelet Count 264    Lipid Panel    Component Value Date/Time   CHOL 133 04/30/2017 0854   TRIG 163 (H) 04/30/2017 0854   HDL 33 (L) 04/30/2017 0854   CHOLHDL 4.0 04/30/2017 0854   CHOLHDL 4.2 04/28/2015 0529   VLDL 35 04/28/2015 0529   LDLCALC 67 04/30/2017 0854     Lab Results  Component Value Date   HGBA1C 5.7 (H) 04/30/2017     Wt Readings from Last 3 Encounters:  08/31/17 240 lb 3.2 oz (109 kg)  08/05/17 242 lb 11.2 oz (110.1 kg)  07/06/17 237 lb 4.8 oz (107.6 kg)      Other studies Reviewed: Additional studies/ records that were reviewed today include:  Labs Review of the above records demonstrates:  None   ASSESSMENT AND PLAN:   CAD:  The patient has no new sypmtoms.  No further cardiovascular testing is indicated.  We will continue with aggressive risk reduction and meds as listed.  DM:  I did review his last A1c which was as above.  No change in therapy.   HTN:   The blood pressure is at target.  Continue current therapy.   DYSLIPIDEMIA:    His LDL was 67.  He will remain on meds as listed.   FATIGUE:  This is improved as he sleeps better and his Hgb is improved.   Current medicines are reviewed at length with the patient today.  The patient does not have concerns regarding medicines.  The following changes have been made:   None  Labs/ tests ordered today include:  None  No orders of the defined types were placed in this encounter.    Disposition:   FU with me in one year.    Signed, Minus Breeding, MD  08/31/2017 2:56 PM    [facility]

## 2017-08-31 ENCOUNTER — Encounter: Payer: Self-pay | Admitting: Cardiology

## 2017-08-31 ENCOUNTER — Ambulatory Visit: Payer: Medicare Other | Admitting: Cardiology

## 2017-08-31 VITALS — BP 134/62 | HR 64 | Ht 70.0 in | Wt 240.2 lb

## 2017-08-31 DIAGNOSIS — E785 Hyperlipidemia, unspecified: Secondary | ICD-10-CM

## 2017-08-31 DIAGNOSIS — R5383 Other fatigue: Secondary | ICD-10-CM

## 2017-08-31 DIAGNOSIS — I10 Essential (primary) hypertension: Secondary | ICD-10-CM

## 2017-08-31 DIAGNOSIS — I25119 Atherosclerotic heart disease of native coronary artery with unspecified angina pectoris: Secondary | ICD-10-CM | POA: Diagnosis not present

## 2017-08-31 MED ORDER — LOSARTAN POTASSIUM 50 MG PO TABS: 50.0000 mg | ORAL_TABLET | Freq: Every day | ORAL | 3 refills | Status: DC

## 2017-08-31 MED ORDER — FUROSEMIDE 20 MG PO TABS: ORAL_TABLET | ORAL | 3 refills | Status: DC

## 2017-08-31 MED ORDER — METOPROLOL TARTRATE 100 MG PO TABS: 100.0000 mg | ORAL_TABLET | Freq: Two times a day (BID) | ORAL | 3 refills | Status: DC

## 2017-08-31 MED ORDER — NITROGLYCERIN 0.4 MG SL SUBL: SUBLINGUAL_TABLET | SUBLINGUAL | 2 refills | Status: DC

## 2017-08-31 NOTE — Patient Instructions (Signed)
Medication Instructions:  Continue current medications  If you need a refill on your cardiac medications before your next appointment, please call your pharmacy.  Labwork: None Ordered H  Testing/Procedures: None Ordered   Follow-Up: Your physician wants you to follow-up in: 1 Year. You should receive a reminder letter in the mail two months in advance. If you do not receive a letter, please call our office [phone].      Thank you for choosing [facility] at [facility]!!

## 2017-09-07 ENCOUNTER — Telehealth: Payer: Self-pay | Admitting: Family Medicine

## 2017-09-07 ENCOUNTER — Encounter: Payer: Self-pay | Admitting: Family Medicine

## 2017-09-07 ENCOUNTER — Ambulatory Visit (INDEPENDENT_AMBULATORY_CARE_PROVIDER_SITE_OTHER): Payer: Medicare Other | Admitting: Family Medicine

## 2017-09-07 VITALS — BP 150/88 | HR 63 | Ht 70.0 in | Wt 238.0 lb

## 2017-09-07 DIAGNOSIS — Z23 Encounter for immunization: Secondary | ICD-10-CM | POA: Diagnosis not present

## 2017-09-07 DIAGNOSIS — E118 Type 2 diabetes mellitus with unspecified complications: Secondary | ICD-10-CM

## 2017-09-07 DIAGNOSIS — Z0000 Encounter for general adult medical examination without abnormal findings: Secondary | ICD-10-CM

## 2017-09-07 DIAGNOSIS — Z5320 Procedure and treatment not carried out because of patient's decision for unspecified reasons: Secondary | ICD-10-CM

## 2017-09-07 DIAGNOSIS — Z1159 Encounter for screening for other viral diseases: Secondary | ICD-10-CM | POA: Diagnosis not present

## 2017-09-07 DIAGNOSIS — Z136 Encounter for screening for cardiovascular disorders: Secondary | ICD-10-CM | POA: Diagnosis not present

## 2017-09-07 MED ORDER — ZOSTER VAC RECOMB ADJUVANTED 50 MCG/0.5ML IM SUSR: 0.5000 mL | Freq: Once | INTRAMUSCULAR | 0 refills | Status: AC

## 2017-09-07 MED ORDER — TETANUS-DIPHTH-ACELL PERTUSSIS 5-2.5-18.5 LF-MCG/0.5 IM SUSP: 0.5000 mL | Freq: Once | INTRAMUSCULAR | 0 refills | Status: AC

## 2017-09-07 MED ORDER — TETANUS-DIPHTH-ACELL PERTUSSIS 5-2-15.5 LF-MCG/0.5 IM SUSP: 0.5000 mL | Freq: Once | INTRAMUSCULAR | 0 refills | Status: DC

## 2017-09-07 NOTE — Patient Instructions (Addendum)
Mr. [name] [name] , Thank you for taking time to come for your Medicare Wellness Visit. I appreciate your ongoing commitment to your health goals. Please review the following plan we discussed and let me know if I can assist you in the future.   These are the goals we discussed: Goals    We will update all your immunizations today. Also, please make sure you are using your four-point walker in the middle of the night to prevent any falls so that you will be safer.      This is a list of the screening recommended for you and due dates:  Health Maintenance  Topic Date Due  . Complete foot exam   09/15/2017*  . Tetanus Vaccine  04/01/2018*  .  Hepatitis C: One time screening is recommended by Center for Disease Control  (CDC) for  adults born from [birth year] through 1966.   04/01/2018*  . Colon Cancer Screening  06/10/2018*  . Flu Shot  09/16/2017  . Hemoglobin A1C  10/31/2017  . Eye exam for diabetics  01/04/2018  . Pneumonia vaccines  Completed  *Topic was postponed. The date shown is not the original due date.       Diabetes Mellitus and Standards of Medical Care  Managing diabetes (diabetes mellitus) can be complicated. Your diabetes treatment may be managed by a team of health care providers, including:  A diet and nutrition specialist (registered dietitian).  A nurse.  A certified diabetes educator (CDE).  A diabetes specialist (endocrinologist).  An eye doctor.  A primary care provider.  A dentist.  Your health care providers follow a schedule in order to help you get the best quality of care. The following schedule is a general guideline for your diabetes management plan. Your health care providers may also give you more specific instructions.  HbA1c (hemoglobin A1c) test This test provides information about blood sugar (glucose) control over the previous 2-3 months. It is used to check whether your diabetes management plan needs to be adjusted.  If you are meeting your  treatment goals, this test is done at least 2 times a year.  If you are not meeting treatment goals or if your treatment goals have changed, this test is done 4 times a year.  Blood pressure test  This test is done at every routine medical visit. For most people, the goal is less than 130/80. Ask your health care provider what your goal blood pressure should be.  Dental and eye exams  Visit your dentist two times a year.  If you have type 1 diabetes, get an eye exam 3-5 years after you are diagnosed, and then once a year after your first exam. ? If you were diagnosed with type 1 diabetes as a child, get an eye exam when you are age 80 or older and have had diabetes for 3-5 years. After the first exam, you should get an eye exam once a year.  If you have type 2 diabetes, have an eye exam as soon as you are diagnosed, and then once a year after your first exam.  Foot care exam  Visual foot exams are done at every routine medical visit. The exams check for cuts, bruises, redness, blisters, sores, or other problems with the feet.  A complete foot exam is done by your health care provider once a year. This exam includes an inspection of the structure and skin of your feet, and a check of the pulses and sensation in your feet. ?  Type 1 diabetes: Get your first exam 3-5 years after diagnosis. ? Type 2 diabetes: Get your first exam as soon as you are diagnosed.  Check your feet every day for cuts, bruises, redness, blisters, or sores. If you have any of these or other problems that are not healing, contact your health care provider.  Kidney function test (urine microalbumin)  This test is done once a year. ? Type 1 diabetes: Get your first test 5 years after diagnosis. ? Type 2 diabetes: Get your first test as soon as you are diagnosed._  If you have chronic kidney disease (CKD), get a serum creatinine and estimated glomerular filtration rate (eGFR) test once a year.  Lipid profile  (cholesterol, HDL, LDL, triglycerides)  This test should be done when you are diagnosed with diabetes, and every 5 years after the first test. If you are on medicines to lower your cholesterol, you may need to get this test done every year. ? The goal for LDL is less than 100 mg/dL (5.5 mmol/L). If you are at high risk, the goal is less than 70 mg/dL (3.9 mmol/L). ? The goal for HDL is 40 mg/dL (2.2 mmol/L) for men and 50 mg/dL(2.8 mmol/L) for women. An HDL cholesterol of 60 mg/dL (3.3 mmol/L) or higher gives some protection against heart disease. ? The goal for triglycerides is less than 150 mg/dL (8.3 mmol/L).  Immunizations  The yearly flu (influenza) vaccine is recommended for everyone 6 months or older who has diabetes.  The pneumonia (pneumococcal) vaccine is recommended for everyone 2 years or older who has diabetes. If you are 51 or older, you may get the pneumonia vaccine as a series of two separate shots.  The hepatitis B vaccine is recommended for adults shortly after they have been diagnosed with diabetes.  The Tdap (tetanus, diphtheria, and pertussis) vaccine should be given: ? According to normal childhood vaccination schedules, for children. ? Every 10 years, for adults who have diabetes.  The shingles vaccine is recommended for people who have had chicken pox and are 50 years or older.  Mental and emotional health  Screening for symptoms of eating disorders, anxiety, and depression is recommended at the time of diagnosis and afterward as needed. If your screening shows that you have symptoms (you have a positive screening result), you may need further evaluation and be referred to a mental health care provider.  Diabetes self-management education  Education about how to manage your diabetes is recommended at diagnosis and ongoing as needed.  Treatment plan  Your treatment plan will be reviewed at every medical visit.  Summary  Managing diabetes (diabetes mellitus)  can be complicated. Your diabetes treatment may be managed by a team of health care providers.  Your health care providers follow a schedule in order to help you get the best quality of care.  Standards of care including having regular physical exams, blood tests, blood pressure monitoring, immunizations, screening tests, and education about how to manage your diabetes.  Your health care providers may also give you more specific instructions based on your individual health.      Type 2 Diabetes Mellitus, Self Care, Adult Caring for yourself after you have been diagnosed with type 2 diabetes (type 2 diabetes mellitus) means keeping your blood sugar (glucose) under control with a balance of:  Nutrition.  Exercise.  Lifestyle changes.  Medicines or insulin, if necessary.  Support from your team of health care providers and others.  The following information explains what  you need to know to manage your diabetes at home. What do I need to do to manage my blood glucose?  Check your blood glucose every day, as often as told by your health care provider.  Contact your health care provider if your blood glucose is above your target for 2 tests in a row.  Have your A1c (hemoglobin A1c) level checked at least two times a year, or as often as told by your health care provider. Your health care provider will set individualized treatment goals for you. Generally, the goal of treatment is to maintain the following blood glucose levels:  Before meals (preprandial): 80-130 mg/dL (4.4-7.2 mmol/L).  After meals (postprandial): below 180 mg/dL (10 mmol/L).  A1c level: less than 7%.  What do I need to know about hyperglycemia and hypoglycemia? What is hyperglycemia? Hyperglycemia, also called high blood glucose, occurs when blood glucose is too high.Make sure you know the early signs of hyperglycemia, such as:  Increased thirst.  Hunger.  Feeling very tired.  Needing to urinate more  often than usual.  Blurry vision.  What is hypoglycemia? Hypoglycemia, also called low blood glucose, occurswith a blood glucose level at or below 70 mg/dL (3.9 mmol/L). The risk for hypoglycemia increases during or after exercise, during sleep, during illness, and when skipping meals or not eating for a long time (fasting). It is important to know the symptoms of hypoglycemia and treat it right away. Always have a 15-gram rapid-acting carbohydrate snack with you to treat low blood glucose. Family members and close friends should also know the symptoms and should understand how to treat hypoglycemia, in case you are not able to treat yourself. What are the symptoms of hypoglycemia? Hypoglycemia symptoms can include:  Hunger.  Anxiety.  Sweating and feeling clammy.  Confusion.  Dizziness or feeling light-headed.  Sleepiness.  Nausea.  Increased heart rate.  Headache.  Blurry vision.  Seizure.  Nightmares.  Tingling or numbness around the mouth, lips, or tongue.  A change in speech.  Decreased ability to concentrate.  A change in coordination.  Restless sleep.  Tremors or shakes.  Fainting.  Irritability.  How do I treat hypoglycemia?  If you are alert and able to swallow safely, follow the 15:15 rule:  Take 15 grams of a rapid-acting carbohydrate. Rapid-acting options include: ? 1 tube of glucose gel. ? 3 glucose pills. ? 6-8 pieces of hard candy. ? 4 oz (120 mL) of fruit juice. ? 4 oz (120 mL) of regular (not diet) soda.  Check your blood glucose 15 minutes after you take the carbohydrate.  If the repeat blood glucose level is still at or below 70 mg/dL (3.9 mmol/L), take 15 grams of a carbohydrate again.  If your blood glucose level does not increase above 70 mg/dL (3.9 mmol/L) after 3 tries, seek emergency medical care.  After your blood glucose level returns to normal, eat a meal or a snack within 1 hour.  How do I treat severe  hypoglycemia? Severe hypoglycemia is when your blood glucose level is at or below 54 mg/dL (3 mmol/L). Severe hypoglycemia is an emergency. Do not wait to see if the symptoms will go away. Get medical help right away. Call your local emergency services (911 in the [place] Do not drive yourself to the hospital. If you have severe hypoglycemia and you cannot eat or drink, you may need an injection of glucagon. A family member or close friend should learn how to check your blood glucose and how  to give you a glucagon injection. Ask your health care provider if you need to have an emergency glucagon injection kit available. Severe hypoglycemia may need to be treated in a hospital. The treatment may include getting glucose through an IV tube. You may also need treatment for the cause of your hypoglycemia. Can having diabetes put me at risk for other conditions? Having diabetes can put you at risk for other long-term (chronic) conditions, such as heart disease and kidney disease. Your health care provider may prescribe medicines to help prevent complications from diabetes. These medicines may include:  Aspirin.  Medicine to lower cholesterol.  Medicine to control blood pressure.  What else can I do to manage my diabetes? Take your diabetes medicines as told  If your health care provider prescribed insulin or diabetes medicines, take them every day.  Do not run out of insulin or other diabetes medicines that you take. Plan ahead so you always have these available.  If you use insulin, adjust your dosage based on how physically active you are and what foods you eat. Your health care provider will tell you how to adjust your dosage. Make healthy food choices  The things that you eat and drink affect your blood glucose and your insulin dosage. Making good choices helps to control your diabetes and prevent other health problems. A healthy meal plan includes eating lean proteins, complex carbohydrates,  fresh fruits and vegetables, low-fat dairy products, and healthy fats. Make an appointment to see a diet and nutrition specialist (registered dietitian) to help you create an eating plan that is right for you. Make sure that you:  Follow instructions from your health care provider about eating or drinking restrictions.  Drink enough fluid to keep your urine clear or pale yellow.  Eat healthy snacks between nutritious meals.  Track the carbohydrates that you eat. Do this by reading food labels and learning the standard serving sizes of foods.  Follow your sick day plan whenever you cannot eat or drink as usual. Make this plan in advance with your health care provider.  Stay active  Exercise regularly, as told by your health care provider. This may include:  Stretching and doing strength exercises, such as yoga or weightlifting, at least 2 times a week.  Doing at least 150 minutes of moderate-intensity or vigorous-intensity exercise each week. This could be brisk walking, biking, or water aerobics. ? Spread out your activity over at least 3 days of the week. ? Do not go more than 2 days in a row without doing some kind of physical activity.  When you start a new exercise or activity, work with your health care provider to adjust your insulin, medicines, or food intake as needed. Make healthy lifestyle choices  Do not use any tobacco products, such as cigarettes, chewing tobacco, and e-cigarettes. If you need help quitting, ask your health care provider.  If your health care provider says that alcohol is safe for you, limit alcohol intake to no more than 1 drink per day for nonpregnant women and 2 drinks per day for men. One drink equals 12 oz of beer, 5 oz of wine, or 1 oz of hard liquor.  Learn to manage stress. If you need help with this, ask your health care provider. Care for your body   Keep your immunizations up to date. In addition to getting vaccinations as told by your  health care provider, it is recommended that you get vaccinated against the following illnesses: ? The  flu (influenza). Get a flu shot every year. ? Pneumonia. ? Hepatitis B.  Schedule an eye exam soon after your diagnosis, and then one time every year after that.  Check your skin and feet every day for cuts, bruises, redness, blisters, or sores. Schedule a foot exam with your health care provider once every year.  Brush your teeth and gums two times a day, and floss at least one time a day. Visit your dentist at least once every 6 months.  Maintain a healthy weight. General instructions  Take over-the-counter and prescription medicines only as told by your health care provider.  Share your diabetes management plan with people in your workplace, school, and household.  Check your urine for ketones when you are ill and as told by your health care provider.  Ask your health care provider: ? Do I need to meet with a diabetes educator? ? Where can I find a support group for people with diabetes?  Carry a medical alert card or wear medical alert jewelry.  Keep all follow-up visits as told by your health care provider. This is important. Where to find more information: For more information about diabetes, visit:  American Diabetes Association (ADA): www.diabetes.org  American Association of Diabetes Educators (AADE): www.diabeteseducator.org/patient-resources  This information is not intended to replace advice given to you by your health care provider. Make sure you discuss any questions you have with your health care provider. Document Released: 05/27/2015 Document Revised: 07/11/2015 Document Reviewed: 03/08/2015 Elsevier Interactive Patient Education  2017 [place].      Blood Glucose Monitoring, Adult Monitoring your blood sugar (glucose) helps you manage your diabetes. It also helps you and your health care provider determine how well your diabetes management plan is  working. Blood glucose monitoring involves checking your blood glucose as often as directed, and keeping a record (log) of your results over time. Why should I monitor my blood glucose? Checking your blood glucose regularly can:  Help you understand how food, exercise, illnesses, and medicines affect your blood glucose.  Let you know what your blood glucose is at any time. You can quickly tell if you are having low blood glucose (hypoglycemia) or high blood glucose (hyperglycemia).  Help you and your health care provider adjust your medicines as needed.  When should I check my blood glucose? Follow instructions from your health care provider about how often to check your blood glucose.   This may depend on:  The type of diabetes you have.  How well-controlled your diabetes is.  Medicines you are taking.  If you have type 1 diabetes:  Check your blood glucose at least 2 times a day.  Also check your blood glucose: ? Before every insulin injection. ? Before and after exercise. ? Between meals. ? 2 hours after a meal. ? Occasionally between 2:00 a.m. and 3:00 a.m., as directed. ? Before potentially dangerous tasks, like driving or using heavy machinery. ? At bedtime.  You may need to check your blood glucose more often, up to 6-10 times a day: ? If you use an insulin pump. ? If you need multiple daily injections (MDI). ? If your diabetes is not well-controlled. ? If you are ill. ? If you have a history of severe hypoglycemia. ? If you have a history of not knowing when your blood glucose is getting low (hypoglycemia unawareness).  If you have type 2 diabetes:  If you take insulin or other diabetes medicines, check your blood glucose at least 2  times a day.  If you are on intensive insulin therapy, check your blood glucose at least 4 times a day. Occasionally, you may also need to check between 2:00 a.m. and 3:00 a.m., as directed.  Also check your blood glucose: ? Before  and after exercise. ? Before potentially dangerous tasks, like driving or using heavy machinery.  You may need to check your blood glucose more often if: ? Your medicine is being adjusted. ? Your diabetes is not well-controlled. ? You are ill.  What is a blood glucose log?  A blood glucose log is a record of your blood glucose readings. It helps you and your health care provider: ? Look for patterns in your blood glucose over time. ? Adjust your diabetes management plan as needed.  Every time you check your blood glucose, write down your result and notes about things that may be affecting your blood glucose, such as your diet and exercise for the day.  Most glucose meters store a record of glucose readings in the meter. Some meters allow you to download your records to a computer. How do I check my blood glucose? Follow these steps to get accurate readings of your blood glucose: Supplies needed   Blood glucose meter.  Test strips for your meter. Each meter has its own strips. You must use the strips that come with your meter.  A needle to prick your finger (lancet). Do not use lancets more than once.  A device that holds the lancet (lancing device).  A journal or log book to write down your results.  Procedure  Wash your hands with soap and water.  Prick the side of your finger (not the tip) with the lancet. Use a different finger each time.  Gently rub the finger until a small drop of blood appears.  Follow instructions that come with your meter for inserting the test strip, applying blood to the strip, and using your blood glucose meter.  Write down your result and any notes.  Alternative testing sites  Some meters allow you to use areas of your body other than your finger (alternative sites) to test your blood.  If you think you may have hypoglycemia, or if you have hypoglycemia unawareness, do not use alternative sites. Use your finger instead.  Alternative sites  may not be as accurate as the fingers, because blood flow is slower in these areas. This means that the result you get may be delayed, and it may be different from the result that you would get from your finger.  The most common alternative sites are: ? Forearm. ? Thigh. ? Palm of the hand.  Additional tips  Always keep your supplies with you.  If you have questions or need help, all blood glucose meters have a 24-hour "hotline" number that you can call. You may also contact your health care provider.  After you use a few boxes of test strips, adjust (calibrate) your blood glucose meter by following instructions that came with your meter.    The American Diabetes Association suggests the following targets for most nonpregnant adults with diabetes.  More or less stringent glycemic goals may be appropriate for each individual.  A1C: Less than 7% A1C may also be reported as eAG: Less than 154 mg/dl Before a meal (preprandial plasma glucose): 80-130 mg/dl 1-2 hours after beginning of the meal (Postprandial plasma glucose)*: Less than 180 mg/dl  *Postprandial glucose may be targeted if A1C goals are not met despite reaching preprandial glucose  goals.   GOALS in short:  The goals are for the Hgb A1C to be less than 7.0 & blood pressure to be less than 130/80.    It is recommended that all diabetics are educated on and follow a healthy diabetic diet, exercise for 30 minutes 3-4 times per week (walking, biking, swimming, or machine), monitor blood glucose readings and bring that record with you to be reviewed at your next office visit.     You should be checking fasting blood sugars- especially after you eat poorly or eat really healthy, and also check 2 hour postprandial blood sugars after largest meal of the day.    Write these down and bring in your log at each office visit.    You will need to be seen every 3 months by the provider managing your Diabetes unless told otherwise by that  provider.   You will need yearly eye exams from an eye specialist and foot exams to check the nerves of your feet.  Also, your urine should be checked yearly as well to make sure excess protein is not present.   If you are checking your blood pressure at home, please record it and bring it to your next office visit.    Follow the Dietary Approaches to Stop Hypertension (DASH) diet (3 servings of fruit and vegetables daily, whole grains, low sodium, low-fat proteins).  See below.    Lastly, when it comes to your cholesterol, the goal is to have the HDL (good cholesterol) >40, and the LDL (bad cholesterol) <100.   It is recommended that you follow a heart healthy, low saturated and trans-fat diet and exercise for 30 minutes at least 5 times a week.     (( Check out the DASH diet = 1.5 Gram Low Sodium Diet   A 1.5 gram sodium diet restricts the amount of sodium in the diet to no more than 1.5 g or 1500 mg daily.  The American Heart Association recommends Americans over the age of 31 to consume no more than 1500 mg of sodium each day to reduce the risk of developing high blood pressure.  Research also shows that limiting sodium may reduce heart attack and stroke risk.  Many foods contain sodium for flavor and sometimes as a preservative.  When the amount of sodium in a diet needs to be low, it is important to know what to look for when choosing foods and drinks.  The following includes some information and guidelines to help make it easier for you to adapt to a low sodium diet.    QUICK TIPS  Do not add salt to food.  Avoid convenience items and fast food.  Choose unsalted snack foods.  Buy lower sodium products, often labeled as "lower sodium" or "no salt added."  Check food labels to learn how much sodium is in 1 serving.  When eating at a restaurant, ask that your food be prepared with less salt or none, if possible.    READING FOOD LABELS FOR SODIUM INFORMATION  The nutrition facts label is  a good place to find how much sodium is in foods. Look for products with no more than 400 mg of sodium per serving.  Remember that 1.5 g = 1500 mg.  The food label may also list foods as:  Sodium-free: Less than 5 mg in a serving.  Very low sodium: 35 mg or less in a serving.  Low-sodium: 140 mg or less in a serving.  Light in  sodium: 50% less sodium in a serving. For example, if a food that usually has 300 mg of sodium is changed to become light in sodium, it will have 150 mg of sodium.  Reduced sodium: 25% less sodium in a serving. For example, if a food that usually has 400 mg of sodium is changed to reduced sodium, it will have 300 mg of sodium.    CHOOSING FOODS  Grains  Avoid: Salted crackers and snack items. Some cereals, including instant hot cereals. Bread stuffing and biscuit mixes. Seasoned rice or pasta mixes.  Choose: Unsalted snack items. Low-sodium cereals, oats, puffed wheat and rice, shredded wheat. English muffins and bread. Pasta.  Meats  Avoid: Salted, canned, smoked, spiced, pickled meats, including fish and poultry. Bacon, ham, sausage, cold cuts, hot dogs, anchovies.  Choose: Low-sodium canned tuna and salmon. Fresh or frozen meat, poultry, and fish.  Dairy  Avoid: Processed cheese and spreads. Cottage cheese. Buttermilk and condensed milk. Regular cheese.  Choose: Milk. Low-sodium cottage cheese. Yogurt. Sour cream. Low-sodium cheese.  Fruits and Vegetables  Avoid: Regular canned vegetables. Regular canned tomato sauce and paste. Frozen vegetables in sauces. Olives. Angie Fava. Relishes. Sauerkraut.  Choose: Low-sodium canned vegetables. Low-sodium tomato sauce and paste. Frozen or fresh vegetables. Fresh and frozen fruit.  Condiments  Avoid: Canned and packaged gravies. Worcestershire sauce. Tartar sauce. Barbecue sauce. Soy sauce. Steak sauce. Ketchup. Onion, garlic, and table salt. Meat flavorings and tenderizers.  Choose: Fresh and dried herbs and spices. Low-sodium  varieties of mustard and ketchup. Lemon juice. Tabasco sauce. Horseradish.    SAMPLE 1.5 GRAM SODIUM MEAL PLAN:   Breakfast / Sodium (mg)  1 cup low-fat milk / 143 mg  1 whole-wheat English muffin / 240 mg  1 tbs heart-healthy margarine / 153 mg  1 hard-boiled egg / 139 mg  1 small orange / 0 mg  Lunch / Sodium (mg)  1 cup raw carrots / 76 mg  2 tbs no salt added peanut butter / 5 mg  2 slices whole-wheat bread / 270 mg  1 tbs jelly / 6 mg   cup red grapes / 2 mg  Dinner / Sodium (mg)  1 cup whole-wheat pasta / 2 mg  1 cup low-sodium tomato sauce / 73 mg  3 oz lean ground beef / 57 mg  1 small side salad (1 cup raw spinach leaves,  cup cucumber,  cup yellow bell pepper) with 1 tsp olive oil and 1 tsp red wine vinegar / 25 mg  Snack / Sodium (mg)  1 container low-fat vanilla yogurt / 107 mg  3 graham cracker squares / 127 mg  Nutrient Analysis  Calories: 1745  Protein: 75 g  Carbohydrate: 237 g  Fat: 57 g  Sodium: 1425 mg  Document Released: 02/02/2005 Document Revised: 10/15/2010 Document Reviewed: 05/06/2009  ExitCare Patient Information 2012 [place].))    This information is not intended to replace advice given to you by your health care provider. Make sure you discuss any questions you have with your health care provider. Document Released: 02/05/2003 Document Revised: 08/23/2015 Document Reviewed: 07/15/2015 Elsevier Interactive Patient Education  2017 [employer].

## 2017-09-07 NOTE — Telephone Encounter (Signed)
Resent the corrected immunization. MPulliam, CMA/RT(R)

## 2017-09-07 NOTE — Telephone Encounter (Signed)
Amy from [place] states Tdap vaccine ordered is for patient that are 25yrs old and Younger:  Tdap (ADACEL) 06-17-13.5 LF-MCG/0.5 injection [[id number]]   Order Details  Dose: 0.5 mL Route: Intramuscular Frequency: Once  Dispense Quantity: 0.5 mL Refills: 0 Fills remaining: --        Sig: Inject 0.5 mLs into the muscle once for 1 dose.      ------------ Per pharmacist ( Boostrix ) is the Tdap vaccine for pt's older than 64 years -----  ----- Amy ask that someone call them back to clarify which one patient should be given.  ----Forwarding  essage to medical assistant

## 2017-09-07 NOTE — Progress Notes (Signed)
Subjective:   [name] [name] is a 72 y.o. male who presents for Medicare Annual/Subsequent preventive examination.  -Patient does state he fell within the past year one time in the middle the night when he got up from the bed and felt groggy.  He then fell to his side and caught himself on the corner of his nightstand and broke his left fifth digit.  He does have a wheeled walker and a cane but mostly uses the cane.  He is going to see Dr. Nicki Reaper for dental care in the near future  Activities of Daily Living In your present state of health, do you have difficulty performing the following activities?  1- Driving - no 2- Managing money - no 3- Feeding yourself - no 4- Getting from the bed to the chair - no 5- Climbing a flight of stairs - no 6- Preparing food and eating - no 7- Bathing or showering - no 8- Getting dressed - no 9- Getting to the toilet - no 10- Using the toilet - no 11- Moving around from place to place - no  Patient states that he feels safe at home.  Fall Risk  09/07/2017 05/03/2017 04/01/2017 04/01/2017  Falls in the past year? Yes Yes No No  Number falls in past yr: 1 1 - -  Injury with Fall? Yes Yes - -  Risk Factor Category  High Fall Risk - - -  Risk for fall due to : - Impaired balance/gait;Impaired mobility - -  Follow up Falls evaluation completed - - -   Depression screen [facility] 2/9 09/07/2017 08/05/2017 06/09/2017 05/03/2017 04/01/2017  Decreased Interest 0 0 3 3 3   Down, Depressed, Hopeless 0 1 1 2 3   PHQ - 2 Score 0 1 4 5 6   Altered sleeping 3 3 0 3 2  Tired, decreased energy 0 2 3 3 3   Change in appetite 0 2 3 2 3   Feeling bad or failure about yourself  0 1 0 2 3  Trouble concentrating 0 0 0 2 2  Moving slowly or fidgety/restless 0 1 0 2 3  Suicidal thoughts 0 0 0 0 0  PHQ-9 Score 3 10 10 19 22   Difficult doing work/chores Not difficult at all Somewhat difficult Not difficult at all - -   6CIT Screen 09/07/2017  What Year? 0 points  What month? 0  points  What time? 0 points  Count back from 20 0 points  Months in reverse 0 points  Repeat phrase 0 points  Total Score 0    Current Exercise Habits: Home exercise routine, Type of exercise: walking;Other - see comments(bike), Frequency (Times/Week): 3, Intensity: Mild    Functional Status Survey: Is the patient deaf or have difficulty hearing?: No Does the patient have difficulty seeing, even when wearing glasses/contacts?: No Does the patient have difficulty concentrating, remembering, or making decisions?: No Does the patient have difficulty walking or climbing stairs?: No Does the patient have difficulty dressing or bathing?: No Does the patient have difficulty doing errands alone such as visiting a doctor's office or shopping?: No        Objective:    Vitals: BP (!) 150/88   Pulse 63   Ht 5\' 10"  (1.778 m)   Wt 238 lb (108 kg)   SpO2 98%   BMI 34.15 kg/m   Body mass index is 34.15 kg/m.  Advanced Directives 04/07/2017 08/14/2016 07/28/2016 07/20/2016 11/26/2015 11/06/2015 10/28/2015  Does Patient Have a Medical Advance Directive?  Yes Yes Yes Yes No Yes Yes  Type of Paramedic of [place];Living will [place];Living will [place];Living will [place];Living will Healthcare Power of Attorney;Mental [place];Living will [place];Living will  Does patient want to make changes to medical advance directive? - - No - Patient declined - - - No - Patient declined  Copy of [place] in Chart? No - copy requested No - copy requested - No - copy requested No - copy requested No - copy requested No - copy requested  Would patient like information on creating a medical advance directive? - Yes (ED - Information included in AVS) - - - - -  Pre-existing out of facility DNR order (yellow form or pink MOST form) - - - - - - -     Tobacco Social History   Tobacco Use  Smoking Status Former Smoker  . Packs/day: 1.50  . Years: 30.00  . Pack years: 45.00  . Types: Cigarettes  . Last attempt to quit: 02/17/1976  . Years since quitting: 41.5  Smokeless Tobacco Never Used     Counseling given: Not Answered      Past Medical History:  Diagnosis Date  . Anal fistula   . Anemia   . Anxiety    panic attacks  . Arthritis   . CAD (coronary artery disease)    a. s/p CABG 2001; b. LHC (3/09):  S-RCA, L-LAD, S-OM1/dCFX all patent, EF 55-60%;  c. myoview (12/12):  no ischemia, EF 68%;  d. Dob Echo (1/14):  + ECG changes but normal echo => med Rx cont'd  . Cancer [facility]    pt denies  . CHF (congestive heart failure) (HCC)    EF 55%  . Complication of anesthesia    CONFUSION, psychosis after surgery for a week  . DDD (degenerative disc disease), lumbar    cervical thoracic and lumbar  . Diabetes mellitus without complication ([place])   . Diverticulitis   . GERD (gastroesophageal reflux disease)   . Gout   . History of pneumonia    septic pneumonia  . Hyperlipidemia   . Hypertension   . Hypothyroidism   . IBS (irritable bowel syndrome)   . Osteoporosis   . PTSD (post-traumatic stress disorder)   . Scoliosis   . Spinal stenosis    Past Surgical History:  Procedure Laterality Date  . APPENDECTOMY    . BLEPHAROPLASTY Bilateral   . CERVICAL FUSION     times 2  . CHOLECYSTECTOMY    . COLOSTOMY REVERSAL  11/05/2015  . COLOSTOMY TAKEDOWN N/A 11/05/2015   Procedure: COLOSTOMY TAKEDOWN;  Surgeon: Georganna Skeans, MD;  Location: [place];  Service: General;  Laterality: N/A;  . CORONARY ARTERY BYPASS GRAFT     2001 LIMA LAD, SVG OM/Circ dista, SVG PDA.  Cath 2009 with patent grafts  . HAND SURGERY Right   . HIP ARTHROPLASTY Right 03/01/2013   Procedure: ARTHROPLASTY BIPOLAR HIP;  Surgeon: Alta Corning, MD;  Location: [facility];  Service: Orthopedics;  Laterality: Right;  . INCISIONAL HERNIA REPAIR N/A 11/05/2015    Procedure: REPAIR INCISIONAL HERNIA;  Surgeon: Georganna Skeans, MD;  Location: [place];  Service: General;  Laterality: N/A;  . INCISIONAL HERNIA REPAIR N/A 07/27/2016   Procedure: HERNIA REPAIR INCISIONAL;  Surgeon: Ralene Ok, MD;  Location: [place];  Service: General;  Laterality: N/A;  . INSERTION OF MESH N/A  07/27/2016   Procedure: INSERTION OF MESH;  Surgeon: Ralene Ok, MD;  Location: [place];  Service: General;  Laterality: N/A;  . JOINT REPLACEMENT     Right knee  . KNEE ARTHROSCOPY Bilateral   . LAPAROTOMY N/A 07/27/2016   Procedure: EXPLORATORY LAPAROTOMY;  Surgeon: Ralene Ok, MD;  Location: [place];  Service: General;  Laterality: N/A;  . LYSIS OF ADHESION N/A 07/27/2016   Procedure: LYSIS OF ADHESION;  Surgeon: Ralene Ok, MD;  Location: [place];  Service: General;  Laterality: N/A;  . PARTIAL COLECTOMY N/A 04/26/2015   Procedure: EXPLORATORY LAPAROTOMY PARTIAL COLECTOMY WITH COLOSTOMY AND HARTMANN'S;  Surgeon: Georganna Skeans, MD;  Location: [place];  Service: General;  Laterality: N/A;  . PLACEMENT OF SETON     seton stitch  . STERNAL WIRE REMOVAL    . TREATMENT FISTULA ANAL     times 3   Family History  Problem Relation Age of Onset  . Colon cancer Mother   . Heart disease Father   . Heart disease Sister   . Heart disease Brother   . Diabetes Sister   . Heart disease Brother   . Heart disease Brother    Social History   Socioeconomic History  . Marital status: Married    Spouse name: Not on file  . Number of children: Not on file  . Years of education: Not on file  . Highest education level: Not on file  Occupational History  . Not on file  Social Needs  . Financial resource strain: Not on file  . Food insecurity:    Worry: Not on file    Inability: Not on file  . Transportation needs:    Medical: Not on file    Non-medical: Not on file  Tobacco Use  . Smoking status: Former Smoker    Packs/day: 1.50    Years: 30.00    Pack years: 45.00     Types: Cigarettes    Last attempt to quit: 02/17/1976    Years since quitting: 41.5  . Smokeless tobacco: Never Used  Substance and Sexual Activity  . Alcohol use: No    Comment: over a year   . Drug use: No    Comment: pt denies  . Sexual activity: Not Currently    Birth control/protection: None  Lifestyle  . Physical activity:    Days per week: Not on file    Minutes per session: Not on file  . Stress: Not on file  Relationships  . Social connections:    Talks on phone: Not on file    Gets together: Not on file    Attends religious service: Not on file    Active member of club or organization: Not on file    Attends meetings of clubs or organizations: Not on file    Relationship status: Not on file  Other Topics Concern  . Not on file  Social History Narrative  . Not on file    Outpatient Encounter Medications as of 09/07/2017  Medication Sig  . allopurinol (ZYLOPRIM) 300 MG tablet Take 1 tablet (300 mg total) by mouth at bedtime.  Marland Kitchen aspirin EC 81 MG tablet Take 1 tablet (81 mg total) by mouth daily.  . B Complex-C (B-COMPLEX WITH VITAMIN C) tablet Take 1 tablet by mouth daily with breakfast.   . calcium carbonate (TUMS - DOSED IN MG ELEMENTAL CALCIUM) 500 MG chewable tablet Chew 1-3 tablets by mouth 3 (three) times daily as needed for indigestion or  heartburn.  . cholecalciferol (VITAMIN D) 1000 units tablet Take 3,000 Units by mouth daily.  . clonazePAM (KLONOPIN) 1 MG tablet Take 1 tablet (1 mg total) by mouth 2 (two) times daily as needed for anxiety.  . Cobalamine Combinations (K-16 + FOLIC ACID) 0109-323 MCG TBDP Take 1 tablet by mouth daily.  Marland Kitchen docusate sodium (COLACE) 100 MG capsule Take 100 mg by mouth 4 (four) times daily as needed (for constipation).  . furosemide (LASIX) 20 MG tablet Take a 20 mg tablet daily or as directed  . gabapentin (NEURONTIN) 300 MG capsule Take 3 capsules (900 mg total) by mouth at bedtime.  . Guaifenesin (MUCINEX MAXIMUM STRENGTH) 1200 MG  TB12 Take 1 tablet by mouth 2 (two) times daily.  Marland Kitchen HYDROcodone-acetaminophen (NORCO) 10-325 MG tablet Take 1 tablet by mouth every 6 (six) hours as needed (for pain).   . hydrocortisone cream 1 % Apply 1 application topically daily as needed for itching (for feet).  Marland Kitchen levothyroxine (SYNTHROID, LEVOTHROID) 75 MCG tablet Take 75 mcg by mouth daily before breakfast.   . losartan (COZAAR) 50 MG tablet Take 1 tablet (50 mg total) by mouth daily.  . Magnesium 500 MG TABS Take 500 mg by mouth daily with breakfast.  . Melatonin 5 MG CAPS Take 1 capsule by mouth daily.  . metFORMIN (GLUCOPHAGE-XR) 500 MG 24 hr tablet Take 1,000 mg by mouth at bedtime.  . metoprolol tartrate (LOPRESSOR) 100 MG tablet Take 1 tablet (100 mg total) by mouth 2 (two) times daily.  . nitroGLYCERIN (NITROSTAT) 0.4 MG SL tablet DISSOLVE 1 TABLET UNDER THE TONGUE EVERY 5 MINUTES FOR UP TO 3 DOSES AS NEEDED FOR CHEST PAIN. IF NO RELIEF AFTER 3 DOSES, CALL 911 OR GO TO  . omeprazole (PRILOSEC) 20 MG capsule Take 1 capsule (20 mg total) by mouth daily with breakfast.  . Polyethyl Glycol-Propyl Glycol (SYSTANE OP) Apply to eye.  . potassium gluconate 595 (99 K) MG TABS tablet Take 595 mg by mouth daily with breakfast.   . Probiotic Product (FLORAJEN3 PO) Take 1 capsule by mouth daily with breakfast.  . venlafaxine XR (EFFEXOR-XR) 75 MG 24 hr capsule Take 225 mg by mouth at bedtime.  . Vitamin D, Ergocalciferol, (DRISDOL) 50000 units CAPS capsule Take 1 capsule (50,000 Units total) by mouth every 7 (seven) days.  . Tdap (ADACEL) 06-17-13.5 LF-MCG/0.5 injection Inject 0.5 mLs into the muscle once for 1 dose.   No facility-administered encounter medications on file as of 09/07/2017.     Activities of Daily Living In your present state of health, do you have any difficulty performing the following activities: 09/07/2017  Hearing? N  Vision? N  Difficulty concentrating or making decisions? N  Walking or climbing stairs? N  Dressing or  bathing? N  Doing errands, shopping? N  Some recent data might be hidden    Patient Care Team: Mellody Dance, DO as PCP - General (Family Medicine) Minus Breeding, MD as PCP - Cardiology (Cardiology) Minus Breeding, MD as Consulting Physician (Cardiology) Dorna Leitz, MD as Consulting Physician (Orthopedic Surgery) Idamae Lusher, MD (Ophthalmology) Clydell Hakim, MD as Consulting Physician (Anesthesiology) Allyn Kenner, MD (Dermatology) Ralene Ok, MD as Consulting Physician (General Surgery) Fanny Skates, MD as Consulting Physician (General Surgery) Georganna Skeans, MD as Consulting Physician (General Surgery) Wonda Horner, MD as Consulting Physician (Gastroenterology)   Assessment:   This is a routine wellness examination for Marce.  Exercise Activities and Dietary recommendations Current Exercise Habits: Home exercise routine, Type  of exercise: walking;Other - see comments(bike), Frequency (Times/Week): 3, Intensity: Mild  Goals    None      Fall Risk Fall Risk  09/07/2017 05/03/2017 04/01/2017 04/01/2017  Falls in the past year? Yes Yes No No  Number falls in past yr: 1 1 - -  Injury with Fall? Yes Yes - -  Risk Factor Category  High Fall Risk - - -  Risk for fall due to : - Impaired balance/gait;Impaired mobility - -  Follow up Falls evaluation completed - - -   Is the patient's home free of loose throw rugs in walkways, pet beds, electrical cords, etc?   yes      Grab bars in the bathroom? yes      Handrails on the stairs?   yes      Adequate lighting?   yes  Timed Get Up and Go Performed: passed   Depression Screen PHQ 2/9 Scores 09/07/2017 08/05/2017 06/09/2017 05/03/2017  PHQ - 2 Score 0 1 4 5   PHQ- 9 Score 3 10 10 19     Cognitive Function     6CIT Screen 09/07/2017  What Year? 0 points  What month? 0 points  What time? 0 points  Count back from 20 0 points  Months in reverse 0 points  Repeat phrase 0 points  Total Score 0    Immunization  History  Administered Date(s) Administered  . DT 10/20/2011  . Influenza,inj,Quad PF,6+ Mos 11/10/2015  . Pneumococcal Conjugate-13 05/29/2014  . Pneumococcal Polysaccharide-23 07/29/2016  . Zoster 10/20/2007    Qualifies for Shingles Vaccine? Yes, patient was provided with information and advise to call insurance about coverage.   Screening Tests Health Maintenance  Topic Date Due  . FOOT EXAM  09/15/2017 (Originally 01/12/1956)  . TETANUS/TDAP  04/01/2018 (Originally 01/12/1965)  . Hepatitis C Screening  04/01/2018 (Originally 1946-01-05)  . COLONOSCOPY  06/10/2018 (Originally 01/12/1995)  . INFLUENZA VACCINE  09/16/2017  . HEMOGLOBIN A1C  10/31/2017  . OPHTHALMOLOGY EXAM  01/04/2018  . PNA vac Low Risk Adult  Completed   Cancer Screenings: Lung: Low Dose CT Chest recommended if Age 88-80 years, 30 pack-year currently smoking OR have quit w/in 15years. Patient does not qualify.   Additional Screenings:All additional screenings of patient agreed to was done today.       Plan:    I have personally reviewed and noted the following in the patient's chart:   - Patient was given Tdap, Shingrix, fecal occult blood test for home  - AAA scan ultrasound was ordered today since patient states he is never had one.  - Patient quit smoking in 1978 and therefore does not meet qualifications for CT chest to rule out lung cancer.  - Also ordered a hepatitis C and HIV labs today which patient does not know where the results are or how to get them since he had those test many, many years in the past.  . Medical and social history . Use of alcohol, tobacco or illicit drugs  . Current medications and supplements . Functional ability and status . Nutritional status . Physical activity . Advanced directives . List of other physicians . Hospitalizations, surgeries, and ER visits in previous 12 months . Vitals . Screenings to include cognitive, depression, and falls . Referrals and  appointments . -  Pt told to use wheeled walker to support him during nighttime to prevent falls  In addition, I have reviewed and discussed with patient certain preventive protocols, quality metrics, and best  practice recommendations. A written personalized care plan for preventive services as well as general preventive health recommendations were provided to patient.     Mellody Dance, DO  09/07/2017

## 2017-09-08 LAB — HEPATITIS C ANTIBODY

## 2017-09-08 LAB — HIV ANTIBODY (ROUTINE TESTING W REFLEX): HIV SCREEN 4TH GENERATION: NONREACTIVE

## 2017-09-28 ENCOUNTER — Ambulatory Visit (HOSPITAL_COMMUNITY)
Admission: RE | Admit: 2017-09-28 | Discharge: 2017-09-28 | Disposition: A | Discharge status: Home / Self Care | Payer: Medicare Other | Source: Ambulatory Visit | Attending: Cardiology | Admitting: Cardiology

## 2017-09-28 DIAGNOSIS — Z136 Encounter for screening for cardiovascular disorders: Secondary | ICD-10-CM | POA: Insufficient documentation

## 2017-09-29 ENCOUNTER — Other Ambulatory Visit: Payer: Self-pay | Admitting: General Surgery

## 2017-09-29 DIAGNOSIS — Z8719 Personal history of other diseases of the digestive system: Secondary | ICD-10-CM

## 2017-09-29 DIAGNOSIS — Z9889 Other specified postprocedural states: Principal | ICD-10-CM

## 2017-10-01 ENCOUNTER — Ambulatory Visit
Admission: RE | Admit: 2017-10-01 | Discharge: 2017-10-01 | Disposition: A | Discharge status: Home / Self Care | Payer: Medicare Other | Source: Ambulatory Visit | Attending: General Surgery | Admitting: General Surgery

## 2017-10-01 DIAGNOSIS — Z8719 Personal history of other diseases of the digestive system: Secondary | ICD-10-CM

## 2017-10-01 DIAGNOSIS — Z9889 Other specified postprocedural states: Principal | ICD-10-CM

## 2017-10-14 ENCOUNTER — Telehealth: Payer: Self-pay | Admitting: Cardiology

## 2017-10-14 NOTE — Telephone Encounter (Signed)
New Message:   Pt c/o Shortness Of Breath: STAT if SOB developed within the last 24 hours or pt is noticeably SOB on the phone  1. Are you currently SOB (can you hear that pt is SOB on the phone)?  No   2. How long have you been experiencing SOB? Today   3. Are you SOB when sitting or when up moving around? Sitting   4. Are you currently experiencing any other symptoms? Patient  Wife is sleeping a lot lately

## 2017-10-14 NOTE — Telephone Encounter (Signed)
Returned call to patient's wife. She states patient is not having SOB "like an emergency kind of thing". She states he is "declining in health". She states he is in the bed a lot. She states this has been going on for several months but it is worse today. She states he is having trouble getting his breath today. He is not active, not walking as he should, coughs a lot especially at night, fatigued, lack of appetite. He swells in abdomen often but no LE edema. He does not weigh regularly. He usually takes lasix 20mg  daily but took 2 lasix today to help with breathing. Wife states that these symptoms are nothing new, just different from his baseline.  Wife is asking for an inhaler to help him breathe easier. He uses mucinex BID.  Will route to MD/CMA for review and advice

## 2017-10-15 ENCOUNTER — Telehealth: Payer: Self-pay | Admitting: Family Medicine

## 2017-10-15 NOTE — Telephone Encounter (Signed)
Pt's wife/ Sunday Spillers left message on office voicemail stating pt needs an Inhaler-- called back to advise provider is out of office until 10/22/17-- Wife states if provider unable to prescribe Rx inhaler she will contact his cardiologist .  --Forwarding message to medical assistant as an [place].  --glh

## 2017-10-19 ENCOUNTER — Other Ambulatory Visit: Payer: Self-pay | Admitting: Family Medicine

## 2017-10-19 NOTE — Telephone Encounter (Signed)
Returned call to wife. She reports he was just having a bad day last week, it was just a "one day thing". He is doing better now. She reports he had been on Effexor by old PCP but his new PCP would not prescribe it for his PTSD and wanted him to see a psychiatrist. He had been out of these med and had high anxiety last week. She feels his body was reacting to his mental anguish.   No further assistance needed

## 2017-10-19 NOTE — Telephone Encounter (Signed)
Need to call the patient.  If he is still having SOB he probably needs to be in the ED.

## 2017-10-20 ENCOUNTER — Other Ambulatory Visit: Payer: Self-pay

## 2017-10-20 DIAGNOSIS — Z23 Encounter for immunization: Secondary | ICD-10-CM

## 2017-10-20 MED ORDER — ZOSTER VAC RECOMB ADJUVANTED 50 MCG/0.5ML IM SUSR: 0.5000 mL | Freq: Once | INTRAMUSCULAR | 0 refills | Status: AC

## 2017-10-20 NOTE — Telephone Encounter (Signed)
Called patient and he states that he is feeling better.  Patient states that he was SOB one night due to anxiety flare up and states that it has improved. MPulliam, CMA/RT(R)

## 2017-11-08 ENCOUNTER — Other Ambulatory Visit: Payer: Self-pay

## 2017-11-08 ENCOUNTER — Encounter (HOSPITAL_COMMUNITY): Payer: Self-pay

## 2017-11-08 ENCOUNTER — Inpatient Hospital Stay (HOSPITAL_COMMUNITY)
Admission: AD | Admit: 2017-11-08 | Discharge: 2017-12-17 | DRG: 466 | Disposition: E | Payer: Medicare Other | Source: Other Acute Inpatient Hospital | Attending: Internal Medicine | Admitting: Internal Medicine

## 2017-11-08 DIAGNOSIS — M978XXA Periprosthetic fracture around other internal prosthetic joint, initial encounter: Secondary | ICD-10-CM

## 2017-11-08 DIAGNOSIS — F329 Major depressive disorder, single episode, unspecified: Secondary | ICD-10-CM | POA: Diagnosis present

## 2017-11-08 DIAGNOSIS — Z515 Encounter for palliative care: Secondary | ICD-10-CM | POA: Diagnosis not present

## 2017-11-08 DIAGNOSIS — M5136 Other intervertebral disc degeneration, lumbar region: Secondary | ICD-10-CM | POA: Diagnosis present

## 2017-11-08 DIAGNOSIS — S72001A Fracture of unspecified part of neck of right femur, initial encounter for closed fracture: Secondary | ICD-10-CM | POA: Diagnosis not present

## 2017-11-08 DIAGNOSIS — Z833 Family history of diabetes mellitus: Secondary | ICD-10-CM

## 2017-11-08 DIAGNOSIS — E876 Hypokalemia: Secondary | ICD-10-CM | POA: Diagnosis not present

## 2017-11-08 DIAGNOSIS — M810 Age-related osteoporosis without current pathological fracture: Secondary | ICD-10-CM | POA: Diagnosis present

## 2017-11-08 DIAGNOSIS — Z978 Presence of other specified devices: Secondary | ICD-10-CM

## 2017-11-08 DIAGNOSIS — Z87891 Personal history of nicotine dependence: Secondary | ICD-10-CM

## 2017-11-08 DIAGNOSIS — Y92019 Unspecified place in single-family (private) house as the place of occurrence of the external cause: Secondary | ICD-10-CM

## 2017-11-08 DIAGNOSIS — S72111A Displaced fracture of greater trochanter of right femur, initial encounter for closed fracture: Principal | ICD-10-CM | POA: Diagnosis present

## 2017-11-08 DIAGNOSIS — E118 Type 2 diabetes mellitus with unspecified complications: Secondary | ICD-10-CM | POA: Diagnosis not present

## 2017-11-08 DIAGNOSIS — R509 Fever, unspecified: Secondary | ICD-10-CM | POA: Diagnosis not present

## 2017-11-08 DIAGNOSIS — M419 Scoliosis, unspecified: Secondary | ICD-10-CM | POA: Diagnosis present

## 2017-11-08 DIAGNOSIS — E871 Hypo-osmolality and hyponatremia: Secondary | ICD-10-CM | POA: Diagnosis present

## 2017-11-08 DIAGNOSIS — K567 Ileus, unspecified: Secondary | ICD-10-CM | POA: Diagnosis not present

## 2017-11-08 DIAGNOSIS — Z7189 Other specified counseling: Secondary | ICD-10-CM | POA: Diagnosis not present

## 2017-11-08 DIAGNOSIS — Z8249 Family history of ischemic heart disease and other diseases of the circulatory system: Secondary | ICD-10-CM

## 2017-11-08 DIAGNOSIS — Z800 Family history of malignant neoplasm of digestive organs: Secondary | ICD-10-CM

## 2017-11-08 DIAGNOSIS — W1830XA Fall on same level, unspecified, initial encounter: Secondary | ICD-10-CM | POA: Diagnosis present

## 2017-11-08 DIAGNOSIS — R062 Wheezing: Secondary | ICD-10-CM

## 2017-11-08 DIAGNOSIS — I440 Atrioventricular block, first degree: Secondary | ICD-10-CM | POA: Diagnosis present

## 2017-11-08 DIAGNOSIS — I10 Essential (primary) hypertension: Secondary | ICD-10-CM | POA: Diagnosis not present

## 2017-11-08 DIAGNOSIS — I5032 Chronic diastolic (congestive) heart failure: Secondary | ICD-10-CM | POA: Diagnosis present

## 2017-11-08 DIAGNOSIS — I472 Ventricular tachycardia: Secondary | ICD-10-CM | POA: Diagnosis not present

## 2017-11-08 DIAGNOSIS — D62 Acute posthemorrhagic anemia: Secondary | ICD-10-CM | POA: Diagnosis not present

## 2017-11-08 DIAGNOSIS — Z66 Do not resuscitate: Secondary | ICD-10-CM | POA: Diagnosis present

## 2017-11-08 DIAGNOSIS — J690 Pneumonitis due to inhalation of food and vomit: Secondary | ICD-10-CM | POA: Diagnosis not present

## 2017-11-08 DIAGNOSIS — M109 Gout, unspecified: Secondary | ICD-10-CM | POA: Diagnosis present

## 2017-11-08 DIAGNOSIS — Z9049 Acquired absence of other specified parts of digestive tract: Secondary | ICD-10-CM

## 2017-11-08 DIAGNOSIS — I2510 Atherosclerotic heart disease of native coronary artery without angina pectoris: Secondary | ICD-10-CM | POA: Diagnosis present

## 2017-11-08 DIAGNOSIS — N179 Acute kidney failure, unspecified: Secondary | ICD-10-CM | POA: Diagnosis not present

## 2017-11-08 DIAGNOSIS — R0602 Shortness of breath: Secondary | ICD-10-CM

## 2017-11-08 DIAGNOSIS — R0989 Other specified symptoms and signs involving the circulatory and respiratory systems: Secondary | ICD-10-CM

## 2017-11-08 DIAGNOSIS — F410 Panic disorder [episodic paroxysmal anxiety] without agoraphobia: Secondary | ICD-10-CM | POA: Diagnosis present

## 2017-11-08 DIAGNOSIS — R0603 Acute respiratory distress: Secondary | ICD-10-CM

## 2017-11-08 DIAGNOSIS — Z79899 Other long term (current) drug therapy: Secondary | ICD-10-CM

## 2017-11-08 DIAGNOSIS — N183 Chronic kidney disease, stage 3 (moderate): Secondary | ICD-10-CM | POA: Diagnosis present

## 2017-11-08 DIAGNOSIS — Z79891 Long term (current) use of opiate analgesic: Secondary | ICD-10-CM

## 2017-11-08 DIAGNOSIS — I471 Supraventricular tachycardia: Secondary | ICD-10-CM | POA: Diagnosis not present

## 2017-11-08 DIAGNOSIS — G894 Chronic pain syndrome: Secondary | ICD-10-CM | POA: Diagnosis present

## 2017-11-08 DIAGNOSIS — Z951 Presence of aortocoronary bypass graft: Secondary | ICD-10-CM

## 2017-11-08 DIAGNOSIS — Z96649 Presence of unspecified artificial hip joint: Secondary | ICD-10-CM | POA: Diagnosis not present

## 2017-11-08 DIAGNOSIS — E785 Hyperlipidemia, unspecified: Secondary | ICD-10-CM | POA: Diagnosis present

## 2017-11-08 DIAGNOSIS — I4891 Unspecified atrial fibrillation: Secondary | ICD-10-CM | POA: Diagnosis not present

## 2017-11-08 DIAGNOSIS — F05 Delirium due to known physiological condition: Secondary | ICD-10-CM | POA: Diagnosis not present

## 2017-11-08 DIAGNOSIS — Z888 Allergy status to other drugs, medicaments and biological substances status: Secondary | ICD-10-CM

## 2017-11-08 DIAGNOSIS — Z981 Arthrodesis status: Secondary | ICD-10-CM

## 2017-11-08 DIAGNOSIS — Z96651 Presence of right artificial knee joint: Secondary | ICD-10-CM | POA: Diagnosis present

## 2017-11-08 DIAGNOSIS — E039 Hypothyroidism, unspecified: Secondary | ICD-10-CM | POA: Diagnosis present

## 2017-11-08 DIAGNOSIS — M4800 Spinal stenosis, site unspecified: Secondary | ICD-10-CM | POA: Diagnosis present

## 2017-11-08 DIAGNOSIS — Z01818 Encounter for other preprocedural examination: Secondary | ICD-10-CM

## 2017-11-08 DIAGNOSIS — Z79890 Hormone replacement therapy (postmenopausal): Secondary | ICD-10-CM

## 2017-11-08 DIAGNOSIS — M9701XA Periprosthetic fracture around internal prosthetic right hip joint, initial encounter: Secondary | ICD-10-CM | POA: Diagnosis present

## 2017-11-08 DIAGNOSIS — M25551 Pain in right hip: Secondary | ICD-10-CM | POA: Diagnosis not present

## 2017-11-08 DIAGNOSIS — S72001D Fracture of unspecified part of neck of right femur, subsequent encounter for closed fracture with routine healing: Secondary | ICD-10-CM | POA: Diagnosis not present

## 2017-11-08 DIAGNOSIS — K219 Gastro-esophageal reflux disease without esophagitis: Secondary | ICD-10-CM | POA: Diagnosis present

## 2017-11-08 DIAGNOSIS — I130 Hypertensive heart and chronic kidney disease with heart failure and stage 1 through stage 4 chronic kidney disease, or unspecified chronic kidney disease: Secondary | ICD-10-CM | POA: Diagnosis present

## 2017-11-08 DIAGNOSIS — Z885 Allergy status to narcotic agent status: Secondary | ICD-10-CM

## 2017-11-08 DIAGNOSIS — F39 Unspecified mood [affective] disorder: Secondary | ICD-10-CM | POA: Diagnosis not present

## 2017-11-08 DIAGNOSIS — M1990 Unspecified osteoarthritis, unspecified site: Secondary | ICD-10-CM | POA: Diagnosis present

## 2017-11-08 DIAGNOSIS — F4310 Post-traumatic stress disorder, unspecified: Secondary | ICD-10-CM | POA: Diagnosis present

## 2017-11-08 DIAGNOSIS — Z881 Allergy status to other antibiotic agents status: Secondary | ICD-10-CM

## 2017-11-08 DIAGNOSIS — K589 Irritable bowel syndrome without diarrhea: Secondary | ICD-10-CM | POA: Diagnosis present

## 2017-11-08 DIAGNOSIS — E1122 Type 2 diabetes mellitus with diabetic chronic kidney disease: Secondary | ICD-10-CM | POA: Diagnosis present

## 2017-11-08 DIAGNOSIS — Z7982 Long term (current) use of aspirin: Secondary | ICD-10-CM

## 2017-11-08 MED ORDER — MUPIROCIN 2 % EX OINT
1.0000 "application " | TOPICAL_OINTMENT | Freq: Two times a day (BID) | CUTANEOUS | Status: AC
  Administered 2017-11-09 – 2017-11-10 (×3): 1 via NASAL
  Filled 2017-11-08: qty 22

## 2017-11-09 ENCOUNTER — Other Ambulatory Visit: Payer: Self-pay | Admitting: Orthopedic Surgery

## 2017-11-09 ENCOUNTER — Encounter (HOSPITAL_COMMUNITY): Payer: Self-pay | Admitting: Family Medicine

## 2017-11-09 ENCOUNTER — Inpatient Hospital Stay (HOSPITAL_COMMUNITY): Payer: Medicare Other

## 2017-11-09 DIAGNOSIS — S72001A Fracture of unspecified part of neck of right femur, initial encounter for closed fracture: Secondary | ICD-10-CM | POA: Diagnosis present

## 2017-11-09 LAB — SURGICAL PCR SCREEN
MRSA, PCR: NEGATIVE
Staphylococcus aureus: NEGATIVE

## 2017-11-09 LAB — CBC
HEMATOCRIT: 36.8 % — AB (ref 39.0–52.0)
Hemoglobin: 11.4 g/dL — ABNORMAL LOW (ref 13.0–17.0)
MCH: 27.5 pg (ref 26.0–34.0)
MCHC: 31 g/dL (ref 30.0–36.0)
MCV: 88.9 fL (ref 78.0–100.0)
Platelets: 251 10*3/uL (ref 150–400)
RBC: 4.14 MIL/uL — ABNORMAL LOW (ref 4.22–5.81)
RDW: 13.8 % (ref 11.5–15.5)
WBC: 10.4 10*3/uL (ref 4.0–10.5)

## 2017-11-09 LAB — GLUCOSE, CAPILLARY
GLUCOSE-CAPILLARY: 133 mg/dL — AB (ref 70–99)
GLUCOSE-CAPILLARY: 122 mg/dL — AB (ref 70–99)
GLUCOSE-CAPILLARY: 186 mg/dL — AB (ref 70–99)
GLUCOSE-CAPILLARY: 54 mg/dL — AB (ref 70–99)
Glucose-Capillary: 132 mg/dL — ABNORMAL HIGH (ref 70–99)
Glucose-Capillary: 127 mg/dL — ABNORMAL HIGH (ref 70–99)
Glucose-Capillary: 126 mg/dL — ABNORMAL HIGH (ref 70–99)
Glucose-Capillary: 151 mg/dL — ABNORMAL HIGH (ref 70–99)

## 2017-11-09 LAB — BASIC METABOLIC PANEL
Anion gap: 8 (ref 5–15)
BUN: 10 mg/dL (ref 8–23)
CALCIUM: 8.6 mg/dL — AB (ref 8.9–10.3)
CHLORIDE: 98 mmol/L (ref 98–111)
CO2: 29 mmol/L (ref 22–32)
CREATININE: 1.16 mg/dL (ref 0.61–1.24)
GFR calc non Af Amer: >60 mL/min (ref >60)
Glucose, Bld: 135 mg/dL — ABNORMAL HIGH (ref 70–99)
Potassium: 3.8 mmol/L (ref 3.5–5.1)
Sodium: 135 mmol/L (ref 135–145)

## 2017-11-09 MED ORDER — INSULIN ASPART 100 UNIT/ML ~~LOC~~ SOLN
0.0000 [IU] | SUBCUTANEOUS | Status: DC
  Administered 2017-11-09 – 2017-11-10 (×7): 1 [IU] via SUBCUTANEOUS
  Administered 2017-11-11: 3 [IU] via SUBCUTANEOUS
  Administered 2017-11-11: 1 [IU] via SUBCUTANEOUS
  Administered 2017-11-11 – 2017-11-12 (×4): 2 [IU] via SUBCUTANEOUS
  Administered 2017-11-12 (×3): 1 [IU] via SUBCUTANEOUS
  Administered 2017-11-12: 2 [IU] via SUBCUTANEOUS
  Administered 2017-11-13 (×2): 1 [IU] via SUBCUTANEOUS
  Administered 2017-11-13: 2 [IU] via SUBCUTANEOUS
  Administered 2017-11-13 (×3): 1 [IU] via SUBCUTANEOUS
  Administered 2017-11-14: 2 [IU] via SUBCUTANEOUS
  Administered 2017-11-14: 1 [IU] via SUBCUTANEOUS
  Administered 2017-11-14: 2 [IU] via SUBCUTANEOUS

## 2017-11-09 MED ORDER — VITAMIN D 1000 UNITS PO TABS
3000.0000 [IU] | ORAL_TABLET | Freq: Every day | ORAL | Status: DC
  Administered 2017-11-09 – 2017-11-10 (×2): 3000 [IU] via ORAL
  Filled 2017-11-09 (×3): qty 3

## 2017-11-09 MED ORDER — ROPINIROLE HCL 0.5 MG PO TABS
0.5000 mg | ORAL_TABLET | Freq: Every day | ORAL | Status: DC
  Administered 2017-11-09 – 2017-11-13 (×4): 0.5 mg via ORAL
  Filled 2017-11-09 (×4): qty 1

## 2017-11-09 MED ORDER — HYDROMORPHONE HCL 1 MG/ML IJ SOLN
1.0000 mg | Freq: Once | INTRAMUSCULAR | Status: DC
  Filled 2017-11-09: qty 1

## 2017-11-09 MED ORDER — TRANEXAMIC ACID 1000 MG/10ML IV SOLN
2000.0000 mg | Freq: Once | INTRAVENOUS | Status: DC
  Filled 2017-11-09: qty 20

## 2017-11-09 MED ORDER — CHLORHEXIDINE GLUCONATE 4 % EX LIQD
60.0000 mL | Freq: Once | CUTANEOUS | Status: AC
  Administered 2017-11-09: 4 via TOPICAL
  Filled 2017-11-09: qty 15

## 2017-11-09 MED ORDER — PANTOPRAZOLE SODIUM 40 MG PO TBEC
40.0000 mg | DELAYED_RELEASE_TABLET | Freq: Every day | ORAL | Status: DC
  Administered 2017-11-09 – 2017-11-14 (×6): 40 mg via ORAL
  Filled 2017-11-09 (×5): qty 1

## 2017-11-09 MED ORDER — BISACODYL 5 MG PO TBEC
5.0000 mg | DELAYED_RELEASE_TABLET | Freq: Every day | ORAL | Status: DC | PRN
  Administered 2017-11-14: 5 mg via ORAL
  Filled 2017-11-09: qty 1

## 2017-11-09 MED ORDER — DIPHENHYDRAMINE HCL 50 MG/ML IJ SOLN
25.0000 mg | Freq: Four times a day (QID) | INTRAMUSCULAR | Status: DC | PRN
  Administered 2017-11-13: 25 mg via INTRAVENOUS
  Filled 2017-11-09 (×2): qty 1

## 2017-11-09 MED ORDER — POLYETHYLENE GLYCOL 3350 17 G PO PACK
17.0000 g | PACK | Freq: Every day | ORAL | Status: DC
  Administered 2017-11-09 – 2017-11-13 (×4): 17 g via ORAL
  Filled 2017-11-09 (×4): qty 1

## 2017-11-09 MED ORDER — VENLAFAXINE HCL ER 75 MG PO CP24
225.0000 mg | ORAL_CAPSULE | Freq: Every day | ORAL | Status: DC
  Administered 2017-11-09: 225 mg via ORAL
  Administered 2017-11-09: 75 mg via ORAL
  Administered 2017-11-11 – 2017-11-13 (×3): 225 mg via ORAL
  Filled 2017-11-09 (×5): qty 1

## 2017-11-09 MED ORDER — MORPHINE SULFATE (PF) 2 MG/ML IV SOLN: 1.0000 mg | INTRAVENOUS | Status: DC | PRN

## 2017-11-09 MED ORDER — MORPHINE SULFATE (PF) 4 MG/ML IV SOLN
3.0000 mg | INTRAVENOUS | Status: DC | PRN
  Administered 2017-11-09 (×4): 3 mg via INTRAVENOUS
  Filled 2017-11-09 (×4): qty 1

## 2017-11-09 MED ORDER — CLONAZEPAM 1 MG PO TABS
1.0000 mg | ORAL_TABLET | Freq: Two times a day (BID) | ORAL | Status: DC | PRN
  Administered 2017-11-09: 1 mg via ORAL
  Filled 2017-11-09: qty 1

## 2017-11-09 MED ORDER — GABAPENTIN 300 MG PO CAPS
900.0000 mg | ORAL_CAPSULE | Freq: Every day | ORAL | Status: DC
  Administered 2017-11-09 – 2017-11-13 (×5): 900 mg via ORAL
  Filled 2017-11-09 (×5): qty 3

## 2017-11-09 MED ORDER — HYDRALAZINE HCL 20 MG/ML IJ SOLN: 10.0000 mg | INTRAMUSCULAR | Status: DC | PRN

## 2017-11-09 MED ORDER — ALLOPURINOL 300 MG PO TABS
300.0000 mg | ORAL_TABLET | Freq: Every day | ORAL | Status: DC
  Administered 2017-11-09 – 2017-11-13 (×4): 300 mg via ORAL
  Filled 2017-11-09 (×4): qty 1

## 2017-11-09 MED ORDER — MORPHINE SULFATE (PF) 4 MG/ML IV SOLN: 3.0000 mg | INTRAVENOUS | Status: DC | PRN

## 2017-11-09 MED ORDER — POLYETHYLENE GLYCOL 3350 17 G PO PACK: 17.0000 g | PACK | Freq: Every day | ORAL | Status: DC | PRN

## 2017-11-09 MED ORDER — SODIUM CHLORIDE 0.9 % IV SOLN
INTRAVENOUS | Status: AC
  Administered 2017-11-09: 02:00:00 via INTRAVENOUS

## 2017-11-09 MED ORDER — CLONAZEPAM 1 MG PO TABS
2.0000 mg | ORAL_TABLET | Freq: Every day | ORAL | Status: DC
  Administered 2017-11-09: 2 mg via ORAL
  Filled 2017-11-09: qty 2

## 2017-11-09 MED ORDER — B COMPLEX-C PO TABS
1.0000 | ORAL_TABLET | Freq: Every day | ORAL | Status: DC
  Administered 2017-11-09 – 2017-11-10 (×2): 1 via ORAL
  Filled 2017-11-09 (×3): qty 1

## 2017-11-09 MED ORDER — MORPHINE SULFATE (PF) 2 MG/ML IV SOLN
1.0000 mg | INTRAVENOUS | Status: DC | PRN
  Administered 2017-11-09: 1 mg via INTRAVENOUS
  Administered 2017-11-09 (×2): 2 mg via INTRAVENOUS
  Filled 2017-11-09 (×3): qty 1

## 2017-11-09 MED ORDER — METOPROLOL TARTRATE 100 MG PO TABS
100.0000 mg | ORAL_TABLET | Freq: Two times a day (BID) | ORAL | Status: DC
  Administered 2017-11-09 – 2017-11-14 (×10): 100 mg via ORAL
  Filled 2017-11-09 (×10): qty 1

## 2017-11-09 MED ORDER — LEVOTHYROXINE SODIUM 75 MCG PO TABS
75.0000 ug | ORAL_TABLET | Freq: Every day | ORAL | Status: DC
  Administered 2017-11-09 – 2017-11-14 (×7): 75 ug via ORAL
  Filled 2017-11-09 (×7): qty 1

## 2017-11-09 MED ORDER — GUAIFENESIN ER 600 MG PO TB12
1200.0000 mg | ORAL_TABLET | Freq: Two times a day (BID) | ORAL | Status: DC | PRN
  Administered 2017-11-13 (×2): 1200 mg via ORAL
  Filled 2017-11-09 (×2): qty 2

## 2017-11-09 MED ORDER — HYDROCODONE-ACETAMINOPHEN 10-325 MG PO TABS
1.0000 | ORAL_TABLET | Freq: Four times a day (QID) | ORAL | Status: DC | PRN
  Administered 2017-11-09 (×2): 1 via ORAL
  Filled 2017-11-09 (×2): qty 1

## 2017-11-09 MED ORDER — MORPHINE SULFATE (PF) 2 MG/ML IV SOLN
2.0000 mg | INTRAVENOUS | Status: DC | PRN
  Administered 2017-11-09: 4 mg via INTRAVENOUS
  Filled 2017-11-09: qty 2

## 2017-11-09 MED ORDER — CLONAZEPAM 1 MG PO TABS: 1.0000 mg | ORAL_TABLET | Freq: Three times a day (TID) | ORAL | Status: DC

## 2017-11-09 MED ORDER — CLONAZEPAM 1 MG PO TABS
1.0000 mg | ORAL_TABLET | Freq: Every day | ORAL | Status: DC
  Administered 2017-11-10 – 2017-11-11 (×2): 1 mg via ORAL
  Filled 2017-11-09 (×2): qty 1

## 2017-11-09 MED ORDER — TRANEXAMIC ACID 1000 MG/10ML IV SOLN
1000.0000 mg | INTRAVENOUS | Status: AC
  Administered 2017-11-10: 1000 mg via INTRAVENOUS
  Filled 2017-11-09: qty 1000

## 2017-11-09 MED ORDER — BOOST PLUS PO LIQD
237.0000 mL | Freq: Three times a day (TID) | ORAL | Status: DC
  Administered 2017-11-09 – 2017-11-14 (×10): 237 mL via ORAL
  Filled 2017-11-09 (×17): qty 237

## 2017-11-09 MED ORDER — ONDANSETRON HCL 4 MG/2ML IJ SOLN
4.0000 mg | Freq: Four times a day (QID) | INTRAMUSCULAR | Status: DC | PRN
  Administered 2017-11-14: 4 mg via INTRAVENOUS
  Filled 2017-11-09: qty 2

## 2017-11-09 MED ORDER — SENNOSIDES-DOCUSATE SODIUM 8.6-50 MG PO TABS
2.0000 | ORAL_TABLET | Freq: Every day | ORAL | Status: DC
  Administered 2017-11-09 – 2017-11-13 (×3): 2 via ORAL
  Filled 2017-11-09 (×3): qty 2

## 2017-11-09 MED ORDER — POLYVINYL ALCOHOL 1.4 % OP SOLN
Freq: Three times a day (TID) | OPHTHALMIC | Status: DC | PRN
  Filled 2017-11-09: qty 15

## 2017-11-09 MED ORDER — POVIDONE-IODINE 10 % EX SWAB: 2.0000 "application " | Freq: Once | CUTANEOUS | Status: DC

## 2017-11-09 NOTE — Plan of Care (Signed)
  Problem: Education: Goal: Knowledge of General Education information will improve Description Including pain rating scale, medication(s)/side effects and non-pharmacologic comfort measures Outcome: Progressing   

## 2017-11-09 NOTE — Progress Notes (Signed)
Orthopedic Tech Progress Note Patient Details:  [name] [name] 10-29-1945 [id number]  Musculoskeletal Traction Type of Traction: [place] Skin Traction Traction Location: rle Traction Weight: 5 lbs   Post Interventions Instructions Provided: Care of device   Hildred Priest 11/09/2017, 11:20 AM

## 2017-11-09 NOTE — H&P (Signed)
History and Physical    [name] [name] LKG:[id number] DOB: December 22, 1945 DOA: 10/19/2017  PCP: Mellody Dance, DO   Patient coming from: Home, by way of [place] [facility] [place]   Chief Complaint: Fall with right hip pain   HPI: [name] [name] is a 72 y.o. male with medical history significant for type 2 diabetes mellitus, hypertension, anxiety, coronary artery disease status post CABG, chronic diastolic CHF, and hypothyroidism, now presenting to the emergency department with severe right hip pain after a fall.  Patient reports that he was in his usual state of health and having an uneventful day when he experienced a ground-level mechanical fall at home, landing onto his right buttock and hip.  He experienced immediate and severe pain at the right hip and was unable to bear weight.  Patient reports that he is typically fairly active and can ascend a flight of stairs without any shortness of breath or chest pain.  He denies any recent angina, denies recent cough, shortness of breath, fevers, or chills.  [place] [facility] ED Course: Upon arrival to the ED, patient is found to be afebrile, saturating well on room air, and with vitals otherwise stable.  EKG features a sinus rhythm with first-degree AV nodal block and chest x-ray is notable for scar and cardiomegaly, but no acute cardia pulmonary disease.  Chemistry panel was notable for slight hyponatremia and CBC was unremarkable.  INR was normal.  Patient was given a liter of normal saline, morphine, and Dilaudid in the ED.  Orthopedic surgery was consulted by the ED physician and admission to the medicine service at [facility] was arranged for ongoing evaluation and management.  Review of Systems:  All other systems reviewed and apart from HPI, are negative.  Past Medical History:  Diagnosis Date  . Anal fistula   . Anemia   . Anxiety    panic attacks  . Arthritis   . CAD (coronary artery disease)    a. s/p CABG 2001; b.  LHC (3/09):  S-RCA, L-LAD, S-OM1/dCFX all patent, EF 55-60%;  c. myoview (12/12):  no ischemia, EF 68%;  d. Dob Echo (1/14):  + ECG changes but normal echo => med Rx cont'd  . Cancer [facility])    pt denies  . CHF (congestive heart failure) (HCC)    EF 55%  . Complication of anesthesia    CONFUSION, psychosis after surgery for a week  . DDD (degenerative disc disease), lumbar    cervical thoracic and lumbar  . Diabetes mellitus without complication ([place])   . Diverticulitis   . GERD (gastroesophageal reflux disease)   . Gout   . History of pneumonia    septic pneumonia  . Hyperlipidemia   . Hypertension   . Hypothyroidism   . IBS (irritable bowel syndrome)   . Osteoporosis   . PTSD (post-traumatic stress disorder)   . Scoliosis   . Spinal stenosis     Past Surgical History:  Procedure Laterality Date  . APPENDECTOMY    . BLEPHAROPLASTY Bilateral   . CERVICAL FUSION     times 2  . CHOLECYSTECTOMY    . COLOSTOMY REVERSAL  11/05/2015  . COLOSTOMY TAKEDOWN N/A 11/05/2015   Procedure: COLOSTOMY TAKEDOWN;  Surgeon: Georganna Skeans, MD;  Location: [place];  Service: General;  Laterality: N/A;  . CORONARY ARTERY BYPASS GRAFT     2001 LIMA LAD, SVG OM/Circ dista, SVG PDA.  Cath 2009 with patent grafts  . HAND SURGERY Right   .  HIP ARTHROPLASTY Right 03/01/2013   Procedure: ARTHROPLASTY BIPOLAR HIP;  Surgeon: Alta Corning, MD;  [facility];  Service: Orthopedics;  Laterality: Right;  . INCISIONAL HERNIA REPAIR N/A 11/05/2015   Procedure: REPAIR INCISIONAL HERNIA;  Surgeon: Georganna Skeans, MD;  Location: [place];  Service: General;  Laterality: N/A;  . INCISIONAL HERNIA REPAIR N/A 07/27/2016   Procedure: HERNIA REPAIR INCISIONAL;  Surgeon: Ralene Ok, MD;  Location: [place];  Service: General;  Laterality: N/A;  . INSERTION OF MESH N/A 07/27/2016   Procedure: INSERTION OF MESH;  Surgeon: Ralene Ok, MD;  Location: [place];  Service: General;  Laterality: N/A;  . JOINT REPLACEMENT       Right knee  . KNEE ARTHROSCOPY Bilateral   . LAPAROTOMY N/A 07/27/2016   Procedure: EXPLORATORY LAPAROTOMY;  Surgeon: Ralene Ok, MD;  Location: [place];  Service: General;  Laterality: N/A;  . LYSIS OF ADHESION N/A 07/27/2016   Procedure: LYSIS OF ADHESION;  Surgeon: Ralene Ok, MD;  Location: [place];  Service: General;  Laterality: N/A;  . PARTIAL COLECTOMY N/A 04/26/2015   Procedure: EXPLORATORY LAPAROTOMY PARTIAL COLECTOMY WITH COLOSTOMY AND HARTMANN'S;  Surgeon: Georganna Skeans, MD;  Location: [place];  Service: General;  Laterality: N/A;  . PLACEMENT OF SETON     seton stitch  . STERNAL WIRE REMOVAL    . TREATMENT FISTULA ANAL     times 3     reports that he quit smoking about 41 years ago. His smoking use included cigarettes. He has a 45.00 pack-year smoking history. He has never used smokeless tobacco. He reports that he does not drink alcohol or use drugs.  Allergies  Allergen Reactions  . Ciprofloxacin Hcl Other (See Comments)    Causes pain in tendons, feels like muscle spasms. BLACK BOX WARNING RE: POSSIBLE TENDON RUPTURE, MYALGIAS, and OTHERS  . Fentanyl Other (See Comments)    Pt can handle having Fentanyl under anesthesia - does not tolerate after surgery for post op pain - causes mood swings, confusion, and agitation    . Flomax [Tamsulosin Hcl] Other (See Comments)    DROPS BLOOD PRESSURE  . Ace Inhibitors Cough    Family History  Problem Relation Age of Onset  . Colon cancer Mother   . Heart disease Father   . Heart disease Sister   . Heart disease Brother   . Diabetes Sister   . Heart disease Brother   . Heart disease Brother      Prior to Admission medications   Medication Sig Start Date End Date Taking? Authorizing Provider  allopurinol (ZYLOPRIM) 300 MG tablet Take 1 tablet (300 mg total) by mouth at bedtime. 08/09/17   Mellody Dance, DO  aspirin EC 81 MG tablet Take 1 tablet (81 mg total) by mouth daily. 05/27/17   Barrett, Evelene Croon, PA-C  B  Complex-C (B-COMPLEX WITH VITAMIN C) tablet Take 1 tablet by mouth daily with breakfast.     [provider]  calcium carbonate (TUMS - DOSED IN MG ELEMENTAL CALCIUM) 500 MG chewable tablet Chew 1-3 tablets by mouth 3 (three) times daily as needed for indigestion or heartburn.    [provider]  cholecalciferol (VITAMIN D) 1000 units tablet Take 3,000 Units by mouth daily.    [provider]  clonazePAM (KLONOPIN) 1 MG tablet Take 1 tablet (1 mg total) by mouth 2 (two) times daily as needed for anxiety. 08/05/17 09/08/18  Opalski, Deborah, DO  Cobalamine Combinations (K-48 + FOLIC ACID) 1856-314  MCG TBDP Take 1 tablet by mouth daily. 04/06/17   [provider]  docusate sodium (COLACE) 100 MG capsule Take 100 mg by mouth 4 (four) times daily as needed (for constipation).    [provider]  furosemide (LASIX) 20 MG tablet Take a 20 mg tablet daily or as directed 08/31/17   Minus Breeding, MD  gabapentin (NEURONTIN) 300 MG capsule Take 3 capsules (900 mg total) by mouth at bedtime. 05/03/17   Opalski, Neoma Laming, DO  Guaifenesin (MUCINEX MAXIMUM STRENGTH) 1200 MG TB12 Take 1 tablet by mouth 2 (two) times daily.    [provider]  HYDROcodone-acetaminophen (NORCO) 10-325 MG tablet Take 1 tablet by mouth every 6 (six) hours as needed (for pain).  04/29/16   [provider]  hydrocortisone cream 1 % Apply 1 application topically daily as needed for itching (for feet).    [provider]  levothyroxine (SYNTHROID, LEVOTHROID) 75 MCG tablet Take 75 mcg by mouth daily before breakfast.  04/13/15   [provider]  losartan (COZAAR) 50 MG tablet Take 1 tablet (50 mg total) by mouth daily. 08/31/17   Minus Breeding, MD  Magnesium 500 MG TABS Take 500 mg by mouth daily with breakfast.    [provider]  Melatonin 5 MG CAPS Take 1 capsule by mouth daily.    [provider]  metFORMIN (GLUCOPHAGE-XR) 500 MG 24 hr tablet  Take 1,000 mg by mouth at bedtime. 04/24/15   [provider]  metoprolol tartrate (LOPRESSOR) 100 MG tablet Take 1 tablet (100 mg total) by mouth 2 (two) times daily. 08/31/17   Minus Breeding, MD  nitroGLYCERIN (NITROSTAT) 0.4 MG SL tablet DISSOLVE 1 TABLET UNDER THE TONGUE EVERY 5 MINUTES FOR UP TO 3 DOSES AS NEEDED FOR CHEST PAIN. IF NO RELIEF AFTER 3 DOSES, CALL 911 OR GO TO 08/31/17   Minus Breeding, MD  omeprazole (PRILOSEC) 20 MG capsule TAKE 1 CAPSULE BY MOUTH DAILY WITH BREAKFAST. 10/19/17   Opalski, Neoma Laming, DO  Polyethyl Glycol-Propyl Glycol (SYSTANE OP) Apply to eye.    [provider]  potassium gluconate 595 (99 K) MG TABS tablet Take 595 mg by mouth daily with breakfast.     [provider]  Probiotic Product (FLORAJEN3 PO) Take 1 capsule by mouth daily with breakfast.    [provider]  venlafaxine XR (EFFEXOR-XR) 75 MG 24 hr capsule Take 225 mg by mouth at bedtime. 04/19/15   [provider]  Vitamin D, Ergocalciferol, (DRISDOL) 50000 units CAPS capsule Take 1 capsule (50,000 Units total) by mouth every 7 (seven) days. 05/03/17   Mellody Dance, DO    Physical Exam: Vitals:   11/06/2017 2229  BP: (!) 177/85  Pulse: 72  Resp: 18  Temp: 98.3 F (36.8 C)  TempSrc: Oral  SpO2: 98%     Constitutional: NAD, calm  Eyes: PERTLA, lids and conjunctivae normal ENMT: Mucous membranes are moist. Posterior pharynx clear of any exudate or lesions.   Neck: normal, supple, no masses, no thyromegaly Respiratory: clear to auscultation bilaterally, no wheezing, no crackles. Normal respiratory effort.  Cardiovascular: S1 & S2 heard, regular rate and rhythm. No extremity edema. No significant JVD. Abdomen: No distension, no tenderness, soft. Bowel sounds normal.  Musculoskeletal: no clubbing / cyanosis. Right hip and proximal thigh tenderness, neurovascularlly intact distally.   Skin: no significant rashes, lesions, ulcers. Warm, dry,  well-perfused. Neurologic: CN 2-12 grossly intact. Sensation intact. Strength 5/5 in all 4 limbs.  Psychiatric: Alert and oriented  x 3. Calm, cooperative.    Labs on Admission: I have personally reviewed following labs and imaging studies  CBC: No results for input(s): WBC, NEUTROABS, HGB, HCT, MCV, PLT in the last 168 hours. Basic Metabolic Panel: No results for input(s): NA, K, CL, CO2, GLUCOSE, BUN, CREATININE, CALCIUM, MG, PHOS in the last 168 hours. GFR: CrCl cannot be calculated (Patient's most recent lab result is older than the maximum 21 days allowed.). Liver Function Tests: No results for input(s): AST, ALT, ALKPHOS, BILITOT, PROT, ALBUMIN in the last 168 hours. No results for input(s): LIPASE, AMYLASE in the last 168 hours. No results for input(s): AMMONIA in the last 168 hours. Coagulation Profile: No results for input(s): INR, PROTIME in the last 168 hours. Cardiac Enzymes: No results for input(s): CKTOTAL, CKMB, CKMBINDEX, TROPONINI in the last 168 hours. BNP (last 3 results) No results for input(s): PROBNP in the last 8760 hours. HbA1C: No results for input(s): HGBA1C in the last 72 hours. CBG: No results for input(s): GLUCAP in the last 168 hours. Lipid Profile: No results for input(s): CHOL, HDL, LDLCALC, TRIG, CHOLHDL, LDLDIRECT in the last 72 hours. Thyroid Function Tests: No results for input(s): TSH, T4TOTAL, FREET4, T3FREE, THYROIDAB in the last 72 hours. Anemia Panel: No results for input(s): VITAMINB12, FOLATE, FERRITIN, TIBC, IRON, RETICCTPCT in the last 72 hours. Urine analysis:    Component Value Date/Time   COLORURINE YELLOW 08/14/2016 1104   APPEARANCEUR HAZY (A) 08/14/2016 1104   LABSPEC 1.039 (H) 08/14/2016 1104   PHURINE 6.0 08/14/2016 1104   GLUCOSEU NEGATIVE 08/14/2016 1104   HGBUR SMALL (A) 08/14/2016 1104   BILIRUBINUR NEGATIVE 08/14/2016 1104   [place] 08/14/2016 1104   PROTEINUR 30 (A) 08/14/2016 1104   UROBILINOGEN 0.2  08/24/2014 0847   NITRITE NEGATIVE 08/14/2016 1104   LEUKOCYTESUR LARGE (A) 08/14/2016 1104   Sepsis Labs: @LABRCNTIP (procalcitonin:4,lacticidven:4) )No results found for this or any previous visit (from the past 240 hour(s)).   Radiological Exams on Admission: No results found.  EKG: Independently reviewed. Sinus rhythm, 1st degree AV block (in paper chart, from outside ED).   Assessment/Plan  1. Right hip fracture  - Presents with severe right hip after a fall at home  - Radiographs reveal prior hemiarthroplasty with comminuted proximal femur fracture at level of distal femoral stem with moderate displacement and medial angulation  - Orthopedic surgery is consulting and much appreciated  - Based on the available data, [name] [name] presents an estimated 0.7% risk of perioperative MI or cardiac arrest per [place] Hake al  - No additional preoperative testing recommended  - Type and screen, and continue supportive care with gentle IVF hydration, prn analgesia    2. CAD  - Status-post CABG in 2001  - Continues to follow with cardiology and has not experienced any angina recently  - Hold ASA and ARB in preparation for surgery, continue beta-blocker as tolerated   3. Chronic diastolic CHF  - Appears compensated  - Given a liter of NS in ED - Continue a gentle IVF hydration while NPO, follow daily wt and I/O's, continue beta-blocker as tolerated, hold ARB prior to surgery   4. Anxiety  - Continue Effexor and Klonopin    5. Hypertension  - Hold losartan prior to surgery - Continue Lopressor as tolerated   6. Type II DM  - A1c was only 5.7% in March  - Managed with metformin at home, held on admission  - Check CBG's and use a low-intensity SSI as needed  while in hospital     DVT prophylaxis: SCD's  Code Status: Full  Family Communication: Discussed with patient  Consults called: Orthopedic surgery Admission status: Inpatient     Vianne Bulls, MD [facility] Pager  [phone]  If 7PM-7AM, please contact night-coverage [email or web address] Password [email or web address]  11/09/2017, 1:32 AM

## 2017-11-09 NOTE — Progress Notes (Signed)
Patient complaining ogf 10 plus pain in left hip,charge nurse got order from Dr Myna Hidalgo for dilaudid 1 mg iv. Patient states med does not work on his pain Dr Myna Hidalgo contacted for morphine order received for morphine 2-4 mg iv,4 mg given for pain rated as a 10 on pain scale patient is alert and oriented on administration.

## 2017-11-09 NOTE — Consult Note (Signed)
Reason for Consult:Periprosthetic fracture around right hemi-hip arthroplasty placed in January 2015 Referring Physician: Truddie Coco is an 72 y.o. male.  HPI: Status post-right hemi-hip arthroplasty by my partner, Dr. Dorna Leitz in January 2015.  Yesterday in [place].  He caught his foot on the porch twists his leg and sustained a comminuted periprosthetic right femur fracture.  Essentially, the femur was bivalved around the cemented stem and then has a spiral fracture extending down to the mid femur.  The previously placed stem is unstable.  He denied any other injuries.  He does however have significant and complicated past medical history I will increase the risk of any surgical intervention.  Specifically, when he comes out of anesthesia.  He becomes highly combative and attempts to get out of bed.  His wife reports this is occurred after all of his recent surgeries, specifically for diverticulosis and anal fistula.  The postoperative psychosis will last for about a week according to his wife.He is on long-term chronic pain medications, specifically 8 Norco tablets a day for severe arthritis in his back.  Prior to this injury.  They were actually considering hospice care.He also has a history of coronary artery bypass and congestive heart failure,And diabetes. BMI 33  Past Medical History:  Diagnosis Date  . Anal fistula   . Anemia   . Anxiety    panic attacks  . Arthritis   . CAD (coronary artery disease)    a. s/p CABG 2001; b. LHC (3/09):  S-RCA, L-LAD, S-OM1/dCFX all patent, EF 55-60%;  c. myoview (12/12):  no ischemia, EF 68%;  d. Dob Echo (1/14):  + ECG changes but normal echo => med Rx cont'd  . Cancer [facility])    pt denies  . CHF (congestive heart failure) (HCC)    EF 55%  . Complication of anesthesia    CONFUSION, psychosis after surgery for a week  . DDD (degenerative disc disease), lumbar    cervical thoracic and lumbar  . Diabetes mellitus without complication ([place])    . Diverticulitis   . GERD (gastroesophageal reflux disease)   . Gout   . History of pneumonia    septic pneumonia  . Hyperlipidemia   . Hypertension   . Hypothyroidism   . IBS (irritable bowel syndrome)   . Osteoporosis   . PTSD (post-traumatic stress disorder)   . Scoliosis   . Spinal stenosis     Past Surgical History:  Procedure Laterality Date  . APPENDECTOMY    . BLEPHAROPLASTY Bilateral   . CERVICAL FUSION     times 2  . CHOLECYSTECTOMY    . COLOSTOMY REVERSAL  11/05/2015  . COLOSTOMY TAKEDOWN N/A 11/05/2015   Procedure: COLOSTOMY TAKEDOWN;  Surgeon: Georganna Skeans, MD;  Location: [place];  Service: General;  Laterality: N/A;  . CORONARY ARTERY BYPASS GRAFT     2001 LIMA LAD, SVG OM/Circ dista, SVG PDA.  Cath 2009 with patent grafts  . HAND SURGERY Right   . HIP ARTHROPLASTY Right 03/01/2013   Procedure: ARTHROPLASTY BIPOLAR HIP;  Surgeon: Alta Corning, MD;  [facility];  Service: Orthopedics;  Laterality: Right;  . INCISIONAL HERNIA REPAIR N/A 11/05/2015   Procedure: REPAIR INCISIONAL HERNIA;  Surgeon: Georganna Skeans, MD;  Location: [place];  Service: General;  Laterality: N/A;  . INCISIONAL HERNIA REPAIR N/A 07/27/2016   Procedure: HERNIA REPAIR INCISIONAL;  Surgeon: Ralene Ok, MD;  Location: [place];  Service: General;  Laterality: N/A;  . INSERTION OF  MESH N/A 07/27/2016   Procedure: INSERTION OF MESH;  Surgeon: Ralene Ok, MD;  Location: [place];  Service: General;  Laterality: N/A;  . JOINT REPLACEMENT     Right knee  . KNEE ARTHROSCOPY Bilateral   . LAPAROTOMY N/A 07/27/2016   Procedure: EXPLORATORY LAPAROTOMY;  Surgeon: Ralene Ok, MD;  Location: [place];  Service: General;  Laterality: N/A;  . LYSIS OF ADHESION N/A 07/27/2016   Procedure: LYSIS OF ADHESION;  Surgeon: Ralene Ok, MD;  Location: [place];  Service: General;  Laterality: N/A;  . PARTIAL COLECTOMY N/A 04/26/2015   Procedure: EXPLORATORY LAPAROTOMY PARTIAL COLECTOMY WITH COLOSTOMY AND  HARTMANN'S;  Surgeon: Georganna Skeans, MD;  Location: [place];  Service: General;  Laterality: N/A;  . PLACEMENT OF SETON     seton stitch  . STERNAL WIRE REMOVAL    . TREATMENT FISTULA ANAL     times 3    Family History  Problem Relation Age of Onset  . Colon cancer Mother   . Heart disease Father   . Heart disease Sister   . Heart disease Brother   . Diabetes Sister   . Heart disease Brother   . Heart disease Brother     Social History:  reports that he quit smoking about 41 years ago. His smoking use included cigarettes. He has a 45.00 pack-year smoking history. He has never used smokeless tobacco. He reports that he does not drink alcohol or use drugs.  Allergies:  Allergies  Allergen Reactions  . Ciprofloxacin Hcl Other (See Comments)    Causes pain in tendons, feels like muscle spasms. BLACK BOX WARNING RE: POSSIBLE TENDON RUPTURE, MYALGIAS, and OTHERS  . Fentanyl Other (See Comments)    Pt can handle having Fentanyl under anesthesia - does not tolerate after surgery for post op pain - causes mood swings, confusion, and agitation    . Flomax [Tamsulosin Hcl] Other (See Comments)    DROPS BLOOD PRESSURE  . Ace Inhibitors Cough    Medications: I have reviewed the patient's current medications.  Results for orders placed or performed during the hospital encounter of 10/18/2017 (from the past 48 hour(s))  Glucose, capillary     Status: Abnormal   Collection Time: 11/09/17  1:53 AM  Result Value Ref Range   Glucose-Capillary 132 (H) 70 - 99 mg/dL  Type and screen [place]     Status: None   Collection Time: 11/09/17  3:55 AM  Result Value Ref Range   ABO/RH(D) O POS    Antibody Screen NEG    Sample Expiration      11/12/2017 Performed at [facility], [place] [address] [place], [place] [address]   CBC     Status: Abnormal   Collection Time: 11/09/17  3:55 AM  Result Value Ref Range   WBC 10.4 4.0 - 10.5 K/uL   RBC 4.14 (L) 4.22 - 5.81 MIL/uL    Hemoglobin 11.4 (L) 13.0 - 17.0 g/dL   HCT 36.8 (L) 39.0 - 52.0 %   MCV 88.9 78.0 - 100.0 fL   MCH 27.5 26.0 - 34.0 pg   MCHC 31.0 30.0 - 36.0 g/dL   RDW 13.8 11.5 - 15.5 %   Platelets 251 150 - 400 K/uL    Comment: Performed at [facility], [place] [address] [place], [place] [address]  Basic metabolic panel     Status: Abnormal   Collection Time: 11/09/17  3:55 AM  Result Value Ref Range  Sodium 135 135 - 145 mmol/L   Potassium 3.8 3.5 - 5.1 mmol/L   Chloride 98 98 - 111 mmol/L   CO2 29 22 - 32 mmol/L   Glucose, Bld 135 (H) 70 - 99 mg/dL   BUN 10 8 - 23 mg/dL   Creatinine, Ser 1.16 0.61 - 1.24 mg/dL   Calcium 8.6 (L) 8.9 - 10.3 mg/dL   GFR calc non Af Amer >60 >60 mL/min   GFR calc Af Amer >60 >60 mL/min    Comment: (NOTE) The eGFR has been calculated using the CKD EPI equation. This calculation has not been validated in all clinical situations. eGFR's persistently <60 mL/min signify possible Chronic Kidney Disease.    Anion gap 8 5 - 15    Comment: Performed at [place] [address], [place], [place] [address]  Surgical PCR screen     Status: None   Collection Time: 11/09/17  5:30 AM  Result Value Ref Range   MRSA, PCR NEGATIVE NEGATIVE   Staphylococcus aureus NEGATIVE NEGATIVE    Comment: (NOTE) The Xpert SA Assay (FDA approved for NASAL specimens in patients 68 years of age and older), is one component of a comprehensive surveillance program. It is not intended to diagnose infection nor to guide or monitor treatment. Performed at [facility], [place] [address] [place], [place] [address]   Glucose, capillary     Status: Abnormal   Collection Time: 11/09/17  6:14 AM  Result Value Ref Range   Glucose-Capillary 133 (H) 70 - 99 mg/dL    No results found.  ROS Blood pressure (Abnormal) 144/77, pulse 87, temperature 98.8 F (37.1 C), temperature source Oral, resp. rate 18, SpO2 91 %. Physical Exam: Patient is examined in his hospital bed  on 5 N.  Obvious swelling to the right thigh.  Neurovascular intact distally.  Any attempts at movement of the right lower extremity causes significant and severe pain.  His toes are pink and well-perfused.  His right total knee incision is well-healed, no effusion.  I could not test range of motion secondary to the discomfort above the total knee from the fracture.  I did review the images that he brought with him from [place] on CD and they show the fracture that has been described.   Assessment/Plan: 72 year old man with severe and significant comorbidities who sustained a comminuted right periprosthetic fracture around a cemented stem placed in 2015.  Plan: I had a long discussion with his wife who stated that they were considering hospice care.  Prior to this injury.  I did explain to her that without intervention.  He would be stuck in bed and that more likely than not he would pass within a few months, either from pneumonia, bedsores or blood clots.  She is going assess the situation with her husband in detail.  I  In the meantime, I will schedule him for open reduction internal fixation of the periprosthetic femur fracture,If the patient and his family elect surgery.  The risks and benefits were discussed with both the patient and his spouse.  His wife is a retired Marine scientist from the [employer] system. The will include revision of the arthroplasty with placement of a long 10 inch AML stem cables and possibly a [employer] NCB plate. It is highly probable he will require a blood transfusion at the time of surgery.  Hopefully, the surgery will be accomplished tomorrow afternoon 10/27/2017.  Kerin Salen 11/09/2017, 7:52 AM

## 2017-11-09 NOTE — Progress Notes (Signed)
Initial Nutrition Assessment  DOCUMENTATION CODES:   Obesity unspecified  INTERVENTION:    Boost Plus PO TID, each supplement provides 360 kcal and 14 gm protein  NUTRITION DIAGNOSIS:   Inadequate oral intake related to poor appetite(pain related to hip fracture) as evidenced by per patient/family report.  GOAL:   Patient will meet greater than or equal to 90% of their needs  MONITOR:   PO intake, Supplement acceptance  REASON FOR ASSESSMENT:   Consult Hip fracture protocol  ASSESSMENT:   72 yo male with PMH of HTN, GERD, CAD, HLD, CHF, DM, hypothyroidism, diverticulitis, IBS, PTSD who was admitted on 9/23 s/p mechanical fall with right hip fracture.   Plans for ORIF 9/25.  Patient's wife reports patient lives on oatmeal and sandwiches. He drinks chocolate Boost supplements sometimes as well. He has not enjoyed eating since he had surgery for diverticulitis with colostomy a few years ago. Colostomy has been reversed. He no longer likes some foods, including chicken.  Labs reviewed.  CBG's: 133-127-126 Medications reviewed and include B-complex vitamin with vitamin C, vitamin D, Novolog.  No significant weight changes noted on review of weight encounters for the past 7 months.  NUTRITION - FOCUSED PHYSICAL EXAM:    Most Recent Value  Orbital Region  No depletion  Upper Arm Region  No depletion  Thoracic and Lumbar Region  Unable to assess  Buccal Region  No depletion  Temple Region  No depletion  Clavicle Bone Region  No depletion  Clavicle and Acromion Bone Region  No depletion  Scapular Bone Region  Unable to assess  Dorsal Hand  No depletion  Patellar Region  No depletion  Anterior Thigh Region  No depletion  Posterior Calf Region  No depletion  Edema (RD Assessment)  None  Hair  Reviewed  Eyes  Reviewed  Mouth  Reviewed  Skin  Reviewed  Nails  Reviewed       Diet Order:   Diet Order            Diet heart healthy/carb modified Room service  appropriate? Yes; Fluid consistency: Thin  Diet effective now              EDUCATION NEEDS:   No education needs have been identified at this time  Skin:  Skin Assessment: Reviewed RN Assessment  Last BM:  9/23  Height:   Ht Readings from Last 1 Encounters:  11/05/2017 5\' 10"  (1.778 m)    Weight:   Wt Readings from Last 1 Encounters:  10/25/2017 104.3 kg    Ideal Body Weight:  75.5 kg  BMI:  Body mass index is 33 kg/m.  Estimated Nutritional Needs:   Kcal:  1950-2150  Protein:  115-125 gm  Fluid:  >/= 2 L    Molli Barrows, RD, LDN, [place] Pager [phone] After Hours Pager [phone]

## 2017-11-09 NOTE — Progress Notes (Deleted)
Patient arrived on unit via stretcher at 2230 states he is in extreme pain. Physician paged  That patient has arrived and for orders was unable to get in contact with doctor after 3 attempts.Patient irate states pain is unbearable. M.D paged agagin orders  Put in for dilaudid 1 mg iv,patient refused dilaudid states it does not help his pain M>D contactacted orders put in for morphine 2-4mg  every 4 hours 4mg  administered iv.

## 2017-11-09 NOTE — Consult Note (Signed)
Consultation Note Date: 11/09/2017   Patient Name: [name] [name]  DOB: 11/13/1945  MRN: [id number]  Age / Sex: 72 y.o., male  PCP: Mellody Dance, DO Referring Physician: Jonetta Osgood, MD  Reason for Consultation: Establishing goals of care  HPI/Patient Profile: 72 y.o. male  with past medical history of type 2 diabetes mellitus, hypertension, anxiety, coronary artery disease status post CABG, chronic diastolic CHF, and hypothyroidism admitted on 11/15/2017 with severe right hip pain and fracture after fall.   Clinical Assessment and Goals of Care: I met today with Shanon Brow and Sunday Spillers. Sunday Spillers is very concerned with his ability to tolerate surgery and even more concerned with his ability to recover from surgery and his QOL. She has been struggling to take care of him already at home and is fearful that she will be unable to care for him after this surgery. She worries because he has already had poor QOL and has been considering hospice care to focus on pain management and comfort.   At this point Danish adds in "I want the surgery; I want to live!" He is quite adamant that he has prayed and that he will make it through surgery and be able to return home. He struggles to imagine a situation where Sunday Spillers will be unable to care for him at home and continues to state that he can get a walker or crutches or an electric wheelchair. He believes that his faith will carry him through and his motivation to continue living for his children will get him where he needs to be. His mother died when he was 28 yo and he does not want to leave his children "as orphans."   We agreed at this stage that he wants to proceed with surgery, we will assist to make his recovery as successful and smooth as possible, and we will take one day at a time and discuss longer term plans as we get to them. Patient and wife clearly have differing  opinions on plan of care but Melvyn is clear today regarding his wishes. Unfortunately I fear his recover and expectations are overly optimistic at this time.   Palliative will continue to follow for GOC and symptom management post-op. Currently he has received good relief with hydrocodone + clonazepam together. Will need to make sure that he has adequate pain medication as well as anxiety medication post-op. May also consider Precedex as needed for post-op psychosis history.   Primary Decision Maker PATIENT    SUMMARY OF RECOMMENDATIONS   - [name] adamant to proceed with surgery (Even given his wife's reservations) - May need assistance with post-op symptom management - Will need ongoing [place] conversation dependent on progress post-op  Code Status/Advance Care Planning:  DNR   Symptom Management:   Pain is managed well with Vicodin + clonazepam and IV morphine prn per patient. May benefit from prn Roobaxin post-op as he is having some muscle spasms.   Palliative Prophylaxis:   Aspiration, Bowel Regimen, Delirium Protocol, Frequent Pain Assessment and Turn Reposition  Psycho-social/Spiritual:   Desire for further Chaplaincy support:yes  Additional Recommendations: Caregiving  Support/Resources  Prognosis:   Unable to determine  Discharge Planning: To Be Determined      Primary Diagnoses: Present on Admission: . CAD (coronary artery disease) . Type 2 diabetes mellitus with complication ([place]) . Hypertension . Closed right hip fracture, initial encounter ([place]) . Mood disorder ([place])- mixed PTSD and depression . Chronic diastolic CHF (congestive heart failure) ([place])   I have reviewed the medical record, interviewed the patient and family, and examined the patient. The following aspects are pertinent.  Past Medical History:  Diagnosis Date  . Anal fistula   . Anemia   . Anxiety    panic attacks  . Arthritis   . CAD (coronary artery disease)    a. s/p CABG 2001; b.  LHC (3/09):  S-RCA, L-LAD, S-OM1/dCFX all patent, EF 55-60%;  c. myoview (12/12):  no ischemia, EF 68%;  d. Dob Echo (1/14):  + ECG changes but normal echo => med Rx cont'd  . Cancer [facility])    pt denies  . CHF (congestive heart failure) ([facility])    EF 55%  . Complication of anesthesia    CONFUSION, psychosis after surgery for a week  . DDD (degenerative disc disease), lumbar    cervical thoracic and lumbar  . Diabetes mellitus without complication ([place])   . Diverticulitis   . GERD (gastroesophageal reflux disease)   . Gout   . History of pneumonia    septic pneumonia  . Hyperlipidemia   . Hypertension   . Hypothyroidism   . IBS (irritable bowel syndrome)   . Osteoporosis   . PTSD (post-traumatic stress disorder)   . Scoliosis   . Spinal stenosis    Social History   Socioeconomic History  . Marital status: Married    Spouse name: Not on file  . Number of children: Not on file  . Years of education: Not on file  . Highest education level: Not on file  Occupational History  . Not on file  Social Needs  . Financial resource strain: Not on file  . Food insecurity:    Worry: Not on file    Inability: Not on file  . Transportation needs:    Medical: Not on file    Non-medical: Not on file  Tobacco Use  . Smoking status: Former Smoker    Packs/day: 1.50    Years: 30.00    Pack years: 45.00    Types: Cigarettes    Last attempt to quit: 02/17/1976    Years since quitting: 41.7  . Smokeless tobacco: Never Used  Substance and Sexual Activity  . Alcohol use: No    Comment: over a year   . Drug use: No    Comment: pt denies  . Sexual activity: Not Currently    Birth control/protection: None  Lifestyle  . Physical activity:    Days per week: Not on file    Minutes per session: Not on file  . Stress: Not on file  Relationships  . Social connections:    Talks on phone: Not on file    Gets together: Not on file    Attends religious service: Not on file    Active member of  club or organization: Not on file    Attends meetings of clubs or organizations: Not on file    Relationship status: Not on file  Other Topics Concern  . Not on file  Social History Narrative  .  Not on file   Family History  Problem Relation Age of Onset  . Colon cancer Mother   . Heart disease Father   . Heart disease Sister   . Heart disease Brother   . Diabetes Sister   . Heart disease Brother   . Heart disease Brother    Scheduled Meds: . allopurinol  300 mg Oral QHS  . B-complex with vitamin C  1 tablet Oral Q breakfast  . cholecalciferol  3,000 Units Oral Daily  . [START ON 10/30/2017] clonazePAM  1 mg Oral Daily   And  . clonazePAM  2 mg Oral Q supper  . gabapentin  900 mg Oral QHS  . insulin aspart  0-9 Units Subcutaneous Q4H  . lactose free nutrition  237 mL Oral TID BM  . levothyroxine  75 mcg Oral QAC breakfast  . metoprolol tartrate  100 mg Oral BID  . mupirocin ointment  1 application Nasal BID  . pantoprazole  40 mg Oral Daily  . polyethylene glycol  17 g Oral Daily  . rOPINIRole  0.5 mg Oral QHS  . senna-docusate  2 tablet Oral QHS  . [START ON 10/24/2017] tranexamic acid (CYKLOKAPRON) topical -INTRAOP  2,000 mg Topical Once  . venlafaxine XR  225 mg Oral QHS   Continuous Infusions: . [START ON 11/02/2017] tranexamic acid     PRN Meds:.bisacodyl, diphenhydrAMINE, guaiFENesin, hydrALAZINE, HYDROcodone-acetaminophen, morphine injection, ondansetron (ZOFRAN) IV, polyvinyl alcohol Allergies  Allergen Reactions  . Ciprofloxacin Hcl Other (See Comments)    Causes pain in tendons, feels like muscle spasms. BLACK BOX WARNING RE: POSSIBLE TENDON RUPTURE, MYALGIAS, and OTHERS  . Fentanyl Other (See Comments)    Pt can handle having Fentanyl under anesthesia - does not tolerate after surgery for post op pain - causes mood swings, confusion, and agitation    . Flomax [Tamsulosin Hcl] Other (See Comments)    DROPS BLOOD PRESSURE  . Ace Inhibitors Cough   Review of  Systems  Constitutional: Positive for activity change.  Musculoskeletal: Positive for back pain.  Neurological: Positive for weakness.  Psychiatric/Behavioral: The patient is nervous/anxious.     Physical Exam  Constitutional: He is oriented to person, place, and time. He appears well-developed.  HENT:  Head: Normocephalic and atraumatic.  Cardiovascular: Normal rate.  Pulmonary/Chest: Effort normal. No accessory muscle usage. No tachypnea. No respiratory distress.  Abdominal: Soft. Normal appearance.  Neurological: He is alert and oriented to person, place, and time.  Nursing note and vitals reviewed.   Vital Signs: BP (!) 144/77 (BP Location: Right Arm)   Pulse 87   Temp 98.3 F (36.8 C)   Resp 18   Ht '5\' 10"'$  (1.778 m)   Wt 104.3 kg   SpO2 91%   BMI 33.00 kg/m  Pain Scale: 0-10 POSS *See Group Information*: 1-Acceptable,Awake and alert Pain Score: 4    SpO2: SpO2: 91 % O2 Device:SpO2: 91 % O2 Flow Rate: .   IO: Intake/output summary:   Intake/Output Summary (Last 24 hours) at 11/09/2017 1426 Last data filed at 11/09/2017 1121 Gross per 24 hour  Intake 651 ml  Output -  Net 651 ml    LBM: Last BM Date: 10/31/2017 Baseline Weight: Weight: 104.3 kg Most recent weight: Weight: 104.3 kg     Palliative Assessment/Data: 30%     Time In: 1330 Time Out: 1430 Time Total: 60 min Greater than 50%  of this time was spent counseling and coordinating care related to the above assessment and plan.  Signed by: Vinie Sill, NP Palliative Medicine Team Pager # [phone] (M-F 8a-5p) Team Phone # [phone] (Nights/Weekends)

## 2017-11-09 NOTE — Progress Notes (Addendum)
PROGRESS NOTE        PATIENT DETAILS Name: [name] [name] Age: 72 y.o. Sex: male Date of Birth: [birth year] Admit Date: 11/03/2017 Admitting Physician Vianne Bulls, MD JJH:[email or web address], Neoma Laming, DO  Brief Narrative: Patient is a 72 y.o. male with history of DM-2, hypertension, anxiety, chronic pain syndrome on narcotics at home, CAD status post CABG 4481, chronic diastolic heart failure, hypothyroidism presented with right hip fracture following a mechanical fall.  See below for further details  Subjective: Pain was well controlled this morning-but later on this morning it became severe-patient asking morphine dosage to be increased to 4 mg.  Spouse at bedside-expresses concerns regarding surgery about postop psychosis/delirium/aggressiveness that apparently has happened in the past.  Assessment/Plan: Right hip fracture: Following a mechanical fall, appreciate orthopedic evaluation.  At baseline he is just about ambulatory in the house, mostly walks around with a walker/cane.  He has chronic back pain, and chronic knee pain-and is maintained on narcotics at home.  Apparently family has thought about hospice/palliative care due to overall functional decline over the past few years.  Spouse also claims that patient has had issues with postop delirium/psychosis.  Family and patient have not yet decided if they want to pursue surgical correction of the right hip fracture at this point but has been tentatively scheduled for surgery tomorrow.  Patient does have history of CAD and is status post CABG in 2001-Per last cardiology note this appears to be stable-he has no anginal symptoms with activity that he does at home.  Given his overall clinical state-I suspect he will be at least of moderate risk for any proposed surgical procedure-given his issues with prior delirium-he will be at risk for that as well-especially since he had a significant narcotic needs due to narcotic tolerance.   These issues-including cardiac risks, neurological risk, aspiration etc were all discussed with the patient and his spouse at bedside and over the phone earlier this morning.  Explained that-proposed surgical procedure would greatly alleviate the extent of pain that he is having.  Spouse and patient are currently discussing whether or not to proceed with surgery.  He is a DNR-orders have been entered accordingly.  Since he continues to have significant amount of pain-we will increase morphine to 3 mg every 2 hours, restart his usual Norco-and see how he does-if he continues to have significant pain-it is probably best to put him on a PCA pump.  He has been started on a bowel regimen with MiraLAX and senna as well.  CAD status post CABG in 2001: No anginal symptoms with baseline activity.  Chronic diastolic heart failure: Euvolemic-follow weights/I&O's.  Anxiety/depression: Per spouse apparently takes Klonopin 3 times daily scheduled-obviously anxiety slightly worse due to acute illness/pain-we will change Klonopin to 3 times daily scheduled dosing-continue Effexor.  Hypertension: Continue metoprolol-relatively well controlled.  DM-2: Hold all oral hypoglycemics-continue SSI-follow CBGs.  Hypothyroidism: Continue levothyroxine  GERD: Continue PPI  Chronic back pain/knee pain: On narcotics at home-this is being resumed.  Palliative care: DNR in place (confirmed with spouse)-continues to have significant pain-family struggling with decisions-spouse claims that they have talked about palliative care in the past-but has been told by her relatives/friends that she is "letting him go".  After discussion with patient/family-it appears that he has declined significantly in terms of his overall function and barely just get worse around the  house, he has significant pain at baseline.  Suspect best to be evaluated by palliative care for optimal pain management-and also for goals of care discussion.   DVT  Prophylaxis: SCD's  Code Status: DNR  Family Communication: Spouse at bedside  Disposition Plan: Remain inpatient-probably will require SNF on discharge.  Requires several more days of hospitalization.  Antimicrobial agents: Anti-infectives (From admission, onward)   None      Procedures: None  CONSULTS:  orthopedic surgery  Time spent: 40 minutes-Greater than 50% of this time was spent in counseling, explanation of diagnosis, planning of further management, and coordination of care.  MEDICATIONS: Scheduled Meds: . allopurinol  300 mg Oral QHS  . B-complex with vitamin C  1 tablet Oral Q breakfast  . cholecalciferol  3,000 Units Oral Daily  . clonazePAM  1 mg Oral TID  . gabapentin  900 mg Oral QHS  . insulin aspart  0-9 Units Subcutaneous Q4H  . levothyroxine  75 mcg Oral QAC breakfast  . metoprolol tartrate  100 mg Oral BID  . mupirocin ointment  1 application Nasal BID  . pantoprazole  40 mg Oral Daily  . [START ON 10/24/2017] tranexamic acid (CYKLOKAPRON) topical -INTRAOP  2,000 mg Topical Once  . venlafaxine XR  225 mg Oral QHS   Continuous Infusions: . sodium chloride 70 mL/hr at 11/09/17 0203  . [START ON 10/17/2017] tranexamic acid     PRN Meds:.bisacodyl, diphenhydrAMINE, guaiFENesin, hydrALAZINE, HYDROcodone-acetaminophen, morphine injection, ondansetron (ZOFRAN) IV, polyethylene glycol, polyvinyl alcohol   PHYSICAL EXAM: Vital signs: Vitals:   10/19/2017 2229 11/09/17 0505  BP: (!) 177/85 (!) 144/77  Pulse: 72 87  Resp: 18 18  Temp: 98.3 F (36.8 C) 98.8 F (37.1 C)  TempSrc: Oral Oral  SpO2: 98% 91%   There were no vitals filed for this visit. There is no height or weight on file to calculate BMI.   General appearance :Awake, alert, not in any distress. Speech Clear.  Eyes:, pupils equally reactive to light and accomodation,no scleral icterus. HEENT: Atraumatic and Normocephalic Neck: supple, no JVD. Resp:Good air entry bilaterally, no  added sounds  CVS: S1 S2 regular, no murmurs.  GI: Bowel sounds present, Non tender and not distended with no gaurding, rigidity or rebound.No organomegaly Extremities: B/L Lower Ext shows no edema, both legs are warm to touch Neurology:  speech clear,Non focal, sensation is grossly intact. Musculoskeletal:No digital cyanosis Skin:No Rash, warm and dry Wounds:N/A  I have personally reviewed following labs and imaging studies  LABORATORY DATA: CBC: Recent Labs  Lab 11/09/17 0355  WBC 10.4  HGB 11.4*  HCT 36.8*  MCV 88.9  PLT 102    Basic Metabolic Panel: Recent Labs  Lab 11/09/17 0355  NA 135  K 3.8  CL 98  CO2 29  GLUCOSE 135*  BUN 10  CREATININE 1.16  CALCIUM 8.6*    GFR: CrCl cannot be calculated (Unknown ideal weight.).  Liver Function Tests: No results for input(s): AST, ALT, ALKPHOS, BILITOT, PROT, ALBUMIN in the last 168 hours. No results for input(s): LIPASE, AMYLASE in the last 168 hours. No results for input(s): AMMONIA in the last 168 hours.  Coagulation Profile: No results for input(s): INR, PROTIME in the last 168 hours.  Cardiac Enzymes: No results for input(s): CKTOTAL, CKMB, CKMBINDEX, TROPONINI in the last 168 hours.  BNP (last 3 results) No results for input(s): PROBNP in the last 8760 hours.  HbA1C: No results for input(s): HGBA1C in the last 72 hours.  CBG:  Recent Labs  Lab 11/09/17 0153 11/09/17 0614 11/09/17 0806  GLUCAP 132* 133* 127*    Lipid Profile: No results for input(s): CHOL, HDL, LDLCALC, TRIG, CHOLHDL, LDLDIRECT in the last 72 hours.  Thyroid Function Tests: No results for input(s): TSH, T4TOTAL, FREET4, T3FREE, THYROIDAB in the last 72 hours.  Anemia Panel: No results for input(s): VITAMINB12, FOLATE, FERRITIN, TIBC, IRON, RETICCTPCT in the last 72 hours.  Urine analysis:    Component Value Date/Time   COLORURINE YELLOW 08/14/2016 1104   APPEARANCEUR HAZY (A) 08/14/2016 1104   LABSPEC 1.039 (H) 08/14/2016  1104   PHURINE 6.0 08/14/2016 1104   GLUCOSEU NEGATIVE 08/14/2016 1104   HGBUR SMALL (A) 08/14/2016 1104   BILIRUBINUR NEGATIVE 08/14/2016 1104   KETONESUR NEGATIVE 08/14/2016 1104   PROTEINUR 30 (A) 08/14/2016 1104   UROBILINOGEN 0.2 08/24/2014 0847   NITRITE NEGATIVE 08/14/2016 1104   LEUKOCYTESUR LARGE (A) 08/14/2016 1104    Sepsis Labs: Lactic Acid, Venous    Component Value Date/Time   LATICACIDVEN 2.4 (HH) 04/26/2015 0013    MICROBIOLOGY: Recent Results (from the past 240 hour(s))  Surgical PCR screen     Status: None   Collection Time: 11/09/17  5:30 AM  Result Value Ref Range Status   MRSA, PCR NEGATIVE NEGATIVE Final   Staphylococcus aureus NEGATIVE NEGATIVE Final    Comment: (NOTE) The Xpert SA Assay (FDA approved for NASAL specimens in patients 74 years of age and older), is one component of a comprehensive surveillance program. It is not intended to diagnose infection nor to guide or monitor treatment. Performed at [facility], [place] [address], [place], [place] [address]     RADIOLOGY STUDIES/RESULTS: Dg Chest Port 1 View  Result Date: 11/09/2017 CLINICAL DATA:  Hip surgery.  Preoperative exam. EXAM: PORTABLE CHEST 1 VIEW COMPARISON:  CT 08/14/2016. Chest x-ray 08/12/2016. Chest x-ray 11/09/2015. FINDINGS: Prior CABG. Cardiomegaly with normal pulmonary vascularity. Lungs are clear. No pleural effusion no pneumothorax. Stable elevation left hemidiaphragm. IMPRESSION: 1.  Prior CABG.  Cardiomegaly with normal pulmonary vascularity. 2.  Stable elevation left hemidiaphragm. Electronically Signed   By: Marcello Moores  Register   On: 11/09/2017 10:32     LOS: 1 day   Oren Binet, MD  [facility]  If 7PM-7AM, please contact night-coverage  Please page via [email or web address]-Password [email or web address]-click on MD name and type text message  11/09/2017, 10:44 AM

## 2017-11-10 ENCOUNTER — Inpatient Hospital Stay (HOSPITAL_COMMUNITY): Payer: Medicare Other

## 2017-11-10 ENCOUNTER — Encounter (HOSPITAL_COMMUNITY): Admission: AD | Disposition: E | Payer: Self-pay | Source: Other Acute Inpatient Hospital | Attending: Internal Medicine

## 2017-11-10 ENCOUNTER — Inpatient Hospital Stay (HOSPITAL_COMMUNITY): Payer: Medicare Other | Admitting: Anesthesiology

## 2017-11-10 ENCOUNTER — Encounter (HOSPITAL_COMMUNITY): Payer: Self-pay

## 2017-11-10 DIAGNOSIS — S72001D Fracture of unspecified part of neck of right femur, subsequent encounter for closed fracture with routine healing: Secondary | ICD-10-CM

## 2017-11-10 DIAGNOSIS — Z7189 Other specified counseling: Secondary | ICD-10-CM

## 2017-11-10 HISTORY — PX: ORIF PERIPROSTHETIC FRACTURE: SHX5034

## 2017-11-10 LAB — POCT I-STAT 4, (NA,K, GLUC, HGB,HCT)
GLUCOSE: 143 mg/dL — AB (ref 70–99)
HCT: 32 % — ABNORMAL LOW (ref 39.0–52.0)
Hemoglobin: 10.9 g/dL — ABNORMAL LOW (ref 13.0–17.0)
Potassium: 4.4 mmol/L (ref 3.5–5.1)
Sodium: 138 mmol/L (ref 135–145)

## 2017-11-10 LAB — GLUCOSE, CAPILLARY
GLUCOSE-CAPILLARY: 125 mg/dL — AB (ref 70–99)
GLUCOSE-CAPILLARY: 143 mg/dL — AB (ref 70–99)
Glucose-Capillary: 108 mg/dL — ABNORMAL HIGH (ref 70–99)
Glucose-Capillary: 128 mg/dL — ABNORMAL HIGH (ref 70–99)
Glucose-Capillary: 146 mg/dL — ABNORMAL HIGH (ref 70–99)

## 2017-11-10 LAB — PREPARE RBC (CROSSMATCH)

## 2017-11-10 SURGERY — OPEN REDUCTION INTERNAL FIXATION (ORIF) PERIPROSTHETIC FRACTURE
Anesthesia: General | Laterality: Right

## 2017-11-10 MED ORDER — PHENYLEPHRINE 40 MCG/ML (10ML) SYRINGE FOR IV PUSH (FOR BLOOD PRESSURE SUPPORT)
PREFILLED_SYRINGE | INTRAVENOUS | Status: AC
  Filled 2017-11-10: qty 10

## 2017-11-10 MED ORDER — SODIUM CHLORIDE 0.9 % IJ SOLN
INTRAMUSCULAR | Status: DC | PRN
  Administered 2017-11-10: 50 mL

## 2017-11-10 MED ORDER — METOCLOPRAMIDE HCL 5 MG PO TABS: 5.0000 mg | ORAL_TABLET | Freq: Three times a day (TID) | ORAL | Status: DC | PRN

## 2017-11-10 MED ORDER — PHENOL 1.4 % MT LIQD: 1.0000 | OROMUCOSAL | Status: DC | PRN

## 2017-11-10 MED ORDER — HYDROMORPHONE HCL 1 MG/ML IJ SOLN
INTRAMUSCULAR | Status: AC
  Filled 2017-11-10: qty 1

## 2017-11-10 MED ORDER — ASPIRIN EC 81 MG PO TBEC
81.0000 mg | DELAYED_RELEASE_TABLET | Freq: Two times a day (BID) | ORAL | Status: DC
  Administered 2017-11-11 – 2017-11-14 (×7): 81 mg via ORAL
  Filled 2017-11-10 (×8): qty 1

## 2017-11-10 MED ORDER — HALOPERIDOL LACTATE 5 MG/ML IJ SOLN
1.0000 mg | Freq: Four times a day (QID) | INTRAMUSCULAR | Status: DC | PRN
  Administered 2017-11-11 – 2017-11-13 (×3): 1 mg via INTRAVENOUS
  Filled 2017-11-10 (×3): qty 1

## 2017-11-10 MED ORDER — FENTANYL CITRATE (PF) 250 MCG/5ML IJ SOLN
INTRAMUSCULAR | Status: AC
  Filled 2017-11-10: qty 5

## 2017-11-10 MED ORDER — BUPIVACAINE-EPINEPHRINE 0.25% -1:200000 IJ SOLN
INTRAMUSCULAR | Status: AC
  Filled 2017-11-10: qty 1

## 2017-11-10 MED ORDER — FENTANYL CITRATE (PF) 100 MCG/2ML IJ SOLN
INTRAMUSCULAR | Status: DC | PRN
  Administered 2017-11-10: 100 ug via INTRAVENOUS

## 2017-11-10 MED ORDER — PHENYLEPHRINE 40 MCG/ML (10ML) SYRINGE FOR IV PUSH (FOR BLOOD PRESSURE SUPPORT)
PREFILLED_SYRINGE | INTRAVENOUS | Status: DC | PRN
  Administered 2017-11-10 (×4): 80 ug via INTRAVENOUS

## 2017-11-10 MED ORDER — ONDANSETRON HCL 4 MG/2ML IJ SOLN
INTRAMUSCULAR | Status: AC
  Filled 2017-11-10: qty 2

## 2017-11-10 MED ORDER — CEFAZOLIN SODIUM-DEXTROSE 2-4 GM/100ML-% IV SOLN
2.0000 g | INTRAVENOUS | Status: AC
  Administered 2017-11-10 (×2): 2 g via INTRAVENOUS

## 2017-11-10 MED ORDER — EPHEDRINE SULFATE 50 MG/ML IJ SOLN
INTRAMUSCULAR | Status: DC | PRN
  Administered 2017-11-10 (×3): 10 mg via INTRAVENOUS

## 2017-11-10 MED ORDER — METOCLOPRAMIDE HCL 5 MG/ML IJ SOLN: 5.0000 mg | Freq: Three times a day (TID) | INTRAMUSCULAR | Status: DC | PRN

## 2017-11-10 MED ORDER — ACETAMINOPHEN 10 MG/ML IV SOLN
INTRAVENOUS | Status: AC
  Filled 2017-11-10: qty 100

## 2017-11-10 MED ORDER — PROPOFOL 10 MG/ML IV BOLUS
INTRAVENOUS | Status: AC
  Filled 2017-11-10: qty 20

## 2017-11-10 MED ORDER — MORPHINE SULFATE (PF) 2 MG/ML IV SOLN
0.5000 mg | INTRAVENOUS | Status: DC | PRN
  Administered 2017-11-11: 1 mg via INTRAVENOUS
  Administered 2017-11-11: 0.5 mg via INTRAVENOUS
  Administered 2017-11-12 – 2017-11-14 (×4): 1 mg via INTRAVENOUS
  Filled 2017-11-10 (×6): qty 1

## 2017-11-10 MED ORDER — KETAMINE HCL 10 MG/ML IJ SOLN
INTRAMUSCULAR | Status: DC | PRN
  Administered 2017-11-10 (×5): 10 mg via INTRAVENOUS

## 2017-11-10 MED ORDER — CEFAZOLIN SODIUM-DEXTROSE 2-4 GM/100ML-% IV SOLN
2.0000 g | Freq: Four times a day (QID) | INTRAVENOUS | Status: AC
  Administered 2017-11-11 (×2): 2 g via INTRAVENOUS
  Filled 2017-11-10 (×2): qty 100

## 2017-11-10 MED ORDER — DOCUSATE SODIUM 100 MG PO CAPS
100.0000 mg | ORAL_CAPSULE | Freq: Two times a day (BID) | ORAL | Status: DC
  Administered 2017-11-11 – 2017-11-13 (×4): 100 mg via ORAL
  Filled 2017-11-10 (×5): qty 1

## 2017-11-10 MED ORDER — ALBUMIN HUMAN 5 % IV SOLN
INTRAVENOUS | Status: DC | PRN
  Administered 2017-11-10 (×2): via INTRAVENOUS

## 2017-11-10 MED ORDER — HYDROMORPHONE HCL 1 MG/ML IJ SOLN
0.5000 mg | INTRAMUSCULAR | Status: DC | PRN
  Administered 2017-11-10: 0.5 mg via INTRAVENOUS

## 2017-11-10 MED ORDER — MIDAZOLAM HCL 2 MG/2ML IJ SOLN
INTRAMUSCULAR | Status: AC
  Administered 2017-11-10: 2 mg via INTRAVENOUS
  Filled 2017-11-10: qty 2

## 2017-11-10 MED ORDER — ACETAMINOPHEN 10 MG/ML IV SOLN
INTRAVENOUS | Status: DC | PRN
  Administered 2017-11-10: 1000 mg via INTRAVENOUS

## 2017-11-10 MED ORDER — HYDROMORPHONE HCL 1 MG/ML IJ SOLN
INTRAMUSCULAR | Status: AC
  Filled 2017-11-10: qty 0.5

## 2017-11-10 MED ORDER — KETAMINE HCL 50 MG/5ML IJ SOSY
PREFILLED_SYRINGE | INTRAMUSCULAR | Status: AC
  Filled 2017-11-10: qty 5

## 2017-11-10 MED ORDER — MENTHOL 3 MG MT LOZG: 1.0000 | LOZENGE | OROMUCOSAL | Status: DC | PRN

## 2017-11-10 MED ORDER — LACTATED RINGERS IV SOLN
INTRAVENOUS | Status: DC
  Administered 2017-11-10 (×4): via INTRAVENOUS

## 2017-11-10 MED ORDER — BUPIVACAINE LIPOSOME 1.3 % IJ SUSP
20.0000 mL | Freq: Once | INTRAMUSCULAR | Status: DC
  Filled 2017-11-10: qty 20

## 2017-11-10 MED ORDER — LIDOCAINE HCL (CARDIAC) PF 100 MG/5ML IV SOSY
PREFILLED_SYRINGE | INTRAVENOUS | Status: DC | PRN
  Administered 2017-11-10: 60 mg via INTRAVENOUS

## 2017-11-10 MED ORDER — SUGAMMADEX SODIUM 200 MG/2ML IV SOLN
INTRAVENOUS | Status: DC | PRN
  Administered 2017-11-10: 200 mg via INTRAVENOUS

## 2017-11-10 MED ORDER — DEXMEDETOMIDINE HCL 200 MCG/2ML IV SOLN
INTRAVENOUS | Status: DC | PRN
  Administered 2017-11-10: 5 ug via INTRAVENOUS
  Administered 2017-11-10: 10 ug via INTRAVENOUS
  Administered 2017-11-10: 15 ug via INTRAVENOUS
  Administered 2017-11-10: 20 ug via INTRAVENOUS

## 2017-11-10 MED ORDER — ACETAMINOPHEN 325 MG PO TABS
325.0000 mg | ORAL_TABLET | Freq: Four times a day (QID) | ORAL | Status: DC | PRN
  Administered 2017-11-13: 650 mg via ORAL
  Filled 2017-11-10: qty 2

## 2017-11-10 MED ORDER — SODIUM CHLORIDE 0.9% IV SOLUTION: Freq: Once | INTRAVENOUS | Status: DC

## 2017-11-10 MED ORDER — MIDAZOLAM HCL 2 MG/2ML IJ SOLN
2.0000 mg | Freq: Once | INTRAMUSCULAR | Status: AC
  Administered 2017-11-10: 2 mg via INTRAVENOUS

## 2017-11-10 MED ORDER — ROCURONIUM BROMIDE 10 MG/ML (PF) SYRINGE
PREFILLED_SYRINGE | INTRAVENOUS | Status: DC | PRN
  Administered 2017-11-10: 50 mg via INTRAVENOUS
  Administered 2017-11-10: 30 mg via INTRAVENOUS
  Administered 2017-11-10: 20 mg via INTRAVENOUS
  Administered 2017-11-10: 50 mg via INTRAVENOUS

## 2017-11-10 MED ORDER — ROCURONIUM BROMIDE 50 MG/5ML IV SOSY
PREFILLED_SYRINGE | INTRAVENOUS | Status: AC
  Filled 2017-11-10: qty 15

## 2017-11-10 MED ORDER — BUPIVACAINE-EPINEPHRINE (PF) 0.25% -1:200000 IJ SOLN
INTRAMUSCULAR | Status: DC | PRN
  Administered 2017-11-10: 50 mL

## 2017-11-10 MED ORDER — HYDROMORPHONE HCL 1 MG/ML IJ SOLN: 0.2500 mg | INTRAMUSCULAR | Status: DC | PRN

## 2017-11-10 MED ORDER — SODIUM CHLORIDE 0.9 % IV SOLN
INTRAVENOUS | Status: DC | PRN
  Administered 2017-11-10: 30 ug/min via INTRAVENOUS

## 2017-11-10 MED ORDER — DEXMEDETOMIDINE HCL IN NACL 200 MCG/50ML IV SOLN
INTRAVENOUS | Status: AC
  Filled 2017-11-10: qty 50

## 2017-11-10 MED ORDER — 0.9 % SODIUM CHLORIDE (POUR BTL) OPTIME
TOPICAL | Status: DC | PRN
  Administered 2017-11-10 (×3): 1000 mL

## 2017-11-10 MED ORDER — HYDROMORPHONE HCL 1 MG/ML IJ SOLN
INTRAMUSCULAR | Status: DC | PRN
  Administered 2017-11-10: 0.5 mg via INTRAVENOUS

## 2017-11-10 MED ORDER — ACETAMINOPHEN 500 MG PO TABS
500.0000 mg | ORAL_TABLET | Freq: Four times a day (QID) | ORAL | Status: AC
  Administered 2017-11-11 (×4): 500 mg via ORAL
  Filled 2017-11-10 (×2): qty 1

## 2017-11-10 MED ORDER — PROPOFOL 10 MG/ML IV BOLUS
INTRAVENOUS | Status: DC | PRN
  Administered 2017-11-10: 200 mg via INTRAVENOUS

## 2017-11-10 MED ORDER — MIDAZOLAM HCL 2 MG/2ML IJ SOLN
INTRAMUSCULAR | Status: AC
  Filled 2017-11-10: qty 2

## 2017-11-10 MED ORDER — TRANEXAMIC ACID 1000 MG/10ML IV SOLN
INTRAVENOUS | Status: DC | PRN
  Administered 2017-11-10: 2000 mg via TOPICAL

## 2017-11-10 MED ORDER — MIDAZOLAM HCL 5 MG/5ML IJ SOLN
INTRAMUSCULAR | Status: DC | PRN
  Administered 2017-11-10: 2 mg via INTRAVENOUS

## 2017-11-10 MED ORDER — BUPIVACAINE LIPOSOME 1.3 % IJ SUSP
INTRAMUSCULAR | Status: DC | PRN
  Administered 2017-11-10: 20 mL

## 2017-11-10 MED ORDER — HYDROCODONE-ACETAMINOPHEN 5-325 MG PO TABS
1.0000 | ORAL_TABLET | ORAL | Status: DC | PRN
  Administered 2017-11-11: 1 via ORAL
  Filled 2017-11-10 (×2): qty 1

## 2017-11-10 MED ORDER — KETOROLAC TROMETHAMINE 30 MG/ML IJ SOLN
INTRAMUSCULAR | Status: AC
  Filled 2017-11-10: qty 1

## 2017-11-10 MED ORDER — KCL IN DEXTROSE-NACL 20-5-0.45 MEQ/L-%-% IV SOLN
INTRAVENOUS | Status: DC
  Administered 2017-11-11: 02:00:00 via INTRAVENOUS
  Filled 2017-11-10: qty 1000

## 2017-11-10 MED ORDER — CEFAZOLIN SODIUM 1 G IJ SOLR
INTRAMUSCULAR | Status: AC
  Filled 2017-11-10: qty 20

## 2017-11-10 SURGICAL SUPPLY — 74 items
BAG DECANTER FOR FLEXI CONT (MISCELLANEOUS) ×3 IMPLANT
BIT DRILL 4.3 (BIT) IMPLANT
BLADE LONG MED 31X9 (MISCELLANEOUS) ×1 IMPLANT
BRUSH FEMORAL CANAL (MISCELLANEOUS) IMPLANT
BUR CUTTER 7.0 ROUND (BURR) ×2 IMPLANT
BUR EGG ELITE 5.0 (BURR) ×1 IMPLANT
BUR ROUND FLUTED 5 RND (BURR) ×1 IMPLANT
CABLE CERLAGE W/CRIMP 1.8 (Cable) ×8 IMPLANT
CAP LOCK NCB (Cap) ×6 IMPLANT
COVER BACK TABLE 24X17X13 BIG (DRAPES) IMPLANT
COVER SURGICAL LIGHT HANDLE (MISCELLANEOUS) ×1 IMPLANT
DRAPE IMP U-DRAPE 54X76 (DRAPES) ×2 IMPLANT
DRAPE INCISE IOBAN 66X45 STRL (DRAPES) ×2 IMPLANT
DRAPE ORTHO SPLIT 77X108 STRL (DRAPES) ×4
DRAPE POUCH INSTRU U-SHP 10X18 (DRAPES) ×2 IMPLANT
DRAPE SURG ORHT 6 SPLT 77X108 (DRAPES) ×2 IMPLANT
DRILL BIT 4.3 (BIT) ×2
DRSG AQUACEL AG ADV 3.5X14 (GAUZE/BANDAGES/DRESSINGS) ×2 IMPLANT
DRSG PAD ABDOMINAL 8X10 ST (GAUZE/BANDAGES/DRESSINGS) ×2 IMPLANT
DURAPREP 26ML APPLICATOR (WOUND CARE) ×2 IMPLANT
ELECT CAUTERY BLADE 6.4 (BLADE) ×3 IMPLANT
ELECT REM PT RETURN 9FT ADLT (ELECTROSURGICAL) ×2
ELECTRODE REM PT RTRN 9FT ADLT (ELECTROSURGICAL) ×1 IMPLANT
FILTER STRAW FLUID ASPIR (MISCELLANEOUS) ×2 IMPLANT
GAUZE SPONGE 4X4 12PLY STRL (GAUZE/BANDAGES/DRESSINGS) ×2 IMPLANT
GLOVE BIO SURGEON STRL SZ7.5 (GLOVE) ×4 IMPLANT
GLOVE BIO SURGEON STRL SZ8.5 (GLOVE) ×5 IMPLANT
GLOVE BIOGEL PI IND STRL 9 (GLOVE) ×1 IMPLANT
GLOVE BIOGEL PI INDICATOR 9 (GLOVE) ×1
GOWN STRL REUS W/ TWL LRG LVL3 (GOWN DISPOSABLE) ×3 IMPLANT
GOWN STRL REUS W/ TWL XL LVL3 (GOWN DISPOSABLE) ×1 IMPLANT
GOWN STRL REUS W/TWL LRG LVL3 (GOWN DISPOSABLE) ×6
GOWN STRL REUS W/TWL XL LVL3 (GOWN DISPOSABLE) ×2
HANDPIECE INTERPULSE COAX TIP (DISPOSABLE) ×2
IMMOBILIZER KNEE 22 UNIV (SOFTGOODS) ×2 IMPLANT
IV NS IRRIG 3000ML ARTHROMATIC (IV SOLUTION) ×1 IMPLANT
KIT BASIN OR (CUSTOM PROCEDURE TRAY) ×2 IMPLANT
KIT TURNOVER KIT B (KITS) ×2 IMPLANT
MANIFOLD NEPTUNE II (INSTRUMENTS) ×2 IMPLANT
NEEDLE 22X1 1/2 (OR ONLY) (NEEDLE) ×3 IMPLANT
NS IRRIG 1000ML POUR BTL (IV SOLUTION) ×7 IMPLANT
PACK TOTAL JOINT (CUSTOM PROCEDURE TRAY) ×2 IMPLANT
PACK UNIVERSAL I (CUSTOM PROCEDURE TRAY) ×2 IMPLANT
PAD ARMBOARD 7.5X6 YLW CONV (MISCELLANEOUS) ×4 IMPLANT
PLATE DISTAL FEMUR 15H 317M RT (Plate) ×1 IMPLANT
SCREW 5.0 80MM (Screw) ×1 IMPLANT
SCREW CORTICAL NCB 5.0X44 (Screw) ×1 IMPLANT
SCREW CORTICAL NCB 5.0X90MM (Screw) ×2 IMPLANT
SCREW NCB 5.0X55MM (Screw) ×1 IMPLANT
SCREW UNI CORTICAL 5.0X14MM (Screw) ×2 IMPLANT
SCREW UNICORTICAL 5.0X14 (Screw) IMPLANT
SET HNDPC FAN SPRY TIP SCT (DISPOSABLE) IMPLANT
SPONGE LAP 18X18 X RAY DECT (DISPOSABLE) ×4 IMPLANT
SPONGE LAP 4X18 RFD (DISPOSABLE) ×2 IMPLANT
STAPLER VISISTAT 35W (STAPLE) ×1 IMPLANT
STEM FEM SS CMTL 16X10 LG RT (Stem) IMPLANT
STEM FEMORAL 18MM (Stem) ×1 IMPLANT
SUT ETHIBOND NAB CT1 #1 30IN (SUTURE) ×4 IMPLANT
SUT MNCRL AB 4-0 PS2 18 (SUTURE) ×2 IMPLANT
SUT VIC AB 0 CT1 27 (SUTURE) ×4
SUT VIC AB 0 CT1 27XBRD ANBCTR (SUTURE) IMPLANT
SUT VIC AB 1 CT1 27 (SUTURE) ×6
SUT VIC AB 1 CT1 27XBRD ANTBC (SUTURE) ×3 IMPLANT
SUT VIC AB 1 CTX 36 (SUTURE) ×4
SUT VIC AB 1 CTX36XBRD ANBCTR (SUTURE) IMPLANT
SUT VIC AB 2-0 CT1 27 (SUTURE) ×6
SUT VIC AB 2-0 CT1 TAPERPNT 27 (SUTURE) ×3 IMPLANT
SYR CONTROL 10ML LL (SYRINGE) ×2 IMPLANT
SYSTEM SOLUTION 10 INCH RT (Hips) ×1 IMPLANT
TOWEL OR 17X24 6PK STRL BLUE (TOWEL DISPOSABLE) ×2 IMPLANT
TOWEL OR 17X26 10 PK STRL BLUE (TOWEL DISPOSABLE) ×3 IMPLANT
TOWER CARTRIDGE SMART MIX (DISPOSABLE) IMPLANT
TRAY FOLEY W/BAG SLVR 14FR (SET/KITS/TRAYS/PACK) ×2 IMPLANT
WATER STERILE IRR 1000ML POUR (IV SOLUTION) ×4 IMPLANT

## 2017-11-10 NOTE — Plan of Care (Signed)
  Problem: Education: Goal: Knowledge of General Education information will improve Description Including pain rating scale, medication(s)/side effects and non-pharmacologic comfort measures Outcome: Progressing   

## 2017-11-10 NOTE — H&P (View-Only) (Signed)
PATIENT ID: [name] [name]  MRN: [id number]  DOB/AGE:  1945-02-27 / 72 y.o.    Procedure(s) (LRB): OPEN REDUCTION INTERNAL FIXATION (ORIF) PERIPROSTHETIC FRACTURE (Right)    PROGRESS NOTE Subjective:   Patient is confused to the point where he ripped out his catheter and IV overnight, no Nausea, no Vomiting. Pt NPO at this time. Denies SOB, Chest or Calf Pain. Using Incentive Spirometer, PAS in place. Non weight bearing with pt in traction   Objective: Vital signs in last 24 hours: Temp:  [98.3 F (36.8 C)-98.8 F (37.1 C)] 98.3 F (36.8 C) (09/25 0341) Pulse Rate:  [72-84] 74 (09/25 0341) Resp:  [14-18] 18 (09/25 0341) BP: (132-148)/(65-88) 148/88 (09/25 0341) SpO2:  [94 %-95 %] 94 % (09/25 0341)    Intake/Output from previous day: I/O last 3 completed shifts: In: 651 [I.V.:651] Out: 2700 [Urine:2700]   Intake/Output this shift: No intake/output data recorded.   LABORATORY DATA: Recent Labs    11/09/17 0355  11/09/17 2212 11/09/2017 0041 11/14/2017 0350  WBC 10.4  --   --   --   --   HGB 11.4*  --   --   --   --   HCT 36.8*  --   --   --   --   PLT 251  --   --   --   --   NA 135  --   --   --   --   K 3.8  --   --   --   --   CL 98  --   --   --   --   CO2 29  --   --   --   --   BUN 10  --   --   --   --   CREATININE 1.16  --   --   --   --   GLUCOSE 135*  --   --   --   --   GLUCAP  --    < > 151* 108* 125*  CALCIUM 8.6*  --   --   --   --    < > = values in this interval not displayed.    Examination: Neurologically intact Neurovascular intact Sensation intact distally Intact pulses distally pt in traction}  Assessment:     Procedure(s) (LRB): OPEN REDUCTION INTERNAL FIXATION (ORIF) PERIPROSTHETIC FRACTURE (Right) ADDITIONAL DIAGNOSIS:  Diabetes, Hypertension and CAD s/p CABG, CHF, anxiety, depression  Plan: Maintain NPO status  Scheduled  for open reduction internal fixation of the periprosthetic femur fracture,If the patient and his family elect  surgery.  The risks and benefits were discussed with both the patient and his spouse.  His wife is a retired Marine scientist from the [employer] system. The will include revision of the arthroplasty with placement of a long 10 inch AML stem cables and possibly a Zimmer/Biomet NCB plate. It is highly probable he will require a blood transfusion at the time of surgery.  Hopefully, the surgery will be accomplished this afternoon 11/08/2017.   We appreciate continued medical management of the pt's other medical issues.  Joanell Rising 11/14/2017, 7:59 AM

## 2017-11-10 NOTE — Progress Notes (Signed)
Patient's blood sugar 58 given orange juice value improved to 151 no further action taken.

## 2017-11-10 NOTE — Anesthesia Procedure Notes (Signed)
Procedure Name: Intubation Date/Time: 11/03/2017 3:57 PM Performed by: Purvis Kilts, CRNA Pre-anesthesia Checklist: Patient identified, Emergency Drugs available, Suction available, Patient being monitored and Timeout performed Patient Re-evaluated:Patient Re-evaluated prior to induction Oxygen Delivery Method: Circle system utilized Preoxygenation: Pre-oxygenation with 100% oxygen Induction Type: IV induction Ventilation: Mask ventilation without difficulty Laryngoscope Size: Mac and 3 Grade View: Grade I Tube type: Oral Tube size: 7.5 mm Number of attempts: 1 Airway Equipment and Method: Stylet Placement Confirmation: ETT inserted through vocal cords under direct vision,  positive ETCO2 and breath sounds checked- equal and bilateral Secured at: 21 cm Tube secured with: Tape Dental Injury: Teeth and Oropharynx as per pre-operative assessment

## 2017-11-10 NOTE — Progress Notes (Signed)
[facility] PROGRESS NOTE    Progress Note  [name] [name]  VFI:[id number] DOB: [birth year] DOA: 11/07/2017 PCP: Mellody Dance, DO     Brief Narrative:   [name] [name] is an 72 y.o. male past medical history of diabetes mellitus type 2, essential hypertension anxiety chronic narcotic use, CAD status post CABG in 4166, chronic diastolic heart failure hypothyroidism presents with right hip fracture following a mechanical fall.  Assessment/Plan:   Closed right hip fracture, initial encounter [facility]): Due to mechanical fall. Orthopedic surgery was consulted who recommended surgical intervention on 10/25/2017. He has moderate risk for cardiopulmonary complications. Family undecided whether to proceed with surgical intervention. Essential hypertension  CAD S/p CABG: No anginal symptoms.  Chronic diastolic heart failure: Seems to be euvolemic.  Anxiety and depression: Continue Klonopin  Essential Hypertension: Continue metoprolol relatively well controlled.  Mood disorder ([place])- mixed PTSD and depression  Type 2 diabetes mellitus with complication [facility]): Hold oral hypoglycemic agents continue sliding scale insulin. Chronic diastolic CHF (congestive heart failure) (HCC)   DVT prophylaxis: lovenox Family Communication:none Disposition Plan/Barrier to D/C: home in 3 days Code Status:     Code Status Orders  (From admission, onward)         Start     Ordered   11/09/17 1029  Do not attempt resuscitation (DNR)  Continuous    Question Answer Comment  In the event of cardiac or respiratory ARREST Do not call a "code blue"   In the event of cardiac or respiratory ARREST Do not perform Intubation, CPR, defibrillation or ACLS   In the event of cardiac or respiratory ARREST Use medication by any route, position, wound care, and other measures to relive pain and suffering. May use oxygen, suction and manual treatment of airway obstruction as needed for comfort.      11/09/17 1030        Code Status History    Date Active Date Inactive Code Status Order ID Comments User Context   11/09/2017 0110 11/09/2017 1030 Full Code [id number]  Vianne Bulls, MD Inpatient   07/27/2016 1240 08/01/2016 1902 Full Code [id number]  Ralene Ok, MD Inpatient   11/05/2015 1426 11/12/2015 1359 Full Code [id number]  Georganna Skeans, MD Inpatient   04/26/2015 0205 05/06/2015 1501 Full Code [id number]  Gabriel Rainwater, RN Inpatient   08/24/2014 0922 08/27/2014 1714 Full Code [id number]  Caren Griffins, MD ED   01/01/2014 1216 01/01/2014 1806 Full Code [id number]  Linus Mako, PA-C ED   03/01/2013 1745 03/03/2013 1809 Full Code [id number]  Erlene Senters, PA-C Inpatient   02/28/2013 2255 03/01/2013 1745 Full Code [id number]  Annita Brod, MD Inpatient    Advance Directive Documentation     Most Recent Value  Type of Advance Directive  Healthcare Power of [place], Living will  Pre-existing out of facility DNR order (yellow form or pink MOST form)  -  "MOST" Form in Place?  -        IV Access:    Peripheral IV   Procedures and diagnostic studies:   Ct Femur Right Wo Contrast  Result Date: 11/09/2017 CLINICAL DATA:  Status post fall.  Right hip and buttock pain. EXAM: CT OF THE LOWER RIGHT EXTREMITY WITHOUT CONTRAST TECHNIQUE: Multidetector CT imaging of the right lower extremity was performed according to the standard protocol. COMPARISON:  None. FINDINGS: Bones/Joint/Cartilage Right total hip arthroplasty. Vertically oriented fracture involving the entire length of the femoral stem component from the greater trochanter to the  tip of the arthroplasty with the 2 major proximal fracture fragments splayed 5 cm on either side of the femoral shaft distal to the arthroplasty. Femoral stem component is displaced laterally relative to the distal shaft. No hip dislocation. No other fracture or dislocation. Degenerative changes of the pubic symphysis. No aggressive osseous lesion.  Ligaments Suboptimally assessed by CT. Muscles and Tendons Mild atrophy of the semimembranosus muscle. Remainder the muscles are normal. Soft tissues No fluid collection or hematoma. Mild soft tissue edema along the lateral aspect of the hip. IMPRESSION: 1. Right total hip arthroplasty. Vertically oriented fracture involving the entire length of the femoral stem component from the greater trochanter to the tip of the arthroplasty with the 2 major proximal fracture fragments splayed 5 cm on either side of the femoral shaft distal to the arthroplasty. Femoral stem component is displaced laterally relative to the distal shaft. Electronically Signed   By: Kathreen Devoid   On: 11/09/2017 13:56   Dg Chest Port 1 View  Result Date: 11/09/2017 CLINICAL DATA:  Hip surgery.  Preoperative exam. EXAM: PORTABLE CHEST 1 VIEW COMPARISON:  CT 08/14/2016. Chest x-ray 08/12/2016. Chest x-ray 11/09/2015. FINDINGS: Prior CABG. Cardiomegaly with normal pulmonary vascularity. Lungs are clear. No pleural effusion no pneumothorax. Stable elevation left hemidiaphragm. IMPRESSION: 1.  Prior CABG.  Cardiomegaly with normal pulmonary vascularity. 2.  Stable elevation left hemidiaphragm. Electronically Signed   By: Marcello Moores  Register   On: 11/09/2017 10:32     Medical Consultants:    None.  Anti-Infectives:   None  Subjective:    [name] [name] he relates his pain is controlled.  Objective:    Vitals:   11/09/17 1146 11/09/17 1453 11/09/17 2038 10/22/2017 0341  BP:  (!) 146/77 132/65 (!) 148/88  Pulse:  72 84 74  Resp:  14 18 18   Temp: 98.3 F (36.8 C) 98.4 F (36.9 C) 98.8 F (37.1 C) 98.3 F (36.8 C)  TempSrc:  Oral Oral Oral  SpO2:  95% 94% 94%  Weight:      Height:        Intake/Output Summary (Last 24 hours) at 10/24/2017 0831 Last data filed at 10/23/2017 0340 Gross per 24 hour  Intake 651 ml  Output 2700 ml  Net -2049 ml   Filed Weights   10/28/2017 2229  Weight: 104.3 kg    Exam: General exam:  In no acute distress. Respiratory system: Good air movement and clear to auscultation. Cardiovascular system: S1 & S2 heard, RRR.  Gastrointestinal system: Abdomen is nondistended, soft and nontender.  Central nervous system: Alert and oriented. No focal neurological deficits. Extremities: No pedal edema, with a retractor in place. Skin: No rashes, lesions or ulcers Psychiatry: Judgement and insight appear normal. Mood & affect appropriate.    Data Reviewed:    Labs: Basic Metabolic Panel: Recent Labs  Lab 11/09/17 0355  NA 135  K 3.8  CL 98  CO2 29  GLUCOSE 135*  BUN 10  CREATININE 1.16  CALCIUM 8.6*   GFR Estimated Creatinine Clearance: 69.6 mL/min (by C-G formula based on SCr of 1.16 mg/dL). Liver Function Tests: No results for input(s): AST, ALT, ALKPHOS, BILITOT, PROT, ALBUMIN in the last 168 hours. No results for input(s): LIPASE, AMYLASE in the last 168 hours. No results for input(s): AMMONIA in the last 168 hours. Coagulation profile No results for input(s): INR, PROTIME in the last 168 hours.  CBC: Recent Labs  Lab 11/09/17 0355  WBC 10.4  HGB 11.4*  HCT  36.8*  MCV 88.9  PLT 251   Cardiac Enzymes: No results for input(s): CKTOTAL, CKMB, CKMBINDEX, TROPONINI in the last 168 hours. BNP (last 3 results) No results for input(s): PROBNP in the last 8760 hours. CBG: Recent Labs  Lab 11/09/17 2040 11/09/17 2137 11/09/17 2212 11/14/2017 0041 10/17/2017 0350  GLUCAP 186* 54* 151* 108* 125*   D-Dimer: No results for input(s): DDIMER in the last 72 hours. Hgb A1c: No results for input(s): HGBA1C in the last 72 hours. Lipid Profile: No results for input(s): CHOL, HDL, LDLCALC, TRIG, CHOLHDL, LDLDIRECT in the last 72 hours. Thyroid function studies: No results for input(s): TSH, T4TOTAL, T3FREE, THYROIDAB in the last 72 hours.  Invalid input(s): FREET3 Anemia work up: No results for input(s): VITAMINB12, FOLATE, FERRITIN, TIBC, IRON, RETICCTPCT in the  last 72 hours. Sepsis Labs: Recent Labs  Lab 11/09/17 0355  WBC 10.4   Microbiology Recent Results (from the past 240 hour(s))  Surgical PCR screen     Status: None   Collection Time: 11/09/17  5:30 AM  Result Value Ref Range Status   MRSA, PCR NEGATIVE NEGATIVE Final   Staphylococcus aureus NEGATIVE NEGATIVE Final    Comment: (NOTE) The Xpert SA Assay (FDA approved for NASAL specimens in patients 62 years of age and older), is one component of a comprehensive surveillance program. It is not intended to diagnose infection nor to guide or monitor treatment. Performed at [facility], [place] [address] [place],  [address]      Medications:   . allopurinol  300 mg Oral QHS  . B-complex with vitamin C  1 tablet Oral Q breakfast  . cholecalciferol  3,000 Units Oral Daily  . clonazePAM  1 mg Oral Daily   And  . clonazePAM  2 mg Oral Q supper  . gabapentin  900 mg Oral QHS  . insulin aspart  0-9 Units Subcutaneous Q4H  . lactose free nutrition  237 mL Oral TID BM  . levothyroxine  75 mcg Oral QAC breakfast  . metoprolol tartrate  100 mg Oral BID  . mupirocin ointment  1 application Nasal BID  . pantoprazole  40 mg Oral Daily  . polyethylene glycol  17 g Oral Daily  . povidone-iodine  2 application Topical Once  . rOPINIRole  0.5 mg Oral QHS  . senna-docusate  2 tablet Oral QHS  . tranexamic acid (CYKLOKAPRON) topical -INTRAOP  2,000 mg Topical Once  . venlafaxine XR  225 mg Oral QHS   Continuous Infusions: . tranexamic acid         LOS: 2 days   Charlynne Cousins  [facility] Pager [phone]  *Please refer to [email or web address], password [email or web address] to get updated schedule on who will round on this patient, as hospitalists switch teams weekly. If 7PM-7AM, please contact night-coverage at [email or web address], password [email or web address] for any overnight needs.  10/28/2017, 8:31 AM

## 2017-11-10 NOTE — Interval H&P Note (Signed)
History and Physical Interval Note:  10/26/2017 2:26 PM  [name] [name]  has presented today for surgery, with the diagnosis of right peri prosthetic femur fracture  The various methods of treatment have been discussed with the patient and family. After consideration of risks, benefits and other options for treatment, the patient has consented to  Procedure(s): OPEN REDUCTION INTERNAL FIXATION (ORIF) PERIPROSTHETIC FRACTURE (Right) as a surgical intervention .  The patient's history has been reviewed, patient examined, no change in status, stable for surgery.  I have reviewed the patient's chart and labs.  Questions were answered to the patient's satisfaction.     Kerin Salen

## 2017-11-10 NOTE — Anesthesia Procedure Notes (Signed)
Performed by: Tysheena Ginzburg J, CRNA       

## 2017-11-10 NOTE — Anesthesia Postprocedure Evaluation (Signed)
Anesthesia Post Note  Patient: [name] SIEFKER  Procedure(s) Performed: OPEN REDUCTION INTERNAL FIXATION (ORIF) PERIPROSTHETIC FRACTURE (Right )     Patient location during evaluation: PACU Anesthesia Type: General Level of consciousness: awake Pain management: pain level controlled Vital Signs Assessment: post-procedure vital signs reviewed and stable Respiratory status: spontaneous breathing Cardiovascular status: stable Postop Assessment: no backache Anesthetic complications: no    Last Vitals:  Vitals:   11/13/2017 2225 11/09/2017 2242  BP: 131/65 (!) 141/71  Pulse: 89 89  Resp: 19   Temp:  36.4 C  SpO2: 97% 98%    Last Pain:  Vitals:   10/28/2017 2242  TempSrc: Oral  PainSc:                  Contrell Ballentine

## 2017-11-10 NOTE — Progress Notes (Signed)
Wife and son at bedside. Or tech present and received report to transport pt to OR.

## 2017-11-10 NOTE — Anesthesia Preprocedure Evaluation (Addendum)
Anesthesia Evaluation  Patient identified by MRN, date of birth, ID band Patient awake    Reviewed: Allergy & Precautions, NPO status , Patient's Chart, lab work & pertinent test results  Airway Mallampati: II  TM Distance: >3 FB Neck ROM: Full    Dental  (+) Missing   Pulmonary former smoker,    Pulmonary exam normal breath sounds clear to auscultation       Cardiovascular hypertension, Pt. on medications + CAD, + CABG (x 4 in 2001) and +CHF  Normal cardiovascular exam Rhythm:Regular Rate:Normal  ECG: Sinus rhythm with sinus arrhythmia with 1st degree A-V block. Rate 33  Sees cardiologist (Hochrein)   Neuro/Psych PSYCHIATRIC DISORDERS Anxiety PTSD (post-traumatic stress disorder)negative neurological ROS     GI/Hepatic Neg liver ROS, GERD  Medicated and Controlled,IBS (irritable bowel syndrome)   Endo/Other  diabetes, Oral Hypoglycemic AgentsHypothyroidism   Renal/GU negative Renal ROS     Musculoskeletal Scoliosis Gout Spinal stenosis   Abdominal (+) + obese,   Peds  Hematology  (+) anemia , HLD   Anesthesia Other Findings Right peri prosthetic femur fracture Ambulates with cane  Reproductive/Obstetrics                           Anesthesia Physical Anesthesia Plan  ASA: II  Anesthesia Plan: General   Post-op Pain Management:    Induction: Intravenous  PONV Risk Score and Plan: 2 and Ondansetron, Dexamethasone and Treatment may vary due to age or medical condition  Airway Management Planned: Oral ETT  Additional Equipment:   Intra-op Plan:   Post-operative Plan: Extubation in OR  Informed Consent: I have reviewed the patients History and Physical, chart, labs and discussed the procedure including the risks, benefits and alternatives for the proposed anesthesia with the patient or authorized representative who has indicated his/her understanding and acceptance.   Dental  advisory given  Plan Discussed with: CRNA  Anesthesia Plan Comments:         Anesthesia Quick Evaluation

## 2017-11-10 NOTE — Transfer of Care (Signed)
Immediate Anesthesia Transfer of Care Note  Patient: [name] [name]  Procedure(s) Performed: OPEN REDUCTION INTERNAL FIXATION (ORIF) PERIPROSTHETIC FRACTURE (Right )  Patient Location: PACU  Anesthesia Type:General  Level of Consciousness: awake  Airway & Oxygen Therapy: Patient Spontanous Breathing  Post-op Assessment: Report given to RN and Post -op Vital signs reviewed and stable  Post vital signs: Reviewed and stable  Last Vitals:  Vitals Value Taken Time  BP 101/79 10/28/2017  9:42 PM  Temp 36.4 C 10/18/2017  9:42 PM  Pulse 95 11/14/2017  9:52 PM  Resp 15 10/22/2017  9:52 PM  SpO2 98 % 10/23/2017  9:52 PM  Vitals shown include unvalidated device data.  Last Pain:  Vitals:   11/15/2017 1525  TempSrc:   PainSc: 4       Patients Stated Pain Goal: 3 (56/21/30 8657)  Complications: No apparent anesthesia complications

## 2017-11-10 NOTE — Progress Notes (Signed)
PATIENT ID: [name] [name]  MRN: [id number]  DOB/AGE:  [birth year] / 72 y.o.    Procedure(s) (LRB): OPEN REDUCTION INTERNAL FIXATION (ORIF) PERIPROSTHETIC FRACTURE (Right)    PROGRESS NOTE Subjective:   Patient is confused to the point where he ripped out his catheter and IV overnight, no Nausea, no Vomiting. Pt NPO at this time. Denies SOB, Chest or Calf Pain. Using Incentive Spirometer, PAS in place. Non weight bearing with pt in traction   Objective: Vital signs in last 24 hours: Temp:  [98.3 F (36.8 C)-98.8 F (37.1 C)] 98.3 F (36.8 C) (09/25 0341) Pulse Rate:  [72-84] 74 (09/25 0341) Resp:  [14-18] 18 (09/25 0341) BP: (132-148)/(65-88) 148/88 (09/25 0341) SpO2:  [94 %-95 %] 94 % (09/25 0341)    Intake/Output from previous day: I/O last 3 completed shifts: In: 651 [I.V.:651] Out: 2700 [Urine:2700]   Intake/Output this shift: No intake/output data recorded.   LABORATORY DATA: Recent Labs    11/09/17 0355  11/09/17 2212 11/09/2017 0041 11/12/2017 0350  WBC 10.4  --   --   --   --   HGB 11.4*  --   --   --   --   HCT 36.8*  --   --   --   --   PLT 251  --   --   --   --   NA 135  --   --   --   --   K 3.8  --   --   --   --   CL 98  --   --   --   --   CO2 29  --   --   --   --   BUN 10  --   --   --   --   CREATININE 1.16  --   --   --   --   GLUCOSE 135*  --   --   --   --   GLUCAP  --    < > 151* 108* 125*  CALCIUM 8.6*  --   --   --   --    < > = values in this interval not displayed.    Examination: Neurologically intact Neurovascular intact Sensation intact distally Intact pulses distally pt in traction}  Assessment:     Procedure(s) (LRB): OPEN REDUCTION INTERNAL FIXATION (ORIF) PERIPROSTHETIC FRACTURE (Right) ADDITIONAL DIAGNOSIS:  Diabetes, Hypertension and CAD s/p CABG, CHF, anxiety, depression  Plan: Maintain NPO status  Scheduled  for open reduction internal fixation of the periprosthetic femur fracture,If the patient and his family elect  surgery.  The risks and benefits were discussed with both the patient and his spouse.  His wife is a retired Marine scientist from the [employer] system. The will include revision of the arthroplasty with placement of a long 10 inch AML stem cables and possibly a Zimmer/Biomet NCB plate. It is highly probable he will require a blood transfusion at the time of surgery.  Hopefully, the surgery will be accomplished this afternoon 11/09/2017.   We appreciate continued medical management of the pt's other medical issues.  Joanell Rising 10/18/2017, 7:59 AM

## 2017-11-10 NOTE — Progress Notes (Addendum)
Palliative:  I met again today with Shanon Brow and Sunday Spillers. Lavan was apparently very confused last night and pulled out IV. Plan is to proceed with surgery today. Jejuan is sleeping peacefully this morning. Sunday Spillers is tearful and worried about him getting through surgery. She is worried about how he will do post-op. He has historically had severe psychosis requiring restraint for 4-5 days post-op. Will need ongoing palliative support for symptoms and support to see what options will be longer term depending on his recovery from surgery.   Post-op need to make sure he continues to have scheduled benzos (h/o PTSD and long term use of clonazepam), adequate pain medication (chronic opioid use with low back pain). With post-op psychosis may consider Precedex infusion or Zyprexa.  Exam: Sleeping, awakes to IV nurse and appears appropriate and not confused or combative. No distress.   25 min  Vinie Sill, NP Palliative Medicine Team Pager # [phone] (M-F 8a-5p) Team Phone # [phone] (Nights/Weekends)

## 2017-11-10 NOTE — Op Note (Signed)
Preop diagnosis: Periprosthetic fracture right hemi-hip arthroplasty extending down to the diaphysis of the femur unstable stem.  Postoperative diagnosis: Same  Procedure: Open reduction internal fixation of right midshaft femur periprosthetic fracture, revision of hemi-hip arthroplasty.  Implants included a 15 hole cable plate construct for the periprosthetic fracture extending down into the midshaft of the femur the hemiarthroplasty was revised with a 10 inch curved AML solution stem 18 mm in diameter with a -2 monopolar head 54 mm in diameter.  We also used multiple cerclage wires on the mid shaft fracture below the periprosthetic fracture. Surgeon: Kathalene Frames. Mayer Camel M.D.  Assistant: Kerry Hough. [employer]  (present throughout entire procedure and necessary for timely completion of the procedure)  Estimated blood loss: 1200 cc cc  Fluid replacement: 2 L of crystalloid cc of crystalloid  Complications: None  Indications: 72 year old man with multiple comorbidities including congestive heart failure diabetes obesity mild to moderate dementia and perioperative psychosis when he has had procedures in the past.  Slipped and fell 2 days ago and sustained a bad periprosthetic fracture that split the femur around the cemented stem and then extended down into the midshaft of the femur with a large butterfly fragments.  The the stem was felt to be grossly loose.  After extensive discussion with the family options including surgery and hospice were considered and the patient has elected to proceed with open reduction internal fixation of the femur fracture and revision of the hemi-hip arthroplasty.  There is significant perioperative risk the patient and the family are aware of this.  Those risks include infection loss of fixation blood clots cardiovascular problems and problems with anesthesia. n Risks and benefits of revision surgery have been discussed and questions answered.  Procedure: Patient was  identified by arm band receive preoperative IV antibiotics in the holding area at, and hospital. He was then taken to the operating room where the appropriate anesthetic monitors were attached and general endotracheal anesthesia induced with the patient in the supine position. He was then rolled into the left lateral decubitus position and fixed there with a mark 2 pelvic clamp. Time out procedure performed.  The right lower extremity was prepped and draped in usual sterile fashion from the ankle to the hemipelvis.  Timeout procedure was performed.  We began the operation by making a 25 cm incision re-creating the old posterior lateral approach to the hip.  We cut through the skin and subcutaneous tissue down to the IT band which was cut in line with the skin incision exposing the greater trochanter as well as an extensive amount of hematoma around the implant.  The implant was grossly unstable we exploited the interval where the femur had been split around the implant remove the loose cement and eventually remove the implant itself.  We then directed our attention to the distal fragment starting at the midshaft of the femur and placed a prophylactic cerclage wire around the proximal aspect of the distal femur.  We then sequentially reamed up to an 18 mm reamer in anticipation of using a 10 inch curved AML stem getting good cortical chatter throughout the reaming.  Using the long anterior butterfly fragment we estimated the length of the femur prior to the fracture and drove a 10 inch bowed by 18 mm AML stem to the appropriate depth, the stem was driven and 20 degrees of anteversion.  After driving the stem a -3N36 mm monopolar head was put on the stem of the hip reduced and found  to have good stability and good tension..  The anterior and posterior butterfly fragments were then extensively sculpted using a 7 mm bur so that they would wrap around to the bowed AML stem.  Once this had been accomplished cerclage wires  were used to fix the anterior and posterior butterfly fragments to the AML stem with good firm fixation.  Because of the patient's history of psychosis after his recent surgeries lasting up to a week we then extended the incision down to the epicondyle laterally of the knee and further dissected the vastus lateralis off the lateral aspect of the femur.  We then selected a 15 hole Zimmer distal NCB plate and applied it to the lateral femur provisionally fixated with a single cerclage wire C arm images were obtained showing good position of the plate which was then further fixed with 4 more proximal cerclage wires a unicortical screw over the tip of the AML stem and then 6 bicortical screws distally including a cluster of 3 just above his total knee.  Final C arm images were obtained.  The wound was thoroughly irrigated with pulse lavage the vastus lateralis was closed with running 0 Vicryl suture the IT band with running #1 Vicryl suture the subcutaneous tissue with 2-0 Vicryl suture and the skin with skin staples a dressing of Aquacil was then applied.  Patient was rolled supine awaken extubated and taken to the recovery room without difficulty.

## 2017-11-11 ENCOUNTER — Inpatient Hospital Stay (HOSPITAL_COMMUNITY): Payer: Medicare Other

## 2017-11-11 DIAGNOSIS — D62 Acute posthemorrhagic anemia: Secondary | ICD-10-CM

## 2017-11-11 DIAGNOSIS — F05 Delirium due to known physiological condition: Secondary | ICD-10-CM

## 2017-11-11 DIAGNOSIS — Z96649 Presence of unspecified artificial hip joint: Secondary | ICD-10-CM

## 2017-11-11 DIAGNOSIS — M978XXA Periprosthetic fracture around other internal prosthetic joint, initial encounter: Secondary | ICD-10-CM

## 2017-11-11 DIAGNOSIS — Z515 Encounter for palliative care: Secondary | ICD-10-CM

## 2017-11-11 LAB — URINALYSIS, ROUTINE W REFLEX MICROSCOPIC
Bacteria, UA: NONE SEEN
Bilirubin Urine: NEGATIVE
GLUCOSE, UA: 50 mg/dL — AB
Ketones, ur: NEGATIVE mg/dL
NITRITE: NEGATIVE
PH: 5 (ref 5.0–8.0)
Protein, ur: 30 mg/dL — AB
Specific Gravity, Urine: 1.019 (ref 1.005–1.030)

## 2017-11-11 LAB — BASIC METABOLIC PANEL
Anion gap: 12 (ref 5–15)
BUN: 14 mg/dL (ref 8–23)
CO2: 21 mmol/L — AB (ref 22–32)
CREATININE: 1.5 mg/dL — AB (ref 0.61–1.24)
Calcium: 8 mg/dL — ABNORMAL LOW (ref 8.9–10.3)
Chloride: 100 mmol/L (ref 98–111)
GFR calc Af Amer: 52 mL/min — ABNORMAL LOW (ref >60)
GFR calc non Af Amer: 45 mL/min — ABNORMAL LOW (ref >60)
Glucose, Bld: 209 mg/dL — ABNORMAL HIGH (ref 70–99)
POTASSIUM: 3.8 mmol/L (ref 3.5–5.1)
Sodium: 133 mmol/L — ABNORMAL LOW (ref 135–145)

## 2017-11-11 LAB — CBC
HEMATOCRIT: 26 % — AB (ref 39.0–52.0)
HEMOGLOBIN: 8.1 g/dL — AB (ref 13.0–17.0)
MCH: 28.4 pg (ref 26.0–34.0)
MCHC: 31.2 g/dL (ref 30.0–36.0)
MCV: 91.2 fL (ref 78.0–100.0)
Platelets: 236 10*3/uL (ref 150–400)
RBC: 2.85 MIL/uL — AB (ref 4.22–5.81)
RDW: 14 % (ref 11.5–15.5)
WBC: 22.5 10*3/uL — ABNORMAL HIGH (ref 4.0–10.5)

## 2017-11-11 LAB — GLUCOSE, CAPILLARY
GLUCOSE-CAPILLARY: 161 mg/dL — AB (ref 70–99)
GLUCOSE-CAPILLARY: 121 mg/dL — AB (ref 70–99)
Glucose-Capillary: 185 mg/dL — ABNORMAL HIGH (ref 70–99)
Glucose-Capillary: 184 mg/dL — ABNORMAL HIGH (ref 70–99)
Glucose-Capillary: 238 mg/dL — ABNORMAL HIGH (ref 70–99)

## 2017-11-11 LAB — PREPARE RBC (CROSSMATCH)

## 2017-11-11 MED ORDER — SODIUM CHLORIDE 0.9 % IV SOLN
INTRAVENOUS | Status: AC
  Administered 2017-11-11 – 2017-11-12 (×2): via INTRAVENOUS

## 2017-11-11 MED ORDER — SODIUM CHLORIDE 0.9 % IV SOLN
INTRAVENOUS | Status: DC
  Administered 2017-11-11: 08:00:00 via INTRAVENOUS

## 2017-11-11 MED ORDER — CLONAZEPAM 1 MG PO TABS
1.0000 mg | ORAL_TABLET | Freq: Every day | ORAL | Status: DC
  Administered 2017-11-12 – 2017-11-14 (×3): 1 mg via ORAL
  Filled 2017-11-11 (×3): qty 1

## 2017-11-11 MED ORDER — SODIUM CHLORIDE 0.9% IV SOLUTION
Freq: Once | INTRAVENOUS | Status: AC
  Administered 2017-11-11: 11:00:00 via INTRAVENOUS

## 2017-11-11 MED ORDER — QUETIAPINE FUMARATE 50 MG PO TABS
25.0000 mg | ORAL_TABLET | Freq: Every day | ORAL | Status: DC
  Administered 2017-11-11 – 2017-11-15 (×5): 25 mg via ORAL
  Filled 2017-11-11 (×6): qty 1

## 2017-11-11 MED ORDER — SODIUM CHLORIDE 0.9 % IV SOLN
1.5000 g | Freq: Four times a day (QID) | INTRAVENOUS | Status: DC
  Administered 2017-11-11 – 2017-11-13 (×9): 1.5 g via INTRAVENOUS
  Filled 2017-11-11 (×11): qty 1.5

## 2017-11-11 MED ORDER — VANCOMYCIN HCL 10 G IV SOLR
1250.0000 mg | Freq: Every day | INTRAVENOUS | Status: DC
  Administered 2017-11-11: 1250 mg via INTRAVENOUS
  Filled 2017-11-11 (×2): qty 1250

## 2017-11-11 MED ORDER — CLONAZEPAM 1 MG PO TABS
2.0000 mg | ORAL_TABLET | Freq: Every day | ORAL | Status: DC
Start: 2017-11-11 — End: 2017-11-14
  Administered 2017-11-11 – 2017-11-13 (×3): 2 mg via ORAL
  Filled 2017-11-11 (×3): qty 2

## 2017-11-11 MED ORDER — HYDROCODONE-ACETAMINOPHEN 10-325 MG PO TABS
1.0000 | ORAL_TABLET | Freq: Three times a day (TID) | ORAL | Status: DC | PRN
  Administered 2017-11-12 – 2017-11-14 (×2): 1 via ORAL
  Filled 2017-11-11 (×2): qty 1

## 2017-11-11 MED ORDER — HYDROCODONE-ACETAMINOPHEN 10-325 MG PO TABS
1.0000 | ORAL_TABLET | Freq: Four times a day (QID) | ORAL | Status: DC
  Administered 2017-11-11 – 2017-11-14 (×13): 1 via ORAL
  Filled 2017-11-11 (×13): qty 1

## 2017-11-11 MED ORDER — HYDROCODONE-ACETAMINOPHEN 10-325 MG PO TABS: 1.0000 | ORAL_TABLET | ORAL | Status: DC | PRN

## 2017-11-11 NOTE — Plan of Care (Signed)

## 2017-11-11 NOTE — Care Management Important Message (Signed)
Important Message  Patient Details  Name: [name] [name] MRN: [id number] Date of Birth: September 29, 1945   Medicare Important Message Given:  Yes    Orbie Pyo 11/11/2017, 2:32 PM

## 2017-11-11 NOTE — Progress Notes (Signed)
Pt complaining of intensive abdominal pain. RN will notify Hospitalist and continue to monitor.

## 2017-11-11 NOTE — Progress Notes (Signed)
RN retook temp rectal per Dr. Aileen Fass.  Pt rectal temp 103.3

## 2017-11-11 NOTE — Progress Notes (Signed)
Patient's surgical dressing found to have moderate amount of blood inside surgical dressing. Reinforced dressing and notified on call  Physician assistant.

## 2017-11-11 NOTE — Progress Notes (Signed)
Pt has a fever of 102.3 has not started receiving blood yet. Paged hospitalist. Awaiting call back, paging MD office awaiting call back.

## 2017-11-11 NOTE — Progress Notes (Signed)
[facility] PROGRESS NOTE    Progress Note  [name] [name]  UTM:[id number] DOB: 1945/07/01 DOA: 10/29/2017 PCP: Mellody Dance, DO     Brief Narrative:   [name] [name] is an 72 y.o. male past medical history of diabetes mellitus type 2, essential hypertension anxiety chronic narcotic use, CAD status post CABG in 5681, chronic diastolic heart failure hypothyroidism presents with right hip fracture following a mechanical fall.  Assessment/Plan:   Closed right hip fracture, initial encounter [facility]): Due to mechanical fall. Right hemi-hip arthroplasty, on 11/08/2017. Orthopedic surgery recommended 50% weightbearing. Consult Education officer, museum.  Acute blood loss anemia: Likely due to surgical procedure, dropped from 11.4-8.2. I agree with 1 unit of packed red blood cells check a CBC posttransfusion all.  CAD S/p CABG: No anginal symptoms.  Chronic diastolic heart failure: Seems to be euvolemic.  Anxiety and depression: Continue Klonopin  Essential Hypertension: Continue metoprolol relatively well controlled.  Mood disorder ([place])- mixed PTSD and depression  Type 2 diabetes mellitus with complication [facility]): Hold oral hypoglycemic agents continue sliding scale insulin. Chronic diastolic CHF (congestive heart failure) (HCC)  Acute confusional state: Likely due to surgical intervention plus or minus anesthetics. Avoid narcotics will continue Ativan. We will start him on Haldol IV as needed for agitation check a 12-lead EKG tomorrow morning. Seroquel at bedtime. Avoid benzodiazepines.  New acute kidney injury: Likely prerenal hold nephrotoxic medications. Continue IV fluids recheck a basic metabolic panel in the morning. He is getting 1 unit of packed red blood cells which should help as a volume expander  DVT prophylaxis: lovenox Family Communication:none Disposition Plan/Barrier to D/C: home in 3 days Code Status:     Code Status Orders  (From admission, onward)         Start     Ordered   11/09/17 1029  Do not attempt resuscitation (DNR)  Continuous    Question Answer Comment  In the event of cardiac or respiratory ARREST Do not call a "code blue"   In the event of cardiac or respiratory ARREST Do not perform Intubation, CPR, defibrillation or ACLS   In the event of cardiac or respiratory ARREST Use medication by any route, position, wound care, and other measures to relive pain and suffering. May use oxygen, suction and manual treatment of airway obstruction as needed for comfort.      11/09/17 1030        Code Status History    Date Active Date Inactive Code Status Order ID Comments User Context   11/09/2017 0110 11/09/2017 1030 Full Code [id number]  Vianne Bulls, MD Inpatient   07/27/2016 1240 08/01/2016 1902 Full Code [id number]  Ralene Ok, MD Inpatient   11/05/2015 1426 11/12/2015 1359 Full Code [id number]  Georganna Skeans, MD Inpatient   04/26/2015 0205 05/06/2015 1501 Full Code [id number]  Gabriel Rainwater, RN Inpatient   08/24/2014 0922 08/27/2014 1714 Full Code [id number]  Caren Griffins, MD ED   01/01/2014 1216 01/01/2014 1806 Full Code [id number]  Linus Mako, PA-C ED   03/01/2013 1745 03/03/2013 1809 Full Code [id number]  Erlene Senters, PA-C Inpatient   02/28/2013 2255 03/01/2013 1745 Full Code [id number]  Annita Brod, MD Inpatient    Advance Directive Documentation     Most Recent Value  Type of Advance Directive  Healthcare Power of Attorney, Living will  Pre-existing out of facility DNR order (yellow form or pink MOST form)  -  "MOST" Form in Place?  -  IV Access:    Peripheral IV   Procedures and diagnostic studies:   Dg C-arm 1-60 Min  Result Date: 11/09/2017 CLINICAL DATA:  Open reduction internal fixation of periprosthetic fracture EXAM: RIGHT FEMUR 2 VIEWS; DG C-ARM 61-120 MIN COMPARISON:  CT 11/09/2017 FINDINGS: A total of 1 minutes 13 seconds of fluoroscopic time was utilized. Seven images were  acquired intraoperatively status post cerclage wire fixation encircling a proximal periprosthetic femoral fracture surrounding the femoral component of a right hip arthroplasty. Lateral plate and screw fixation is seen along the distal femoral diaphysis extending to the metaphysis and adjacent to a pre-existing knee arthroplasty. No immediate intraoperative complications are identified. IMPRESSION: ORIF of right femur as above without immediate intraoperative complications. A total of 1 minutes 13 seconds of fluoroscopic time was utilized and 7 images were acquired. Electronically Signed   By: Ashley Royalty M.D.   On: 10/28/2017 22:48   Dg C-arm 1-60 Min  Result Date: 11/07/2017 CLINICAL DATA:  Open reduction internal fixation of periprosthetic fracture EXAM: RIGHT FEMUR 2 VIEWS; DG C-ARM 61-120 MIN COMPARISON:  CT 11/09/2017 FINDINGS: A total of 1 minutes 13 seconds of fluoroscopic time was utilized. Seven images were acquired intraoperatively status post cerclage wire fixation encircling a proximal periprosthetic femoral fracture surrounding the femoral component of a right hip arthroplasty. Lateral plate and screw fixation is seen along the distal femoral diaphysis extending to the metaphysis and adjacent to a pre-existing knee arthroplasty. No immediate intraoperative complications are identified. IMPRESSION: ORIF of right femur as above without immediate intraoperative complications. A total of 1 minutes 13 seconds of fluoroscopic time was utilized and 7 images were acquired. Electronically Signed   By: Ashley Royalty M.D.   On: 10/19/2017 22:48   Dg C-arm 1-60 Min  Result Date: 11/12/2017 CLINICAL DATA:  Open reduction internal fixation of periprosthetic fracture EXAM: RIGHT FEMUR 2 VIEWS; DG C-ARM 61-120 MIN COMPARISON:  CT 11/09/2017 FINDINGS: A total of 1 minutes 13 seconds of fluoroscopic time was utilized. Seven images were acquired intraoperatively status post cerclage wire fixation encircling a  proximal periprosthetic femoral fracture surrounding the femoral component of a right hip arthroplasty. Lateral plate and screw fixation is seen along the distal femoral diaphysis extending to the metaphysis and adjacent to a pre-existing knee arthroplasty. No immediate intraoperative complications are identified. IMPRESSION: ORIF of right femur as above without immediate intraoperative complications. A total of 1 minutes 13 seconds of fluoroscopic time was utilized and 7 images were acquired. Electronically Signed   By: Ashley Royalty M.D.   On: 11/06/2017 22:48   Dg Femur, Min 2 Views Right  Result Date: 10/26/2017 CLINICAL DATA:  Open reduction internal fixation of periprosthetic fracture EXAM: RIGHT FEMUR 2 VIEWS; DG C-ARM 61-120 MIN COMPARISON:  CT 11/09/2017 FINDINGS: A total of 1 minutes 13 seconds of fluoroscopic time was utilized. Seven images were acquired intraoperatively status post cerclage wire fixation encircling a proximal periprosthetic femoral fracture surrounding the femoral component of a right hip arthroplasty. Lateral plate and screw fixation is seen along the distal femoral diaphysis extending to the metaphysis and adjacent to a pre-existing knee arthroplasty. No immediate intraoperative complications are identified. IMPRESSION: ORIF of right femur as above without immediate intraoperative complications. A total of 1 minutes 13 seconds of fluoroscopic time was utilized and 7 images were acquired. Electronically Signed   By: Ashley Royalty M.D.   On: 10/28/2017 22:48   Dg Femur [place] 2 Views Right  Result Date: 11/11/2017 CLINICAL DATA:  Periprosthetic right proximal femur fracture status post ORIF EXAM: RIGHT FEMUR PORTABLE 2 VIEW COMPARISON:  11/09/2017 right hip CT FINDINGS: Right hip hemiarthroplasty and right total knee arthroplasty. Status post transfixation of periprosthetic right proximal femur fracture in anatomic alignment with lateral surgical plate and multiple interlocking  screws and cerclage wires. Lateral skin staples. Expected postoperative soft tissue gas in the lateral right thigh. IMPRESSION: Anatomic alignment of periprosthetic right proximal femur fracture status post ORIF. Electronically Signed   By: Ilona Sorrel M.D.   On: 11/11/2017 01:41     Medical Consultants:    None.  Anti-Infectives:   None  Subjective:    [name] he relates his pain is controlled.  Objective:    Vitals:   10/17/2017 2215 10/17/2017 2225 10/25/2017 2242 11/11/17 0444  BP: 120/72 131/65 (!) 141/71 (!) 158/74  Pulse: 90 89 89 (!) 124  Resp: 20 19    Temp: (!) 97.2 F (36.2 C)  97.6 F (36.4 C) 98.8 F (37.1 C)  TempSrc:   Oral Oral  SpO2: 100% 97% 98% 98%  Weight:      Height:        Intake/Output Summary (Last 24 hours) at 11/11/2017 0957 Last data filed at 11/07/2017 2215 Gross per 24 hour  Intake 3800 ml  Output 1850 ml  Net 1950 ml   Filed Weights   10/23/2017 2229 10/25/2017 1341  Weight: 104.3 kg 104.3 kg    Exam: General exam: In no acute distress. Respiratory system: Good air movement and clear to auscultation. Cardiovascular system: S1 & S2 heard, RRR.  Gastrointestinal system: Abdomen is nondistended, soft and nontender.  Central nervous system:  No focal neurological deficits. Extremities: No pedal edema, with a retractor in place. Skin: No rashes, lesions or ulcers Psychiatry: Confused talking incoherently.   Data Reviewed:    Labs: Basic Metabolic Panel: Recent Labs  Lab 11/09/17 0355 11/02/2017 1949 11/11/17 0509  NA 135 138 133*  K 3.8 4.4 3.8  CL 98  --  100  CO2 29  --  21*  GLUCOSE 135* 143* 209*  BUN 10  --  14  CREATININE 1.16  --  1.50*  CALCIUM 8.6*  --  8.0*   GFR Estimated Creatinine Clearance: 53.8 mL/min (A) (by C-G formula based on SCr of 1.5 mg/dL (H)). Liver Function Tests: No results for input(s): AST, ALT, ALKPHOS, BILITOT, PROT, ALBUMIN in the last 168 hours. No results for input(s): LIPASE, AMYLASE  in the last 168 hours. No results for input(s): AMMONIA in the last 168 hours. Coagulation profile No results for input(s): INR, PROTIME in the last 168 hours.  CBC: Recent Labs  Lab 11/09/17 0355 10/25/2017 1949 11/11/17 0509  WBC 10.4  --  22.5*  HGB 11.4* 10.9* 8.1*  HCT 36.8* 32.0* 26.0*  MCV 88.9  --  91.2  PLT 251  --  236   Cardiac Enzymes: No results for input(s): CKTOTAL, CKMB, CKMBINDEX, TROPONINI in the last 168 hours. BNP (last 3 results) No results for input(s): PROBNP in the last 8760 hours. CBG: Recent Labs  Lab 10/18/2017 0903 10/20/2017 1201 11/03/2017 2144 11/11/17 0446 11/11/17 0802  GLUCAP 146* 128* 143* 185* 184*   D-Dimer: No results for input(s): DDIMER in the last 72 hours. Hgb A1c: No results for input(s): HGBA1C in the last 72 hours. Lipid Profile: No results for input(s): CHOL, HDL, LDLCALC, TRIG, CHOLHDL, LDLDIRECT in the last 72 hours. Thyroid function studies: No results for input(s): TSH, T4TOTAL,  T3FREE, THYROIDAB in the last 72 hours.  Invalid input(s): FREET3 Anemia work up: No results for input(s): VITAMINB12, FOLATE, FERRITIN, TIBC, IRON, RETICCTPCT in the last 72 hours. Sepsis Labs: Recent Labs  Lab 11/09/17 0355 11/11/17 0509  WBC 10.4 22.5*   Microbiology Recent Results (from the past 240 hour(s))  Surgical PCR screen     Status: None   Collection Time: 11/09/17  5:30 AM  Result Value Ref Range Status   MRSA, PCR NEGATIVE NEGATIVE Final   Staphylococcus aureus NEGATIVE NEGATIVE Final    Comment: (NOTE) The Xpert SA Assay (FDA approved for NASAL specimens in patients 64 years of age and older), is one component of a comprehensive surveillance program. It is not intended to diagnose infection nor to guide or monitor treatment. Performed at [facility], [place] [address], [place], [place] [address]      Medications:   . sodium chloride   Intravenous Once  . acetaminophen  500 mg Oral Q6H  . allopurinol  300 mg  Oral QHS  . aspirin EC  81 mg Oral BID WC  . clonazePAM  1 mg Oral Daily   And  . clonazePAM  2 mg Oral Q supper  . docusate sodium  100 mg Oral BID  . gabapentin  900 mg Oral QHS  . insulin aspart  0-9 Units Subcutaneous Q4H  . lactose free nutrition  237 mL Oral TID BM  . levothyroxine  75 mcg Oral QAC breakfast  . metoprolol tartrate  100 mg Oral BID  . mupirocin ointment  1 application Nasal BID  . pantoprazole  40 mg Oral Daily  . polyethylene glycol  17 g Oral Daily  . rOPINIRole  0.5 mg Oral QHS  . senna-docusate  2 tablet Oral QHS  . venlafaxine XR  225 mg Oral QHS   Continuous Infusions: . sodium chloride 125 mL/hr at 11/11/17 0824     LOS: 3 days   [facility] Pager [phone]  *Please refer to [email or web address], password [email or web address] to get updated schedule on who will round on this patient, as hospitalists switch teams weekly. If 7PM-7AM, please contact night-coverage at [email or web address], password [email or web address] for any overnight needs.  11/11/2017, 9:57 AM

## 2017-11-11 NOTE — Progress Notes (Signed)
Called pt wife cell phone and house phone to get verbal consent for pt to receive blood. Left a message  awaiting call back.

## 2017-11-11 NOTE — Progress Notes (Signed)
RN went to start the 1u of blood that the MD put the order in for prior to starting the blood pt temp was 102.3. Notified hospitalist and was told to take temp rectally. Pt rectal temp was 103.3. RN notified MD, he put order in for abx and blood cultures and chest xray. RN asked MD if she should hold the blood and MD said yes. Cultures were drawn, RN starts Abx. Chest xray done. UA sent to lab.

## 2017-11-11 NOTE — Progress Notes (Addendum)
PATIENT ID: [name] [name]  MRN: [id number]  DOB/AGE:  24-Dec-1945 / 72 y.o.  1 Day Post-Op Procedure(s) (LRB): OPEN REDUCTION INTERNAL FIXATION (ORIF) PERIPROSTHETIC FRACTURE (Right)    PROGRESS NOTE Subjective: Patient is alert, oriented, no Nausea, no Vomiting, yes passing gas, . Taking PO well. Denies SOB, Chest or Calf Pain. Using Incentive Spirometer, PAS in place. Ambulate Partial weightbearing only under the direct supervision of physical therapist or nursing staff.  Patient is a severe fall risk and has had postoperative psychoses after his last 2 procedures.Patient reports that his pain is severe, however, he is been on high-dose narcotics for the last 20 years and pain control will be difficult.   .    Objective: Vital signs in last 24 hours: Vitals:   10/17/2017 2215 11/02/2017 2225 11/01/2017 2242 11/11/17 0444  BP: 120/72 131/65 (Abnormal) 141/71 (Abnormal) 158/74  Pulse: 90 89 89 (Abnormal) 124  Resp: 20 19    Temp: (Abnormal) 97.2 F (36.2 C)  97.6 F (36.4 C) 98.8 F (37.1 C)  TempSrc:   Oral Oral  SpO2: 100% 97% 98% 98%  Weight:      Height:          Intake/Output from previous day: I/O last 3 completed shifts: In: 3800 [I.V.:3000; Other:300; IV Piggyback:500] Out: 3250 [Urine:1850; Blood:1400]   Intake/Output this shift: No intake/output data recorded.   LABORATORY DATA: Recent Labs    11/09/17 0355  11/04/2017 1949 11/07/2017 2144 11/11/17 0446 11/11/17 0509 11/11/17 0802  WBC 10.4  --   --   --   --  22.5*  --   HGB 11.4*  --  10.9*  --   --  8.1*  --   HCT 36.8*  --  32.0*  --   --  26.0*  --   PLT 251  --   --   --   --  236  --   NA 135  --  138  --   --  133*  --   K 3.8  --  4.4  --   --  3.8  --   CL 98  --   --   --   --  100  --   CO2 29  --   --   --   --  21*  --   BUN 10  --   --   --   --  14  --   CREATININE 1.16  --   --   --   --  1.50*  --   GLUCOSE 135*  --  143*  --   --  209*  --   GLUCAP  --    < >  --  143* 185*  --  184*  CALCIUM  8.6*  --   --   --   --  8.0*  --    < > = values in this interval not displayed.    Examination: Neurologically intact ABD soft Neurovascular intact Sensation intact distally Intact pulses distally Dorsiflexion/Plantar flexion intact Incision: moderate drainage No cellulitis present Compartment soft} AP and lateral the right femur shows the 10 inch bowed AML stem to be in good position and the distal NCB plate 15 hole also to be in good position.  Fractures appear to be near anatomically reduced.  Patient did have moderate dressing.  We rolled him up on his side and placed a new dressing today.  We used 4 x 4's and paper tape.  Assessment:   1 Day Post-Op Procedure(s) (LRB): OPEN REDUCTION INTERNAL FIXATION (ORIF) PERIPROSTHETIC FRACTURE (Right) ADDITIONAL DIAGNOSIS:  Expected Acute Blood Loss Anemia, Diabetes and Hypertension,Congestive heart failure, chronic pain management, grade 3 renal failure, chronic,Status post colostomy takedown last year, postoperative psychoses that usually last about a week after his surgeries.  Plan: PT/OT 50% weightbearing with physical therapy  Patient's hemoglobin is 8.1.  After a 1200 cc blood loss yesterday.  He does feel somewhat weak.  We will transfuse 1 unit of packed red cells today.  DVT Prophylaxis: SCDx72 hrs, ASA 81 mg BID x 2 weeks  DISCHARGE PLAN: Skilled Nursing Facility/Rehab  DISCHARGE NEEDS: HHPT, Walker and 3-in-1 comode seat

## 2017-11-11 NOTE — Evaluation (Signed)
Physical Therapy Evaluation Patient Details Name: [name] [name] MRN: [id number] DOB: 14-May-1945 Today's Date: 11/11/2017   History of Present Illness  [name] [name] is an 72 y.o. male past medical history of diabetes mellitus type 2, essential hypertension anxiety chronic narcotic use, CAD status post CABG in 0240, chronic diastolic heart failure,PTSD, anxiety, hypothyroidism presents with right periprosthetic hip fracture following a mechanical fall.  Now s/p Open reduction internal fixation of right midshaft femur periprosthetic fracture, revision of hemi-hip arthroplasty.   Clinical Impression  Patient presents with decreased independence with mobility due to pain, decreased R LE strength, ROM and limited weight bearing with hip precautions.  He presents currently +2 max to total A for mobility due to delirium, weakness and pain.  Feel he will need STSNF level rehab upon d/c.  Was independent with mobility prior to fall.  PT to follow.    Follow Up Recommendations SNF;Supervision/Assistance - 24 hour    Equipment Recommendations  Other (comment)(TBA)    Recommendations for Other Services       Precautions / Restrictions Precautions Precautions: Fall;Posterior Hip Precaution Comments: practiced with turning for hygiene in bed w/pillow b/w legs; RN and NT in room when discussed/demontrated Restrictions Weight Bearing Restrictions: Yes RLE Weight Bearing: Touchdown weight bearing Other Position/Activity Restrictions: order for PWB, PA on phone reports [facility] with trained staff only      Mobility  Bed Mobility Overal bed mobility: Needs Assistance Bed Mobility: Supine to Sit;Sit to Supine;Rolling Rolling: Max assist;+2 for safety/equipment   Supine to sit: Max assist;+2 for physical assistance;+2 for safety/equipment;HOB elevated Sit to supine: Max assist;Total assist;+2 for physical assistance   General bed mobility comments: assist for legs off bed and heavy lifting assist for  trunk due to pt on bed with linens stripped after having BM and unable to scoot well even assisted; to supine assist for legs and trunk and scooting up in bed  Transfers Overall transfer level: Needs assistance Equipment used: Rolling walker (2 wheeled) Transfers: Sit to/from Stand Sit to Stand: Max assist;+2 physical assistance         General transfer comment: lifting help to stand and pt able to stand with RW while nurse and tech cleaning and making the bed with mod cues for [facility] R LE and able to maintain with mod A and walker for balance  Ambulation/Gait                Stairs            Wheelchair Mobility    Modified Rankin (Stroke Patients Only)       Balance Overall balance assessment: Needs assistance   Sitting balance-Leahy Scale: Poor Sitting balance - Comments: min a for sitting balance   Standing balance support: Bilateral upper extremity supported Standing balance-Leahy Scale: Poor Standing balance comment: UE support on walker and mod A for balance in standing while bed changed                             Pertinent Vitals/Pain Pain Assessment: Faces Faces Pain Scale: Hurts whole lot Pain Location: R hip with movmenet Pain Descriptors / Indicators: Moaning;Crying;Grimacing;Guarding Pain Intervention(s): Monitored during session;Repositioned;Patient requesting pain meds-RN notified;Ice applied;Limited activity within patient's tolerance    Home Living Family/patient expects to be discharged to:: Skilled nursing facility Living Arrangements: Spouse/significant other                    Prior  Function           Comments: Patient unable to state previous functional level due to delirium and wife not in room     Hand Dominance        Extremity/Trunk Assessment   Upper Extremity Assessment Upper Extremity Assessment: Generalized weakness    Lower Extremity Assessment Lower Extremity Assessment: RLE  deficits/detail RLE Deficits / Details: able to flex knee some sitting EOB with assist, but significantly painful; assist to move off bed and to lift back on bed RLE: Unable to fully assess due to pain       Communication   Communication: No difficulties  Cognition Arousal/Alertness: Awake/alert Behavior During Therapy: Anxious Overall Cognitive Status: Impaired/Different from baseline Area of Impairment: Orientation;Memory;Following commands;Safety/judgement                 Orientation Level: Disoriented to;Person;Place;Time;Situation   Memory: Decreased short-term memory;Decreased recall of precautions Following Commands: Follows one step commands inconsistently;Follows one step commands with increased time Safety/Judgement: Decreased awareness of deficits            General Comments General comments (skin integrity, edema, etc.): Patient with large BM in bed and assisted nursing with cleaning    Exercises     Assessment/Plan    PT Assessment Patient needs continued PT services  PT Problem List Decreased activity tolerance;Decreased balance;Decreased knowledge of use of DME;Pain;Decreased cognition;Decreased range of motion;Decreased mobility;Decreased strength;Decreased safety awareness;Decreased knowledge of precautions       PT Treatment Interventions DME instruction;Therapeutic activities;Gait training;Therapeutic exercise;Patient/family education;Balance training;Functional mobility training;Neuromuscular re-education    PT Goals (Current goals can be found in the Care Plan section)  Acute Rehab PT Goals Patient Stated Goal: none stated PT Goal Formulation: Patient unable to participate in goal setting Time For Goal Achievement: 11/25/17 Potential to Achieve Goals: Fair    Frequency Min 3X/week   Barriers to discharge        Co-evaluation               AM-PAC PT "6 Clicks" Daily Activity  Outcome Measure Difficulty turning over in bed  (including adjusting bedclothes, sheets and blankets)?: Unable Difficulty moving from lying on back to sitting on the side of the bed? : Unable Difficulty sitting down on and standing up from a chair with arms (e.g., wheelchair, bedside commode, etc,.)?: Unable Help needed moving to and from a bed to chair (including a wheelchair)?: Total Help needed walking in hospital room?: Total Help needed climbing 3-5 steps with a railing? : Total 6 Click Score: 6    End of Session Equipment Utilized During Treatment: Gait belt Activity Tolerance: Patient limited by pain;Other (comment)(deferred OOB due to delirium) Patient left: in bed;with call bell/phone within reach;with nursing/sitter in room;with bed alarm set   PT Visit Diagnosis: Other abnormalities of gait and mobility (R26.89);Difficulty in walking, not elsewhere classified (R26.2);History of falling (Z91.81)    Time: 8032-1224 PT Time Calculation (min) (ACUTE ONLY): 33 min   Charges:   PT Evaluation $PT Eval High Complexity: 1 High PT Treatments $Therapeutic Activity: 8-22 mins        Magda Kiel, PT Acute Rehabilitation Services [phone] 11/11/2017   Reginia Naas 11/11/2017, 1:19 PM

## 2017-11-11 NOTE — Progress Notes (Signed)
Pharmacy Antibiotic Note  [name] [name] is a 72 y.o. male admitted on 11/11/2017 with mechanical fall.  Pharmacy has been consulted for Vancomycin dosing for fever. Pt also on Unasyn. Tm 102.9. WBC up to 22.5.   SCr upward trend to 1.5, est CrCl 50 ml/min.   Antimicrobials this admission: 9/26 Unasyn >>  9/26 Vancomycin >> 9/25 Cefazolin>>9/26   Microbiology results: 9/25 BCx:  9/24 MRSA PCR: negative  Plan: Vancomycin 1250mg  IV q24h Will f/u micro data, renal function, and pt's clinical condition Vanc trough prn   Height: 5\' 10"  (177.8 cm) Weight: 230 lb (104.3 kg) IBW/kg (Calculated) : 73  Temp (24hrs), Avg:99.4 F (37.4 C), Min:97.2 F (36.2 C), Max:102.9 F (39.4 C)  Recent Labs  Lab 11/09/17 0355 11/11/17 0509  WBC 10.4 22.5*  CREATININE 1.16 1.50*    Estimated Creatinine Clearance: 53.8 mL/min (A) (by C-G formula based on SCr of 1.5 mg/dL (H)).    Allergies  Allergen Reactions  . Ciprofloxacin Hcl Other (See Comments)    Causes pain in tendons, feels like muscle spasms. BLACK BOX WARNING RE: POSSIBLE TENDON RUPTURE, MYALGIAS, and OTHERS  . Fentanyl Other (See Comments)    Pt can handle having Fentanyl under anesthesia - does not tolerate after surgery for post op pain - causes mood swings, confusion, and agitation    . Flomax [Tamsulosin Hcl] Other (See Comments)    DROPS BLOOD PRESSURE  . Ace Inhibitors Cough    Thank you for allowing pharmacy to be a part of this patient's care.  Sherlon Handing, PharmD, BCPS Clinical pharmacist  **Pharmacist phone directory can now be found on [email or web address] (PW [email or web address]).  Listed under [place]. 11/11/2017 12:47 PM

## 2017-11-11 NOTE — Progress Notes (Signed)
Daily Progress Note   Patient Name: [name] [name]       Date: 11/11/2017 DOB: 11-18-1945  Age: 72 y.o. MRN#: [id number] Attending Physician: Charlynne Cousins, MD Primary Care Physician: Mellody Dance, DO Admit Date: 10/20/2017  Reason for Consultation/Follow-up: Establishing goals of care and Psychosocial/spiritual support  Subjective: Spoke with patient at bedside. He is better than I anticipated.  He is having delirium but is able to appropriately answer questions.  He tells me he is having burning and would like the foley catheter taken out.  He talks to me about his two sons [name] [name] and [name] [name].  He mentions his wife used to work for Dr. [employer]el in the [place] and that he is originally from the [place].  I spoke with his wife on the phone.  She explained his medication schedule as follows: 0800 20 mg Norco and 1 mg klonopin. 1200 10 mg Norco 1600 10 mg Norco 1800 10 mg Norco and 2 mg Klonopin (6 pm is his normal bed time) Usually he takes 1-2 Norco in addition sometime in the middle of the night  Discussed with Dr. Venetia Constable who feels it is important to keep [name] on his normal regimen in order to reduce delirium as much as possible.  He has already prescribed PRN seroquel and Haldol  Assessment: 72 yo male with moderate post op delirium after ORIF surgery yesterday.   Patient Profile/HPI:  72 y.o. male  with past medical history of type 2 diabetes mellitus, hypertension, anxiety, coronary artery disease status post CABG, chronic diastolic CHF, and hypothyroidism admitted on 11/12/2017 with severe right hip pain and fracture after fall.    Length of Stay: 3  Current Medications: Scheduled Meds:  . acetaminophen  500 mg Oral Q6H  . allopurinol  300 mg Oral QHS  . aspirin EC   81 mg Oral BID WC  . clonazePAM  1 mg Oral Daily   And  . clonazePAM  2 mg Oral Q supper  . docusate sodium  100 mg Oral BID  . gabapentin  900 mg Oral QHS  . HYDROcodone-acetaminophen  1 tablet Oral QID  . insulin aspart  0-9 Units Subcutaneous Q4H  . lactose free nutrition  237 mL Oral TID BM  . levothyroxine  75 mcg Oral QAC breakfast  .  metoprolol tartrate  100 mg Oral BID  . pantoprazole  40 mg Oral Daily  . polyethylene glycol  17 g Oral Daily  . QUEtiapine  25 mg Oral QHS  . rOPINIRole  0.5 mg Oral QHS  . senna-docusate  2 tablet Oral QHS  . venlafaxine XR  225 mg Oral QHS    Continuous Infusions: . sodium chloride      PRN Meds: acetaminophen, bisacodyl, diphenhydrAMINE, guaiFENesin, haloperidol lactate, hydrALAZINE, HYDROcodone-acetaminophen, menthol-cetylpyridinium **OR** phenol, metoCLOPramide **OR** metoCLOPramide (REGLAN) injection, morphine injection, ondansetron (ZOFRAN) IV, polyvinyl alcohol  Physical Exam        Well developed man awake and Talking.  Having intermittent confusion.  Complains of pain CV difficult to hear but no m/r/g Resp no distress, no w/c/r Abdomen firm, nt, nd, +BS   Vital Signs: BP (!) 158/74 (BP Location: Left Arm)   Pulse (!) 124   Temp 98.8 F (37.1 C) (Oral)   Resp 19   Ht 5\' 10"  (1.778 m)   Wt 104.3 kg   SpO2 98%   BMI 33.00 kg/m  SpO2: SpO2: 98 % O2 Device: O2 Device: Nasal Cannula O2 Flow Rate: O2 Flow Rate (L/min): 2 L/min  Intake/output summary:   Intake/Output Summary (Last 24 hours) at 11/11/2017 1100 Last data filed at 11/11/2017 1000 Gross per 24 hour  Intake 3800 ml  Output 2300 ml  Net 1500 ml   LBM: Last BM Date: 10/17/2017 Baseline Weight: Weight: 104.3 kg Most recent weight: Weight: 104.3 kg       Palliative Assessment/Data: 50%    Flowsheet Rows     Most Recent Value  Intake Tab  Referral Department  Hospitalist  Unit at Time of Referral  Med/Surg Unit  Palliative Care Primary Diagnosis  Trauma    Date Notified  11/09/17  Palliative Care Type  New Palliative care  Reason for referral  Clarify Goals of Care  Date of Admission  11/07/2017  Date first seen by Palliative Care  11/09/17  # of days Palliative referral response time  0 Day(s)  # of days IP prior to Palliative referral  1  Clinical Assessment  Psychosocial & Spiritual Assessment  Palliative Care Outcomes      Patient Active Problem List   Diagnosis Date Noted  . Closed right hip fracture, initial encounter ([place]) 11/09/2017  . Dyslipidemia 08/31/2017  . Chronic gouty arthropathy- L 1st toe 06/09/2017  . Bunion of great toe of left foot 06/09/2017  . Hallux valgus (acquired), left foot 06/09/2017  . Toenail fungus 06/09/2017  . Anemia 04/07/2017  . Chronic diastolic CHF (congestive heart failure) ([place]) 04/01/2017  . Hypertension associated with diabetes ([place]) 04/01/2017  . Mixed diabetic hyperlipidemia associated with type 2 diabetes mellitus ([place]) 04/01/2017  . Fatigue 04/01/2017  . History of smoking 30 or more pack years 04/01/2017  . S/P hernia repair 07/27/2016  . Preop cardiovascular exam 05/30/2016  . S/P colostomy takedown 11/05/2015  . Abdominal pain   . Agitation   . Threatening to others   . Diverticulitis of large intestine with perforation without bleeding   . Type 2 diabetes mellitus with complication ([place])   . CAD in native artery   . Diverticulitis 04/26/2015  . Perforated diverticulum of large intestine 04/26/2015  . Diverticulitis of colon with perforation 04/26/2015  . Mood disorder ([place])- mixed PTSD and depression 04/26/2015  . GERD (gastroesophageal reflux disease) 04/26/2015  . Hypothyroidism 04/26/2015  . Acute encephalopathy 08/25/2014  . Acute on chronic renal failure (  [place]) 08/25/2014  . Blood poisoning   . Elevated troponin   . CAP (community acquired pneumonia) 08/24/2014  . Sepsis ([place]) 08/24/2014  . Hypokalemia 08/24/2014  . AKI (acute kidney injury) ([place]) 08/24/2014  .  Alcohol abuse 08/24/2014  . DM (diabetes mellitus), type 2 ([place]) 02/28/2013  . Abnormal EKG 02/28/2013  . F[place] 11/05/2011  . Hyperlipidemia 02/06/2011  . Insomnia 02/06/2011  . Hypertension   . CAD (coronary artery disease)     Palliative Care Plan    Recommendations/Plan:  Will schedule opioids and benzos to mirror his home regimen  Agree with PRN haldol and seroquel for delirium / agitation  PMT will continue to follow intermittently and support [name] [name] (Wife - retired OR Therapist, sports)  Goals of Care and Additional Recommendations:  Limitations on Scope of Treatment: Full Scope Treatment  Code Status:  DNR  Prognosis:   Unable to determine   Discharge Planning:  To Be Determined  Care plan was discussed with wife and attending [facility] physician  Thank you for allowing the Palliative Medicine Team to assist in the care of this patient.  Total time spent:  35 min.     Greater than 50%  of this time was spent counseling and coordinating care related to the above assessment and plan.  Florentina Jenny, PA-C Palliative Medicine  Please contact Palliative MedicineTeam phone at [phone] for questions and concerns between 7 am - 7 pm.   Please see AMION for individual provider pager numbers.

## 2017-11-12 ENCOUNTER — Encounter (HOSPITAL_COMMUNITY): Payer: Self-pay | Admitting: Orthopedic Surgery

## 2017-11-12 DIAGNOSIS — J690 Pneumonitis due to inhalation of food and vomit: Secondary | ICD-10-CM

## 2017-11-12 LAB — BASIC METABOLIC PANEL
Anion gap: 8 (ref 5–15)
BUN: 13 mg/dL (ref 8–23)
CALCIUM: 7.8 mg/dL — AB (ref 8.9–10.3)
CO2: 24 mmol/L (ref 22–32)
CREATININE: 1.19 mg/dL (ref 0.61–1.24)
Chloride: 101 mmol/L (ref 98–111)
GFR calc non Af Amer: 59 mL/min — ABNORMAL LOW (ref >60)
Glucose, Bld: 148 mg/dL — ABNORMAL HIGH (ref 70–99)
Potassium: 3.8 mmol/L (ref 3.5–5.1)
Sodium: 133 mmol/L — ABNORMAL LOW (ref 135–145)

## 2017-11-12 LAB — CBC
HEMATOCRIT: 20.5 % — AB (ref 39.0–52.0)
Hemoglobin: 6.5 g/dL — CL (ref 13.0–17.0)
MCH: 28.6 pg (ref 26.0–34.0)
MCHC: 31.7 g/dL (ref 30.0–36.0)
MCV: 90.3 fL (ref 78.0–100.0)
Platelets: 220 10*3/uL (ref 150–400)
RBC: 2.27 MIL/uL — ABNORMAL LOW (ref 4.22–5.81)
RDW: 14.4 % (ref 11.5–15.5)
WBC: 18.8 10*3/uL — ABNORMAL HIGH (ref 4.0–10.5)

## 2017-11-12 LAB — HEMOGLOBIN AND HEMATOCRIT, BLOOD
HEMATOCRIT: 23.7 % — AB (ref 39.0–52.0)
HEMOGLOBIN: 7.6 g/dL — AB (ref 13.0–17.0)

## 2017-11-12 LAB — GLUCOSE, CAPILLARY
GLUCOSE-CAPILLARY: 169 mg/dL — AB (ref 70–99)
GLUCOSE-CAPILLARY: 131 mg/dL — AB (ref 70–99)
Glucose-Capillary: 128 mg/dL — ABNORMAL HIGH (ref 70–99)
Glucose-Capillary: 139 mg/dL — ABNORMAL HIGH (ref 70–99)
Glucose-Capillary: 171 mg/dL — ABNORMAL HIGH (ref 70–99)

## 2017-11-12 LAB — PREPARE RBC (CROSSMATCH)

## 2017-11-12 LAB — MAGNESIUM: MAGNESIUM: 1.6 mg/dL — AB (ref 1.7–2.4)

## 2017-11-12 MED ORDER — TIZANIDINE HCL 2 MG PO TABS: 2.0000 mg | ORAL_TABLET | Freq: Four times a day (QID) | ORAL | 0 refills | Status: AC | PRN

## 2017-11-12 MED ORDER — FUROSEMIDE 10 MG/ML IJ SOLN
40.0000 mg | Freq: Two times a day (BID) | INTRAMUSCULAR | Status: AC
  Administered 2017-11-12 – 2017-11-13 (×2): 40 mg via INTRAVENOUS
  Filled 2017-11-12 (×2): qty 4

## 2017-11-12 MED ORDER — MAGNESIUM OXIDE 400 (241.3 MG) MG PO TABS
400.0000 mg | ORAL_TABLET | Freq: Two times a day (BID) | ORAL | Status: AC
  Administered 2017-11-12 (×2): 400 mg via ORAL
  Filled 2017-11-12 (×2): qty 1

## 2017-11-12 MED ORDER — SODIUM CHLORIDE 0.9% IV SOLUTION: Freq: Once | INTRAVENOUS | Status: DC

## 2017-11-12 MED ORDER — ASPIRIN EC 81 MG PO TBEC: 81.0000 mg | DELAYED_RELEASE_TABLET | Freq: Two times a day (BID) | ORAL | 0 refills | Status: AC

## 2017-11-12 MED ORDER — POTASSIUM CHLORIDE CRYS ER 20 MEQ PO TBCR
40.0000 meq | EXTENDED_RELEASE_TABLET | Freq: Two times a day (BID) | ORAL | Status: AC
  Administered 2017-11-12 – 2017-11-13 (×2): 40 meq via ORAL
  Filled 2017-11-12 (×2): qty 2

## 2017-11-12 MED ORDER — VANCOMYCIN HCL IN DEXTROSE 750-5 MG/150ML-% IV SOLN
750.0000 mg | Freq: Two times a day (BID) | INTRAVENOUS | Status: DC
  Administered 2017-11-12 – 2017-11-13 (×2): 750 mg via INTRAVENOUS
  Filled 2017-11-12 (×3): qty 150

## 2017-11-12 MED ORDER — HYDROCODONE-ACETAMINOPHEN 10-325 MG PO TABS: 1.0000 | ORAL_TABLET | ORAL | 0 refills | Status: AC | PRN

## 2017-11-12 NOTE — Progress Notes (Signed)
Pharmacy Antibiotic Note  [name] [name] is a 72 y.o. male admitted on 11/13/2017 with mechanical fall.  Pharmacy has been consulted for Vancomycin dosing for fever. Pt also on Unasyn. Tm 101.7. WBC trending down to 18.8.   Scr with upward trend yesterday but back to 1.19 today (est CrCl 68 ml/min). UOP not being recorded  Antimicrobials this admission: 9/26 Unasyn >>  9/26 Vancomycin >> 9/25 Cefazolin>>9/26   Microbiology results: 9/25 BCx: ngtd 9/24 MRSA PCR: negative  Plan: Change Vancomycin to 750mg  IV q12h Will f/u micro data, renal function, and pt's clinical condition Vanc trough prn   Height: 5\' 10"  (177.8 cm) Weight: 230 lb (104.3 kg) IBW/kg (Calculated) : 73  Temp (24hrs), Avg:100.4 F (38 C), Min:97.5 F (36.4 C), Max:102.9 F (39.4 C)  Recent Labs  Lab 11/09/17 0355 11/11/17 0509 11/12/17 0443  WBC 10.4 22.5* 18.8*  CREATININE 1.16 1.50* 1.19    Estimated Creatinine Clearance: 67.9 mL/min (by C-G formula based on SCr of 1.19 mg/dL).    Allergies  Allergen Reactions  . Ciprofloxacin Hcl Other (See Comments)    Causes pain in tendons, feels like muscle spasms. BLACK BOX WARNING RE: POSSIBLE TENDON RUPTURE, MYALGIAS, and OTHERS  . Fentanyl Other (See Comments)    Pt can handle having Fentanyl under anesthesia - does not tolerate after surgery for post op pain - causes mood swings, confusion, and agitation    . Flomax [Tamsulosin Hcl] Other (See Comments)    DROPS BLOOD PRESSURE  . Ace Inhibitors Cough    Thank you for allowing pharmacy to be a part of this patient's care.  Sherlon Handing, PharmD, BCPS Clinical pharmacist  **Pharmacist phone directory can now be found on [email or web address] (PW [email or web address]).  Listed under [place]. 11/12/2017 10:43 AM

## 2017-11-12 NOTE — Progress Notes (Signed)
[facility] PROGRESS NOTE    Progress Note  [name] [name]  OFB:[id number] DOB: 09-27-1945 DOA: 11/02/2017 PCP: Mellody Dance, DO     Brief Narrative:   [name] [name] is an 72 y.o. male past medical history of diabetes mellitus type 2, essential hypertension anxiety chronic narcotic use, CAD status post CABG in 7824, chronic diastolic heart failure hypothyroidism presents with right hip fracture following a mechanical fall.  Assessment/Plan:   Closed right hip fracture, initial encounter [facility]): Due to mechanical fall. Right hemi-hip arthroplasty, on 10/29/2017. Orthopedic surgery recommended 50% weightbearing. Physical therapy evaluated the patient recommended skilled nursing facility.  Acute blood loss anemia: Likely due to surgical procedure, dropped from 11.4-8.2. Patient had to be held yesterday because of fever, will go ahead and transfuse him 2 units of packed red blood cells recheck a CBC 4 hours post transfusional.  CAD S/p CABG: No anginal symptoms.  Chronic diastolic heart failure: Seems to be euvolemic.  Anxiety and depression: Continue Klonopin  Essential Hypertension: Continue metoprolol relatively well controlled.  Mood disorder ([place])- mixed PTSD and depression Continue current home medication no changes made.  Type 2 diabetes mellitus with complication [facility]): Hold oral hypoglycemic agents continue sliding scale insulin. Chronic diastolic CHF (congestive heart failure) (HCC)  Acute confusional state: Likely due to surgical intervention plus or minus anesthetics. Continue Seroquel at bedtime and Haldol IV as needed.  Lead EKG showed a QTC of 485 unchanged from previous Potassium at 4 magnesium greater than 2.  New acute kidney injury: Likely prerenal hold nephrotoxic medications. Resolved to baseline with IV fluid hydration.  New onset fever: He was started empirically on IV vancomycin and Unasyn, chest x-ray does not show any infiltrates blood  cultures remain negative to date. He has defervesced he continues to have cytosis which is improved compared to yesterday. He relates he has a new cough which is productive likely aspiration pneumonia  DVT prophylaxis: lovenox Family Communication:none Disposition Plan/Barrier to D/C: Skilled nursing facility in 3 days Code Status:     Code Status Orders  (From admission, onward)         Start     Ordered   11/09/17 1029  Do not attempt resuscitation (DNR)  Continuous    Question Answer Comment  In the event of cardiac or respiratory ARREST Do not call a "code blue"   In the event of cardiac or respiratory ARREST Do not perform Intubation, CPR, defibrillation or ACLS   In the event of cardiac or respiratory ARREST Use medication by any route, position, wound care, and other measures to relive pain and suffering. May use oxygen, suction and manual treatment of airway obstruction as needed for comfort.      11/09/17 1030        Code Status History    Date Active Date Inactive Code Status Order ID Comments User Context   11/09/2017 0110 11/09/2017 1030 Full Code [id number]  Vianne Bulls, MD Inpatient   07/27/2016 1240 08/01/2016 1902 Full Code [id number]  Ralene Ok, MD Inpatient   11/05/2015 1426 11/12/2015 1359 Full Code [id number]  Georganna Skeans, MD Inpatient   04/26/2015 0205 05/06/2015 1501 Full Code [id number]  Gabriel Rainwater, RN Inpatient   08/24/2014 0922 08/27/2014 1714 Full Code [id number]  Caren Griffins, MD ED   01/01/2014 1216 01/01/2014 1806 Full Code [id number]  Linus Mako, PA-C ED   03/01/2013 1745 03/03/2013 1809 Full Code [id number]  Erlene Senters, PA-C Inpatient   02/28/2013 2255  03/01/2013 1745 Full Code [id number]  Annita Brod, MD Inpatient    Advance Directive Documentation     Most Recent Value  Type of Advance Directive  Healthcare Power of [place], Living will  Pre-existing out of facility DNR order (yellow form or pink MOST form)  -  "MOST" Form  in Place?  -        IV Access:    Peripheral IV   Procedures and diagnostic studies:   Dg Chest Port 1 View  Result Date: 11/11/2017 CLINICAL DATA:  72 year old male with a history of fever EXAM: PORTABLE CHEST 1 VIEW COMPARISON:  11/09/2017 FINDINGS: Cardiomediastinal silhouette unchanged with cardiomegaly. No evidence of central vascular congestion. No large pleural effusion or pneumothorax. Similar appearance of elevation of left hemidiaphragm with retrocardiac opacity. Surgical changes of median sternotomy and CABG. IMPRESSION: Unchanged appearance of the chest x-ray with chronic lung changes and asymmetric elevation of left hemidiaphragm. If there is concern for left-sided pneumonia, a formal PA and lateral chest x-ray would be recommended. Surgical changes of median sternotomy and CABG. Electronically Signed   By: Corrie Mckusick D.O.   On: 11/11/2017 14:20   Dg C-arm 1-60 Min  Result Date: 11/09/2017 CLINICAL DATA:  Open reduction internal fixation of periprosthetic fracture EXAM: RIGHT FEMUR 2 VIEWS; DG C-ARM 61-120 MIN COMPARISON:  CT 11/09/2017 FINDINGS: A total of 1 minutes 13 seconds of fluoroscopic time was utilized. Seven images were acquired intraoperatively status post cerclage wire fixation encircling a proximal periprosthetic femoral fracture surrounding the femoral component of a right hip arthroplasty. Lateral plate and screw fixation is seen along the distal femoral diaphysis extending to the metaphysis and adjacent to a pre-existing knee arthroplasty. No immediate intraoperative complications are identified. IMPRESSION: ORIF of right femur as above without immediate intraoperative complications. A total of 1 minutes 13 seconds of fluoroscopic time was utilized and 7 images were acquired. Electronically Signed   By: Ashley Royalty M.D.   On: 11/09/2017 22:48   Dg C-arm 1-60 Min  Result Date: 11/04/2017 CLINICAL DATA:  Open reduction internal fixation of periprosthetic fracture  EXAM: RIGHT FEMUR 2 VIEWS; DG C-ARM 61-120 MIN COMPARISON:  CT 11/09/2017 FINDINGS: A total of 1 minutes 13 seconds of fluoroscopic time was utilized. Seven images were acquired intraoperatively status post cerclage wire fixation encircling a proximal periprosthetic femoral fracture surrounding the femoral component of a right hip arthroplasty. Lateral plate and screw fixation is seen along the distal femoral diaphysis extending to the metaphysis and adjacent to a pre-existing knee arthroplasty. No immediate intraoperative complications are identified. IMPRESSION: ORIF of right femur as above without immediate intraoperative complications. A total of 1 minutes 13 seconds of fluoroscopic time was utilized and 7 images were acquired. Electronically Signed   By: Ashley Royalty M.D.   On: 10/24/2017 22:48   Dg C-arm 1-60 Min  Result Date: 11/08/2017 CLINICAL DATA:  Open reduction internal fixation of periprosthetic fracture EXAM: RIGHT FEMUR 2 VIEWS; DG C-ARM 61-120 MIN COMPARISON:  CT 11/09/2017 FINDINGS: A total of 1 minutes 13 seconds of fluoroscopic time was utilized. Seven images were acquired intraoperatively status post cerclage wire fixation encircling a proximal periprosthetic femoral fracture surrounding the femoral component of a right hip arthroplasty. Lateral plate and screw fixation is seen along the distal femoral diaphysis extending to the metaphysis and adjacent to a pre-existing knee arthroplasty. No immediate intraoperative complications are identified. IMPRESSION: ORIF of right femur as above without immediate intraoperative complications. A total of 1 minutes 13  seconds of fluoroscopic time was utilized and 7 images were acquired. Electronically Signed   By: Ashley Royalty M.D.   On: 10/31/2017 22:48   Dg Femur, Min 2 Views Right  Result Date: 11/04/2017 CLINICAL DATA:  Open reduction internal fixation of periprosthetic fracture EXAM: RIGHT FEMUR 2 VIEWS; DG C-ARM 61-120 MIN COMPARISON:  CT  11/09/2017 FINDINGS: A total of 1 minutes 13 seconds of fluoroscopic time was utilized. Seven images were acquired intraoperatively status post cerclage wire fixation encircling a proximal periprosthetic femoral fracture surrounding the femoral component of a right hip arthroplasty. Lateral plate and screw fixation is seen along the distal femoral diaphysis extending to the metaphysis and adjacent to a pre-existing knee arthroplasty. No immediate intraoperative complications are identified. IMPRESSION: ORIF of right femur as above without immediate intraoperative complications. A total of 1 minutes 13 seconds of fluoroscopic time was utilized and 7 images were acquired. Electronically Signed   By: Ashley Royalty M.D.   On: 11/11/2017 22:48   Dg Femur Port, [place] 2 Views Right  Result Date: 11/11/2017 CLINICAL DATA:  Periprosthetic right proximal femur fracture status post ORIF EXAM: RIGHT FEMUR PORTABLE 2 VIEW COMPARISON:  11/09/2017 right hip CT FINDINGS: Right hip hemiarthroplasty and right total knee arthroplasty. Status post transfixation of periprosthetic right proximal femur fracture in anatomic alignment with lateral surgical plate and multiple interlocking screws and cerclage wires. Lateral skin staples. Expected postoperative soft tissue gas in the lateral right thigh. IMPRESSION: Anatomic alignment of periprosthetic right proximal femur fracture status post ORIF. Electronically Signed   By: Ilona Sorrel M.D.   On: 11/11/2017 01:41     Medical Consultants:    None.  Anti-Infectives:   None  Subjective:    [name] mentation is much improved today.  Objective:    Vitals:   11/11/17 1922 11/12/17 0000 11/12/17 0506 11/12/17 0657  BP: 129/67  111/65   Pulse: 100  96   Resp: 18  16   Temp: (!) 97.5 F (36.4 C) 97.6 F (36.4 C) 97.8 F (36.6 C) 97.7 F (36.5 C)  TempSrc: Oral Axillary Oral Axillary  SpO2: 99%  100%   Weight:      Height:        Intake/Output Summary (Last  24 hours) at 11/12/2017 0926 Last data filed at 11/11/2017 1509 Gross per 24 hour  Intake 689.89 ml  Output 750 ml  Net -60.11 ml   Filed Weights   11/13/2017 2229 10/22/2017 1341  Weight: 104.3 kg 104.3 kg    Exam: General exam: In no acute distress. Respiratory system: Good air movement and clear to auscultation. Cardiovascular system: S1 & S2 heard, RRR.  Gastrointestinal system: Abdomen is nondistended, soft and nontender.  Central nervous system:  No focal neurological deficits. Extremities: No pedal edema, with a retractor in place. Skin: No rashes, lesions or ulcers Psychiatry: He is able to carry on a conversation relate that he is having pain, but the pain medication seems to be controlling the pain. He would like to continue to work with physical therapy.    Data Reviewed:    Labs: Basic Metabolic Panel: Recent Labs  Lab 11/09/17 0355 10/20/2017 1949 11/11/17 0509 11/12/17 0443  NA 135 138 133* 133*  K 3.8 4.4 3.8 3.8  CL 98  --  100 101  CO2 29  --  21* 24  GLUCOSE 135* 143* 209* 148*  BUN 10  --  14 13  CREATININE 1.16  --  1.50* 1.19  CALCIUM 8.6*  --  8.0* 7.8*   GFR Estimated Creatinine Clearance: 67.9 mL/min (by C-G formula based on SCr of 1.19 mg/dL). Liver Function Tests: No results for input(s): AST, ALT, ALKPHOS, BILITOT, PROT, ALBUMIN in the last 168 hours. No results for input(s): LIPASE, AMYLASE in the last 168 hours. No results for input(s): AMMONIA in the last 168 hours. Coagulation profile No results for input(s): INR, PROTIME in the last 168 hours.  CBC: Recent Labs  Lab 11/09/17 0355 11/12/2017 1949 11/11/17 0509 11/12/17 0443  WBC 10.4  --  22.5* 18.8*  HGB 11.4* 10.9* 8.1* 6.5*  HCT 36.8* 32.0* 26.0* 20.5*  MCV 88.9  --  91.2 90.3  PLT 251  --  236 220   Cardiac Enzymes: No results for input(s): CKTOTAL, CKMB, CKMBINDEX, TROPONINI in the last 168 hours. BNP (last 3 results) No results for input(s): PROBNP in the last 8760  hours. CBG: Recent Labs  Lab 11/11/17 1152 11/11/17 1610 11/11/17 2059 11/12/17 0018 11/12/17 0509  GLUCAP 238* 161* 121* 171* 128*   D-Dimer: No results for input(s): DDIMER in the last 72 hours. Hgb A1c: No results for input(s): HGBA1C in the last 72 hours. Lipid Profile: No results for input(s): CHOL, HDL, LDLCALC, TRIG, CHOLHDL, LDLDIRECT in the last 72 hours. Thyroid function studies: No results for input(s): TSH, T4TOTAL, T3FREE, THYROIDAB in the last 72 hours.  Invalid input(s): FREET3 Anemia work up: No results for input(s): VITAMINB12, FOLATE, FERRITIN, TIBC, IRON, RETICCTPCT in the last 72 hours. Sepsis Labs: Recent Labs  Lab 11/09/17 0355 11/11/17 0509 11/12/17 0443  WBC 10.4 22.5* 18.8*   Microbiology Recent Results (from the past 240 hour(s))  Surgical PCR screen     Status: None   Collection Time: 11/09/17  5:30 AM  Result Value Ref Range Status   MRSA, PCR NEGATIVE NEGATIVE Final   Staphylococcus aureus NEGATIVE NEGATIVE Final    Comment: (NOTE) The Xpert SA Assay (FDA approved for NASAL specimens in patients 39 years of age and older), is one component of a comprehensive surveillance program. It is not intended to diagnose infection nor to guide or monitor treatment. Performed at [facility], [place] [address], [place], [place] [address]   Culture, blood (Routine X 2) w Reflex to ID Panel     Status: None (Preliminary result)   Collection Time: 11/11/17 12:03 PM  Result Value Ref Range Status   Specimen Description BLOOD LEFT ARM  Final   Special Requests   Final    BOTTLES DRAWN AEROBIC AND ANAEROBIC Blood Culture adequate volume   Culture   Final    NO GROWTH < 24 HOURS Performed at [facility], [place] [address], [place], [place] [address]    Report Status PENDING  Incomplete  Culture, blood (Routine X 2) w Reflex to ID Panel     Status: None (Preliminary result)   Collection Time: 11/11/17 12:18 PM  Result Value Ref Range Status     Specimen Description BLOOD LEFT ARM  Final   Special Requests   Final    BOTTLES DRAWN AEROBIC ONLY Blood Culture results may not be optimal due to an inadequate volume of blood received in culture bottles   Culture   Final    NO GROWTH < 24 HOURS Performed at [facility], [place] [address], [place],  [address]    Report Status PENDING  Incomplete     Medications:   . sodium chloride   Intravenous  Once  . allopurinol  300 mg Oral QHS  . aspirin EC  81 mg Oral BID WC  . clonazePAM  1 mg Oral Daily   And  . clonazePAM  2 mg Oral Q supper  . docusate sodium  100 mg Oral BID  . gabapentin  900 mg Oral QHS  . HYDROcodone-acetaminophen  1 tablet Oral QID  . insulin aspart  0-9 Units Subcutaneous Q4H  . lactose free nutrition  237 mL Oral TID BM  . levothyroxine  75 mcg Oral QAC breakfast  . metoprolol tartrate  100 mg Oral BID  . pantoprazole  40 mg Oral Daily  . polyethylene glycol  17 g Oral Daily  . QUEtiapine  25 mg Oral QHS  . rOPINIRole  0.5 mg Oral QHS  . senna-docusate  2 tablet Oral QHS  . venlafaxine XR  225 mg Oral QHS   Continuous Infusions: . sodium chloride 100 mL/hr at 11/12/17 0651  . ampicillin-sulbactam (UNASYN) IV 1.5 g (11/12/17 0517)  . vancomycin Stopped (11/11/17 1433)     LOS: 4 days   [place] [facility] Pager [phone]  *Please refer to [email or web address], password [email or web address] to get updated schedule on who will round on this patient, as hospitalists switch teams weekly. If 7PM-7AM, please contact night-coverage at [email or web address], password [email or web address] for any overnight needs.  11/12/2017, 9:26 AM

## 2017-11-12 NOTE — NC FL2 (Signed)
[place] LEVEL OF CARE SCREENING TOOL     IDENTIFICATION  Patient Name: [name] [name] Birthdate: [birth year] Sex: male Admission Date (Current Location): 10/31/2017  [facility] and [place] Number:  Herbalist and Address:  [facility] [place] [place] [facility], [place] [address], [place], [place] [address]      Provider Number: [id number]  Attending Physician Name and Address:  Charlynne Cousins, MD  Relative Name and Phone Number:  Sunday Spillers (spouse) [phone]    Current Level of Care: Hospital Recommended Level of Care: [place] Prior Approval Number:    Date Approved/Denied:   PASRR Number: [id number]  Discharge Plan: SNF    Current Diagnoses: Patient Active Problem List   Diagnosis Date Noted  . Periprosthetic fracture around internal prosthetic hip joint   . Palliative care encounter   . Closed right hip fracture, initial encounter ([place]) 11/09/2017  . Dyslipidemia 08/31/2017  . Chronic gouty arthropathy- L 1st toe 06/09/2017  . Bunion of great toe of left foot 06/09/2017  . Hallux valgus (acquired), left foot 06/09/2017  . Toenail fungus 06/09/2017  . Anemia 04/07/2017  . Chronic diastolic CHF (congestive heart failure) ([place]) 04/01/2017  . Hypertension associated with diabetes ([place]) 04/01/2017  . Mixed diabetic hyperlipidemia associated with type 2 diabetes mellitus ([place]) 04/01/2017  . Fatigue 04/01/2017  . History of smoking 30 or more pack years 04/01/2017  . S/P hernia repair 07/27/2016  . Preop cardiovascular exam 05/30/2016  . S/P colostomy takedown 11/05/2015  . Abdominal pain   . Agitation   . Threatening to others   . Diverticulitis of large intestine with perforation without bleeding   . Type 2 diabetes mellitus with complication ([place] )   . CAD in native artery   . Diverticulitis 04/26/2015  . Perforated diverticulum of large intestine 04/26/2015  . Diverticulitis of colon with perforation 04/26/2015  . Mood  disorder ([place])- mixed PTSD and depression 04/26/2015  . GERD (gastroesophageal reflux disease) 04/26/2015  . Hypothyroidism 04/26/2015  . Acute encephalopathy 08/25/2014  . Acute on chronic renal failure ([place]) 08/25/2014  . Blood poisoning   . Elevated troponin   . CAP (community acquired pneumonia) 08/24/2014  . Sepsis ([place]) 08/24/2014  . Hypokalemia 08/24/2014  . AKI (acute kidney injury) ([place]) 08/24/2014  . Alcohol abuse 08/24/2014  . DM (diabetes mellitus), type 2 ([place]) 02/28/2013  . Abnormal EKG 02/28/2013  . Fist[place]in-[place] 11/05/2011  . Hyperlipidemia 02/06/2011  . Insomnia 02/06/2011  . Hypertension   . CAD (coronary artery disease)     Orientation RESPIRATION BLADDER Height & Weight     Self  O2(nasal cannula 2L/m) Incontinent Weight: 230 lb (104.3 kg) Height:  5\' 10"  (177.8 cm)  BEHAVIORAL SYMPTOMS/MOOD NEUROLOGICAL BOWEL NUTRITION STATUS      Continent Diet(see discharge summary)  AMBULATORY STATUS COMMUNICATION OF NEEDS Skin   Extensive Assist Verbally Surgical wounds(right hip closed surgical incision)                       Personal Care Assistance Level of Assistance  Bathing, Feeding, Dressing, Total care Bathing Assistance: Maximum assistance Feeding assistance: Limited assistance Dressing Assistance: Maximum assistance Total Care Assistance: Maximum assistance   Functional Limitations Info  Sight, Hearing, Speech Sight Info: Adequate Hearing Info: Adequate Speech Info: Adequate    SPECIAL CARE FACTORS FREQUENCY  PT (By licensed PT), OT (By licensed OT)     PT Frequency: 5x weekly OT Frequency: 5x weekly  Contractures Contractures Info: Not present    Additional Factors Info  Code Status, Allergies Code Status Info: DNR Allergies Info: Allergies:  Ciprofloxacin Hcl, Fentanyl, Flomax Tamsulosin Hcl, Ace Inhibitors           Current Medications (11/12/2017):  This is the current hospital active medication list Current  Facility-Administered Medications  Medication Dose Route Frequency Provider Last Rate Last Dose  . 0.9 %  sodium chloride infusion (Manually program via Guardrails IV Fluids)   Intravenous Once Blount, Scarlette Shorts T, NP      . 0.9 %  sodium chloride infusion (Manually program via Guardrails IV Fluids)   Intravenous Once Aileen Fass, Tammi Klippel, MD      . acetaminophen (TYLENOL) tablet 325-650 mg  325-650 mg Oral Q6H PRN Leighton Parody, PA-C      . allopurinol (ZYLOPRIM) tablet 300 mg  300 mg Oral QHS Leighton Parody, PA-C   300 mg at 11/11/17 2151  . ampicillin-sulbactam (UNASYN) 1.5 g in sodium chloride 0.9 % 100 mL IVPB  1.5 g Intravenous Q6H Charlynne Cousins, MD 200 mL/hr at 11/12/17 0517 1.5 g at 11/12/17 0517  . aspirin EC tablet 81 mg  81 mg Oral BID WC Leighton Parody, PA-C   81 mg at 11/12/17 7026  . bisacodyl (DULCOLAX) EC tablet 5 mg  5 mg Oral Daily PRN Leighton Parody, PA-C      . clonazePAM Bobbye [place]) tablet 1 mg  1 mg Oral Daily Charlynne Cousins, MD   1 mg at 11/12/17 3785   And  . clonazePAM (KLONOPIN) tablet 2 mg  2 mg Oral Q supper Charlynne Cousins, MD   2 mg at 11/11/17 1711  . diphenhydrAMINE (BENADRYL) injection 25 mg  25 mg Intravenous Q6H PRN Leighton Parody, PA-C      . docusate sodium (COLACE) capsule 100 mg  100 mg Oral BID Leighton Parody, PA-C   100 mg at 11/11/17 2149  . gabapentin (NEURONTIN) capsule 900 mg  900 mg Oral QHS Leighton Parody, PA-C   900 mg at 11/11/17 2150  . guaiFENesin (MUCINEX) 12 hr tablet 1,200 mg  1,200 mg Oral BID PRN Leighton Parody, PA-C      . haloperidol lactate (HALDOL) injection 1 mg  1 mg Intravenous Q6H PRN Leighton Parody, PA-C   1 mg at 11/11/17 0341  . hydrALAZINE (APRESOLINE) injection 10 mg  10 mg Intravenous Q4H PRN Leighton Parody, PA-C      . HYDROcodone-acetaminophen (NORCO) 10-325 MG per tablet 1 tablet  1 tablet Oral QID Dellinger, Marianne L, PA-C   1 tablet at 11/12/17 0916  . HYDROcodone-acetaminophen (NORCO)  10-325 MG per tablet 1 tablet  1 tablet Oral Q8H PRN Dellinger, Bobby Rumpf, PA-C   1 tablet at 11/12/17 0445  . insulin aspart (novoLOG) injection 0-9 Units  0-9 Units Subcutaneous Q4H Leighton Parody, PA-C   1 Units at 11/12/17 8850  . lactose free nutrition (BOOST PLUS) liquid 237 mL  237 mL Oral TID BM Joanell Rising K, PA-C   237 mL at 11/12/17 0920  . levothyroxine (SYNTHROID, LEVOTHROID) tablet 75 mcg  75 mcg Oral QAC breakfast Leighton Parody, PA-C   75 mcg at 11/12/17 2774  . magnesium oxide (MAG-OX) tablet 400 mg  400 mg Oral BID Charlynne Cousins, MD   400 mg at 11/12/17 1037  . menthol-cetylpyridinium (CEPACOL) lozenge 3 mg  1 lozenge Oral PRN Leighton Parody, PA-C  Or  . phenol (CHLORASEPTIC) mouth spray 1 spray  1 spray Mouth/Throat PRN Leighton Parody, PA-C      . metoCLOPramide (REGLAN) tablet 5-10 mg  5-10 mg Oral Q8H PRN Leighton Parody, PA-C       Or  . metoCLOPramide (REGLAN) injection 5-10 mg  5-10 mg Intravenous Q8H PRN Leighton Parody, PA-C      . metoprolol tartrate (LOPRESSOR) tablet 100 mg  100 mg Oral BID Leighton Parody, PA-C   100 mg at 11/12/17 0917  . morphine 2 MG/ML injection 0.5-1 mg  0.5-1 mg Intravenous Q2H PRN Leighton Parody, PA-C   1 mg at 11/11/17 1713  . ondansetron (ZOFRAN) injection 4 mg  4 mg Intravenous Q6H PRN Leighton Parody, PA-C      . pantoprazole (PROTONIX) EC tablet 40 mg  40 mg Oral Daily Leighton Parody, PA-C   40 mg at 11/12/17 0917  . polyethylene glycol (MIRALAX / GLYCOLAX) packet 17 g  17 g Oral Daily Leighton Parody, PA-C   17 g at 11/11/17 0820  . polyvinyl alcohol (LIQUIFILM TEARS) 1.4 % ophthalmic solution   Both Eyes TID PRN Leighton Parody, PA-C      . QUEtiapine (SEROQUEL) tablet 25 mg  25 mg Oral QHS Charlynne Cousins, MD   25 mg at 11/11/17 2148  . rOPINIRole (REQUIP) tablet 0.5 mg  0.5 mg Oral QHS Leighton Parody, PA-C   0.5 mg at 11/11/17 2149  . senna-docusate (Senokot-S) tablet 2 tablet  2 tablet Oral QHS  Leighton Parody, PA-C   2 tablet at 11/11/17 2151  . vancomycin (VANCOCIN) IVPB 750 mg/150 ml premix  750 mg Intravenous Q12H Franky Macho, RPH      . venlafaxine XR (EFFEXOR-XR) 24 hr capsule 225 mg  225 mg Oral QHS Leighton Parody, PA-C   225 mg at 11/11/17 2149     Discharge Medications: Please see discharge summary for a list of discharge medications.  Relevant Imaging Results:  Relevant Lab Results:   Additional Information SSN: [id number]  Alberteen Sam, LCSW

## 2017-11-12 NOTE — Progress Notes (Signed)
Pt has critical lab value,  Hg 6.5. Provider notified. New orders received.

## 2017-11-12 NOTE — Progress Notes (Signed)
Responded to spiritual care request. Patient was engaging and reflective, and shared the story of his fall.  Two days after surgery he was still groggy, but clear he did not want his care to be a burden to his wife who is an Therapist, sports.  Patient said he was receiving visitors from his [place] but he welcomed my prayer.  Chaplain will refer pt to palliative care chaplain. Rev. Tamsen Snider [phone]

## 2017-11-12 NOTE — Progress Notes (Signed)
PATIENT ID: [name] [name]  MRN: [id number]  DOB/AGE:  1945/04/09 / 72 y.o.  2 Days Post-Op Procedure(s) (LRB): OPEN REDUCTION INTERNAL FIXATION (ORIF) PERIPROSTHETIC FRACTURE (Right)    PROGRESS NOTE Subjective: Patient is alert, oriented, no Nausea, no Vomiting, yes passing gas, . Taking PO well with pt advancing diet. Denies SOB, Chest or Calf Pain. Using Incentive Spirometer, PAS in place. Ambulate touch toe weight bearing with assitance Patient reports pain as  Moderate to severe .    Objective: Vital signs in last 24 hours: Vitals:   11/11/17 1922 11/12/17 0000 11/12/17 0506 11/12/17 0657  BP: 129/67  111/65   Pulse: 100  96   Resp: 18  16   Temp: (!) 97.5 F (36.4 C) 97.6 F (36.4 C) 97.8 F (36.6 C) 97.7 F (36.5 C)  TempSrc: Oral Axillary Oral Axillary  SpO2: 99%  100%   Weight:      Height:          Intake/Output from previous day: I/O last 3 completed shifts: In: 2939.9 [P.O.:120; I.V.:2219.9; IV Piggyback:600] Out: 1600 [Urine:900; Blood:700]   Intake/Output this shift: No intake/output data recorded.   LABORATORY DATA: Recent Labs    11/11/17 0509  11/11/17 2059 11/12/17 0018 11/12/17 0443 11/12/17 0509  WBC 22.5*  --   --   --  18.8*  --   HGB 8.1*  --   --   --  6.5*  --   HCT 26.0*  --   --   --  20.5*  --   PLT 236  --   --   --  220  --   NA 133*  --   --   --  133*  --   K 3.8  --   --   --  3.8  --   CL 100  --   --   --  101  --   CO2 21*  --   --   --  24  --   BUN 14  --   --   --  13  --   CREATININE 1.50*  --   --   --  1.19  --   GLUCOSE 209*  --   --   --  148*  --   GLUCAP  --    < > 121* 171*  --  128*  CALCIUM 8.0*  --   --   --  7.8*  --    < > = values in this interval not displayed.    Examination: Neurologically intact Neurovascular intact Sensation intact distally Intact pulses distally Dorsiflexion/Plantar flexion intact Incision: scant drainage No cellulitis present Compartment soft} XR AP&Lat of hip shows well  placed\fixed THA  Assessment:   2 Days Post-Op Procedure(s) (LRB): OPEN REDUCTION INTERNAL FIXATION (ORIF) PERIPROSTHETIC FRACTURE (Right) ADDITIONAL DIAGNOSIS:  Expected Acute Blood Loss Anemia, Acute Blood Loss Anemia, Diabetes and Congestive heart failure, chronic pain management, grade 3 renal failure, chronic,Status post colostomy takedown last year, postoperative psychoses that usually last about a week after his surgeries.  Plan: PT/OT WBAT, THA  DVT Prophylaxis: SCDx72 hrs, ASA 81 mg BID x 2 weeks  DISCHARGE PLAN: Skilled Nursing Facility/Rehab  DISCHARGE NEEDS: HHPT, Walker and 3-in-1 comode seat   Hgb down to 6.5.  Looks like orders for 1 unit of blood from medicine.

## 2017-11-12 NOTE — Clinical Social Work Note (Signed)
Clinical Social Work Assessment  Patient Details  Name: [name] [name] MRN: [id number] Date of Birth: [birth year]  Date of referral:  11/12/17               Reason for consult:  Care Management Concerns, Discharge Planning                Permission sought to share information with:  Case Manager, Facility Contact Representative Permission granted to share information::  Yes, Verbal Permission Granted  Name::     Sunday Spillers  Agency::  [facility]  Relationship::  Spouse  Contact Information:  [phone]  Housing/Transportation Living arrangements for the past 2 months:  Single Family Home Source of Information:  Spouse Patient Interpreter Needed:  None Criminal Activity/Legal Involvement Pertinent to Current Situation/Hospitalization:  No - Comment as needed Significant Relationships:  Spouse Lives with:  Spouse Do you feel safe going back to the place where you live?  No Need for family participation in patient care:  Yes (Comment)  Care giving concerns:  CSW received referral for possible SNF placement at time of discharge. Spoke with patient and wife regarding possibility of SNF placement . Patient's wife is currently unable to care for her at their home given patient's current needs and fall risk.  Patient and  Wife expressed understanding of PT recommendation and are agreeable to SNF placement at time of discharge. CSW to continue to follow and assist with discharge planning needs.     Social Worker assessment / plan:  Spoke with patient and  Wife concerning possibility of rehab at SNF before returning home.    Employment status:  Retired Nurse, adult PT Recommendations:  [place] / Referral to community resources:  [place]  Patient/Family's Response to care:  Patient and wife  recognize need for rehab before returning home and are agreeable to a SNF. Their top three choices are [facility], [facility], and [facility] in  that order . CSW explained insurance authorization process. Patient's family reported that they want patient to get stronger to be able to come back home.    Patient/Family's Understanding of and Emotional Response to Diagnosis, Current Treatment, and Prognosis:  Patient/family is realistic regarding therapy needs and expressed being hopeful for SNF placement. Patient expressed understanding of CSW role and discharge process as well as medical condition. No questions/concerns about plan or treatment.    Emotional Assessment Appearance:  Appears stated age Attitude/Demeanor/Rapport:  Gracious Affect (typically observed):  Accepting Orientation:  Oriented to Self Alcohol / Substance use:  Not Applicable Psych involvement (Current and /or in the community):  No (Comment)  Discharge Needs  Concerns to be addressed:  Discharge Planning Concerns, Care Coordination Readmission within the last 30 days:  No Current discharge risk:  Dependent with Mobility Barriers to Discharge:  Continued Medical Work up   [employer], LCSW 11/12/2017, 11:53 AM

## 2017-11-12 NOTE — Progress Notes (Deleted)
CSW consulted for SNF recommendation. CSW informed patient of recommendation and patient reports she is going home. Patient reports she will be getting assistance from her cousin at home and has plenty of equipment at home from last hip break including walker, cane, shower equipment, and bedside toilet.   CSW notified RNCM of home health.   CSW signing off.   [place], [place]

## 2017-11-13 ENCOUNTER — Inpatient Hospital Stay (HOSPITAL_COMMUNITY): Payer: Medicare Other

## 2017-11-13 LAB — CBC
HCT: 24.6 % — ABNORMAL LOW (ref 39.0–52.0)
HCT: 26.1 % — ABNORMAL LOW (ref 39.0–52.0)
HEMOGLOBIN: 7.8 g/dL — AB (ref 13.0–17.0)
HEMOGLOBIN: 8.5 g/dL — AB (ref 13.0–17.0)
MCH: 28.6 pg (ref 26.0–34.0)
MCH: 28.9 pg (ref 26.0–34.0)
MCHC: 31.7 g/dL (ref 30.0–36.0)
MCHC: 32.6 g/dL (ref 30.0–36.0)
MCV: 90.1 fL (ref 78.0–100.0)
MCV: 88.8 fL (ref 78.0–100.0)
Platelets: 218 10*3/uL (ref 150–400)
Platelets: 210 10*3/uL (ref 150–400)
RBC: 2.73 MIL/uL — ABNORMAL LOW (ref 4.22–5.81)
RBC: 2.94 MIL/uL — ABNORMAL LOW (ref 4.22–5.81)
RDW: 14.5 % (ref 11.5–15.5)
RDW: 15 % (ref 11.5–15.5)
WBC: 17 10*3/uL — AB (ref 4.0–10.5)
WBC: 18.1 10*3/uL — AB (ref 4.0–10.5)

## 2017-11-13 LAB — TYPE AND SCREEN
ABO/RH(D): O POS
ANTIBODY SCREEN: NEGATIVE
UNIT DIVISION: 0
UNIT DIVISION: 0
Unit division: 0

## 2017-11-13 LAB — BPAM RBC
BLOOD PRODUCT EXPIRATION DATE: 201910212359
Blood Product Expiration Date: 201910202359
Blood Product Expiration Date: 201910212359
ISSUE DATE / TIME: 201909271543
ISSUE DATE / TIME: 201909271044
ISSUE DATE / TIME: 201909280424
UNIT TYPE AND RH: 5100
UNIT TYPE AND RH: 5100
Unit Type and Rh: 5100

## 2017-11-13 LAB — GLUCOSE, CAPILLARY
GLUCOSE-CAPILLARY: 123 mg/dL — AB (ref 70–99)
GLUCOSE-CAPILLARY: 129 mg/dL — AB (ref 70–99)
GLUCOSE-CAPILLARY: 134 mg/dL — AB (ref 70–99)
GLUCOSE-CAPILLARY: 134 mg/dL — AB (ref 70–99)
GLUCOSE-CAPILLARY: 137 mg/dL — AB (ref 70–99)
GLUCOSE-CAPILLARY: 175 mg/dL — AB (ref 70–99)
Glucose-Capillary: 136 mg/dL — ABNORMAL HIGH (ref 70–99)

## 2017-11-13 LAB — BASIC METABOLIC PANEL
ANION GAP: 8 (ref 5–15)
BUN: 15 mg/dL (ref 8–23)
CALCIUM: 8 mg/dL — AB (ref 8.9–10.3)
CO2: 27 mmol/L (ref 22–32)
Chloride: 99 mmol/L (ref 98–111)
Creatinine, Ser: 1.18 mg/dL (ref 0.61–1.24)
GFR calc non Af Amer: 60 mL/min — ABNORMAL LOW (ref >60)
Glucose, Bld: 130 mg/dL — ABNORMAL HIGH (ref 70–99)
Potassium: 3.7 mmol/L (ref 3.5–5.1)
SODIUM: 134 mmol/L — AB (ref 135–145)

## 2017-11-13 LAB — PREPARE RBC (CROSSMATCH)

## 2017-11-13 MED ORDER — CLONAZEPAM 0.25 MG PO TBDP: 0.5000 mg | ORAL_TABLET | Freq: Three times a day (TID) | ORAL | Status: DC | PRN

## 2017-11-13 MED ORDER — SIMETHICONE 80 MG PO CHEW
80.0000 mg | CHEWABLE_TABLET | Freq: Once | ORAL | Status: AC
  Administered 2017-11-13: 80 mg via ORAL
  Filled 2017-11-13: qty 1

## 2017-11-13 MED ORDER — SODIUM CHLORIDE 0.9% IV SOLUTION
Freq: Once | INTRAVENOUS | Status: AC
  Administered 2017-11-13: 09:00:00 via INTRAVENOUS

## 2017-11-13 MED ORDER — FUROSEMIDE 10 MG/ML IJ SOLN
40.0000 mg | Freq: Two times a day (BID) | INTRAMUSCULAR | Status: DC
  Administered 2017-11-13 – 2017-11-14 (×2): 40 mg via INTRAVENOUS
  Filled 2017-11-13 (×2): qty 4

## 2017-11-13 MED ORDER — CLONAZEPAM 0.25 MG PO TBDP
1.0000 mg | ORAL_TABLET | Freq: Three times a day (TID) | ORAL | Status: DC | PRN
  Administered 2017-11-13 – 2017-11-14 (×3): 1 mg via ORAL
  Filled 2017-11-13 (×3): qty 4

## 2017-11-13 MED ORDER — POTASSIUM CHLORIDE CRYS ER 20 MEQ PO TBCR
40.0000 meq | EXTENDED_RELEASE_TABLET | Freq: Two times a day (BID) | ORAL | Status: AC
  Administered 2017-11-13 (×2): 40 meq via ORAL
  Filled 2017-11-13 (×2): qty 2

## 2017-11-13 MED ORDER — ALBUTEROL SULFATE (2.5 MG/3ML) 0.083% IN NEBU
2.5000 mg | INHALATION_SOLUTION | RESPIRATORY_TRACT | Status: DC | PRN
  Administered 2017-11-13 (×2): 2.5 mg via RESPIRATORY_TRACT
  Filled 2017-11-13 (×2): qty 3

## 2017-11-13 MED ORDER — MAGNESIUM OXIDE 400 (241.3 MG) MG PO TABS
400.0000 mg | ORAL_TABLET | Freq: Three times a day (TID) | ORAL | Status: AC
  Administered 2017-11-13 (×3): 400 mg via ORAL
  Filled 2017-11-13 (×3): qty 1

## 2017-11-13 MED ORDER — AMOXICILLIN-POT CLAVULANATE 875-125 MG PO TABS
1.0000 | ORAL_TABLET | Freq: Two times a day (BID) | ORAL | Status: DC
  Administered 2017-11-13 – 2017-11-14 (×2): 1 via ORAL
  Filled 2017-11-13 (×2): qty 1

## 2017-11-13 MED ORDER — FUROSEMIDE 10 MG/ML IJ SOLN
40.0000 mg | Freq: Once | INTRAMUSCULAR | Status: AC
  Administered 2017-11-13: 40 mg via INTRAVENOUS
  Filled 2017-11-13: qty 4

## 2017-11-13 NOTE — Progress Notes (Signed)
   PATIENT ID: [name]   3 Days Post-Op Procedure(s) (LRB): OPEN REDUCTION INTERNAL FIXATION (ORIF) PERIPROSTHETIC FRACTURE (Right)  Subjective: Pain moderate in right leg. States hes "miserable" in the hospital in general. Denies SOB, Chest or Calf Pain. Using Incentive Spirometer, PAS in place. Ambulate touch toe weight bearing with assitance   Objective:  Vitals:   11/12/17 2003 11/13/17 0343  BP: 119/72 109/75  Pulse: 91 80  Resp:    Temp: 99.8 F (37.7 C) 99.3 F (37.4 C)  SpO2: 100% 100%     Neurologically intact Neurovascular intact Sensation intact distally Intact pulses distally Dorsiflexion/Plantar flexion intact Incision: scant drainage No cellulitis present Compartment soft  Labs:  Recent Labs    10/29/2017 1949 11/11/17 0509 11/12/17 0443 11/12/17 2040 11/13/17 0417  HGB 10.9* 8.1* 6.5* 7.6* 7.8*   Recent Labs    11/12/17 0443 11/12/17 2040 11/13/17 0417  WBC 18.8*  --  17.0*  RBC 2.27*  --  2.73*  HCT 20.5* 23.7* 24.6*  PLT 220  --  218   Recent Labs    11/12/17 0443 11/13/17 0417  NA 133* 134*  K 3.8 3.7  CL 101 99  CO2 24 27  BUN 13 15  CREATININE 1.19 1.18  GLUCOSE 148* 130*  CALCIUM 7.8* 8.0*     Assessment:   2 Days Post-Op Procedure(s) (LRB): OPEN REDUCTION INTERNAL FIXATION (ORIF) PERIPROSTHETIC FRACTURE (Right) ADDITIONAL DIAGNOSIS:  Expected Acute Blood Loss Anemia, Acute Blood Loss Anemia, Diabetes and Congestive heart failure, chronic pain management, grade 3 renal failure, chronic,Status post colostomy takedown last year, postoperative psychoses that usually last about a week after his surgeries.  Plan: PT/OT WBAT, THA Toe touch weightbearing  DVT Prophylaxis: SCDx72 hrs, ASA 81 mg BID x 2 weeks  DISCHARGE PLAN: Skilled Nursing Facility/Rehab, when cleared by medicine  DISCHARGE NEEDS: HHPT, Walker and 3-in-1 comode seat

## 2017-11-13 NOTE — Progress Notes (Signed)
Spoke with Eyvonne Mechanic patient wife and informed her of the medication that I have given and patient behavior. She stated that she understands and that she would like to speak with the Orthopedic MD to discuss pain management. I informed her that I placed a note in front of the chart. For MD. Arthor Captain LPN

## 2017-11-13 NOTE — Progress Notes (Signed)
RN administered lasix, and gave patient breathing treatment pt still having trouble breathing. RN called rapid to come lay eyes on him. Respiratory was in ED and will come up after.

## 2017-11-13 NOTE — Evaluation (Signed)
Clinical/Bedside Swallow Evaluation Patient Details  Name: [name] [name] MRN: [id number] Date of Birth: 01-01-1946  Today's Date: 11/13/2017 Time: SLP Start Time (ACUTE ONLY): 32 SLP Stop Time (ACUTE ONLY): 1602 SLP Time Calculation (min) (ACUTE ONLY): 12 min  Past Medical History:  Past Medical History:  Diagnosis Date  . Anal fistula   . Anemia   . Anxiety    panic attacks  . Arthritis   . CAD (coronary artery disease)    a. s/p CABG 2001; b. LHC (3/09):  S-RCA, L-LAD, S-OM1/dCFX all patent, EF 55-60%;  c. myoview (12/12):  no ischemia, EF 68%;  d. Dob Echo (1/14):  + ECG changes but normal echo => med Rx cont'd  . Cancer [facility])    pt denies  . CHF (congestive heart failure) (HCC)    EF 55%  . Complication of anesthesia    CONFUSION, psychosis after surgery for a week  . DDD (degenerative disc disease), lumbar    cervical thoracic and lumbar  . Diabetes mellitus without complication ([place])   . Diverticulitis   . GERD (gastroesophageal reflux disease)   . Gout   . History of pneumonia    septic pneumonia  . Hyperlipidemia   . Hypertension   . Hypothyroidism   . IBS (irritable bowel syndrome)   . Osteoporosis   . PTSD (post-traumatic stress disorder)   . Scoliosis   . Spinal stenosis    Past Surgical History:  Past Surgical History:  Procedure Laterality Date  . APPENDECTOMY    . BLEPHAROPLASTY Bilateral   . CERVICAL FUSION     times 2  . CHOLECYSTECTOMY    . COLOSTOMY REVERSAL  11/05/2015  . COLOSTOMY TAKEDOWN N/A 11/05/2015   Procedure: COLOSTOMY TAKEDOWN;  Surgeon: Georganna Skeans, MD;  Location: [place];  Service: General;  Laterality: N/A;  . CORONARY ARTERY BYPASS GRAFT     2001 LIMA LAD, SVG OM/Circ dista, SVG PDA.  Cath 2009 with patent grafts  . HAND SURGERY Right   . HIP ARTHROPLASTY Right 03/01/2013   Procedure: ARTHROPLASTY BIPOLAR HIP;  Surgeon: Alta Corning, MD;  Location: [facility];  Service: Orthopedics;  Laterality: Right;  . INCISIONAL HERNIA REPAIR  N/A 11/05/2015   Procedure: REPAIR INCISIONAL HERNIA;  Surgeon: Georganna Skeans, MD;  Location: [place];  Service: General;  Laterality: N/A;  . INCISIONAL HERNIA REPAIR N/A 07/27/2016   Procedure: HERNIA REPAIR INCISIONAL;  Surgeon: Ralene Ok, MD;  Location: [place];  Service: General;  Laterality: N/A;  . INSERTION OF MESH N/A 07/27/2016   Procedure: INSERTION OF MESH;  Surgeon: Ralene Ok, MD;  Location: [place];  Service: General;  Laterality: N/A;  . JOINT REPLACEMENT     Right knee  . KNEE ARTHROSCOPY Bilateral   . LAPAROTOMY N/A 07/27/2016   Procedure: EXPLORATORY LAPAROTOMY;  Surgeon: Ralene Ok, MD;  Location: [place];  Service: General;  Laterality: N/A;  . LYSIS OF ADHESION N/A 07/27/2016   Procedure: LYSIS OF ADHESION;  Surgeon: Ralene Ok, MD;  [facility];  Service: General;  Laterality: N/A;  . ORIF PERIPROSTHETIC FRACTURE Right 10/18/2017   Procedure: OPEN REDUCTION INTERNAL FIXATION (ORIF) PERIPROSTHETIC FRACTURE;  Surgeon: Frederik Pear, MD;  Location: [place];  Service: Orthopedics;  Laterality: Right;  . PARTIAL COLECTOMY N/A 04/26/2015   Procedure: EXPLORATORY LAPAROTOMY PARTIAL COLECTOMY WITH COLOSTOMY AND HARTMANN'S;  Surgeon: Georganna Skeans, MD;  Location: [place];  Service: General;  Laterality: N/A;  . PLACEMENT OF SETON     seton  stitch  . STERNAL WIRE REMOVAL    . TREATMENT FISTULA ANAL     times 3   HPI:  [name] [name] is an 72 y.o. male past medical history of diabetes mellitus type 2, essential hypertension anxiety chronic narcotic use, CAD status post CABG in 0086, chronic diastolic heart failure,PTSD, anxiety, hypothyroidism presents with right periprosthetic hip fracture following a mechanical fall.  Now s/p Open reduction internal fixation of right midshaft femur periprosthetic fracture, revision of hemi-hip arthroplasty. Intubated for procedure.   Assessment / Plan / Recommendation Clinical Impression   Pt presents with oropharyngeal swallow  which appears within functional limits. He has expiratory wheezes at baseline; these do not increase with presentation of POs, however pt unable (appears cognitive) to complete 3 oz water swallow challenge. Nonetheless, he takes large cup and straw sips without overt signs of aspiration. Intake is rapid when self-feeding purees, however airway protection appears adequate. Pt took small bites of graham cracker, alternating with sips of liquid, with appearance of adequate airway protection. Pt denies history of dysphagia. He is at mildly increased risk for aspiration given mentation (has been intermittently agitated) and respiratory status. Recommend he continue current diet (regular, thin liquids) with general aspiration precautions: upright positioning, slow rate, small bites and sips. Encouraged pt to take rest breaks if he feels short of breath when eating. Discussed recommendations with RN. No further skilled ST recommended at this time. SLP will s/o.    SLP Visit Diagnosis: Dysphagia, unspecified (R13.10)    Aspiration Risk  Mild aspiration risk    Diet Recommendation Regular;Thin liquid   Liquid Administration via: Cup;Straw Medication Administration: Whole meds with liquid Supervision: Patient able to self feed Compensations: Slow rate;Small sips/bites;Other (Comment)(rest breaks if short of breath)    Other  Recommendations Oral Care Recommendations: Oral care BID   Follow up Recommendations Skilled Nursing facility      Frequency and Duration            Prognosis Prognosis for Safe Diet Advancement: Good      Swallow Study   General Date of Onset: 10/17/2017 HPI: [name] [name] is an 72 y.o. male past medical history of diabetes mellitus type 2, essential hypertension anxiety chronic narcotic use, CAD status post CABG in 7619, chronic diastolic heart failure,PTSD, anxiety, hypothyroidism presents with right periprosthetic hip fracture following a mechanical fall.  Now s/p Open  reduction internal fixation of right midshaft femur periprosthetic fracture, revision of hemi-hip arthroplasty. Intubated for procedure. Type of Study: Bedside Swallow Evaluation Previous Swallow Assessment: none in chart Diet Prior to this Study: Regular;Thin liquids Temperature Spikes Noted: No Respiratory Status: Nasal cannula History of Recent Intubation: Yes Length of Intubations (days): (for procedure) Date extubated: 10/31/2017 Behavior/Cognition: Alert;Cooperative Oral Cavity Assessment: Within Functional Limits Oral Care Completed by SLP: No Oral Cavity - Dentition: Adequate natural dentition Vision: Functional for self-feeding Self-Feeding Abilities: Able to feed self Patient Positioning: Upright in bed Baseline Vocal Quality: Normal Volitional Cough: Strong Volitional Swallow: Able to elicit    Oral/Motor/Sensory Function Overall Oral Motor/Sensory Function: Within functional limits   Ice Chips Ice chips: Within functional limits Presentation: Spoon   Thin Liquid Thin Liquid: Within functional limits Presentation: Cup;Straw;Self Fed    Nectar Thick Nectar Thick Liquid: Not tested   Honey Thick Honey Thick Liquid: Not tested   Puree Puree: Within functional limits Presentation: Self Fed;Spoon   Solid     Solid: Within functional limits Presentation: [place]  Camie Patience, [place], [place] Pathologist Acute Rehabilitation Services Pager: [phone] Office: [phone]  Aliene Altes 11/13/2017,4:07 PM

## 2017-11-13 NOTE — Progress Notes (Signed)
Patient requested for his 10-325 mg of vicodin I saw that he has a scheduled dose at 1000 which the doses would be too close together. Patient was given 1mg  of IV Morphine I explained that he was getting morphine 1mg  for his pain. Patient then stated screaming that he wanted his clonazepam I told him that this is scheduled for 0800 I gave him 25mg  IV diphenhydramine and 650mg  of Tylenol po. Patient continued to scream I told him that the clonazepam is due at 0800 and that the earliest that I can give it to him is 0700. He continued to scream out and called 911 I spoke with operator that and told her he is at the hospital.. I gave him the clonazepam 1mg  the 0800 dose at 0558 he stated that he is to receive 2 pills I told him that this is the dose that is scheduled for 0800. That the 2 mg dose is at 1800. Patient still screaming he needs 2 mg dose. Will give Haldol 1mg . Arthor Captain LPN

## 2017-11-13 NOTE — Progress Notes (Addendum)
[facility] PROGRESS NOTE    Progress Note  [name] [name]  XNA:[id number] DOB: 11-19-1945 DOA: 10/23/2017 PCP: Mellody Dance, DO     Brief Narrative:   [name] [name] is an 72 y.o. male past medical history of diabetes mellitus type 2, essential hypertension anxiety chronic narcotic use, CAD status post CABG in 5427, chronic diastolic heart failure hypothyroidism presents with right hip fracture following a mechanical fall.  Assessment/Plan:   Closed right hip fracture, initial encounter [facility]): Due to mechanical fall. Right hemi-hip arthroplasty, on 10/17/2017. Orthopedic surgery recommended 50% weightbearing. Physical therapy evaluated the patient recommended skilled nursing facility.  Acute blood loss anemia: Likely due to surgical procedure after 2 units of packed red blood cells his hemoglobin is 7.6 due to his history of coronary artery disease we will try to keep it above 8 we will transfuse an additional unit of packed red blood cells give Lasix post transfusion  CAD S/p CABG: No anginal symptoms.  Chronic diastolic heart failure: Seems to be euvolemic.  Anxiety and depression: Continue Klonopin  Essential Hypertension: Continue metoprolol relatively well controlled.  Mood disorder ([place])- mixed PTSD and depression Continue current home medication no changes made.  Type 2 diabetes mellitus with complication [facility]): Hold oral hypoglycemic agents continue sliding scale insulin. Chronic diastolic CHF (congestive heart failure) (HCC)  Acute confusional state: Likely due to surgical intervention plus or minus anesthetics. Continue Seroquel at bedtime and Haldol IV as needed.  Lead EKG showed a QTC of 485 unchanged from previous Potassium at 4 magnesium greater than 2.  Continue replete potassium and magnesium orally recheck in the morning.  New acute kidney injury: Likely prerenal hold nephrotoxic medications. Resolved to baseline with IV fluid  hydration.  New onset fever probably due to aspiration pneumonia: We will transition her IV vancomycin and Unasyn to oral Augmentin. We will get a swallowing evaluation.  Goals of care: I had a long discussion with the husband and wife.  And they would like to proceed with hospice care.  The patient is tolerating his diet so his life expectancy is more than 2 weeks. They are wondering if the patient could go to rehab with hospice or palliative care.  I have consulted the palliative care team and have notified the social worker to investigate this further.  DVT prophylaxis: lovenox Family Communication:none Disposition Plan/Barrier to D/C: Skilled nursing facility  Code Status:     Code Status Orders  (From admission, onward)         Start     Ordered   11/09/17 1029  Do not attempt resuscitation (DNR)  Continuous    Question Answer Comment  In the event of cardiac or respiratory ARREST Do not call a "code blue"   In the event of cardiac or respiratory ARREST Do not perform Intubation, CPR, defibrillation or ACLS   In the event of cardiac or respiratory ARREST Use medication by any route, position, wound care, and other measures to relive pain and suffering. May use oxygen, suction and manual treatment of airway obstruction as needed for comfort.      11/09/17 1030        Code Status History    Date Active Date Inactive Code Status Order ID Comments User Context   11/09/2017 0110 11/09/2017 1030 Full Code [id number]  Vianne Bulls, MD Inpatient   07/27/2016 1240 08/01/2016 1902 Full Code [id number]  Ralene Ok, MD Inpatient   11/05/2015 1426 11/12/2015 1359 Full Code [id number]  Georganna Skeans,  MD Inpatient   04/26/2015 0205 05/06/2015 1501 Full Code [id number]  Gabriel Rainwater, RN Inpatient   08/24/2014 1601 08/27/2014 1714 Full Code [id number]  Caren Griffins, MD ED   01/01/2014 1216 01/01/2014 1806 Full Code [id number]  Linus Mako, PA-C ED   03/01/2013 1745 03/03/2013 1809 Full  Code [id number]  Penelope Coop Inpatient   02/28/2013 2255 03/01/2013 1745 Full Code [id number]  Annita Brod, MD Inpatient    Advance Directive Documentation     Most Recent Value  Type of Advance Directive  Healthcare Power of [place], Living will  Pre-existing out of facility DNR order (yellow form or pink MOST form)  -  "MOST" Form in Place?  -        IV Access:    Peripheral IV   Procedures and diagnostic studies:   Dg Chest Port 1 View  Result Date: 11/11/2017 CLINICAL DATA:  72 year old male with a history of fever EXAM: PORTABLE CHEST 1 VIEW COMPARISON:  11/09/2017 FINDINGS: Cardiomediastinal silhouette unchanged with cardiomegaly. No evidence of central vascular congestion. No large pleural effusion or pneumothorax. Similar appearance of elevation of left hemidiaphragm with retrocardiac opacity. Surgical changes of median sternotomy and CABG. IMPRESSION: Unchanged appearance of the chest x-ray with chronic lung changes and asymmetric elevation of left hemidiaphragm. If there is concern for left-sided pneumonia, a formal PA and lateral chest x-ray would be recommended. Surgical changes of median sternotomy and CABG. Electronically Signed   By: Corrie Mckusick D.O.   On: 11/11/2017 14:20     Medical Consultants:    None.  Anti-Infectives:   None  Subjective:    [name] mentation is much improved today.  Objective:    Vitals:   11/12/17 1600 11/12/17 1900 11/12/17 2003 11/13/17 0343  BP: 116/65 134/90 119/72 109/75  Pulse: (!) 122 (!) 128 91 80  Resp: (!) 21 (!) 22    Temp: 98.9 F (37.2 C) 98.6 F (37 C) 99.8 F (37.7 C) 99.3 F (37.4 C)  TempSrc: Oral Oral Oral Oral  SpO2: 100% 100% 100% 100%  Weight:      Height:        Intake/Output Summary (Last 24 hours) at 11/13/2017 0740 Last data filed at 11/13/2017 0600 Gross per 24 hour  Intake 1191 ml  Output 1700 ml  Net -509 ml   Filed Weights   11/13/2017 2229 10/28/2017 1341  Weight:  104.3 kg 104.3 kg    Exam: General exam: In no acute distress. Respiratory system: Good air movement and clear to auscultation. Cardiovascular system: S1 & S2 heard, RRR.  Gastrointestinal system: Abdomen is nondistended, soft and nontender.  Central nervous system:  No focal neurological deficits. Extremities: No pedal edema, with a retractor in place. Skin: No rashes, lesions or ulcers Psychiatry: He is able to carry on a conversation relate that he is having pain, but the pain medication seems to be controlling the pain. He would like to continue to work with physical therapy.    Data Reviewed:    Labs: Basic Metabolic Panel: Recent Labs  Lab 11/09/17 0355 10/17/2017 1949 11/11/17 0509 11/12/17 0443 11/13/17 0417  NA 135 138 133* 133* 134*  K 3.8 4.4 3.8 3.8 3.7  CL 98  --  100 101 99  CO2 29  --  21* 24 27  GLUCOSE 135* 143* 209* 148* 130*  BUN 10  --  14 13 15   CREATININE 1.16  --  1.50* 1.19 1.18  CALCIUM 8.6*  --  8.0* 7.8* 8.0*  MG  --   --   --  1.6*  --    GFR Estimated Creatinine Clearance: 68.4 mL/min (by C-G formula based on SCr of 1.18 mg/dL). Liver Function Tests: No results for input(s): AST, ALT, ALKPHOS, BILITOT, PROT, ALBUMIN in the last 168 hours. No results for input(s): LIPASE, AMYLASE in the last 168 hours. No results for input(s): AMMONIA in the last 168 hours. Coagulation profile No results for input(s): INR, PROTIME in the last 168 hours.  CBC: Recent Labs  Lab 11/09/17 0355 10/31/2017 1949 11/11/17 0509 11/12/17 0443 11/12/17 2040 11/13/17 0417  WBC 10.4  --  22.5* 18.8*  --  17.0*  HGB 11.4* 10.9* 8.1* 6.5* 7.6* 7.8*  HCT 36.8* 32.0* 26.0* 20.5* 23.7* 24.6*  MCV 88.9  --  91.2 90.3  --  90.1  PLT 251  --  236 220  --  218   Cardiac Enzymes: No results for input(s): CKTOTAL, CKMB, CKMBINDEX, TROPONINI in the last 168 hours. BNP (last 3 results) No results for input(s): PROBNP in the last 8760 hours. CBG: Recent Labs  Lab  11/12/17 1627 11/12/17 2058 11/13/17 0025 11/13/17 0347 11/13/17 0408  GLUCAP 131* 139* 175* 129* 134*   D-Dimer: No results for input(s): DDIMER in the last 72 hours. Hgb A1c: No results for input(s): HGBA1C in the last 72 hours. Lipid Profile: No results for input(s): CHOL, HDL, LDLCALC, TRIG, CHOLHDL, LDLDIRECT in the last 72 hours. Thyroid function studies: No results for input(s): TSH, T4TOTAL, T3FREE, THYROIDAB in the last 72 hours.  Invalid input(s): FREET3 Anemia work up: No results for input(s): VITAMINB12, FOLATE, FERRITIN, TIBC, IRON, RETICCTPCT in the last 72 hours. Sepsis Labs: Recent Labs  Lab 11/09/17 0355 11/11/17 0509 11/12/17 0443 11/13/17 0417  WBC 10.4 22.5* 18.8* 17.0*   Microbiology Recent Results (from the past 240 hour(s))  Surgical PCR screen     Status: None   Collection Time: 11/09/17  5:30 AM  Result Value Ref Range Status   MRSA, PCR NEGATIVE NEGATIVE Final   Staphylococcus aureus NEGATIVE NEGATIVE Final    Comment: (NOTE) The Xpert SA Assay (FDA approved for NASAL specimens in patients 24 years of age and older), is one component of a comprehensive surveillance program. It is not intended to diagnose infection nor to guide or monitor treatment. Performed at [facility], [place] [address] [place], [place] [address]   Culture, blood (Routine X 2) w Reflex to ID Panel     Status: None (Preliminary result)   Collection Time: 11/11/17 12:03 PM  Result Value Ref Range Status   Specimen Description BLOOD LEFT ARM  Final   Special Requests   Final    BOTTLES DRAWN AEROBIC AND ANAEROBIC Blood Culture adequate volume   Culture   Final    NO GROWTH 1 DAY Performed at [facility], [place] [address] [place],  [address]    Report Status PENDING  Incomplete  Culture, blood (Routine X 2) w Reflex to ID Panel     Status: None (Preliminary result)   Collection Time: 11/11/17 12:18 PM  Result Value Ref Range Status   Specimen  Description BLOOD LEFT ARM  Final   Special Requests   Final    BOTTLES DRAWN AEROBIC ONLY Blood Culture results may not be optimal due to an inadequate volume of blood received in culture bottles   Culture   Final    NO GROWTH  1 DAY Performed at [facility], [place] [address], [place], [place] [address]    Report Status PENDING  Incomplete     Medications:   . sodium chloride   Intravenous Once  . sodium chloride   Intravenous Once  . sodium chloride   Intravenous Once  . allopurinol  300 mg Oral QHS  . aspirin EC  81 mg Oral BID WC  . clonazePAM  1 mg Oral Daily   And  . clonazePAM  2 mg Oral Q supper  . docusate sodium  100 mg Oral BID  . furosemide  40 mg Intravenous Once  . gabapentin  900 mg Oral QHS  . HYDROcodone-acetaminophen  1 tablet Oral QID  . insulin aspart  0-9 Units Subcutaneous Q4H  . lactose free nutrition  237 mL Oral TID BM  . levothyroxine  75 mcg Oral QAC breakfast  . metoprolol tartrate  100 mg Oral BID  . pantoprazole  40 mg Oral Daily  . polyethylene glycol  17 g Oral Daily  . potassium chloride  40 mEq Oral BID  . QUEtiapine  25 mg Oral QHS  . rOPINIRole  0.5 mg Oral QHS  . senna-docusate  2 tablet Oral QHS  . venlafaxine XR  225 mg Oral QHS   Continuous Infusions: . ampicillin-sulbactam (UNASYN) IV 1.5 g (11/13/17 0115)  . vancomycin 750 mg (11/13/17 0421)     LOS: 5 days   [facility] Pager [phone]  *Please refer to [email or web address], password [email or web address] to get updated schedule on who will round on this patient, as hospitalists switch teams weekly. If 7PM-7AM, please contact night-coverage at [email or web address], password [email or web address] for any overnight needs.  11/13/2017, 7:40 AM

## 2017-11-13 NOTE — Progress Notes (Signed)
Pt wife wants to know if it is okay for pt/s prn klonopin can be bumped up to 1mg  which is what the patient takes at home. Dr. Rowe Pavy paged awaiting call back

## 2017-11-13 NOTE — Progress Notes (Signed)
Pt breathing increased and wheezing has become worse. RN paged hospitalist and got verbal order for albuterol neb treatment as well as chest Xray, and to give lasix now. Will continue to monitor
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
## 2017-11-13 NOTE — Progress Notes (Addendum)
Pt seen this morning around 1100 while rounding the floor. Call received at 1240 per floor RN regarding Pt with increased wheezes after blood transfusion. Pt found at bedside resting recent VSS. Lung sounds with expiratory wheezes and some congestion heard in bilateral bases left worse than right. 02 sats 100% on nasal canula 2 L. Pt does not appear to be in distress at this time. Pt awake responsive oriented to self location, at times confused on time and situation. Denies pain in left hip but states bilateral feet are "cramping" States that Klonopin usually helps at home when he is uncomfortable and having severe foot cramps. He also complains he is having trouble breathing and appears anxious at times. Klonopin 0.5mg   on MAR prn q8hr. Per Wife he takes 1 mg PRN at home and she and Patient request to increase his PRN dose. Floor RN advised to page Palliative medicine whom placed the original order to inquire on changing the dose. Also awaiting the results of CXR, Floor RN advised to page Hospitalist with CXR results when available to update. RRT team to follow Pt and RN advised to notify if further concerns.

## 2017-11-14 LAB — BASIC METABOLIC PANEL
Anion gap: 11 (ref 5–15)
BUN: 16 mg/dL (ref 8–23)
CALCIUM: 8 mg/dL — AB (ref 8.9–10.3)
CO2: 29 mmol/L (ref 22–32)
Chloride: 93 mmol/L — ABNORMAL LOW (ref 98–111)
Creatinine, Ser: 1.18 mg/dL (ref 0.61–1.24)
GFR calc Af Amer: >60 mL/min (ref >60)
GFR calc non Af Amer: 60 mL/min — ABNORMAL LOW (ref >60)
GLUCOSE: 157 mg/dL — AB (ref 70–99)
Potassium: 3.1 mmol/L — ABNORMAL LOW (ref 3.5–5.1)
Sodium: 133 mmol/L — ABNORMAL LOW (ref 135–145)

## 2017-11-14 LAB — BPAM RBC
BLOOD PRODUCT EXPIRATION DATE: 201910252359
ISSUE DATE / TIME: 201909280852
Unit Type and Rh: 5100

## 2017-11-14 LAB — GLUCOSE, CAPILLARY
GLUCOSE-CAPILLARY: 143 mg/dL — AB (ref 70–99)
GLUCOSE-CAPILLARY: 147 mg/dL — AB (ref 70–99)
GLUCOSE-CAPILLARY: 149 mg/dL — AB (ref 70–99)
GLUCOSE-CAPILLARY: 154 mg/dL — AB (ref 70–99)
Glucose-Capillary: 160 mg/dL — ABNORMAL HIGH (ref 70–99)

## 2017-11-14 LAB — TYPE AND SCREEN
ABO/RH(D): O POS
Antibody Screen: NEGATIVE
UNIT DIVISION: 0

## 2017-11-14 LAB — MAGNESIUM: Magnesium: 1.7 mg/dL (ref 1.7–2.4)

## 2017-11-14 MED ORDER — LORAZEPAM 2 MG/ML IJ SOLN
1.0000 mg | INTRAMUSCULAR | Status: DC | PRN
  Administered 2017-11-14 – 2017-11-16 (×5): 1 mg via INTRAVENOUS
  Filled 2017-11-14 (×6): qty 1

## 2017-11-14 MED ORDER — ENSURE ENLIVE PO LIQD
237.0000 mL | ORAL | Status: DC | PRN
  Administered 2017-11-14: 237 mL via ORAL
  Filled 2017-11-14: qty 237

## 2017-11-14 MED ORDER — ALUM & MAG HYDROXIDE-SIMETH 200-200-20 MG/5ML PO SUSP
30.0000 mL | Freq: Four times a day (QID) | ORAL | Status: DC | PRN
  Administered 2017-11-14: 30 mL via ORAL
  Filled 2017-11-14: qty 30

## 2017-11-14 MED ORDER — ALUM & MAG HYDROXIDE-SIMETH 200-200-20 MG/5ML PO SUSP
30.0000 mL | Freq: Once | ORAL | Status: AC
  Administered 2017-11-14: 30 mL via ORAL
  Filled 2017-11-14: qty 30

## 2017-11-14 MED ORDER — MORPHINE 100MG IN NS 100ML (1MG/ML) PREMIX INFUSION
1.0000 mg/h | INTRAVENOUS | Status: DC
  Administered 2017-11-14: 5 mg/h via INTRAVENOUS
  Filled 2017-11-14: qty 100

## 2017-11-14 MED ORDER — METOPROLOL TARTRATE 5 MG/5ML IV SOLN: 5.0000 mg | Freq: Four times a day (QID) | INTRAVENOUS | Status: DC | PRN

## 2017-11-14 MED ORDER — FUROSEMIDE 10 MG/ML IJ SOLN
40.0000 mg | Freq: Once | INTRAMUSCULAR | Status: AC
  Administered 2017-11-14: 40 mg via INTRAVENOUS
  Filled 2017-11-14: qty 4

## 2017-11-14 MED ORDER — POTASSIUM CHLORIDE CRYS ER 20 MEQ PO TBCR
40.0000 meq | EXTENDED_RELEASE_TABLET | Freq: Two times a day (BID) | ORAL | Status: DC
  Administered 2017-11-14: 40 meq via ORAL
  Filled 2017-11-14: qty 2

## 2017-11-14 MED ORDER — LEVALBUTEROL HCL 0.63 MG/3ML IN NEBU
0.6300 mg | INHALATION_SOLUTION | Freq: Once | RESPIRATORY_TRACT | Status: AC
  Administered 2017-11-14: 0.63 mg via RESPIRATORY_TRACT
  Filled 2017-11-14: qty 3

## 2017-11-14 NOTE — Progress Notes (Addendum)
On assessment patient's HR in 140's. Vitals stable, denies chest pain. MD Feliz-Ortiz made aware and at bedside. No new orders at this time. Pt given PO metoprolol and klonopin per order. Will continue to monitor closely.

## 2017-11-14 NOTE — Progress Notes (Addendum)
BP 115/86   Pulse (!) 131   Temp 98.6 F (37 C) (Oral)   Resp (!) 24   Ht 5\' 10"  (1.778 m)   Wt 104.3 kg   SpO2 99%   BMI 33.00 kg/m  Was called by the nurse as the patient became all of a sudden tachycardic. EKG showed  A fib with RVR. As speaking with wife she and her family are ready to proceed toward comfort care, as he seem to be getting and I quote "complication after complication". She relates that he is ready and that he has express not wanting to prolong the inevitable. She would like to stop all medication and labs. She would like to get him to residential hospice for comfort care.

## 2017-11-14 NOTE — Progress Notes (Signed)
[facility] PROGRESS NOTE    Progress Note  [name] [name]  LHT:[id number] DOB: 03/05/1945 DOA: 10/27/2017 PCP: [name] Dance, DO     Brief Narrative:   [name] [name] is an 72 y.o. male past medical history of diabetes mellitus type 2, essential hypertension anxiety chronic narcotic use, CAD status post CABG in 5726, chronic diastolic heart failure hypothyroidism presents with right hip fracture following a mechanical fall.  Assessment/Plan:   Closed right hip fracture, initial encounter [facility]): Due to mechanical fall. Right hemi-hip arthroplasty, on 11/11/2017. Orthopedic surgery recommended 50% weightbearing. Physical therapy evaluated the patient recommended skilled nursing facility. She has been waiting for 2 days for skilled nursing facility placement.  We will reconsult Education officer, museum. Try to call the social worker several times and have been unsuccessful.  Acute blood loss anemia: Status post 3 units of packed red blood cells hemoglobin has remained stable.  CAD S/p CABG: No anginal symptoms.  Chronic diastolic heart failure: Seems to be euvolemic.  Anxiety and depression: Continue Klonopin  Essential Hypertension: Continue metoprolol relatively well controlled.  Mood disorder ([place])- mixed PTSD and depression Continue current home medication no changes made.  Type 2 diabetes mellitus with complication [facility]): Hold oral hypoglycemic agents continue sliding scale insulin.  Acute confusional state: Likely due to surgical intervention plus or minus anesthetics. Continue Seroquel at bedtime and Haldol IV as needed.  Lead EKG showed a QTC of 485 unchanged from previous Potassium at 4 magnesium greater than 2.  Continue replete potassium and magnesium orally recheck in the morning.  New acute kidney injury: Likely prerenal hold nephrotoxic medications. Resolved to baseline with IV fluid hydration.  New onset fever probably due to aspiration  pneumonia: Swallowing evaluation continue oral Augmentin.  Goals of care: Appreciate hospice and palliative care assistance.  Hypokalemia: Replete potassium orally recheck in the morning.  DVT prophylaxis: lovenox Family Communication:none Disposition Plan/Barrier to D/C: Skilled nursing facility  Code Status:     Code Status Orders  (From admission, onward)         Start     Ordered   11/09/17 1029  Do not attempt resuscitation (DNR)  Continuous    Question Answer Comment  In the event of cardiac or respiratory ARREST Do not call a "code blue"   In the event of cardiac or respiratory ARREST Do not perform Intubation, CPR, defibrillation or ACLS   In the event of cardiac or respiratory ARREST Use medication by any route, position, wound care, and other measures to relive pain and suffering. May use oxygen, suction and manual treatment of airway obstruction as needed for comfort.      11/09/17 1030        Code Status History    Date Active Date Inactive Code Status Order ID Comments User Context   11/09/2017 0110 11/09/2017 1030 Full Code [id number]  [name] Bulls, MD Inpatient   07/27/2016 1240 08/01/2016 1902 Full Code [id number]  [name] Ok, MD Inpatient   11/05/2015 1426 11/12/2015 1359 Full Code [id number]  [name] Skeans, MD Inpatient   04/26/2015 0205 05/06/2015 1501 Full Code [id number]  [name] Rainwater, RN Inpatient   08/24/2014 0922 08/27/2014 1714 Full Code [id number]  Caren Griffins, MD ED   01/01/2014 1216 01/01/2014 1806 Full Code [id number]  Linus Mako, PA-C ED   03/01/2013 1745 03/03/2013 1809 Full Code [id number]  [name] Senters, PA-C Inpatient   02/28/2013 2255 03/01/2013 1745 Full Code [id number]  [name] Brod, MD Inpatient  Advance Directive Documentation     Most Recent Value  Type of Advance Directive  Healthcare Power of Attorney, Living will  Pre-existing out of facility DNR order (yellow form or pink MOST form)  -  "MOST" Form in Place?  -         IV Access:    Peripheral IV   Procedures and diagnostic studies:   Dg Chest Port 1 View  Result Date: 11/13/2017 CLINICAL DATA:  Wheezing, shortness of breath EXAM: PORTABLE CHEST 1 VIEW COMPARISON:  11/11/2017 FINDINGS: Bilateral mild interstitial thickening more prominent at the left lung base. Increased hazy airspace disease at the left lung base which may reflect atelectasis versus pneumonia. No pleural effusion or pneumothorax. Stable cardiomegaly. Prior CABG. No acute osseous abnormality. IMPRESSION: Cardiomegaly with mild pulmonary vascular congestion. Increased hazy airspace disease at the left lung base which may reflect atelectasis versus pneumonia. Electronically Signed   By: Kathreen Devoid   On: 11/13/2017 13:45     Medical Consultants:    None.  Anti-Infectives:   None  Subjective:    [name] [name] mentation is much improved today.  Objective:    Vitals:   11/13/17 1520 11/13/17 1948 11/14/17 0413 11/14/17 0732  BP: (!) 144/82 131/74 (!) 147/86   Pulse: 91 (!) 101 98   Resp:      Temp: 98.8 F (37.1 C) 99.2 F (37.3 C) 98.6 F (37 C)   TempSrc: Oral Axillary Axillary   SpO2: 99% 99% 100% 98%  Weight:      Height:        Intake/Output Summary (Last 24 hours) at 11/14/2017 0850 Last data filed at 11/14/2017 0421 Gross per 24 hour  Intake 472.08 ml  Output 3650 ml  Net -3177.92 ml   Filed Weights   10/30/2017 2229 10/21/2017 1341  Weight: 104.3 kg 104.3 kg    Exam: General exam: In no acute distress. Respiratory system: Good air movement and clear to auscultation. Cardiovascular system: S1 & S2 heard, RRR.  Gastrointestinal system: Abdomen is nondistended, soft and nontender.  Central nervous system:  No focal neurological deficits. Extremities: No pedal edema, with a retractor in place. Skin: No rashes, lesions or ulcers Psychiatry: He is able to carry on a conversation relate that he is having pain, but the pain medication seems to be  controlling the pain. He would like to continue to work with physical therapy.    Data Reviewed:    Labs: Basic Metabolic Panel: Recent Labs  Lab 11/09/17 0355 11/08/2017 1949 11/11/17 0509 11/12/17 0443 11/13/17 0417 11/14/17 0700  NA 135 138 133* 133* 134* 133*  K 3.8 4.4 3.8 3.8 3.7 3.1*  CL 98  --  100 101 99 93*  CO2 29  --  21* 24 27 29   GLUCOSE 135* 143* 209* 148* 130* 157*  BUN 10  --  14 13 15 16   CREATININE 1.16  --  1.50* 1.19 1.18 1.18  CALCIUM 8.6*  --  8.0* 7.8* 8.0* 8.0*  MG  --   --   --  1.6*  --  1.7   GFR Estimated Creatinine Clearance: 68.4 mL/min (by C-G formula based on SCr of 1.18 mg/dL). Liver Function Tests: No results for input(s): AST, ALT, ALKPHOS, BILITOT, PROT, ALBUMIN in the last 168 hours. No results for input(s): LIPASE, AMYLASE in the last 168 hours. No results for input(s): AMMONIA in the last 168 hours. Coagulation profile No results for input(s): INR, PROTIME in the last 168  hours.  CBC: Recent Labs  Lab 11/09/17 0355  11/11/17 0509 11/12/17 0443 11/12/17 2040 11/13/17 0417 11/13/17 1411  WBC 10.4  --  22.5* 18.8*  --  17.0* 18.1*  HGB 11.4*   < > 8.1* 6.5* 7.6* 7.8* 8.5*  HCT 36.8*   < > 26.0* 20.5* 23.7* 24.6* 26.1*  MCV 88.9  --  91.2 90.3  --  90.1 88.8  PLT 251  --  236 220  --  218 210   < > = values in this interval not displayed.   Cardiac Enzymes: No results for input(s): CKTOTAL, CKMB, CKMBINDEX, TROPONINI in the last 168 hours. BNP (last 3 results) No results for input(s): PROBNP in the last 8760 hours. CBG: Recent Labs  Lab 11/13/17 1154 11/13/17 1635 11/13/17 1954 11/14/17 0002 11/14/17 0416  GLUCAP 134* 137* 123* 147* 160*   D-Dimer: No results for input(s): DDIMER in the last 72 hours. Hgb A1c: No results for input(s): HGBA1C in the last 72 hours. Lipid Profile: No results for input(s): CHOL, HDL, LDLCALC, TRIG, CHOLHDL, LDLDIRECT in the last 72 hours. Thyroid function studies: No results for  input(s): TSH, T4TOTAL, T3FREE, THYROIDAB in the last 72 hours.  Invalid input(s): FREET3 Anemia work up: No results for input(s): VITAMINB12, FOLATE, FERRITIN, TIBC, IRON, RETICCTPCT in the last 72 hours. Sepsis Labs: Recent Labs  Lab 11/11/17 0509 11/12/17 0443 11/13/17 0417 11/13/17 1411  WBC 22.5* 18.8* 17.0* 18.1*   Microbiology Recent Results (from the past 240 hour(s))  Surgical PCR screen     Status: None   Collection Time: 11/09/17  5:30 AM  Result Value Ref Range Status   MRSA, PCR NEGATIVE NEGATIVE Final   Staphylococcus aureus NEGATIVE NEGATIVE Final    Comment: (NOTE) The Xpert SA Assay (FDA approved for NASAL specimens in patients 28 years of age and older), is one component of a comprehensive surveillance program. It is not intended to diagnose infection nor to guide or monitor treatment. Performed at [facility], [place] [address] [place], [place] [address]   Culture, blood (Routine X 2) w Reflex to ID Panel     Status: None (Preliminary result)   Collection Time: 11/11/17 12:03 PM  Result Value Ref Range Status   Specimen Description BLOOD LEFT ARM  Final   Special Requests   Final    BOTTLES DRAWN AEROBIC AND ANAEROBIC Blood Culture adequate volume   Culture   Final    NO GROWTH 2 DAYS Performed at [facility] [address] [place], [place] [address]    Report Status PENDING  Incomplete  Culture, blood (Routine X 2) w Reflex to ID Panel     Status: None (Preliminary result)   Collection Time: 11/11/17 12:18 PM  Result Value Ref Range Status   Specimen Description BLOOD LEFT ARM  Final   Special Requests   Final    BOTTLES DRAWN AEROBIC ONLY Blood Culture results may not be optimal due to an inadequate volume of blood received in culture bottles   Culture   Final    NO GROWTH 2 DAYS Performed at [facility], [place] [address], [place], [place] [address]    Report Status PENDING  Incomplete     Medications:   . sodium chloride    Intravenous Once  . sodium chloride   Intravenous Once  . allopurinol  300 mg Oral QHS  . amoxicillin-clavulanate  1 tablet Oral Q12H  . aspirin EC  81 mg  Oral BID WC  . clonazePAM  1 mg Oral Daily   And  . clonazePAM  2 mg Oral Q supper  . docusate sodium  100 mg Oral BID  . gabapentin  900 mg Oral QHS  . HYDROcodone-acetaminophen  1 tablet Oral QID  . insulin aspart  0-9 Units Subcutaneous Q4H  . lactose free nutrition  237 mL Oral TID BM  . levothyroxine  75 mcg Oral QAC breakfast  . metoprolol tartrate  100 mg Oral BID  . pantoprazole  40 mg Oral Daily  . polyethylene glycol  17 g Oral Daily  . potassium chloride  40 mEq Oral BID  . QUEtiapine  25 mg Oral QHS  . rOPINIRole  0.5 mg Oral QHS  . senna-docusate  2 tablet Oral QHS  . venlafaxine XR  225 mg Oral QHS   Continuous Infusions:    LOS: 6 days   Charlynne Cousins  [facility] Pager [phone]  *Please refer to [email or web address], password [email or web address] to get updated schedule on who will round on this patient, as hospitalists switch teams weekly. If 7PM-7AM, please contact night-coverage at [email or web address], password [email or web address] for any overnight needs.  11/14/2017, 8:50 AM

## 2017-11-14 NOTE — Progress Notes (Signed)
RN called to patient room, patient states he would like morphine drip decreased, he states he is "too out of it to visit with his family". MD paged, new orders to decrease drip to 1 mg/hour received. Patient and family updated on plan. Will continue to monitor.

## 2017-11-14 NOTE — Plan of Care (Signed)
  Problem: Activity: Goal: Ability to ambulate and perform ADLs will improve Outcome: Progressing   Problem: Clinical Measurements: Goal: Postoperative complications will be avoided or minimized Outcome: Progressing   Problem: Self-Concept: Goal: Ability to maintain and perform role responsibilities to the fullest extent possible will improve Outcome: Progressing   Problem: Pain Management: Goal: Pain level will decrease Outcome: Progressing   

## 2017-11-14 NOTE — Progress Notes (Signed)
During noon vitals NT made RN aware that patient HR still in 150s. MD Fleiz-Ortiz made aware and EKG obtained. EKG showed afib rvr. MD made aware and spoke to wife. Wife wishes to make patient comfort care per his stated wishes. Will continue to monitor patient and provide support.

## 2017-11-14 NOTE — Progress Notes (Signed)
   PATIENT ID: [name] [name]   4 Days Post-Op Procedure(s) (LRB): OPEN REDUCTION INTERNAL FIXATION (ORIF) PERIPROSTHETIC FRACTURE (Right)  Subjective: Continued pain in right leg. Wife in the room today understanding of pain mgmt regimen.   Objective:  Vitals:   11/14/17 0941 11/14/17 0946  BP: (!) 153/92   Pulse: (!) 152 (!) 111  Resp:    Temp:    SpO2:       R LE dressing c/d/i Wiggles toes, distally NVI  Calves soft nontender  Labs:  Recent Labs    11/12/17 0443 11/12/17 2040 11/13/17 0417 11/13/17 1411  HGB 6.5* 7.6* 7.8* 8.5*   Recent Labs    11/13/17 0417 11/13/17 1411  WBC 17.0* 18.1*  RBC 2.73* 2.94*  HCT 24.6* 26.1*  PLT 218 210   Recent Labs    11/13/17 0417 11/14/17 0700  NA 134* 133*  K 3.7 3.1*  CL 99 93*  CO2 27 29  BUN 15 16  CREATININE 1.18 1.18  GLUCOSE 130* 157*  CALCIUM 8.0* 8.0*    Assessment and Plan: 3 Days Post-OpProcedure(s) (LRB): OPEN REDUCTION INTERNAL FIXATION (ORIF) PERIPROSTHETIC FRACTURE (Right) ADDITIONAL DIAGNOSIS: Expected Acute Blood Loss Anemia, Acute Blood Loss Anemia, Diabetes andCongestive heart failure, chronic pain management, grade 3 renal failure, chronic,Status post colostomy takedown last year, postoperative psychoses that usually last about a week after his surgeries.  Plan: PT/OT WBAT, THA Toe touch weightbearing  DVT Prophylaxis: SCDx72 hrs, ASA 81 mg BID x 2 weeks  DISCHARGE PLAN:Skilled Nursing Facility/Rehab, when cleared by medicine  DISCHARGE NEEDS:HHPT, Walker and 3-in-1 comode seat

## 2017-11-15 ENCOUNTER — Inpatient Hospital Stay (HOSPITAL_COMMUNITY): Payer: Medicare Other

## 2017-11-15 LAB — CBC
HEMATOCRIT: 33.4 % — AB (ref 39.0–52.0)
Hemoglobin: 10.5 g/dL — ABNORMAL LOW (ref 13.0–17.0)
MCH: 28.1 pg (ref 26.0–34.0)
MCHC: 31.4 g/dL (ref 30.0–36.0)
MCV: 89.3 fL (ref 78.0–100.0)
Platelets: 438 10*3/uL — ABNORMAL HIGH (ref 150–400)
RBC: 3.74 MIL/uL — AB (ref 4.22–5.81)
RDW: 15.1 % (ref 11.5–15.5)
WBC: 21.3 10*3/uL — AB (ref 4.0–10.5)

## 2017-11-15 LAB — GLUCOSE, CAPILLARY
GLUCOSE-CAPILLARY: 181 mg/dL — AB (ref 70–99)
GLUCOSE-CAPILLARY: 160 mg/dL — AB (ref 70–99)
Glucose-Capillary: 155 mg/dL — ABNORMAL HIGH (ref 70–99)
Glucose-Capillary: 184 mg/dL — ABNORMAL HIGH (ref 70–99)
Glucose-Capillary: 183 mg/dL — ABNORMAL HIGH (ref 70–99)
Glucose-Capillary: 188 mg/dL — ABNORMAL HIGH (ref 70–99)

## 2017-11-15 LAB — BASIC METABOLIC PANEL
Anion gap: 13 (ref 5–15)
BUN: 28 mg/dL — AB (ref 8–23)
CHLORIDE: 85 mmol/L — AB (ref 98–111)
CO2: 32 mmol/L (ref 22–32)
Calcium: 9.8 mg/dL (ref 8.9–10.3)
Creatinine, Ser: 1.12 mg/dL (ref 0.61–1.24)
GFR calc Af Amer: >60 mL/min (ref >60)
GFR calc non Af Amer: >60 mL/min (ref >60)
Glucose, Bld: 184 mg/dL — ABNORMAL HIGH (ref 70–99)
POTASSIUM: 3.4 mmol/L — AB (ref 3.5–5.1)
SODIUM: 130 mmol/L — AB (ref 135–145)

## 2017-11-15 MED ORDER — CLONAZEPAM 1 MG PO TABS
1.0000 mg | ORAL_TABLET | Freq: Two times a day (BID) | ORAL | Status: DC | PRN
  Administered 2017-11-15: 1 mg via ORAL
  Filled 2017-11-15: qty 1

## 2017-11-15 MED ORDER — DILTIAZEM HCL-DEXTROSE 100-5 MG/100ML-% IV SOLN (PREMIX)
5.0000 mg/h | INTRAVENOUS | Status: DC
  Administered 2017-11-15 – 2017-11-16 (×3): 15 mg/h via INTRAVENOUS
  Filled 2017-11-15 (×3): qty 100

## 2017-11-15 MED ORDER — METHOCARBAMOL 500 MG PO TABS: 500.0000 mg | ORAL_TABLET | Freq: Four times a day (QID) | ORAL | Status: DC | PRN

## 2017-11-15 MED ORDER — HYDROCODONE-ACETAMINOPHEN 10-325 MG PO TABS: 1.0000 | ORAL_TABLET | Freq: Four times a day (QID) | ORAL | Status: DC | PRN

## 2017-11-15 MED ORDER — FUROSEMIDE 20 MG PO TABS
20.0000 mg | ORAL_TABLET | Freq: Every day | ORAL | Status: DC
  Administered 2017-11-15: 20 mg via ORAL
  Filled 2017-11-15: qty 1

## 2017-11-15 MED ORDER — GABAPENTIN 300 MG PO CAPS
900.0000 mg | ORAL_CAPSULE | Freq: Every day | ORAL | Status: DC
  Administered 2017-11-15: 900 mg via ORAL
  Filled 2017-11-15: qty 3

## 2017-11-15 MED ORDER — ALLOPURINOL 300 MG PO TABS
300.0000 mg | ORAL_TABLET | Freq: Every day | ORAL | Status: DC
  Administered 2017-11-15: 300 mg via ORAL
  Filled 2017-11-15: qty 1

## 2017-11-15 MED ORDER — AMOXICILLIN-POT CLAVULANATE 875-125 MG PO TABS
1.0000 | ORAL_TABLET | Freq: Two times a day (BID) | ORAL | Status: DC
  Administered 2017-11-15 (×2): 1 via ORAL
  Filled 2017-11-15 (×3): qty 1

## 2017-11-15 MED ORDER — POLYVINYL ALCOHOL 1.4 % OP SOLN
2.0000 [drp] | Freq: Three times a day (TID) | OPHTHALMIC | Status: DC | PRN
  Filled 2017-11-15: qty 15

## 2017-11-15 MED ORDER — DOCUSATE SODIUM 100 MG PO CAPS: 100.0000 mg | ORAL_CAPSULE | Freq: Four times a day (QID) | ORAL | Status: DC | PRN

## 2017-11-15 MED ORDER — POLYETHYLENE GLYCOL 3350 17 G PO PACK
17.0000 g | PACK | Freq: Two times a day (BID) | ORAL | Status: DC
  Administered 2017-11-15: 17 g via ORAL
  Filled 2017-11-15 (×2): qty 1

## 2017-11-15 MED ORDER — ASPIRIN EC 81 MG PO TBEC
81.0000 mg | DELAYED_RELEASE_TABLET | Freq: Every day | ORAL | Status: DC
  Administered 2017-11-15: 81 mg via ORAL
  Filled 2017-11-15: qty 1

## 2017-11-15 MED ORDER — DILTIAZEM LOAD VIA INFUSION
15.0000 mg | Freq: Once | INTRAVENOUS | Status: AC
  Administered 2017-11-15: 15 mg via INTRAVENOUS
  Filled 2017-11-15: qty 15

## 2017-11-15 MED ORDER — MORPHINE SULFATE (PF) 4 MG/ML IV SOLN
4.0000 mg | INTRAVENOUS | Status: DC | PRN
  Administered 2017-11-15 – 2017-11-16 (×2): 4 mg via INTRAVENOUS
  Filled 2017-11-15 (×3): qty 1

## 2017-11-15 MED ORDER — POTASSIUM GLUCONATE 595 (99 K) MG PO TABS: 595.0000 mg | ORAL_TABLET | Freq: Every day | ORAL | Status: DC

## 2017-11-15 MED ORDER — PANTOPRAZOLE SODIUM 40 MG PO TBEC
40.0000 mg | DELAYED_RELEASE_TABLET | Freq: Every day | ORAL | Status: DC
Start: 2017-11-15 — End: 2017-11-16
  Administered 2017-11-15: 40 mg via ORAL
  Filled 2017-11-15: qty 1

## 2017-11-15 MED ORDER — METOPROLOL TARTRATE 5 MG/5ML IV SOLN
5.0000 mg | Freq: Four times a day (QID) | INTRAVENOUS | Status: DC | PRN
  Administered 2017-11-15 – 2017-11-16 (×3): 5 mg via INTRAVENOUS
  Filled 2017-11-15 (×4): qty 5

## 2017-11-15 MED ORDER — INSULIN ASPART 100 UNIT/ML ~~LOC~~ SOLN
3.0000 [IU] | Freq: Three times a day (TID) | SUBCUTANEOUS | Status: DC
  Administered 2017-11-15: 3 [IU] via SUBCUTANEOUS

## 2017-11-15 MED ORDER — SODIUM CHLORIDE 0.9 % IV BOLUS: 500.0000 mL | Freq: Once | INTRAVENOUS | Status: AC

## 2017-11-15 MED ORDER — LEVOTHYROXINE SODIUM 75 MCG PO TABS: 75.0000 ug | ORAL_TABLET | Freq: Every day | ORAL | Status: DC

## 2017-11-15 MED ORDER — METOPROLOL TARTRATE 5 MG/5ML IV SOLN
2.5000 mg | Freq: Once | INTRAVENOUS | Status: AC
  Administered 2017-11-15: 2.5 mg via INTRAVENOUS
  Filled 2017-11-15: qty 5

## 2017-11-15 MED ORDER — APIXABAN 5 MG PO TABS
5.0000 mg | ORAL_TABLET | Freq: Two times a day (BID) | ORAL | Status: DC
  Administered 2017-11-15 (×2): 5 mg via ORAL
  Filled 2017-11-15 (×2): qty 1

## 2017-11-15 MED ORDER — MAGNESIUM OXIDE 400 (241.3 MG) MG PO TABS: 400.0000 mg | ORAL_TABLET | Freq: Every day | ORAL | Status: DC

## 2017-11-15 MED ORDER — INSULIN ASPART 100 UNIT/ML ~~LOC~~ SOLN: 0.0000 [IU] | Freq: Every day | SUBCUTANEOUS | Status: DC

## 2017-11-15 MED ORDER — METOPROLOL TARTRATE 5 MG/5ML IV SOLN
10.0000 mg | Freq: Once | INTRAVENOUS | Status: AC
  Administered 2017-11-15: 10 mg via INTRAVENOUS

## 2017-11-15 MED ORDER — FLORAJEN3 PO CAPS: ORAL_CAPSULE | Freq: Every day | ORAL | Status: DC

## 2017-11-15 MED ORDER — VENLAFAXINE HCL ER 75 MG PO CP24
225.0000 mg | ORAL_CAPSULE | Freq: Every day | ORAL | Status: DC
  Administered 2017-11-15: 225 mg via ORAL
  Filled 2017-11-15: qty 1

## 2017-11-15 MED ORDER — METOPROLOL TARTRATE 50 MG PO TABS
100.0000 mg | ORAL_TABLET | Freq: Two times a day (BID) | ORAL | Status: DC
  Administered 2017-11-15 (×2): 100 mg via ORAL
  Filled 2017-11-15: qty 2
  Filled 2017-11-15: qty 1

## 2017-11-15 MED ORDER — SIMETHICONE 80 MG PO CHEW
80.0000 mg | CHEWABLE_TABLET | Freq: Four times a day (QID) | ORAL | Status: DC | PRN
  Administered 2017-11-15: 80 mg via ORAL
  Filled 2017-11-15: qty 1

## 2017-11-15 MED ORDER — SODIUM CHLORIDE 0.9 % IV SOLN
INTRAVENOUS | Status: DC
  Administered 2017-11-15 (×2): via INTRAVENOUS

## 2017-11-15 MED ORDER — MAGNESIUM 500 MG PO TABS: 500.0000 mg | ORAL_TABLET | Freq: Every day | ORAL | Status: DC

## 2017-11-15 MED ORDER — CALCIUM CARBONATE ANTACID 500 MG PO CHEW
1.0000 | CHEWABLE_TABLET | Freq: Three times a day (TID) | ORAL | Status: DC | PRN
  Administered 2017-11-15: 600 mg via ORAL
  Filled 2017-11-15: qty 3

## 2017-11-15 MED ORDER — LEVALBUTEROL HCL 0.63 MG/3ML IN NEBU
0.6300 mg | INHALATION_SOLUTION | Freq: Once | RESPIRATORY_TRACT | Status: AC
  Administered 2017-11-15: 0.63 mg via RESPIRATORY_TRACT
  Filled 2017-11-15: qty 3

## 2017-11-15 MED ORDER — CLONAZEPAM 1 MG PO TABS
2.0000 mg | ORAL_TABLET | Freq: Every day | ORAL | Status: DC
  Administered 2017-11-15: 2 mg via ORAL
  Filled 2017-11-15: qty 2

## 2017-11-15 MED ORDER — INSULIN ASPART 100 UNIT/ML ~~LOC~~ SOLN
0.0000 [IU] | Freq: Three times a day (TID) | SUBCUTANEOUS | Status: DC
  Administered 2017-11-15: 3 [IU] via SUBCUTANEOUS

## 2017-11-15 MED ORDER — LORAZEPAM 2 MG PO TABS: 2.0000 mg | ORAL_TABLET | Freq: Four times a day (QID) | ORAL | 0 refills | Status: AC | PRN

## 2017-11-15 MED ORDER — RISAQUAD PO CAPS
1.0000 | ORAL_CAPSULE | Freq: Every day | ORAL | Status: DC
  Filled 2017-11-15: qty 1

## 2017-11-15 MED ORDER — HYDROCODONE-ACETAMINOPHEN 10-325 MG PO TABS
1.0000 | ORAL_TABLET | Freq: Four times a day (QID) | ORAL | Status: DC
  Administered 2017-11-15 (×4): 1 via ORAL
  Filled 2017-11-15 (×4): qty 1

## 2017-11-15 NOTE — Progress Notes (Signed)
PATIENT ID: [name] [name]  MRN: [id number]  DOB/AGE:  Jul 10, 1945 / 72 y.o.  5 Days Post-Op Procedure(s) (LRB): OPEN REDUCTION INTERNAL FIXATION (ORIF) PERIPROSTHETIC FRACTURE (Right)    PROGRESS NOTE Subjective: Patient is alert, oriented, no Nausea, no Vomiting, yes passing gas, . Taking PO with pt drinking water in bed. Denies SOB, Chest or Calf Pain. Using Incentive Spirometer, PAS in place. Ambulate touch toe weight bearing. Patient reports pain as  moderate .    Objective: Vital signs in last 24 hours: Vitals:   11/14/17 1154 11/14/17 1156 11/14/17 1928 11/15/17 0428  BP: (!) 136/114 115/86 (!) 128/102 (!) 148/109  Pulse: (!) 129 (!) 131 87 (!) 134  Resp: (!) 24     Temp: 98.6 F (37 C)  98 F (36.7 C) 98 F (36.7 C)  TempSrc: Oral  Oral Oral  SpO2: 100% 99% 93% 100%  Weight:      Height:          Intake/Output from previous day: I/O last 3 completed shifts: In: 501.1 [P.O.:480; I.V.:21.1] Out: 4900 [Urine:4900]   Intake/Output this shift: No intake/output data recorded.   LABORATORY DATA: Recent Labs    11/13/17 0417  11/13/17 1411  11/14/17 0700  11/14/17 1953 11/15/17 0100 11/15/17 0438  WBC 17.0*  --  18.1*  --   --   --   --   --   --   HGB 7.8*  --  8.5*  --   --   --   --   --   --   HCT 24.6*  --  26.1*  --   --   --   --   --   --   PLT 218  --  210  --   --   --   --   --   --   NA 134*  --   --   --  133*  --   --   --   --   K 3.7  --   --   --  3.1*  --   --   --   --   CL 99  --   --   --  93*  --   --   --   --   CO2 27  --   --   --  29  --   --   --   --   BUN 15  --   --   --  16  --   --   --   --   CREATININE 1.18  --   --   --  1.18  --   --   --   --   GLUCOSE 130*  --   --   --  157*  --   --   --   --   GLUCAP  --    < >  --    < >  --    < > 154* 155* 184*  CALCIUM 8.0*  --   --   --  8.0*  --   --   --   --    < > = values in this interval not displayed.    Examination: Neurologically intact Neurovascular intact Sensation  intact distally Intact pulses distally Dorsiflexion/Plantar flexion intact Incision: scant drainage No cellulitis present Compartment soft} XR AP&Lat of hip shows well placed\fixed THA  Assessment:   5 Days Post-Op Procedure(s) (LRB): OPEN REDUCTION  INTERNAL FIXATION (ORIF) PERIPROSTHETIC FRACTURE (Right) ADDITIONAL DIAGNOSIS:  Expected Acute Blood Loss Anemia, Diabetes and Congestive heart failure, chronic pain management, grade 3 renal failure, chronic,Status post colostomy takedown last year, postoperative psychoses that usually last about a week after his surgeries.  Plan: PT/OT touch toe weight bearing, THA  DVT Prophylaxis: SCDx72 hrs, ASA 81 mg BID x 2 weeks  DISCHARGE PLAN: Skilled Nursing Facility/Rehab, when cleared by medicine  DISCHARGE NEEDS: HHPT, Walker and 3-in-1 comode seat

## 2017-11-15 NOTE — Progress Notes (Signed)
Heart rate  Fluctuating  To 150's down to  112, a fib asymptomatic. MD made aware. Continue to monitor.

## 2017-11-15 NOTE — Care Management (Addendum)
Case manager and unit social worker, Pricilla Riffle, have been in conversation with patient's wife, son and daughter. Mrs. Depuy is adamant that her husband is not competent and he should be on a morphine drip. She says that even though patient told his nurse he wants to live and not "die like a dog" he didn't know what he was saying. Patient appears appropriate in conversation with this Probation officer. Mrs. Alen says it has been "one thing after the other" and "sometimes hospitals do too much to keep people alive". She states they had discussions at home about hospice and he wants to go. Patient's son and daughter say that they are supportive of their mom. Case manager attempted to explain to patient's family the need for patient to be seen by psychiatry since there is question of his capacity. Bedside RN and CM/SW hand spoken with the attending MD. When Case manager returned to patient room he is asking that " he be allowed to get some rest, he hasnt slept in days". Case manager will continue to follow along with Education officer, museum.

## 2017-11-15 NOTE — Progress Notes (Addendum)
Family reported that patient stated that he would like to put a gun on his head. This nurse asked the patient if he wanted to hurt/harm himself and patient stated NO. Patients wife stated that the patient expressed those feelings because he was in pain and he wants to die. When this nurse was in the room the patient did not express any suicide ideations. Patient very calm at this time. Alert and able to follow commands. MD notified. Will cont to monitor.

## 2017-11-15 NOTE — Progress Notes (Signed)
Daily Progress Note   Patient Name: [name] [name]       Date: 11/15/2017 DOB: [birth year]  Age: 72 y.o. MRN#: [id number] Attending Physician: Charlynne Cousins, MD Primary Care Physician: Mellody Dance, DO Admit Date: 11/09/2017  Reason for Consultation/Follow-up: Establishing goals of care and Psychosocial/spiritual support  Subjective: Long conversations with both patient and family, first separately and then together.  Patient is alert and orientated.  He does not want hospice at this point.  He wants to rehab and attempt to get better.  He wanted to go to [facility] for rehab so that he would not burden his family.  I explained to him that his prognosis is limited and his wife wants him to be close.  Wife who has cared for husband for 12 years full time (since retiring after 64 years with [employer]) believes he would be best served by Hospice.  She also wants him to be local so that she can care for him and see him.  Patient is not eligible for hospice house.  His prognosis is likely greater than 2 weeks.    Patient and wife agree to SNF rehab locally with Palliative to follow.  Wife requests [facility] if possible.   We reviewed the MOST form together which the wife asked to keep for reference but did not complete.  Patient complaining of pain and severe leg cramps in right leg/foot.   Assessment: Patient with chronic pain, sedentary life style.  Wife with high level of care giver burn out. Hgb has been low requiring 3 units of transfusion. Patient A&O and wants continued full scope treatment with DNR code status.    Patient Profile/HPI:  72 y.o. male  with past medical history of type 2 diabetes mellitus, hypertension, anxiety, coronary artery disease status post CABG, chronic  diastolic CHF, and hypothyroidism admitted on 10/22/2017 with severe right hip pain and fracture after fall.    Length of Stay: 7  Current Medications: Scheduled Meds:  . allopurinol  300 mg Oral QHS  . aspirin EC  81 mg Oral Daily  . clonazePAM  2 mg Oral Daily  . clonazePAM  2 mg Oral q1800  . [START ON 11/16/2017] FLORAJEN3   Oral Q breakfast  . furosemide  20 mg Oral Daily  . gabapentin  900 mg  Oral QHS  . HYDROcodone-acetaminophen  1 tablet Oral QID  . [START ON 11/16/2017] levothyroxine  75 mcg Oral QAC breakfast  . [START ON 11/16/2017] Magnesium  500 mg Oral Q breakfast  . metoprolol tartrate  100 mg Oral BID  . [START ON 11/16/2017] potassium gluconate  595 mg Oral Q breakfast  . QUEtiapine  25 mg Oral QHS    Continuous Infusions:   PRN Meds: calcium carbonate, docusate sodium, feeding supplement (ENSURE ENLIVE), HYDROcodone-acetaminophen, LORazepam, methocarbamol, morphine injection, simethicone  Physical Exam        Well developed man, A&O, complaining of cramping pain in right lower extremity. CV tachycardic and irreg. Resp no distress Abdomen distended, non tender.   Vital Signs: BP (!) 148/109 (BP Location: Right Arm)   Pulse (!) 134   Temp 98 F (36.7 C) (Oral)   Resp (!) 24   Ht 5\' 10"  (1.778 m)   Wt 104.3 kg   SpO2 100%   BMI 33.00 kg/m  SpO2: SpO2: 100 % O2 Device: O2 Device: Room Air O2 Flow Rate: O2 Flow Rate (L/min): 2 L/min  Intake/output summary:   Intake/Output Summary (Last 24 hours) at 11/15/2017 1224 Last data filed at 11/15/2017 0900 Gross per 24 hour  Intake 141.09 ml  Output 700 ml  Net -558.91 ml   LBM: Last BM Date: 11/14/17 Baseline Weight: Weight: 104.3 kg Most recent weight: Weight: 104.3 kg       Palliative Assessment/Data:  40%    Flowsheet Rows     Most Recent Value  Intake Tab  Referral Department  Hospitalist  Unit at Time of Referral  Med/Surg Unit  Palliative Care Primary Diagnosis  Trauma  Date Notified   11/09/17  Palliative Care Type  New Palliative care  Reason for referral  Clarify Goals of Care  Date of Admission  11/11/2017  Date first seen by Palliative Care  11/09/17  # of days Palliative referral response time  0 Day(s)  # of days IP prior to Palliative referral  1  Clinical Assessment  Psychosocial & Spiritual Assessment  Palliative Care Outcomes      Patient Active Problem List   Diagnosis Date Noted  . Periprosthetic fracture around internal prosthetic hip joint   . Palliative care encounter   . Closed right hip fracture, initial encounter ([place]) 11/09/2017  . Dyslipidemia 08/31/2017  . Chronic gouty arthropathy- L 1st toe 06/09/2017  . Bunion of great toe of left foot 06/09/2017  . Hallux valgus (acquired), left foot 06/09/2017  . Toenail fungus 06/09/2017  . Anemia 04/07/2017  . Chronic diastolic CHF (congestive heart failure) ([place]) 04/01/2017  . Hypertension associated with diabetes ([place]) 04/01/2017  . Mixed diabetic hyperlipidemia associated with type 2 diabetes mellitus ([place]) 04/01/2017  . Fatigue 04/01/2017  . History of smoking 30 or more pack years 04/01/2017  . S/P hernia repair 07/27/2016  . Preop cardiovascular exam 05/30/2016  . S/P colostomy takedown 11/05/2015  . Abdominal pain   . Agitation   . Threatening to others   . Diverticulitis of large intestine with perforation without bleeding   . Type 2 diabetes mellitus with complication ([place])   . CAD in native artery   . Diverticulitis 04/26/2015  . Perforated diverticulum of large intestine 04/26/2015  . Diverticulitis of colon with perforation 04/26/2015  . Mood disorder ([place])- mixed PTSD and depression 04/26/2015  . GERD (gastroesophageal reflux disease) 04/26/2015  . Hypothyroidism 04/26/2015  . Acute encephalopathy 08/25/2014  . Acute on chronic renal  failure ([place]) 08/25/2014  . Blood poisoning   . Elevated troponin   . CAP (community acquired pneumonia) 08/24/2014  . Sepsis ([place]) 08/24/2014    . Hypokalemia 08/24/2014  . AKI (acute kidney injury) ([place]) 08/24/2014  . Alcohol abuse 08/24/2014  . DM (diabetes mellitus), type 2 ([place]) 02/28/2013  . Abnormal EKG 02/28/2013  . [place] 11/05/2011  . Hyperlipidemia 02/06/2011  . Insomnia 02/06/2011  . Hypertension   . CAD (coronary artery disease)     Palliative Care Plan    Recommendations/Plan:  Will add robaxin PRN for leg cramps  Check Hgb as I'm concerned he may still be dropping despite 3 units.  Patient wants to continue full scope treatment and discharge to Acute Rehab at [facility] (if possible)  Will reschedule klonopin and hydrocodone as they are at home.  Palliative to follow at SNF.  Goals of Care and Additional Recommendations:  Limitations on Scope of Treatment: Full Scope Treatment  Code Status:  DNR  Prognosis:   < 6 months secondary to advanced heart disease, immobility, poor PO intake, chronic pain, recent fracture.   Discharge Planning:  [place] for rehab with Palliative care service follow-up  Care plan was discussed with [facility] attending MD, patient, family, and bedside RN.  Thank you for allowing the Palliative Medicine Team to assist in the care of this patient.  Total time spent:  60 min.     Greater than 50%  of this time was spent counseling and coordinating care related to the above assessment and plan.  Florentina Jenny, PA-C Palliative Medicine  Please contact Palliative MedicineTeam phone at [phone] for questions and concerns between 7 am - 7 pm.   Please see AMION for individual provider pager numbers.

## 2017-11-15 NOTE — Progress Notes (Addendum)
Bruni for apixaban Indication: atrial fibrillation  Labs: Recent Labs    11/12/17 2040 11/13/17 0417 11/13/17 1411 11/14/17 0700  HGB 7.6* 7.8* 8.5*  --   HCT 23.7* 24.6* 26.1*  --   PLT  --  218 210  --   CREATININE  --  1.18  --  1.18    Assessment: 15 yom with new-onset afib presented s/p hip fracture after mechanical fall. S/p hip repair 11/11/2017. Pharmacy consulted to dose apixaban. Not on anticoagulation PTA. Hg low but stable s/p 3u PRBC this admit, plt wnl, SCr 1.18 stable. No bleed documented.  Goal of Therapy:  Stroke prevention Monitor platelets by anticoagulation protocol: Yes   Plan:  Apixaban 5mg  PO BID Monitor CBC, s/sx bleeding  Elicia Lamp, PharmD, BCPS Clinical Pharmacist 11/15/2017 1:05 PM

## 2017-11-15 NOTE — Progress Notes (Addendum)
[facility] PROGRESS NOTE    Progress Note  [name] [name]  PJK:[id number] DOB: 05-08-1945 DOA: 11/15/2017 PCP: Mellody Dance, DO     Brief Narrative:   [name] [name] is an 72 y.o. male past medical history of diabetes mellitus type 2, essential hypertension anxiety chronic narcotic use, CAD status post CABG in 8099, chronic diastolic heart failure hypothyroidism presents with right hip fracture following a mechanical fall.  Assessment/Plan:   Closed right hip fracture, initial encounter [name] [name]): Due to mechanical fall. Right hemi-hip arthroplasty, on 10/19/2017. Orthopedic surgery recommended 50% weightbearing.  Acute blood loss anemia: Status post 3 units of packed red blood cells hemoglobin has remained stable.  CAD S/p CABG: No anginal symptoms.  Chronic diastolic heart failure: Seems to be euvolemic.  Anxiety and depression: Continue Klonopin  Essential Hypertension: Continue metoprolol relatively well controlled.  Mood disorder ([place])- mixed PTSD and depression Continue current home medication no changes made.  Type 2 diabetes mellitus with complication [name]): Hold oral hypoglycemic agents continue sliding scale insulin.  Acute confusional state: Continue Seroquel at bedtime and Haldol IV as needed.  Lead EKG showed a QTC of 485 unchanged from previous  New acute kidney injury: Likely prerenal hold nephrotoxic medications. Resolved to baseline with IV fluid hydration.  New onset fever probably due to aspiration pneumonia: No antibiotics as per patients request.  Goals of care: Appreciate hospice and palliative care assistance. The patient and wife on 11/14/2017 decided to move towards comfort care as the patient was tired of the way he was living, he was not willing to work with physical therapy at a rehab [name].  He had developed aspiration pneumonia acute decompensated heart failure and A. fib with RVR after his surgical intervention.  After long  discussion with him and his wife they decided to stop treatment, they did not want any further interventions, labs or medication. He just wanted to be made comfortable and he has to be put in a place where they would take care of him during his last few days. This morning he express wanted aggressive treatment. I will have Palliative care come back and talk with him and his wife.  Hypokalemia: No labs  DVT prophylaxis: lovenox Family Communication:none Disposition Plan/Barrier to D/C: Skilled nursing facility  Code Status:     Code Status Orders  (From admission, onward)         Start     Ordered   11/09/17 1029  Do not attempt resuscitation (DNR)  Continuous    Question Answer Comment  In the event of cardiac or respiratory ARREST Do not call a "code blue"   In the event of cardiac or respiratory ARREST Do not perform Intubation, CPR, defibrillation or ACLS   In the event of cardiac or respiratory ARREST Use medication by any route, position, wound care, and other measures to relive pain and suffering. May use oxygen, suction and manual treatment of airway obstruction as needed for comfort.      11/09/17 1030        Code Status History    Date Active Date Inactive Code Status Order ID Comments User Context   11/09/2017 0110 11/09/2017 1030 Full Code [id number]  Vianne Bulls, MD Inpatient   07/27/2016 1240 08/01/2016 1902 Full Code [id number]  Ralene Ok, MD Inpatient   11/05/2015 1426 11/12/2015 1359 Full Code [id number]  Georganna Skeans, MD Inpatient   04/26/2015 0205 05/06/2015 1501 Full Code [id number]  Gabriel Rainwater, RN Inpatient   08/24/2014  7858 08/27/2014 1714 Full Code [id number]  Caren Griffins, MD ED   01/01/2014 1216 01/01/2014 1806 Full Code [id number]  Linus Mako, PA-C ED   03/01/2013 1745 03/03/2013 1809 Full Code [id number]  Penelope Coop Inpatient   02/28/2013 2255 03/01/2013 1745 Full Code [id number]  Annita Brod, MD Inpatient    Advance Directive  Documentation     Most Recent Value  Type of Advance Directive  Healthcare Power of [place], Living will  Pre-existing out of facility DNR order (yellow form or pink MOST form)  -  "MOST" Form in Place?  -        IV Access:    Peripheral IV   Procedures and diagnostic studies:   Dg Chest Port 1 View  Result Date: 11/13/2017 CLINICAL DATA:  Wheezing, shortness of breath EXAM: PORTABLE CHEST 1 VIEW COMPARISON:  11/11/2017 FINDINGS: Bilateral mild interstitial thickening more prominent at the left lung base. Increased hazy airspace disease at the left lung base which may reflect atelectasis versus pneumonia. No pleural effusion or pneumothorax. Stable cardiomegaly. Prior CABG. No acute osseous abnormality. IMPRESSION: Cardiomegaly with mild pulmonary vascular congestion. Increased hazy airspace disease at the left lung base which may reflect atelectasis versus pneumonia. Electronically Signed   By: Kathreen Devoid   On: 11/13/2017 13:45     Medical Consultants:    None.  Anti-Infectives:   None  Subjective:    [name] [name] patient had a good night rest he has no new complaints this morning.  Objective:    Vitals:   11/14/17 1154 11/14/17 1156 11/14/17 1928 11/15/17 0428  BP: (!) 136/114 115/86 (!) 128/102 (!) 148/109  Pulse: (!) 129 (!) 131 87 (!) 134  Resp: (!) 24     Temp: 98.6 F (37 C)  98 F (36.7 C) 98 F (36.7 C)  TempSrc: Oral  Oral Oral  SpO2: 100% 99% 93% 100%  Weight:      Height:        Intake/Output Summary (Last 24 hours) at 11/15/2017 0849 Last data filed at 11/15/2017 0500 Gross per 24 hour  Intake 501.09 ml  Output 2200 ml  Net -1698.91 ml   Filed Weights   11/13/2017 2229 10/25/2017 1341  Weight: 104.3 kg 104.3 kg    Exam: General exam: In no acute distress. Respiratory system: Good air movement and clear to auscultation. Cardiovascular system: S1 & S2 heard, RRR.  Gastrointestinal system: Abdomen is nondistended, soft and nontender.    Central nervous system:  No focal neurological deficits. Extremities: No pedal edema, with a retractor in place. Skin: No rashes, lesions or ulcers    Data Reviewed:    Labs: Basic Metabolic Panel: Recent Labs  Lab 11/09/17 0355 11/04/2017 1949 11/11/17 0509 11/12/17 0443 11/13/17 0417 11/14/17 0700  NA 135 138 133* 133* 134* 133*  K 3.8 4.4 3.8 3.8 3.7 3.1*  CL 98  --  100 101 99 93*  CO2 29  --  21* 24 27 29   GLUCOSE 135* 143* 209* 148* 130* 157*  BUN 10  --  14 13 15 16   CREATININE 1.16  --  1.50* 1.19 1.18 1.18  CALCIUM 8.6*  --  8.0* 7.8* 8.0* 8.0*  MG  --   --   --  1.6*  --  1.7   GFR Estimated Creatinine Clearance: 68.4 mL/min (by C-G formula based on SCr of 1.18 mg/dL). Liver Function Tests: No results for input(s): AST, ALT, ALKPHOS, BILITOT, PROT, ALBUMIN  in the last 168 hours. No results for input(s): LIPASE, AMYLASE in the last 168 hours. No results for input(s): AMMONIA in the last 168 hours. Coagulation profile No results for input(s): INR, PROTIME in the last 168 hours.  CBC: Recent Labs  Lab 11/09/17 0355  11/11/17 0509 11/12/17 0443 11/12/17 2040 11/13/17 0417 11/13/17 1411  WBC 10.4  --  22.5* 18.8*  --  17.0* 18.1*  HGB 11.4*   < > 8.1* 6.5* 7.6* 7.8* 8.5*  HCT 36.8*   < > 26.0* 20.5* 23.7* 24.6* 26.1*  MCV 88.9  --  91.2 90.3  --  90.1 88.8  PLT 251  --  236 220  --  218 210   < > = values in this interval not displayed.   Cardiac Enzymes: No results for input(s): CKTOTAL, CKMB, CKMBINDEX, TROPONINI in the last 168 hours. BNP (last 3 results) No results for input(s): PROBNP in the last 8760 hours. CBG: Recent Labs  Lab 11/14/17 1145 11/14/17 1953 11/15/17 0100 11/15/17 0438 11/15/17 0807  GLUCAP 149* 154* 155* 184* 183*   D-Dimer: No results for input(s): DDIMER in the last 72 hours. Hgb A1c: No results for input(s): HGBA1C in the last 72 hours. Lipid Profile: No results for input(s): CHOL, HDL, LDLCALC, TRIG, CHOLHDL,  LDLDIRECT in the last 72 hours. Thyroid function studies: No results for input(s): TSH, T4TOTAL, T3FREE, THYROIDAB in the last 72 hours.  Invalid input(s): FREET3 Anemia work up: No results for input(s): VITAMINB12, FOLATE, FERRITIN, TIBC, IRON, RETICCTPCT in the last 72 hours. Sepsis Labs: Recent Labs  Lab 11/11/17 0509 11/12/17 0443 11/13/17 0417 11/13/17 1411  WBC 22.5* 18.8* 17.0* 18.1*   Microbiology Recent Results (from the past 240 hour(s))  Surgical PCR screen     Status: None   Collection Time: 11/09/17  5:30 AM  Result Value Ref Range Status   MRSA, PCR NEGATIVE NEGATIVE Final   Staphylococcus aureus NEGATIVE NEGATIVE Final    Comment: (NOTE) The Xpert SA Assay (FDA approved for NASAL specimens in patients 54 years of age and older), is one component of a comprehensive surveillance program. It is not intended to diagnose infection nor to guide or monitor treatment. Performed at [facility], [place] [address], [place], [place] [address]   Culture, blood (Routine X 2) w Reflex to ID Panel     Status: None (Preliminary result)   Collection Time: 11/11/17 12:03 PM  Result Value Ref Range Status   Specimen Description BLOOD LEFT ARM  Final   Special Requests   Final    BOTTLES DRAWN AEROBIC AND ANAEROBIC Blood Culture adequate volume   Culture   Final    NO GROWTH 3 DAYS Performed at [facility], [address], [place], [place] [address]    Report Status PENDING  Incomplete  Culture, blood (Routine X 2) w Reflex to ID Panel     Status: None (Preliminary result)   Collection Time: 11/11/17 12:18 PM  Result Value Ref Range Status   Specimen Description BLOOD LEFT ARM  Final   Special Requests   Final    BOTTLES DRAWN AEROBIC ONLY Blood Culture results may not be optimal due to an inadequate volume of blood received in culture bottles   Culture   Final    NO GROWTH 3 DAYS Performed at [facility], [place] [address], [place], [place] [address]     Report Status PENDING  Incomplete     Medications:   .  HYDROcodone-acetaminophen  1 tablet Oral QID  . QUEtiapine  25 mg Oral QHS   Continuous Infusions:    LOS: 7 days   Charlynne Cousins  [facility] Pager [phone]  *Please refer to [email or web address], password [email or web address] to get updated schedule on who will round on this patient, as hospitalists switch teams weekly. If 7PM-7AM, please contact night-coverage at [email or web address], password [email or web address] for any overnight needs.  11/15/2017, 8:49 AM

## 2017-11-15 NOTE — Progress Notes (Signed)
Pt called RN to the room and expressed the desire to stop the morphine drip. Pt verbalized 'I don't want to be put to sleep with this medication just what they do with dogs. I want it stop tonight. I want to live. I am getting better.' Also, patient wanted to put back all his home meds on medication list. Pt called and talked to his wife and agreed with pt's plan. RN notified the on-call hospitalist and order to stop the morphine drip and patient can restart taking medication until pt's attending physician orders to do so. Morphine IV drip was discontinued. Pt resting comfortably on bed at this time. Nursing will continue to monitor.

## 2017-11-15 NOTE — Progress Notes (Signed)
EKG CRITICAL VALUE     12 lead EKG performed.  Critical value noted.  Iona Beard, RN notified.   Marilynne Halsted, CCT 11/15/2017 1:48 PM

## 2017-11-15 NOTE — Plan of Care (Signed)

## 2017-11-15 NOTE — Progress Notes (Signed)
PT Cancellation Note  Patient Details Name: [name] [name] MRN: [id number] DOB: 1945/04/19   Cancelled Treatment:    Reason Eval/Treat Not Completed: Per chart review and conversation with pt's son, it appears that pt/family is undecided about whether to pursue comfort care at this time. Will hold acute PT services at this time and follow for clarification for goals of care.   Thelma Comp 11/15/2017, 9:52 AM   Rolinda Roan, PT, DPT Acute Rehabilitation Services Pager: [phone] Office: [phone]

## 2017-11-15 NOTE — Progress Notes (Signed)
Patient transferred to [facility]. Report called in to [facility]. Patient alert and verbal. No c/o pain or discomfort. Wife accompanied patient.

## 2017-11-15 NOTE — Progress Notes (Addendum)
BP (!) 130/97   Pulse 99   Temp 99.5 F (37.5 C) (Oral)   Resp 20   Ht 5\' 10"  (1.778 m)   Wt 104.3 kg   SpO2 94%   BMI 33.00 kg/m  Repeated EKG after oral metoprolol given shows A. fib with RVR. His heart rate is ranging from 100-1 30 atrial fibrillation.  We will start him on a diltiazem drip and transfer him to stepdown for goal heart rate of less than 100 we will start him on Eliquis I already discussed with the patient the risk and benefits of being on anticoagulation I have also spoken to the family and they agreed we will proceed with starting Eliquis.  I also spoke to the family again they would like a psych consult for capacity, despite me telling him he does have capacity, they will like a second opinon.

## 2017-11-15 NOTE — Progress Notes (Signed)
Transferred in from [place] by bed awake and alert.. On a-fib at 150 .  Asymptomatic. Started with cardizem  Gtt . Continue to monitor.

## 2017-11-15 NOTE — Progress Notes (Addendum)
BP (!) 148/109 (BP Location: Right Arm)   Pulse (!) 134   Temp 98 F (36.7 C) (Oral)   Resp (!) 24   Ht 5' 10" (1.778 m)   Wt 104.3 kg   SpO2 100%   BMI 33.00 kg/m  After patient and family met with PMT they decided toward aggressive care. We will start him back on oral Augemntin, aspirin, metoprolol,narctoics, Effexor, Norco, Eliquis for new onset a fib., SSI, consult back PT and SW for SNF placement. Family would like a psyq. evaluation.  Check 12 lead EKG B -met and CBC.

## 2017-11-15 NOTE — Progress Notes (Addendum)
Received critical EKG results. Md notified. New orders received and carried out. Patient denies chest pain.

## 2017-11-15 NOTE — Progress Notes (Signed)
CSW met with RNCM and patient family including patient's wife, son and daughter.   CSW agrees with RNCM note of:  Mrs. Plouff is adamant that her husband is not competent and he should be on a morphine drip. She says that even though patient told nurse he wants to live and not "die like a dog" he didn't know what he was saying. Patient appears appropriate in conversation with this Probation officer. Mrs. Scioneaux says it has been "one thing after the other" and "sometimes hospitals do too much to keep people alive". She states they had discussions at home about hospice and he wants to go. Patient's son and daughter say that they are supportive of their mom. Case manager attempted to explain to patient's family the need for patient to be seen by psychiatry since there is question of his capacity. Bedside RN and CM/SW hand spoken with the attending MD. When Case manager returned to patient room he is asking that " he be allowed to get some rest, he hasnt slept in days". Case manager will continue to follow along with Social worker  [place], [place] [phone]

## 2017-11-16 ENCOUNTER — Inpatient Hospital Stay (HOSPITAL_COMMUNITY): Payer: Medicare Other

## 2017-11-16 DIAGNOSIS — I10 Essential (primary) hypertension: Secondary | ICD-10-CM

## 2017-11-16 DIAGNOSIS — Z96649 Presence of unspecified artificial hip joint: Secondary | ICD-10-CM

## 2017-11-16 DIAGNOSIS — R0603 Acute respiratory distress: Secondary | ICD-10-CM

## 2017-11-16 DIAGNOSIS — E118 Type 2 diabetes mellitus with unspecified complications: Secondary | ICD-10-CM

## 2017-11-16 DIAGNOSIS — S72001A Fracture of unspecified part of neck of right femur, initial encounter for closed fracture: Secondary | ICD-10-CM

## 2017-11-16 DIAGNOSIS — I2510 Atherosclerotic heart disease of native coronary artery without angina pectoris: Secondary | ICD-10-CM

## 2017-11-16 DIAGNOSIS — R509 Fever, unspecified: Secondary | ICD-10-CM

## 2017-11-16 DIAGNOSIS — M978XXA Periprosthetic fracture around other internal prosthetic joint, initial encounter: Secondary | ICD-10-CM

## 2017-11-16 DIAGNOSIS — Z978 Presence of other specified devices: Secondary | ICD-10-CM

## 2017-11-16 DIAGNOSIS — Z515 Encounter for palliative care: Secondary | ICD-10-CM

## 2017-11-16 DIAGNOSIS — I5032 Chronic diastolic (congestive) heart failure: Secondary | ICD-10-CM

## 2017-11-16 DIAGNOSIS — M9701XA Periprosthetic fracture around internal prosthetic right hip joint, initial encounter: Secondary | ICD-10-CM

## 2017-11-16 DIAGNOSIS — F39 Unspecified mood [affective] disorder: Secondary | ICD-10-CM

## 2017-11-16 LAB — BASIC METABOLIC PANEL
ANION GAP: 13 (ref 5–15)
BUN: 34 mg/dL — AB (ref 8–23)
CALCIUM: 10.2 mg/dL (ref 8.9–10.3)
CO2: 29 mmol/L (ref 22–32)
Chloride: 86 mmol/L — ABNORMAL LOW (ref 98–111)
Creatinine, Ser: 1.15 mg/dL (ref 0.61–1.24)
GFR calc Af Amer: >60 mL/min (ref >60)
GFR calc non Af Amer: >60 mL/min (ref >60)
GLUCOSE: 182 mg/dL — AB (ref 70–99)
Potassium: 3.6 mmol/L (ref 3.5–5.1)
Sodium: 128 mmol/L — ABNORMAL LOW (ref 135–145)

## 2017-11-16 LAB — CULTURE, BLOOD (ROUTINE X 2)
CULTURE: NO GROWTH
Culture: NO GROWTH
Special Requests: ADEQUATE

## 2017-11-16 LAB — CBC
HEMATOCRIT: 30 % — AB (ref 39.0–52.0)
HEMOGLOBIN: 9.3 g/dL — AB (ref 13.0–17.0)
MCH: 28 pg (ref 26.0–34.0)
MCHC: 31 g/dL (ref 30.0–36.0)
MCV: 90.4 fL (ref 78.0–100.0)
Platelets: 392 10*3/uL (ref 150–400)
RBC: 3.32 MIL/uL — ABNORMAL LOW (ref 4.22–5.81)
RDW: 14.9 % (ref 11.5–15.5)
WBC: 23.5 10*3/uL — ABNORMAL HIGH (ref 4.0–10.5)

## 2017-11-16 MED ORDER — LORAZEPAM 2 MG/ML IJ SOLN: 1.0000 mg | Freq: Three times a day (TID) | INTRAMUSCULAR | Status: DC

## 2017-11-16 MED ORDER — LORAZEPAM 2 MG/ML IJ SOLN
1.0000 mg | INTRAMUSCULAR | Status: DC | PRN
  Administered 2017-11-16: 2 mg via INTRAVENOUS
  Administered 2017-11-16: 1 mg via INTRAVENOUS
  Filled 2017-11-16: qty 1

## 2017-11-16 MED ORDER — KETOROLAC TROMETHAMINE 15 MG/ML IJ SOLN: 15.0000 mg | Freq: Four times a day (QID) | INTRAMUSCULAR | Status: DC | PRN

## 2017-11-16 MED ORDER — HYDROMORPHONE HCL 1 MG/ML IJ SOLN: 2.0000 mg | INTRAMUSCULAR | Status: DC | PRN

## 2017-11-16 MED ORDER — METOPROLOL TARTRATE 5 MG/5ML IV SOLN: 5.0000 mg | INTRAVENOUS | Status: DC

## 2017-11-16 MED ORDER — METOPROLOL TARTRATE 5 MG/5ML IV SOLN
INTRAVENOUS | Status: AC
  Filled 2017-11-16: qty 5

## 2017-11-16 MED ORDER — ONDANSETRON 4 MG PO TBDP: 4.0000 mg | ORAL_TABLET | Freq: Four times a day (QID) | ORAL | Status: DC | PRN

## 2017-11-16 MED ORDER — ONDANSETRON HCL 4 MG/2ML IJ SOLN: 4.0000 mg | Freq: Four times a day (QID) | INTRAMUSCULAR | Status: DC | PRN

## 2017-11-16 MED ORDER — HYDROMORPHONE HCL 1 MG/ML IJ SOLN
2.0000 mg | Freq: Once | INTRAMUSCULAR | Status: AC
  Administered 2017-11-16: 2 mg via INTRAVENOUS
  Filled 2017-11-16: qty 2

## 2017-11-16 MED ORDER — LORAZEPAM 2 MG/ML IJ SOLN
2.0000 mg | INTRAMUSCULAR | Status: DC | PRN
  Filled 2017-11-16: qty 1

## 2017-11-16 MED ORDER — HYDROMORPHONE HCL 1 MG/ML IJ SOLN: 2.0000 mg | Freq: Once | INTRAMUSCULAR | Status: DC

## 2017-11-16 MED ORDER — MORPHINE 100MG IN NS 100ML (1MG/ML) PREMIX INFUSION
6.0000 mg/h | INTRAVENOUS | Status: DC
  Administered 2017-11-16: 2 mg/h via INTRAVENOUS
  Administered 2017-11-17: 6 mg/h via INTRAVENOUS
  Filled 2017-11-16 (×2): qty 100

## 2017-11-16 MED ORDER — LORAZEPAM 2 MG/ML IJ SOLN
2.0000 mg | INTRAMUSCULAR | Status: DC
  Administered 2017-11-16 – 2017-11-17 (×2): 2 mg via INTRAVENOUS
  Filled 2017-11-16: qty 1

## 2017-11-16 MED ORDER — GLYCOPYRROLATE 0.2 MG/ML IJ SOLN
0.4000 mg | Freq: Once | INTRAMUSCULAR | Status: AC
  Administered 2017-11-16: 0.4 mg via INTRAVENOUS

## 2017-11-16 MED ORDER — MORPHINE SULFATE (PF) 2 MG/ML IV SOLN
2.0000 mg | Freq: Once | INTRAVENOUS | Status: AC
  Administered 2017-11-16: 2 mg via INTRAVENOUS
  Filled 2017-11-16: qty 1

## 2017-11-16 MED ORDER — HALOPERIDOL LACTATE 5 MG/ML IJ SOLN: 2.0000 mg | Freq: Four times a day (QID) | INTRAMUSCULAR | Status: DC | PRN

## 2017-11-16 MED ORDER — BIOTENE DRY MOUTH MT LIQD: 15.0000 mL | OROMUCOSAL | Status: DC | PRN

## 2017-11-16 MED ORDER — MORPHINE BOLUS VIA INFUSION
2.0000 mg | INTRAVENOUS | Status: DC | PRN
  Filled 2017-11-16: qty 4

## 2017-11-16 MED ORDER — METOPROLOL TARTRATE 5 MG/5ML IV SOLN: 5.0000 mg | Freq: Four times a day (QID) | INTRAVENOUS | Status: DC

## 2017-11-16 MED ORDER — MORPHINE BOLUS VIA INFUSION
2.0000 mg | INTRAVENOUS | Status: DC | PRN
  Administered 2017-11-16: 2 mg via INTRAVENOUS
  Filled 2017-11-16: qty 2

## 2017-11-16 MED ORDER — POLYVINYL ALCOHOL 1.4 % OP SOLN
1.0000 [drp] | Freq: Four times a day (QID) | OPHTHALMIC | Status: DC | PRN
  Filled 2017-11-16: qty 15

## 2017-11-16 MED ORDER — SCOPOLAMINE 1 MG/3DAYS TD PT72
1.0000 | MEDICATED_PATCH | TRANSDERMAL | Status: DC
  Administered 2017-11-16: 1.5 mg via TRANSDERMAL
  Filled 2017-11-16: qty 1

## 2017-11-16 MED ORDER — METOPROLOL TARTRATE 5 MG/5ML IV SOLN
5.0000 mg | Freq: Three times a day (TID) | INTRAVENOUS | Status: DC
  Administered 2017-11-16: 5 mg via INTRAVENOUS

## 2017-11-16 MED ORDER — MORPHINE BOLUS VIA INFUSION
4.0000 mg | INTRAVENOUS | Status: DC | PRN
  Administered 2017-11-16: 4 mg via INTRAVENOUS
  Administered 2017-11-16: 6 mg via INTRAVENOUS
  Administered 2017-11-16: 4 mg via INTRAVENOUS
  Administered 2017-11-16: 6 mg via INTRAVENOUS
  Administered 2017-11-16 (×2): 4 mg via INTRAVENOUS
  Administered 2017-11-16: 6 mg via INTRAVENOUS
  Filled 2017-11-16: qty 6

## 2017-11-16 MED ORDER — MORPHINE SULFATE (PF) 4 MG/ML IV SOLN
4.0000 mg | INTRAVENOUS | Status: DC | PRN
  Administered 2017-11-16 (×2): 4 mg via INTRAVENOUS
  Filled 2017-11-16: qty 1

## 2017-11-16 MED ORDER — DIGOXIN 0.25 MG/ML IJ SOLN
0.2500 mg | Freq: Once | INTRAMUSCULAR | Status: AC
  Administered 2017-11-16: 0.25 mg via INTRAVENOUS
  Filled 2017-11-16: qty 2

## 2017-11-16 MED ORDER — LORAZEPAM 2 MG/ML IJ SOLN
2.0000 mg | Freq: Three times a day (TID) | INTRAMUSCULAR | Status: DC
  Administered 2017-11-16: 2 mg via INTRAVENOUS
  Filled 2017-11-16: qty 1

## 2017-11-16 MED ORDER — HYDROCOD POLST-CPM POLST ER 10-8 MG/5ML PO SUER
5.0000 mL | Freq: Once | ORAL | Status: AC
  Administered 2017-11-16: 5 mL via ORAL
  Filled 2017-11-16: qty 5

## 2017-11-16 MED ORDER — GLYCOPYRROLATE 0.2 MG/ML IJ SOLN
0.4000 mg | Freq: Four times a day (QID) | INTRAMUSCULAR | Status: DC
  Administered 2017-11-16 – 2017-11-17 (×4): 0.4 mg via INTRAVENOUS
  Filled 2017-11-16 (×5): qty 2

## 2017-11-16 NOTE — Progress Notes (Signed)
RN met with patient's family and palliative NP at bedside. Patient's son now at bedside. Family agreed on decreasing oxygen, requested Venturi mask be removed. Patient's oxygen sats remain in 70s.

## 2017-11-16 NOTE — Significant Event (Signed)
Rapid Response Event Note  Overview: Respiratory/Neurologic/Cardiac - Overall Decline.  Initial Focused Assessment: Called by RN about having ongoing decline in respiratory status and overall status. Per RN, patient is a DNR, was on comfort care but code status was changed to DNR earlier in the day. Per RN, patient is experiencing respiratory distress, in AF (on Diltiazem drip), and does from being agitated/impulsive to being somnolent. I was with another patient in a medical emergency, so I asked the RN to page the [facility] NP and RT and that I would come as soon as I could. RT was able to NTS the patient, green-brown bile was NTS from the patient. Per RN, patient declined further and RN reached back to me.   I came and saw the patient at 54. Upon arrival - patient was respiratory distress, RR in the upper 30s, rales/rhonchi throoughout, L > R, 100% on VM 6L (mouth breather). Per RN, BP did get soft Diltiazem was stopped and small fluid bolus was started. BP improved. Abdomen very distended, faint bowel sounds. Skin was cool to touch.    RN had been updating the [facility] NP throughout the night. RN called the patient's wife. Per wife, permission given for NGT if indicated but no further interventions. Patient's children are coming in today and once his family is here, they wish to proceed with making patient comfortable again.   Interventions: - KUB - NGT   Plan of Care: - Monitor respiratory status - Keep patient comfortable as well.   Event Summary:    at     Call Time 0430 Arrival Time 0440 End Time 0640  Jaiyana Canale R

## 2017-11-16 NOTE — Progress Notes (Addendum)
Pt is in mild respiratory distresses. RR is 28, POX 88 on 2l o2 nasal cannula and pt has b/l expiatory wheezing. MD was made aware and gave orders. Will continue to monitor pt.

## 2017-11-16 NOTE — Progress Notes (Signed)
Shift event note:  Notified by RN regarding worsening BBS. Pt suctioned and secretions appeared bilious in nature. Slow increase in 02 demand through-out shift. PCXR earlier this evening w/o acute changes. At bedside pt remains intermittently agitated and mumbling. Denies pain but doubt dependable historian. BBS wet w/ copious upper airway secrestion and occasional cough. Abd is very distended, No discernable bs. No appreciable TTP.  Stat abd XR finding suggest ileus. Placement of NGT ordered. Wife in agreement w/ NGT placement. Remains in A-fib though rate more controlled. Will continue to monitor closely in SDU.   Jeryl Columbia, NP-C [facility] Pager [phone]

## 2017-11-16 NOTE — Progress Notes (Signed)
Patient continues to struggle with secretions, palliative care paged for PRN order. Will continue to monitor.

## 2017-11-16 NOTE — Progress Notes (Signed)
Patient's family requested patient be suctioned by respiratory therapy. RN called respiratory therapy who deep suctioned the patient. Patient's family advised by both RN and respiratory therapy that deep suctioning could cause increased agitation and is not a permanent solution to the secretions. RN will continue to monitor.

## 2017-11-16 NOTE — Progress Notes (Signed)
Nutrition Brief Note  Chart reviewed. Pt now transitioning to comfort care.  No further nutrition interventions warranted at this time.  Please re-consult as needed.   Seren Chaloux A. Hassani Sliney, RD, LDN, CDE Pager: [phone] After hours Pager: [phone]  

## 2017-11-16 NOTE — Progress Notes (Signed)
Pt still appears to be in a mild respiratory distress made worse by his agitation and confusion. RR 24 POX 94 on 2L o2. Pt has b/l expiatory wheezing and rhonic on the R side.  RN notified MD and MD gave order to obtain chest xray. Will continue to monitor pt.

## 2017-11-16 NOTE — Progress Notes (Addendum)
PT Cancellation Note/ Discharge  Patient Details Name: [name] [name] MRN: [id number] DOB: [birth year]   Cancelled Treatment:    Reason Eval/Treat Not Completed: Patient not medically ready(pt with progressive decline in medical status and currently unresponsive and not appropriate. Family has requested comfort care at this time and will sign off.    Sandy Salaam Nhyla Nappi 11/16/2017, 8:42 AM  Elwyn Reach, PT Acute Rehabilitation Services Pager: [phone] Office: [phone]

## 2017-11-16 NOTE — Progress Notes (Signed)
RN spoke with patient's family, who are wanting to increase the morphine drip. RN reviewed orders, paged palliative on-call for direction. MD ordered scopolamine patch for secretions and gave verbal order for drip increase if patient was requiring increased boluses.  1943 morphine drip was ordered to increase to 6mg /h. Night shift nurse will monitor.

## 2017-11-16 NOTE — Progress Notes (Signed)
Patient with declining respiratory and neuro status, MD paged to evaluate. Patient's wife requesting comfort care be resumed, palliative medicine paged. Awaiting response from palliative medicine. Patient made NPO, will monitor for further decline and signs of increased pain/discomfort.

## 2017-11-16 NOTE — Progress Notes (Signed)
Daily Progress Note   Patient Name: [name] [name]       Date: 11/16/2017 DOB: 1945-09-02  Age: 72 y.o. MRN#: [id number] Attending Physician: Flora Lipps, MD Primary Care Physician: Mellody Dance, DO Admit Date: 11/11/2017  Reason for Consultation/Follow-up: Terminal Care  Subjective: [name] [name] is now actively dying and family have gathered at bedside.   Length of Stay: 8  Current Medications: Scheduled Meds:  . glycopyrrolate  0.4 mg Intravenous QID  . LORazepam  1 mg Intravenous TID  . metoprolol tartrate  5 mg Intravenous Q6H    Continuous Infusions: . sodium chloride 10 mL/hr at 11/16/17 0926  . morphine      PRN Meds: antiseptic oral rinse, LORazepam, morphine injection, morphine, ondansetron **OR** ondansetron (ZOFRAN) IV, polyvinyl alcohol, polyvinyl alcohol  Physical Exam  Constitutional: He appears well-developed. He has a sickly appearance. He appears ill.  HENT:  Head: Normocephalic and atraumatic.  NGT to LIWS  Cardiovascular: Tachycardia present.  Frequent ectopy, SVT, VT  Pulmonary/Chest: Accessory muscle usage present. Tachypnea noted. He is in respiratory distress.  + audible secretions  Abdominal: He exhibits distension.  Neurological: He is unresponsive.  Nursing note and vitals reviewed.           Vital Signs: BP 113/88 (BP Location: Right Arm)   Pulse 95   Temp (!) 100.6 F (38.1 C) (Axillary)   Resp (!) 24   Ht 5' 10"  (1.778 m)   Wt 104.3 kg   SpO2 98%   BMI 33.00 kg/m  SpO2: SpO2: 98 % O2 Device: O2 Device: Venturi Mask O2 Flow Rate: O2 Flow Rate (L/min): 6 L/min  Intake/output summary:   Intake/Output Summary (Last 24 hours) at 11/16/2017 0931 Last data filed at 11/16/2017 0000 Gross per 24 hour  Intake 962.48 ml  Output 300 ml    Net 662.48 ml   LBM: Last BM Date: 11/15/17 Baseline Weight: Weight: 104.3 kg Most recent weight: Weight: 104.3 kg       Palliative Assessment/Data:    Flowsheet Rows     Most Recent Value  Intake Tab  Referral Department  Hospitalist  Unit at Time of Referral  Med/Surg Unit  Palliative Care Primary Diagnosis  Trauma  Date Notified  11/09/17  Palliative Care Type  New Palliative care  Reason for referral  Clarify Goals of Care  Date of Admission  10/27/2017  Date first seen by Palliative Care  11/09/17  # of days Palliative referral response time  0 Day(s)  # of days IP prior to Palliative referral  1  Clinical Assessment  Psychosocial & Spiritual Assessment  Palliative Care Outcomes      Patient Active Problem List   Diagnosis Date Noted  . Periprosthetic fracture around internal prosthetic hip joint   . Palliative care encounter   . Closed right hip fracture, initial encounter ([place]) 11/09/2017  . Dyslipidemia 08/31/2017  . Chronic gouty arthropathy- L 1st toe 06/09/2017  . Bunion of great toe of left foot 06/09/2017  . Hallux valgus (acquired), left foot 06/09/2017  . Toenail fungus 06/09/2017  . Anemia 04/07/2017  . Chronic diastolic CHF (congestive heart failure) ([place]) 04/01/2017  . Hypertension associated with diabetes ([place]) 04/01/2017  . Mixed diabetic hyperlipidemia associated with type 2 diabetes mellitus ([place]) 04/01/2017  . Fatigue 04/01/2017  . History of smoking 30 or more pack years 04/01/2017  . S/P hernia repair 07/27/2016  . Preop cardiovascular exam 05/30/2016  . S/P colostomy takedown 11/05/2015  . Abdominal pain   . Agitation   . Threatening to others   . Diverticulitis of large intestine with perforation without bleeding   . Type 2 diabetes mellitus with complication ([place])   . CAD in native artery   . Diverticulitis 04/26/2015  . Perforated diverticulum of large intestine 04/26/2015  . Diverticulitis of colon with perforation 04/26/2015  .  Mood disorder ([place])- mixed PTSD and depression 04/26/2015  . GERD (gastroesophageal reflux disease) 04/26/2015  . Hypothyroidism 04/26/2015  . Acute encephalopathy 08/25/2014  . Acute on chronic renal failure ([place]) 08/25/2014  . Blood poisoning   . Elevated troponin   . CAP (community acquired pneumonia) 08/24/2014  . Sepsis ([place]) 08/24/2014  . Hypokalemia 08/24/2014  . AKI (acute kidney injury) ([place]) 08/24/2014  . Alcohol abuse 08/24/2014  . DM (diabetes mellitus), type 2 ([place]) 02/28/2013  . Abnormal EKG 02/28/2013  . [place] 11/05/2011  . Hyperlipidemia 02/06/2011  . Insomnia 02/06/2011  . Hypertension   . CAD (coronary artery disease)     Palliative Care Assessment & Plan   HPI: 72 y.o. male  with past medical history of type 2 diabetes mellitus, hypertension, anxiety, coronary artery disease status post CABG, chronic diastolic CHF, and hypothyroidism admitted on 10/29/2017 with severe right hip pain and fracture after fall. Surgical repair 9/25 but difficult recovery. He had made the decision over the weekend for comfort care but then reversed his decision before acutely declining overnight and transitioned to full comfort care this morning 10/1.   Assessment: Much support provided to family at bedside today. Family are tearful but prepared for full comfort care. The one concern is that their other son is en-route on plane from [place] and should arrive this evening ~5 pm. They want him comfortable but want him to live until son can arrive this afternoon. I spent much time with [name] Kraft, RN to titrate medications and infusions to make [name] [name] comfortable. He is on high doses of opioids and benzos chronically so is requiring much higher doses to achieve comfort at EOL.   I also met again with family in the afternoon when other son has arrived and we have discussed deescalation of care and removing any measures of life prolongation. I recommended to decrease and remove oxygen and to  utilize morphine to manage any respiratory distress. He appears much  more comfortable this evening but still requiring frequent boluses.   Recommendations/Plan:  Pain/SOB: Morphine infusion 4 mg/hr increased to 6 mg/hr this evening after called from request from family. Bolus 4-6 mg every 15 min prn. If morphine ineffective dilaudid IV 2 mg available prn.   Chronic benzo use and anxiety/PTSD: Ativan increased to 2 mg IV every 4 hours and 2 mg IV every 2 hours prn.   Robinul scheduled and IVF stopped to help with secretions.   Toradol IV prn fever.   Goals of Care and Additional Recommendations:  Limitations on Scope of Treatment: Full Comfort Care  Code Status:  DNR  Prognosis:   Hours - Days  Discharge Planning:  Anticipated Hospital Death  Thank you for allowing the Palliative Medicine Team to assist in the care of this patient.   Total Time 0900-1000,  1200-1220,  1700-1730,  110 min Prolonged Time Billed  yes       Greater than 50%  of this time was spent counseling and coordinating care related to the above assessment and plan.  Vinie Sill, NP Palliative Medicine Team Pager # [phone] (M-F 8a-5p) Team Phone # [phone] (Nights/Weekends)

## 2017-11-16 NOTE — Progress Notes (Signed)
RT NOTES: Patient NT suctioned by request from family for moderate thick yellow secretions. Explained to family that secretions would likely build right back up and that this was causing the patient extra stress. Family states they understand but would like RT to come back one more time when other family gets there to suction him again. RN at bedside. Patient on venturi mask. Will continue to monitor patient.

## 2017-11-16 NOTE — Progress Notes (Addendum)
[facility] PROGRESS NOTE    Progress Note  [name] [name]  DGL:[id number] DOB: [birth year] DOA: 11/03/2017 PCP: Mellody Dance, DO    Brief Narrative:   [name] [name] is an 72 y.o. male with past medical history of diabetes mellitus type 2, hypertension, anxiety, chronic narcotic use, coronary artery disease status post CABG in 5188, chronic diastolic heart failure, hypothyroidism presents to the hospital with right hip fracture following mechanical fall.   Assessment/Plan:   Closed right hip fracture, initial encounter . Secondary to mechanical fall.  Patient underwent right hemiarthroplasty on 11/04/2017.  Patient medical condition has not improved at this time, limiting any activity.  Acute blood loss anemia: Patient received 3 units of packed RBC.  Hemoglobin extremity stable.  CAD S/p CABG: No acute symptoms at this time.  Chronic diastolic heart failure: Does not appear to be volume overloaded.  Anxiety and depression: Currently, patient has decreased responsiveness  Essential Hypertension: We will continue to monitor.  Mood disorder ([place])- mixed PTSD and depression He was continued on home medication.  Difficult to administer p.o. due to decreased responsiveness.  Type 2 diabetes mellitus with complication ([place]): Was not sliding scale insulin.  Acute confusional state: Patient has not improved.  Family has requested comfort care at this time  New acute kidney injury: Improved.  New onset fever probably due to aspiration pneumonia: Not on antibiotics.  T-max of 100.6 F.  Goals of care: I had a prolonged discussion with the patient's wife at bedside.  Nursing staff were present during discussion .  Patient's wife wishes to pursue with comfort care at this time.  Scope of comfort care was explained to  family.  Patient is agreeable to proceed with comfort care.   DVT prophylaxis: lovenox  Family Communication: Spoke with wife  Disposition Plan/Barrier  to D/C:  Initiate comfort care  Code Status: DNR  IV Access:    Peripheral IV   Procedures and diagnostic studies:   Dg Chest Port 1 View  Result Date: 11/16/2017 CLINICAL DATA:  Rhonchi EXAM: PORTABLE CHEST 1 VIEW COMPARISON:  11/13/2017 FINDINGS: Mild bibasilar opacities, likely atelectasis. Retrocardiac/left lower lobe pneumonia is not excluded. No frank interstitial edema. No pleural effusion or pneumothorax. Cardiomegaly.  Postsurgical changes related to prior CABG. Median sternotomy. IMPRESSION: Mild bibasilar opacities, likely atelectasis. Retrocardiac/left lower lobe pneumonia is not excluded. Electronically Signed   By: Julian Hy M.D.   On: 11/16/2017 00:16   Dg Abd Portable 1v  Result Date: 11/16/2017 CLINICAL DATA:  NG tube placement. EXAM: PORTABLE ABDOMEN - 1 VIEW COMPARISON:  Prior exam same day FINDINGS: Surgical clips right upper quadrant. Prior median sternotomy. NG tube noted with tip below left hemidiaphragm. Multiple distended loops of small and large bowel again noted consistent adynamic ileus. Follow-up exam to demonstrate resolution and to exclude bowel obstruction suggested. No free air. Stable elevation left hemidiaphragm. Left base atelectatic changes. IMPRESSION: 1. Interim placement of NG tube, its tip is below left hemidiaphragm. 2. Persistent changes of adynamic ileus. Continued follow-up exams to demonstrate resolution of bowel distention. 3. Stable elevation left hemidiaphragm. Left base atelectatic changes. Electronically Signed   By: Marcello Moores  Register   On: 11/16/2017 07:32   Dg Abd Portable 1v  Result Date: 11/16/2017 CLINICAL DATA:  Rhonchi.  Ileus.  Abdominal distention. EXAM: PORTABLE ABDOMEN - 1 VIEW COMPARISON:  CT 10/01/2017 FINDINGS: Diffuse gaseous distention of stomach, small bowel and colon suggesting ileus. No evidence of free air on this single portable view. Cholecystectomy  clips in the right upper quadrant. IMPRESSION: Diffuse gaseous  distention of stomach, small and large bowel suggesting ileus. Recommend continued radiographic follow-up to ensure resolution. Electronically Signed   By: Keith Rake M.D.   On: 11/16/2017 05:54     Medical Consultants:    Palliative care  Anti-Infectives:   None  Subjective:    Patient had worsening dypnea, on ventimask, had episodes of Vtach,not responding to medical treatment. Family wishes comfort care.    Objective:    Vitals:   11/16/17 0500 11/16/17 0515 11/16/17 0545 11/16/17 0600  BP: (!) 87/64 135/82 124/85 120/84  Pulse: 69 86 98 71  Resp: (!) 30 (!) 26 (!) 30 (!) 27  Temp:      TempSrc:      SpO2: 97% 98% 98% 99%  Weight:      Height:        Intake/Output Summary (Last 24 hours) at 11/16/2017 0758 Last data filed at 11/16/2017 0000 Gross per 24 hour  Intake 1082.48 ml  Output 300 ml  Net 782.48 ml   Filed Weights   10/17/2017 2229 11/02/2017 1341  Weight: 104.3 kg 104.3 kg    Exam: General exam: in moderate distress on Ventimask, unresponsive Respiratory system: coarse breath sounds bilaterally Cardiovascular system: S1 & S2 heard, irregular rhythm Gastrointestinal system: Abdomen distended, soft and nontender.  Central nervous system: not responsive Extremities: right hip surgery Skin: No rashes   Data Reviewed:    Labs: Basic Metabolic Panel: Recent Labs  Lab 11/12/17 0443 11/13/17 0417 11/14/17 0700 11/15/17 1830 11/16/17 0218  NA 133* 134* 133* 130* 128*  K 3.8 3.7 3.1* 3.4* 3.6  CL 101 99 93* 85* 86*  CO2 24 27 29  32 29  GLUCOSE 148* 130* 157* 184* 182*  BUN 13 15 16  28* 34*  CREATININE 1.19 1.18 1.18 1.12 1.15  CALCIUM 7.8* 8.0* 8.0* 9.8 10.2  MG 1.6*  --  1.7  --   --    GFR Estimated Creatinine Clearance: 70.2 mL/min (by C-G formula based on SCr of 1.15 mg/dL). Liver Function Tests: No results for input(s): AST, ALT, ALKPHOS, BILITOT, PROT, ALBUMIN in the last 168 hours. No results for input(s): LIPASE, AMYLASE in  the last 168 hours. No results for input(s): AMMONIA in the last 168 hours. Coagulation profile No results for input(s): INR, PROTIME in the last 168 hours.  CBC: Recent Labs  Lab 11/12/17 0443 11/12/17 2040 11/13/17 0417 11/13/17 1411 11/15/17 1145 11/16/17 0218  WBC 18.8*  --  17.0* 18.1* 21.3* 23.5*  HGB 6.5* 7.6* 7.8* 8.5* 10.5* 9.3*  HCT 20.5* 23.7* 24.6* 26.1* 33.4* 30.0*  MCV 90.3  --  90.1 88.8 89.3 90.4  PLT 220  --  218 210 438* 392   Cardiac Enzymes: No results for input(s): CKTOTAL, CKMB, CKMBINDEX, TROPONINI in the last 168 hours. BNP (last 3 results) No results for input(s): PROBNP in the last 8760 hours. CBG: Recent Labs  Lab 11/15/17 0438 11/15/17 0807 11/15/17 1224 11/15/17 1624 11/15/17 2248  GLUCAP 184* 183* 181* 188* 160*   D-Dimer: No results for input(s): DDIMER in the last 72 hours. Hgb A1c: No results for input(s): HGBA1C in the last 72 hours. Lipid Profile: No results for input(s): CHOL, HDL, LDLCALC, TRIG, CHOLHDL, LDLDIRECT in the last 72 hours. Thyroid function studies: No results for input(s): TSH, T4TOTAL, T3FREE, THYROIDAB in the last 72 hours.  Invalid input(s): FREET3 Anemia work up: No results for input(s): VITAMINB12, FOLATE, FERRITIN, TIBC, IRON,  RETICCTPCT in the last 72 hours. Sepsis Labs: Recent Labs  Lab 11/13/17 0417 11/13/17 1411 11/15/17 1145 11/16/17 0218  WBC 17.0* 18.1* 21.3* 23.5*   Microbiology Recent Results (from the past 240 hour(s))  Surgical PCR screen     Status: None   Collection Time: 11/09/17  5:30 AM  Result Value Ref Range Status   MRSA, PCR NEGATIVE NEGATIVE Final   Staphylococcus aureus NEGATIVE NEGATIVE Final    Comment: (NOTE) The Xpert SA Assay (FDA approved for NASAL specimens in patients 49 years of age and older), is one component of a comprehensive surveillance program. It is not intended to diagnose infection nor to guide or monitor treatment. Performed at [facility],  [place] [address], [place], [place] [address]   Culture, blood (Routine X 2) w Reflex to ID Panel     Status: None   Collection Time: 11/11/17 12:03 PM  Result Value Ref Range Status   Specimen Description BLOOD LEFT ARM  Final   Special Requests   Final    BOTTLES DRAWN AEROBIC AND ANAEROBIC Blood Culture adequate volume   Culture   Final    NO GROWTH 5 DAYS Performed at [facility], [address] [place], [place] [address]    Report Status 11/16/2017 FINAL  Final  Culture, blood (Routine X 2) w Reflex to ID Panel     Status: None   Collection Time: 11/11/17 12:18 PM  Result Value Ref Range Status   Specimen Description BLOOD LEFT ARM  Final   Special Requests   Final    BOTTLES DRAWN AEROBIC ONLY Blood Culture results may not be optimal due to an inadequate volume of blood received in culture bottles   Culture   Final    NO GROWTH 5 DAYS Performed at [facility], [place] [address] [place], [place] [address]    Report Status 11/16/2017 FINAL  Final     Medications:   . acidophilus  1 capsule Oral Q breakfast  . allopurinol  300 mg Oral QHS  . amoxicillin-clavulanate  1 tablet Oral Q12H  . apixaban  5 mg Oral BID  . aspirin EC  81 mg Oral Daily  . clonazePAM  2 mg Oral Daily  . clonazePAM  2 mg Oral q1800  . furosemide  20 mg Oral Daily  . gabapentin  900 mg Oral QHS  . HYDROcodone-acetaminophen  1 tablet Oral QID  . insulin aspart  0-15 Units Subcutaneous TID WC  . insulin aspart  0-5 Units Subcutaneous QHS  . insulin aspart  3 Units Subcutaneous TID WC  . levothyroxine  75 mcg Oral QAC breakfast  . magnesium oxide  400 mg Oral Q breakfast  . metoprolol tartrate  100 mg Oral BID  . pantoprazole  40 mg Oral Daily  . polyethylene glycol  17 g Oral BID  . QUEtiapine  25 mg Oral QHS  . venlafaxine XR  225 mg Oral QHS   Continuous Infusions: . sodium chloride 75 mL/hr at 11/15/17 2119  . diltiazem (CARDIZEM) infusion Stopped (11/16/17 0508)     LOS: 8 days    Doyt Castellana  [facility] Pager [phone]  *Please refer to [email or web address], password [email or web address] to get updated schedule on who will round on this patient, as hospitalists switch teams weekly. If 7PM-7AM, please contact night-coverage at [email or web address], password [email or web address] for any overnight needs.  11/16/2017, 7:58 AM       .

## 2017-11-16 DEATH — deceased

## 2017-12-17 NOTE — Progress Notes (Signed)
0335 hr dropped to 50 with agonal respirations.  RN to room, made wife aware of pt changes.  Stayed with pt til asystole.  Pt pronounced at 0346.  Wife at bedside.  Raliegh Ip Schorr paged.  [place] notified.  Spoke with Alvira Philips.  Pt suitable for tissue donation only.  Family with pt at this time.

## 2017-12-17 NOTE — Discharge Summary (Signed)
Death Summary  [name] [name] LPF:[id number] DOB: Jul 31, 1945 DOA: 12-03-2017  PCP: Mellody Dance, DO PCP/Office notified: no  Admit date: 12/03/2017 Date of Death: 12/19/17  Final Diagnoses:  Principal Problem:   Closed right hip fracture, initial encounter ([name]) Active Problems:   Hypertension   CAD (coronary artery disease)   Mood disorder ([place])- mixed PTSD and depression   Type 2 diabetes mellitus with complication (H[facility])   Chronic diastolic CHF (congestive heart failure) ([place])   Periprosthetic fracture around internal prosthetic hip joint   Palliative care encounter   Respiratory distress     History of present illness:   72 y.o. male past medical history of diabetes mellitus type 2, essential hypertension anxiety chronic narcotic use, CAD status post CABG in 5329, chronic diastolic heart failure hypothyroidism presents with right hip fracture following a mechanical fall.  Hospital Course:  He was admitted to the hospital due to his mechanical fall, right hip hemiarthroplasty was performed, he developed acute blood loss during surgery and required 3 units of packed red blood cells.  He remained euvolemic.  Remains very anxious and depressed. He developed A. fib with RVR, also developed ileus after surgery after long discussion with him and his wife they have been back and forth between comfort care and aggressive treatment.  But on 92/05/2681 due to his complication and obvious deterioration.  Overnight the patient deteriorated his ileus progressively got worse he was started on a morphine drip.   Time: 35 min  Signed:  Charlynne Cousins  [facility] 12-19-2017, 1:00 PM

## 2017-12-17 NOTE — Progress Notes (Signed)
Izora Ribas ME notified of pt death by Lamar Blinks, NP.  Pt will be a ME case.  Pt will be sent to morgue according to policy

## 2017-12-17 DEATH — deceased

## 2018-01-05 ENCOUNTER — Ambulatory Visit: Payer: Medicare Other | Admitting: Family Medicine

## 2018-01-18 ENCOUNTER — Ambulatory Visit: Payer: Medicare Other | Admitting: Oncology

## 2018-01-18 ENCOUNTER — Other Ambulatory Visit: Payer: Medicare Other
